# Patient Record
Sex: Female | Born: 1950
Health system: Southern US, Community
[De-identification: ages and names within clinical notes are randomized; demographics above are authoritative.]

## PROBLEM LIST (undated history)

## (undated) DIAGNOSIS — I739 Peripheral vascular disease, unspecified: Secondary | ICD-10-CM

## (undated) DIAGNOSIS — F32A Depression, unspecified: Secondary | ICD-10-CM

## (undated) DIAGNOSIS — R87619 Unspecified abnormal cytological findings in specimens from cervix uteri: Secondary | ICD-10-CM

## (undated) DIAGNOSIS — F329 Major depressive disorder, single episode, unspecified: Secondary | ICD-10-CM

## (undated) DIAGNOSIS — N189 Chronic kidney disease, unspecified: Secondary | ICD-10-CM

## (undated) DIAGNOSIS — R4781 Slurred speech: Secondary | ICD-10-CM

## (undated) DIAGNOSIS — D649 Anemia, unspecified: Secondary | ICD-10-CM

## (undated) DIAGNOSIS — E119 Type 2 diabetes mellitus without complications: Secondary | ICD-10-CM

## (undated) DIAGNOSIS — I82409 Acute embolism and thrombosis of unspecified deep veins of unspecified lower extremity: Secondary | ICD-10-CM

## (undated) DIAGNOSIS — E785 Hyperlipidemia, unspecified: Secondary | ICD-10-CM

## (undated) DIAGNOSIS — I10 Essential (primary) hypertension: Secondary | ICD-10-CM

## (undated) HISTORY — DX: Unspecified abnormal cytological findings in specimens from cervix uteri: R87.619

## (undated) HISTORY — DX: Type 2 diabetes mellitus without complications: E11.9

## (undated) HISTORY — DX: Hyperlipidemia, unspecified: E78.5

## (undated) HISTORY — DX: Chronic kidney disease, unspecified: N18.9

## (undated) HISTORY — DX: Slurred speech: R47.81

## (undated) HISTORY — PX: ANGIOPLASTY / STENTING FEMORAL: SUR30

---
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
## 1978-01-21 HISTORY — PX: CARPAL TUNNEL RELEASE: SHX101

## 1985-01-21 HISTORY — PX: CHOLECYSTECTOMY: SHX55

## 1986-01-21 HISTORY — PX: BRAIN MENINGIOMA EXCISION: SHX576

## 1987-01-22 HISTORY — PX: BREAST BIOPSY: SHX20

## 2000-01-23 LAB — HM COLONOSCOPY: HM Colonoscopy: NORMAL

## 2007-01-22 HISTORY — PX: KNEE ARTHROPLASTY: SHX992

## 2010-10-17 ENCOUNTER — Ambulatory Visit: Payer: Self-pay | Admitting: Family Medicine

## 2010-10-19 ENCOUNTER — Ambulatory Visit: Payer: Self-pay | Admitting: Internal Medicine

## 2011-02-04 ENCOUNTER — Ambulatory Visit: Payer: Self-pay | Admitting: Internal Medicine

## 2011-02-12 LAB — HM MAMMOGRAPHY: HM MAMMO: NORMAL

## 2011-05-15 ENCOUNTER — Ambulatory Visit: Payer: Self-pay | Admitting: Vascular Surgery

## 2011-05-15 LAB — CREATININE, SERUM: Creatinine: 1.54 mg/dL — ABNORMAL HIGH (ref 0.60–1.30)

## 2011-05-21 ENCOUNTER — Ambulatory Visit: Payer: Self-pay | Admitting: Vascular Surgery

## 2011-05-21 LAB — BASIC METABOLIC PANEL
BUN: 18 mg/dL (ref 7–18)
Co2: 28 mmol/L (ref 21–32)
Creatinine: 0.98 mg/dL (ref 0.60–1.30)
EGFR (African American): >60
EGFR (Non-African Amer.): >60
Glucose: 112 mg/dL — ABNORMAL HIGH (ref 65–99)
Osmolality: 280 (ref 275–301)
Potassium: 4.9 mmol/L (ref 3.5–5.1)
Sodium: 139 mmol/L (ref 136–145)

## 2012-04-14 ENCOUNTER — Ambulatory Visit: Payer: Self-pay | Admitting: Vascular Surgery

## 2012-04-14 LAB — BASIC METABOLIC PANEL
BUN: 27 mg/dL — ABNORMAL HIGH (ref 7–18)
Co2: 27 mmol/L (ref 21–32)
EGFR (African American): 59 — ABNORMAL LOW
Osmolality: 280 (ref 275–301)
Sodium: 137 mmol/L (ref 136–145)

## 2013-04-27 ENCOUNTER — Ambulatory Visit: Payer: Self-pay | Admitting: Vascular Surgery

## 2013-04-27 LAB — BASIC METABOLIC PANEL
ANION GAP: 6 — AB (ref 7–16)
BUN: 21 mg/dL — ABNORMAL HIGH (ref 7–18)
CHLORIDE: 108 mmol/L — AB (ref 98–107)
CO2: 25 mmol/L (ref 21–32)
Calcium, Total: 9.3 mg/dL (ref 8.5–10.1)
Creatinine: 1.28 mg/dL (ref 0.60–1.30)
EGFR (African American): 52 — ABNORMAL LOW
GFR CALC NON AF AMER: 44 — AB
GLUCOSE: 137 mg/dL — AB (ref 65–99)
OSMOLALITY: 283 (ref 275–301)
POTASSIUM: 4.2 mmol/L (ref 3.5–5.1)
Sodium: 139 mmol/L (ref 136–145)

## 2014-01-10 ENCOUNTER — Ambulatory Visit: Payer: Self-pay | Admitting: Internal Medicine

## 2014-01-10 LAB — BASIC METABOLIC PANEL
BUN: 19 mg/dL (ref 4–21)
CREATININE: 1.4 mg/dL — AB (ref <=1.1)

## 2014-01-10 LAB — LIPID PANEL
CHOLESTEROL: 171 mg/dL (ref 0–200)
HDL: 51 mg/dL (ref 35–70)
LDL CALC: 67 mg/dL
Triglycerides: 264 mg/dL — AB (ref 40–160)

## 2014-01-10 LAB — HEMOGLOBIN A1C: Hgb A1c MFr Bld: 6.4 % — AB (ref 4.0–6.0)

## 2014-01-10 LAB — CBC AND DIFFERENTIAL: HEMOGLOBIN: 14.3 g/dL (ref 12.0–16.0)

## 2014-01-10 LAB — TSH: TSH: 1.5 u[IU]/mL (ref <=5.90)

## 2014-01-31 DIAGNOSIS — M1990 Unspecified osteoarthritis, unspecified site: Secondary | ICD-10-CM | POA: Insufficient documentation

## 2014-05-13 NOTE — Op Note (Signed)
PATIENT NAME:  Misty Villarreal, Misty Villarreal MR#:  [id number] DATE OF BIRTH:  07-01-1950  DATE OF PROCEDURE:  04/14/2012  PREOPERATIVE DIAGNOSIS: Atherosclerotic occlusive disease, bilateral lower extremities with rest pain of the left lower extremity.   POSTOPERATIVE DIAGNOSIS: Atherosclerotic occlusive disease of bilateral lower extremities with rest pain of the right lower extremity.   PROCEDURES PERFORMED: 1.  Abdominal aortogram.  2.  Left lower extremity distal runoff, third order catheter placement.  3.  Percutaneous transluminal angioplasty of the left superficial femoral artery.   SURGEON: Katha Cabal, M.D.   SEDATION:  Versed and fentanyl IV. Continuous ECG, pulse oximetry and cardiopulmonary monitoring is performed throughout the entire procedure by the interventional radiology nurse.   TOTAL SEDATION TIME:  One hour, 20 minutes.   ACCESS: 6 French sheath, right superficial femoral artery.   FLUOROSCOPY TIME: 8.8 minutes.   CONTRAST USED: 110 mL Isovue.   INDICATIONS: The patient presented with increasing pain in her lower extremity and worsening of her noninvasive studies as well as deterioration of her physical examination. She wished for improvement in her perfusion and noted not only pains during the evening, but also limitation of her abilities to perform her daily activities. Risks and benefits for angiography and intervention were reviewed. All questions answered. The patient agrees to proceed.   DESCRIPTION OF PROCEDURE: The patient is taken to special procedures and placed in the supine position. After adequate sedation is achieved, both groins are prepped and draped in a sterile fashion. Ultrasound is placed in a sterile sleeve. Ultrasound is utilized secondary to lack of appropriate landmarks and to avoid vascular injury. Under direct ultrasound visualization, the common femoral artery is identified and the femoral artery and the femoral bifurcation are also identified. The  site above the apparent bifurcation is selected and access is obtained with a micropuncture needle. Images recorded for the permanent record. A J-wire is advanced followed by a 5 [place] sheath and 5 French pigtail catheter. Pigtail catheter is positioned in the level of T12. An AP projection of the aorta is obtained. Pigtail catheter is then repositioned and an RAO view of the pelvis is obtained. A stiff angled Glidewire and pigtail catheter are used to cross the bifurcation and negotiated down into the common femoral and subsequently the superficial femoral artery. Distal runoff is then obtained. After review of the images, 4000 units of heparin is given. Stiff angled Glidewire is reintroduced and a 6 [place] Ansel sheath is advanced and exchange for the 5 [place] sheath up and over the bifurcation and the tip of the Ansel sheath is positioned in the mid common femoral on the left. Straight glide catheter and Glidewire are then negotiated through the multiple diffuse lesions throughout the left SFA, including a 3 cm occlusion. The catheter is advanced down into the mid popliteal. Hand injection of contrast through the catheter is then utilized to complete distal runoff. The posterior tibial appears patent to the foot. Anterior tibial is also patent to the foot, but has significantly  slower flow then the posterior tibial. The peroneal is quite small and not likely to contribute significantly to the distal perfusion.   A wire is then reintroduced and initially a 4 x 25 [place] balloon is used to angioplasty the SFA beginning at the level of the femoral condyles and working all the way to back to the common femoral. Two separate inflations were required. Follow-up demonstrates that the recanalization is under sized and a 5 x 25 [place] balloon is advanced  across the lesions, inflated to 16 atmospheres. Inflations are for one minute each. Follow-up angiography demonstrates that there is now excellent revascularization.  There are 2 areas; one more proximally and one in the distal SFA proximal popliteal which do have dissection. They are not flow limiting and in fact, oblique views both left and right are obtained of these areas to ensure that the perfusion is adequate.   The sheath is then pulled into the right and an oblique image is obtained. This appears to show that the stick is actually in the SFA, not the common femoral. The wire is introduced and the sheath is slowly backed out puffing contrast and ultimately the point of extravasation is located at the SFA and not the common femoral. Therefore, the sheath is reintroduced using the dilator over the wire. The wire and dilator are removed and ACT is obtained, which is 170. The patient is then brought out to the holding area and the sheath is removed and manual pressure is held for 20 minutes. There are no additional complications.   INTERPRETATION: Initial views demonstrate the aorta is patent. There is moderate disease at the proximal right common iliac; however, this does not appear to achieve hemodynamic significance. The left common iliac is widely patent. Previously placed stent on the right iliac system is widely patent, as is the external iliac on the left. The common and external iliac arteries are widely patent.   The left common femoral and profunda femoris are patent and the profunda collateralize is moderately well. Superficial femoral artery is patent in its proximal one third, but quite small with areas of near occlusion in the midportion. It does include over several segments. There is a previously placed stent at the level of Hunter's canal and this is also heavily restenotic. The proximal popliteal is also diseased; however, the mid and distal popliteal are widely patent. Trifurcation as noted above. The anterior tibial,  posterior tibial are patent to the foot, although the flow was more rapid through the posterior tibial and this appears to be the  dominant vessel. The peroneal is small and does not contribute significantly.   Following angioplasty to 4 mm, the SFA is under sized and therefore a 5 mm dilatation is performed. This achieves an excellent appearance to the SFA throughout its entire course including the origin and the previously placed stented segment. There are two areas of dissection, but these not flow-limiting and multiple oblique views are obtained to ensure this.   SUMMARY: Successful revascularization of the left lower extremity with recanalization of the superficial femoral artery as described above.    ____________________________ Katha Cabal, MD ggs:cc D: 04/15/2012 21:00:16 ET T: 04/15/2012 21:42:25 ET JOB#: [id number]  cc: Katha Cabal, MD, <Dictator> Halina Maidens, MD Katha Cabal MD ELECTRONICALLY SIGNED 04/21/2012 17:20

## 2014-05-14 NOTE — Op Note (Signed)
PATIENT NAME:  Misty, Villarreal MR#:  [id number] DATE OF BIRTH:  30-Oct-1950  DATE OF PROCEDURE:  04/27/2013  PREOPERATIVE DIAGNOSES:  1.  Atherosclerotic occlusive disease, bilateral lower extremities with claudication, left lower extremity.  2.  Complication of vascular device with in-stent restenosis, left superficial femoral artery.  3.  Hypertension.  4.  Hypercholesterolemia.   POSTOPERATIVE DIAGNOSES: 1.  Atherosclerotic occlusive disease, bilateral lower extremities with claudication, left lower extremity.  2.  Complication of vascular device with in-stent restenosis, left superficial femoral artery.  3.  Hypertension.  4.  Hypercholesterolemia.   PROCEDURES PERFORMED:  1.  Abdominal aortogram.  2.  Left lower extremity distal runoff, third order catheter placement.   SURGEON: Hortencia Pilar, M.D.   SEDATION: Versed 5 mg plus fentanyl 200 mcg administered IV. Continuous ECG, pulse oximetry and cardiopulmonary monitoring is performed throughout the entire procedure by the interventional radiology nurse. Total sedation time is 1 hour.   ACCESS: A 6-French sheath, right common femoral artery.   FLUOROSCOPY TIME: 19.6 minutes.   CONTRAST USED: Isovue 90 mL.   INDICATIONS: Misty Villarreal is a 64 year old woman, who presents for her followup at the office noting some moderate claudication. She does acknowledge lifestyle limitations, but denies rest pain. There are no open wounds sores. Noninvasive studies demonstrated a high-grade in-stent restenosis. There was suggestion of a short segment very focal occlusion within the stent, but continuous flow distally. Risks and benefits for angiography with the hope for intervention and salvage of the existing previous intervention was reviewed. All questions answered. The patient agreed to proceed.   DESCRIPTION OF PROCEDURE: The patient is taken to special procedures and placed in the supine position. After adequate sedation is achieved, both  groins are prepped and draped in sterile fashion. Ultrasound is placed in a sterile sleeve. Ultrasound is utilized secondary to a lack of appropriate landmarks and to avoid vascular injury. Under direct visualization, the common femoral artery is identified. It is scanned down to demonstrate the femoral bifurcation and then scanned more proximally noting some posterior plaque. An area just above or just proximal to the posterior plaque is selected. Lidocaine 1% is infiltrated under ultrasound guidance, and then access is obtained under direct ultrasound visualization. The artery is echolucent and pulsatile indicating patency and an image is recorded. Microwire is then advanced by micro sheath, J-wire followed by a 5-French sheath. On fluoroscopy, the wire catheter is noted to go into a side branch and the short J-wire is then exchanged for a stiff angled Glidewire. A second floppy Glidewire is then advanced through the 5-French sheath and the 5-French sheath slowly pulled back until it is once again in the common femoral artery and the floppy Glidewire is negotiated under fluoroscopy into the aorta. Stiff angled Glidewire is then removed. The dilator is then introduced over the floppy guidewire and the sheath is advanced so that the tip is in the external iliac artery in the proper location. Pigtail catheter is then advanced over the Glidewire and positioned at the level of T12. AP projection of the aorta is obtained.   The pigtail catheter is repositioned to above the bifurcation and bilateral oblique views of the pelvis are obtained.   Using the pigtail catheter stiff angled Glidewire, the aortic bifurcation is crossed and the catheter is advanced down to the external iliac where an LAO projection is obtained. Catheter and wire combination is then negotiated into, what is essentially, a string sign of the SFA, which begins at the origin.  The catheter itself is occlusive within the SFA and distal runoff is  obtained.   Imaging carried more distally demonstrates that within the stent at approximately the midportion, there is occlusion, however unlike the ultrasound this occlusion extends down to the level of the tibial plateau essentially for the proximal two thirds of the popliteal. The mid distal popliteal is reconstituted and there is three-vessel runoff to the foot at the level of the trifurcation dominant vessel to the foot is the posterior tibial. Anterior tibial is poorly visualized at the level of the ankle.   A stiff angled Glidewire is reintroduced and the pigtail catheter is removed. A 5-French sheath is exchanged for a 6-French Ansell and a straight slip catheter is then utilized. Attempts at crossing the occlusion are made with angled Glidewire, Magic torque wire, straight slip catheter as well as a straight CSI catheter. Unfortunately, the true lumen could not be re-entered. Hand injection of contrast from the sheath in the common femoral location demonstrates the profunda collaterals are all well preserved and given her claudication symptoms without evidence of tissue loss, no further interventions or attempts at crossing are made.    The sheath is pulled back into the right common iliac artery where magnified oblique views of the right common iliac are obtained. There is  plaque noted at its origin, but this represents approximately a 30% stenosis and given her distal disease bilaterally and a hemodynamically significant lesion, I did not feel placing a stent at this location and potentially barring future interventions was appropriate at this time.   Oblique view of the right groin is then obtained and a StarClose device deployed successfully. There are no immediate complications.   INTERPRETATION: The abdominal aorta is opacified with a bolus injection of contrast. There is diffuse calcifications noted, but there are no hemodynamically significant stenoses. On the initial AP views, there does  appear to be a greater than 60% narrowing of the right common iliac at its origin. The left common iliac appears widely patent. Later in the case, magnified imaging demonstrates there is a calcified lesion here, but it does not appear to be hemodynamically significant.   The external iliac arteries appear patent bilaterally.   The left common femoral and profunda femoris are widely patent. Superficial femoral artery has a string sign measuring proximally 1 mm to 1.5 mm throughout its entire course beginning at the origin and extending down to the mid stent, where there is an occlusion that extends over the course of approximately 6 to 8 cm. Distally, the popliteal does reconstitute and the trifurcation is patent. The posterior tibial is widely patent down to the foot and fills the plantar arch and measures 2 to 2.5 mm throughout its course and is the dominant vessel. Anterior tibial is patent proximally, but then is poorly visualized distally. It does not appear to fill the dorsalis pedis well. Peroneal is patent down to the ankle.   SUMMARY: Occlusion of the distal superficial femoral artery and proximal popliteal. Unsuccessful attempt at crossing with catheter and wire the occluded segment. Given her claudication symptoms, no further interventions or surgery at this time; however, if her condition does worsen or she develops rest pain or tissue loss, then reattempting crossing is feasible; however, femoral to below-knee popliteal bypass is also a good option in the face of more extreme symptoms.   ____________________________ Katha Cabal, MD ggs:aw D: 04/27/2013 09:58:16 ET T: 04/27/2013 10:20:09 ET JOB#: [id number]  cc: Katha Cabal, MD, <Dictator>  Halina Maidens, MD Katha Cabal MD ELECTRONICALLY SIGNED 05/11/2013 11:16

## 2014-05-15 NOTE — Op Note (Signed)
PATIENT NAME:  Misty Villarreal, Misty Villarreal MR#:  [id number] DATE OF BIRTH:  December 21, 1950  DATE OF PROCEDURE:  05/21/2011  PREOPERATIVE DIAGNOSIS: Atherosclerotic occlusive disease of bilateral lower extremities with rest pain and ischemic embolization of the left fifth toe.   POSTOPERATIVE DIAGNOSIS: Atherosclerotic occlusive disease of bilateral lower extremities with rest pain and ischemic embolization of the left fifth toe.   PROCEDURE PERFORMED:   1. Left lower extremity runoff, third order catheter placement.  2. Percutaneous transluminal angioplasty and stent placement, left superficial femoral artery.   SURGEON: Katha Cabal, MD   SEDATION: Versed 3 mg plus fentanyl 100 mcg administered IV. Continuous ECG, pulse oximetry and cardiopulmonary monitoring was performed throughout the entire procedure by the Interventional Radiology nurse. Total sedation time was 45 minutes.   ACCESS: 6-French sheath, right common femoral artery.   FLUOROSCOPY TIME:  3.4 minutes.   CONTRAST USED: Isovue 70 mL.   INDICATIONS: Misty Villarreal is a 64 year old woman who presented to the office with the abrupt onset of bluish discoloration and acute pain in the left fifth toe. Physical examination as well as CT angiography of the chest and abdomen did not demonstrate a culprit lesion, and therefore she is undergoing angiography of the left lower extremity. The risks and benefits were reviewed. All questions are answered. The patient agrees to proceed.   DESCRIPTION OF PROCEDURE: The patient is taken to the Special Procedures Suite and placed in the supine position. After adequate sedation is achieved, ultrasound is placed in a sterile sleeve. Ultrasound is utilized secondary to lack of appropriate landmarks to avoid vascular injury. The common femoral artery is identified. It is echolucent, homogeneous, and pulsatile, indicating patency. Image is recorded for the permanent record. Under direct ultrasound visualization, a  micropuncture needle is inserted, a microwire followed by a micro sheath. A J-wire is then advanced with a pigtail catheter. The pigtail catheter is used to hook the aortic bifurcation, and the J-wire is advanced along with the pigtail catheter into the proximal SFA. Oblique view of the left groin is then obtained demonstrating the common femoral and the femoral bifurcation. The image intensifier is then returned to the AP projection and distal runoff is obtained. Approximately 65 to 75% stenosis within significant ulceration is identified in the SFA at Hunter's canal, and 3000 units of heparin is given. A Magic Torque Wire is then advanced across the lesion with the tip positioned in the distal popliteal, and a 6 x 60 LifeStent is deployed across this area and postdilated to 5 mm. A second angioplasty is performed slightly more proximally but no stent is deployed at this location.   Follow-up angiography demonstrates a widely patent SFA with minimal evidence of atherosclerotic plaque formation and no evidence of residual stenosis at the treated site. Distal runoff is three vessels down to the foot.   The sheath is pulled back into the right side and a StarClose device deployed successfully after an oblique image was obtained. There were no immediate complications.   INTERPRETATION: Left lower extremity distal runoff is widely patent with the significant exception of the ulcerated plaque in Hunter's canal. This is treated with angioplasty and stent placement using an Edwards LifeStent 6 x 60. Distal runoff is preserved.   SUMMARY: Successful treatment of ulcerated lesion which is the most probable cause of the embolic event to the patient's fifth toe on the left foot.    ____________________________ Katha Cabal, MD ggs:cbb D: 05/21/2011 18:38:51 ET T: 05/22/2011 10:00:28 ET JOB#: [id number]  cc: Katha Cabal, MD, <Dictator> Halina Maidens, MD Katha Cabal MD ELECTRONICALLY SIGNED  05/31/2011 7:52

## 2014-07-05 DIAGNOSIS — M4316 Spondylolisthesis, lumbar region: Secondary | ICD-10-CM | POA: Insufficient documentation

## 2014-09-01 ENCOUNTER — Encounter: Payer: Self-pay | Admitting: Internal Medicine

## 2014-09-01 ENCOUNTER — Other Ambulatory Visit: Payer: Self-pay | Admitting: Internal Medicine

## 2014-09-01 DIAGNOSIS — E139 Other specified diabetes mellitus without complications: Secondary | ICD-10-CM | POA: Insufficient documentation

## 2014-09-01 DIAGNOSIS — E119 Type 2 diabetes mellitus without complications: Secondary | ICD-10-CM | POA: Insufficient documentation

## 2014-09-01 DIAGNOSIS — R87619 Unspecified abnormal cytological findings in specimens from cervix uteri: Secondary | ICD-10-CM

## 2014-09-01 DIAGNOSIS — E785 Hyperlipidemia, unspecified: Secondary | ICD-10-CM

## 2014-09-01 DIAGNOSIS — I8390 Asymptomatic varicose veins of unspecified lower extremity: Secondary | ICD-10-CM | POA: Insufficient documentation

## 2014-09-01 DIAGNOSIS — E1151 Type 2 diabetes mellitus with diabetic peripheral angiopathy without gangrene: Secondary | ICD-10-CM

## 2014-09-01 DIAGNOSIS — E1169 Type 2 diabetes mellitus with other specified complication: Secondary | ICD-10-CM | POA: Insufficient documentation

## 2014-09-01 DIAGNOSIS — N289 Disorder of kidney and ureter, unspecified: Secondary | ICD-10-CM | POA: Insufficient documentation

## 2014-09-01 DIAGNOSIS — G629 Polyneuropathy, unspecified: Secondary | ICD-10-CM | POA: Insufficient documentation

## 2014-09-01 DIAGNOSIS — I10 Essential (primary) hypertension: Secondary | ICD-10-CM | POA: Insufficient documentation

## 2014-09-01 HISTORY — DX: Unspecified abnormal cytological findings in specimens from cervix uteri: R87.619

## 2014-09-13 ENCOUNTER — Other Ambulatory Visit: Payer: Self-pay | Admitting: Internal Medicine

## 2014-09-13 DIAGNOSIS — I739 Peripheral vascular disease, unspecified: Secondary | ICD-10-CM | POA: Insufficient documentation

## 2014-09-13 DIAGNOSIS — N183 Chronic kidney disease, stage 3 unspecified: Secondary | ICD-10-CM | POA: Insufficient documentation

## 2014-09-29 ENCOUNTER — Encounter: Payer: Self-pay | Admitting: Internal Medicine

## 2014-09-29 ENCOUNTER — Ambulatory Visit (INDEPENDENT_AMBULATORY_CARE_PROVIDER_SITE_OTHER): Payer: BLUE CROSS/BLUE SHIELD | Admitting: Internal Medicine

## 2014-09-29 VITALS — BP 122/64 | HR 76 | Ht 62.0 in | Wt 177.8 lb

## 2014-09-29 DIAGNOSIS — I10 Essential (primary) hypertension: Secondary | ICD-10-CM | POA: Diagnosis not present

## 2014-09-29 DIAGNOSIS — E1159 Type 2 diabetes mellitus with other circulatory complications: Secondary | ICD-10-CM

## 2014-09-29 DIAGNOSIS — M79641 Pain in right hand: Secondary | ICD-10-CM | POA: Diagnosis not present

## 2014-09-29 DIAGNOSIS — E1151 Type 2 diabetes mellitus with diabetic peripheral angiopathy without gangrene: Secondary | ICD-10-CM

## 2014-09-29 DIAGNOSIS — E784 Other hyperlipidemia: Secondary | ICD-10-CM

## 2014-09-29 DIAGNOSIS — E7849 Other hyperlipidemia: Secondary | ICD-10-CM

## 2014-09-29 DIAGNOSIS — N183 Chronic kidney disease, stage 3 unspecified: Secondary | ICD-10-CM

## 2014-09-29 NOTE — Progress Notes (Signed)
Date:  09/29/2014   Name:  Misty Villarreal   DOB:  [birth year]   MRN:  [id number]   Chief Complaint: Diabetes; Hypertension; and Hyperlipidemia Diabetes She presents for her follow-up diabetic visit. She has type 2 diabetes mellitus. Her disease course has been improving. Pertinent negatives for hypoglycemia include no headaches or tremors. Pertinent negatives for diabetes include no chest pain, no fatigue and no weakness. Current diabetic treatment includes oral agent (dual therapy). She is following a diabetic diet. There is no change in her home blood glucose trend. Her breakfast blood glucose is taken between 7-8 am. Her breakfast blood glucose range is generally 110-130 mg/dl. An ACE inhibitor/angiotensin II receptor blocker is being taken.  Hypertension This is a chronic problem. The current episode started more than 1 year ago. The problem is unchanged. The problem is controlled. Pertinent negatives include no chest pain, headaches, orthopnea, palpitations or shortness of breath. The current treatment provides significant improvement. There are no compliance problems.  Identifiable causes of hypertension include chronic renal disease.  Hyperlipidemia This is a chronic problem. The current episode started more than 1 year ago. The problem is controlled. Recent lipid tests were reviewed and are normal. Exacerbating diseases include chronic renal disease and diabetes. Pertinent negatives include no chest pain or shortness of breath.  Hand Pain  There was no injury mechanism. The pain is present in the right fingers. The quality of the pain is described as aching (And numbness. She also has some thickening of the palmar fascia beneath the ring finger. She is decreased sensation in the distal fourth and fifth fingers.). The pain does not radiate. The pain has been constant since the incident. Associated symptoms include numbness. Pertinent negatives include no chest pain.     Review of  Systems:  Review of Systems  Constitutional: Negative for fever, chills and fatigue.  Respiratory: Negative for cough, choking and shortness of breath.   Cardiovascular: Negative for chest pain, palpitations, orthopnea and leg swelling.  Genitourinary: Frequency: in lateral right hand   Musculoskeletal: Positive for arthralgias.  Neurological: Positive for numbness. Negative for tremors, weakness and headaches.    Patient Active Problem List   Diagnosis Date Noted  . Chronic kidney disease (CKD), stage III (moderate) 09/13/2014  . Angiopathy, peripheral 09/13/2014  . Familial multiple lipoprotein-type hyperlipidemia 09/01/2014  . Clinical depression 09/01/2014  . Neuropathy 09/01/2014  . Phlebectasia 09/01/2014  . Abnormal glandular Papanicolaou smear of cervix 09/01/2014  . Impaired renal function 09/01/2014  . DM (diabetes mellitus), type 2 with peripheral vascular complications 95/62/1308  . Essential (primary) hypertension 09/01/2014  . Arthritis, degenerative 01/31/2014    Prior to Admission medications   Medication Sig Start Date End Date Taking? Authorizing Provider  aspirin 81 MG tablet Take 1 tablet by mouth daily.   Yes Historical Provider, MD  clopidogrel (PLAVIX) 75 MG tablet Take 1 tablet by mouth daily.   Yes Historical Provider, MD  fexofenadine (ALLEGRA) 180 MG tablet Take 180 mg by mouth daily.   Yes Historical Provider, MD  lisinopril-hydrochlorothiazide (PRINZIDE,ZESTORETIC) 20-12.5 MG per tablet Take 1 tablet by mouth daily. 01/10/14  Yes Historical Provider, MD  metFORMIN (GLUCOPHAGE) 500 MG tablet TAKE 1 TABLET TWICE A DAY 09/01/14  Yes Glean Hess, MD  MULTIPLE VITAMIN PO Take by mouth.   Yes Historical Provider, MD  simvastatin (ZOCOR) 80 MG tablet TAKE 1 TABLET AT BEDTIME 09/01/14  Yes Glean Hess, MD    Allergies  Allergen Reactions  . Amoxicillin-Pot  Clavulanate Diarrhea  . Tetracycline     Other reaction(s): emesis  . Cefaclor Rash  .  Cephalexin Rash  . Sulfa Antibiotics Rash    Other reaction(s): emesis    Past Surgical History  Procedure Laterality Date  . Brain meningioma excision  1988  . Knee arthroplasty Right 2009  . Carpal tunnel release Left 1980  . Cholecystectomy  1987  . Breast biopsy Right 1989    benign  . Angioplasty / stenting femoral Left 2014, 2013    Social History  Substance Use Topics  . Smoking status: Former Research scientist (life sciences)  . Smokeless tobacco: Former Systems developer    Quit date: 12/21/2013  . Alcohol Use: 1.2 oz/week    2 Standard drinks or equivalent per week     Medication list has been reviewed and updated.  Physical Examination:  Physical Exam  Constitutional: She is oriented to person, place, and time. She appears well-developed. No distress.  HENT:  Head: Normocephalic and atraumatic.  Eyes: Conjunctivae are normal. Right eye exhibits no discharge. Left eye exhibits no discharge. No scleral icterus.  Cardiovascular: Regular rhythm and S1 normal.   Pulses:      Dorsalis pedis pulses are 1+ on the right side, and 1+ on the left side.  Pulmonary/Chest: Effort normal and breath sounds normal. No respiratory distress. She has no wheezes.  Musculoskeletal: Normal range of motion.       Arms: Neurological: She is alert and oriented to person, place, and time.  Skin: Skin is warm and dry. No rash noted.  Psychiatric: She has a normal mood and affect. Her behavior is normal. Thought content normal.    BP 122/64 mmHg  Pulse 76  Ht 5\' 2"  (1.575 m)  Wt 177 lb 12.8 oz (80.65 kg)  BMI 32.51 kg/m2  Assessment and Plan: 1. Essential (primary) hypertension Controlled continue current medications  2. DM (diabetes mellitus), type 2 with peripheral vascular complications Doing well on oral agents. Patient encouraged to do regular exercise such as walking to improve circulation - Hemoglobin A1c  3. Chronic kidney disease (CKD), stage III (moderate) Continue to follow-up with [facility]  nephrology  4. Familial multiple lipoprotein-type hyperlipidemia Continue statin therapy  5. Hand pain, right Refer to orthopedic hand specialist   Halina Maidens, MD [facility]  09/29/2014

## 2014-09-29 NOTE — Patient Instructions (Signed)
Call [facility])   Hand specialist  - Dr. Astrid Divine or who ever is available

## 2014-09-30 LAB — HEMOGLOBIN A1C
Est. average glucose Bld gHb Est-mCnc: 146 mg/dL
HEMOGLOBIN A1C: 6.7 % — AB (ref 4.8–5.6)

## 2014-11-28 ENCOUNTER — Other Ambulatory Visit: Payer: Self-pay | Admitting: Internal Medicine

## 2015-01-30 ENCOUNTER — Ambulatory Visit: Payer: BLUE CROSS/BLUE SHIELD | Admitting: Internal Medicine

## 2015-02-02 ENCOUNTER — Encounter: Payer: Self-pay | Admitting: Internal Medicine

## 2015-02-02 ENCOUNTER — Ambulatory Visit (INDEPENDENT_AMBULATORY_CARE_PROVIDER_SITE_OTHER): Payer: Medicare Other | Admitting: Internal Medicine

## 2015-02-02 VITALS — BP 104/62 | HR 60 | Ht 62.0 in | Wt 180.0 lb

## 2015-02-02 DIAGNOSIS — E1151 Type 2 diabetes mellitus with diabetic peripheral angiopathy without gangrene: Secondary | ICD-10-CM | POA: Diagnosis not present

## 2015-02-02 DIAGNOSIS — Z1239 Encounter for other screening for malignant neoplasm of breast: Secondary | ICD-10-CM

## 2015-02-02 DIAGNOSIS — I10 Essential (primary) hypertension: Secondary | ICD-10-CM

## 2015-02-02 DIAGNOSIS — N183 Chronic kidney disease, stage 3 unspecified: Secondary | ICD-10-CM

## 2015-02-02 DIAGNOSIS — I739 Peripheral vascular disease, unspecified: Secondary | ICD-10-CM | POA: Insufficient documentation

## 2015-02-02 MED ORDER — LISINOPRIL-HYDROCHLOROTHIAZIDE 20-12.5 MG PO TABS: 1.0000 | ORAL_TABLET | Freq: Every day | ORAL | Status: DC

## 2015-02-02 NOTE — Progress Notes (Signed)
Date:  02/02/2015   Name:  Misty Villarreal   DOB:  1950/07/06   MRN:  [id number]   Chief Complaint: Diabetes and Hypertension Diabetes She presents for her follow-up diabetic visit. She has type 2 diabetes mellitus. Pertinent negatives for hypoglycemia include no headaches or tremors. Pertinent negatives for diabetes include no chest pain, no fatigue, no polydipsia and no polyuria. Diabetic complications include PVD. Current diabetic treatment includes oral agent (monotherapy). She is compliant with treatment all of the time. Her weight is stable. There is no change in her home blood glucose trend. Her breakfast blood glucose is taken between 7-8 am. Her breakfast blood glucose range is generally 130-140 mg/dl.  Hypertension This is a chronic problem. The current episode started today. The problem is unchanged. The problem is controlled (occasionally SBP < 100 but without symptoms). Pertinent negatives include no chest pain, headaches, palpitations or shortness of breath. Risk factors for coronary artery disease include diabetes mellitus. Past treatments include ACE inhibitors and diuretics. The current treatment provides significant improvement. Hypertensive end-organ damage includes kidney disease and PVD.  renal insufficiency - patient is followed closely by [facility] nephrology. She has an appointment later this month. Per her report her renal insufficiency has been stable. She is avoiding nonsteroidals and keeping her blood pressure under control.  Review of Systems  Constitutional: Negative for fever, appetite change, fatigue and unexpected weight change.  HENT: Negative for tinnitus and trouble swallowing.   Eyes: Negative for visual disturbance.  Respiratory: Negative for cough, chest tightness and shortness of breath.   Cardiovascular: Negative for chest pain, palpitations and leg swelling.  Gastrointestinal: Negative for abdominal pain.  Endocrine: Negative for polydipsia and polyuria.    Genitourinary: Negative for dysuria and hematuria.  Musculoskeletal: Negative for arthralgias.  Neurological: Negative for tremors, numbness and headaches.  Psychiatric/Behavioral: Negative for dysphoric mood.    Patient Active Problem List   Diagnosis Date Noted  . Peripheral arterial occlusive disease ([place]) 02/02/2015  . Chronic kidney disease (CKD), stage III (moderate) 09/13/2014  . Angiopathy, peripheral ([place]) 09/13/2014  . Familial multiple lipoprotein-type hyperlipidemia 09/01/2014  . Clinical depression 09/01/2014  . Neuropathy ([place]) 09/01/2014  . Phlebectasia 09/01/2014  . Abnormal glandular Papanicolaou smear of cervix 09/01/2014  . DM (diabetes mellitus), type 2 with peripheral vascular complications ([place]) [id number]  . Essential (primary) hypertension 09/01/2014  . Arthritis, degenerative 01/31/2014    Prior to Admission medications   Medication Sig Start Date End Date Taking? Authorizing Provider  aspirin 81 MG tablet Take 1 tablet by mouth daily.   Yes Historical Provider, MD  clopidogrel (PLAVIX) 75 MG tablet Take 1 tablet by mouth daily.   Yes Historical Provider, MD  fexofenadine (ALLEGRA) 180 MG tablet Take 180 mg by mouth daily.   Yes Historical Provider, MD  lisinopril-hydrochlorothiazide (PRINZIDE,ZESTORETIC) 20-12.5 MG per tablet Take 1 tablet by mouth daily. 01/10/14  Yes Historical Provider, MD  metFORMIN (GLUCOPHAGE) 500 MG tablet TAKE 1 TABLET TWICE A DAY 11/28/14  Yes Glean Hess, MD  MULTIPLE VITAMIN PO Take by mouth.   Yes Historical Provider, MD  simvastatin (ZOCOR) 80 MG tablet TAKE 1 TABLET AT BEDTIME 11/28/14  Yes Glean Hess, MD    Allergies  Allergen Reactions  . Amoxicillin-Pot Clavulanate Diarrhea  . Tetracycline     Other reaction(s): emesis  . Cefaclor Rash  . Cephalexin Rash  . Sulfa Antibiotics Rash    Other reaction(s): emesis    Past Surgical History  Procedure Laterality  Date  . Brain meningioma excision  1988  . Knee  arthroplasty Right 2009  . Carpal tunnel release Left 1980  . Cholecystectomy  1987  . Breast biopsy Right 1989    benign  . Angioplasty / stenting femoral Left 2014, 2013    Social History  Substance Use Topics  . Smoking status: Former Research scientist (life sciences)  . Smokeless tobacco: Former Systems developer    Quit date: 12/21/2013  . Alcohol Use: 1.2 oz/week    2 Standard drinks or equivalent per week     Comment: occasional     Medication list has been reviewed and updated.   Physical Exam  Constitutional: She is oriented to person, place, and time. She appears well-developed and well-nourished. No distress.  HENT:  Head: Normocephalic and atraumatic.  Eyes: Conjunctivae are normal. Right eye exhibits no discharge. Left eye exhibits no discharge. No scleral icterus.  Neck: Normal range of motion. Neck supple. Carotid bruit is not present. No thyromegaly present.  Cardiovascular: Normal rate, regular rhythm and normal heart sounds.   Pulmonary/Chest: Effort normal and breath sounds normal. No respiratory distress.  Musculoskeletal: Normal range of motion.  Lymphadenopathy:    She has no cervical adenopathy.  Neurological: She is alert and oriented to person, place, and time. She has normal reflexes.  Skin: Skin is warm and dry. No rash noted.  Psychiatric: She has a normal mood and affect. Her speech is normal and behavior is normal. Thought content normal.    BP 104/62 mmHg  Pulse 60  Ht 5\' 2"  (1.575 m)  Wt 180 lb (81.647 kg)  BMI 32.91 kg/m2  Assessment and Plan: 1. Essential (primary) hypertension Controlled with occasionally low pressures but without symptoms Will not make any changes at this time - lisinopril-hydrochlorothiazide (PRINZIDE,ZESTORETIC) 20-12.5 MG tablet; Take 1 tablet by mouth daily.  Dispense: 90 tablet; Refill: 3  2. DM (diabetes mellitus), type 2 with peripheral vascular complications (HCC) Blood sugars are adequately controlled If A1c is higher will need to add other  agents - Hemoglobin A1c  3. Chronic kidney disease (CKD), stage III (moderate) Stable; followed by [facility] nephrology  4. Breast cancer screening - MM DIGITAL SCREENING BILATERAL; Future   Halina Maidens, MD [facility]  02/02/2015

## 2015-02-03 ENCOUNTER — Telehealth: Payer: Self-pay

## 2015-02-03 LAB — HEMOGLOBIN A1C
Est. average glucose Bld gHb Est-mCnc: 146 mg/dL
HEMOGLOBIN A1C: 6.7 % — AB (ref 4.8–5.6)

## 2015-02-03 NOTE — Telephone Encounter (Signed)
Spoke with pt. Pt. Advised of results and verbalized understanding. [facility]

## 2015-02-03 NOTE — Telephone Encounter (Signed)
-----   Message from Glean Hess, MD sent at 02/03/2015 12:11 PM EST ----- DM is stable.  Continue same medication.

## 2015-02-17 ENCOUNTER — Ambulatory Visit
Admission: RE | Admit: 2015-02-17 | Discharge: 2015-02-17 | Disposition: A | Discharge status: Home / Self Care | Payer: Medicare Other | Source: Ambulatory Visit | Attending: Internal Medicine | Admitting: Internal Medicine

## 2015-02-17 DIAGNOSIS — Z1231 Encounter for screening mammogram for malignant neoplasm of breast: Secondary | ICD-10-CM | POA: Insufficient documentation

## 2015-02-17 DIAGNOSIS — Z1239 Encounter for other screening for malignant neoplasm of breast: Secondary | ICD-10-CM

## 2015-05-10 ENCOUNTER — Encounter: Payer: Self-pay | Admitting: Internal Medicine

## 2015-05-10 DIAGNOSIS — E1129 Type 2 diabetes mellitus with other diabetic kidney complication: Secondary | ICD-10-CM | POA: Insufficient documentation

## 2015-06-21 ENCOUNTER — Encounter: Payer: Self-pay | Admitting: Internal Medicine

## 2015-06-21 ENCOUNTER — Ambulatory Visit (INDEPENDENT_AMBULATORY_CARE_PROVIDER_SITE_OTHER): Payer: Medicare Other | Admitting: Internal Medicine

## 2015-06-21 VITALS — BP 140/90 | HR 74 | Resp 16 | Ht 62.0 in | Wt 187.0 lb

## 2015-06-21 DIAGNOSIS — I890 Lymphedema, not elsewhere classified: Secondary | ICD-10-CM | POA: Insufficient documentation

## 2015-06-21 DIAGNOSIS — I10 Essential (primary) hypertension: Secondary | ICD-10-CM

## 2015-06-21 DIAGNOSIS — R600 Localized edema: Secondary | ICD-10-CM | POA: Diagnosis not present

## 2015-06-21 DIAGNOSIS — I479 Paroxysmal tachycardia, unspecified: Secondary | ICD-10-CM

## 2015-06-21 DIAGNOSIS — E784 Other hyperlipidemia: Secondary | ICD-10-CM | POA: Diagnosis not present

## 2015-06-21 DIAGNOSIS — E7849 Other hyperlipidemia: Secondary | ICD-10-CM

## 2015-06-21 DIAGNOSIS — E1151 Type 2 diabetes mellitus with diabetic peripheral angiopathy without gangrene: Secondary | ICD-10-CM | POA: Diagnosis not present

## 2015-06-21 DIAGNOSIS — Z8679 Personal history of other diseases of the circulatory system: Secondary | ICD-10-CM | POA: Insufficient documentation

## 2015-06-21 MED ORDER — FUROSEMIDE 20 MG PO TABS
10.0000 mg | ORAL_TABLET | Freq: Every day | ORAL | Status: DC | PRN
Start: 2015-06-21 — End: 2016-12-03

## 2015-06-21 NOTE — Progress Notes (Signed)
Date:  06/21/2015   Name:  Misty Villarreal   DOB:  Feb 01, 1950   MRN:  [id number]   Chief Complaint: Hypertension and Edema Hypertension Pertinent negatives include no chest pain, headaches, palpitations or shortness of breath.  Diabetes She presents for her follow-up diabetic visit. She has type 2 diabetes mellitus. Pertinent negatives for hypoglycemia include no headaches or tremors. Associated symptoms include fatigue. Pertinent negatives for diabetes include no chest pain, no polydipsia and no polyuria.   Edema - she has intermittent mild swelling off and on but over the past few weeks it is worse and more persistent.  The edema improved overnight and she has frequent urination throughout the night.  Rapid HR - she has had 2 episodes of feeling funny and on pulse ox her HR is 160.  Both times it lasted only about 10 minutes and resolved alone.  She denies chest pain or SOB.  Review of Systems  Constitutional: Positive for fever, chills and fatigue. Negative for appetite change and unexpected weight change.  HENT: Negative for tinnitus and trouble swallowing.   Eyes: Negative for visual disturbance.  Respiratory: Negative for cough, chest tightness, shortness of breath and wheezing.   Cardiovascular: Positive for leg swelling. Negative for chest pain and palpitations.  Gastrointestinal: Negative for abdominal pain.  Endocrine: Negative for polydipsia and polyuria.  Genitourinary: Negative for dysuria and hematuria.  Musculoskeletal: Negative for arthralgias.  Skin: Negative for color change and rash.  Neurological: Negative for tremors, numbness and headaches.  Psychiatric/Behavioral: Negative for dysphoric mood.    Patient Active Problem List   Diagnosis Date Noted  . Type II diabetes mellitus with renal manifestations ([place]) 05/10/2015  . Peripheral arterial occlusive disease ([place]) 02/02/2015  . Chronic kidney disease (CKD), stage III (moderate) 09/13/2014  . Angiopathy,  peripheral ([place]) 09/13/2014  . Familial multiple lipoprotein-type hyperlipidemia 09/01/2014  . Clinical depression 09/01/2014  . Neuropathy ([place]) 09/01/2014  . Phlebectasia 09/01/2014  . Abnormal glandular Papanicolaou smear of cervix 09/01/2014  . DM (diabetes mellitus), type 2 with peripheral vascular complications ([place]) [id number]  . Essential (primary) hypertension 09/01/2014  . Arthritis, degenerative 01/31/2014    Prior to Admission medications   Medication Sig Start Date End Date Taking? Authorizing Provider  aspirin 81 MG tablet Take 1 tablet by mouth daily.   Yes Historical Provider, MD  clopidogrel (PLAVIX) 75 MG tablet Take 1 tablet by mouth daily.   Yes Historical Provider, MD  fexofenadine (ALLEGRA) 180 MG tablet Take 180 mg by mouth daily.   Yes Historical Provider, MD  lisinopril-hydrochlorothiazide (PRINZIDE,ZESTORETIC) 20-12.5 MG tablet Take 1 tablet by mouth daily. 02/02/15  Yes Glean Hess, MD  metFORMIN (GLUCOPHAGE) 500 MG tablet TAKE 1 TABLET TWICE A DAY 11/28/14  Yes Glean Hess, MD  MULTIPLE VITAMIN PO Take by mouth.   Yes Historical Provider, MD  simvastatin (ZOCOR) 80 MG tablet TAKE 1 TABLET AT BEDTIME 11/28/14  Yes Glean Hess, MD    Allergies  Allergen Reactions  . Amoxicillin-Pot Clavulanate Diarrhea  . Tetracycline     Other reaction(s): emesis  . Cefaclor Rash  . Cephalexin Rash  . Sulfa Antibiotics Rash    Other reaction(s): emesis    Past Surgical History  Procedure Laterality Date  . Brain meningioma excision  1988  . Knee arthroplasty Right 2009  . Carpal tunnel release Left 1980  . Cholecystectomy  1987  . Angioplasty / stenting femoral Left 2014, 2013  . Breast biopsy Right 1989  benign    Social History  Substance Use Topics  . Smoking status: Former Research scientist (life sciences)  . Smokeless tobacco: Former Systems developer    Quit date: 12/21/2013  . Alcohol Use: 1.2 oz/week    2 Standard drinks or equivalent per week     Comment: occasional      Medication list has been reviewed and updated.   Physical Exam  Constitutional: She is oriented to person, place, and time. She appears well-developed. No distress.  HENT:  Head: Normocephalic and atraumatic.  Cardiovascular: Normal rate, regular rhythm and normal heart sounds.   Pulses:      Dorsalis pedis pulses are 1+ on the right side, and 1+ on the left side.       Posterior tibial pulses are 1+ on the right side, and 1+ on the left side.  Pulmonary/Chest: Effort normal and breath sounds normal. No respiratory distress.  Abdominal: Soft.  Musculoskeletal: She exhibits edema (1+ both feet and ankles).  Lymphadenopathy:    She has no cervical adenopathy.  Neurological: She is alert and oriented to person, place, and time.  Skin: Skin is warm, dry and intact. No rash noted.  Psychiatric: She has a normal mood and affect. Her behavior is normal. Thought content normal.  Nursing note and vitals reviewed.   BP 140/90 mmHg  Pulse 74  Resp 16  Ht 5\' 2"  (1.575 m)  Wt 187 lb (84.823 kg)  BMI 34.19 kg/m2  SpO2 98%  Assessment and Plan: 1. Essential (primary) hypertension Fair control - CBC with Differential/Platelet  2. DM (diabetes mellitus), type 2 with peripheral vascular complications (HCC) Continue medication - Hemoglobin A1c - Comprehensive metabolic panel  3. Familial multiple lipoprotein-type hyperlipidemia On statin therapy  4. Localized edema Begin furosemide prn - TSH - furosemide (LASIX) 20 MG tablet; Take 0.5-1 tablets (10-20 mg total) by mouth daily as needed.  Dispense: 30 tablet; Refill: 3  5. Tachycardia, paroxysmal ([place]) Pt instructed to go to ER if this recurs  Halina Maidens, MD [facility]  06/21/2015

## 2015-06-21 NOTE — Patient Instructions (Signed)
DASH Eating Plan  DASH stands for "Dietary Approaches to Stop Hypertension." The DASH eating plan is a healthy eating plan that has been shown to reduce high blood pressure (hypertension). Additional health benefits may include reducing the risk of type 2 diabetes mellitus, heart disease, and stroke. The DASH eating plan may also help with weight loss.  WHAT DO I NEED TO KNOW ABOUT THE DASH EATING PLAN?  For the DASH eating plan, you will follow these general guidelines:  · Choose foods with a percent daily value for sodium of less than 5% (as listed on the food label).  · Use salt-free seasonings or herbs instead of table salt or sea salt.  · Check with your health care provider or pharmacist before using salt substitutes.  · Eat lower-sodium products, often labeled as "lower sodium" or "no salt added."  · Eat fresh foods.  · Eat more vegetables, fruits, and low-fat dairy products.  · Choose whole grains. Look for the word "whole" as the first word in the ingredient list.  · Choose fish and skinless chicken or turkey more often than red meat. Limit fish, poultry, and meat to 6 oz (170 g) each day.  · Limit sweets, desserts, sugars, and sugary drinks.  · Choose heart-healthy fats.  · Limit cheese to 1 oz (28 g) per day.  · Eat more home-cooked food and less restaurant, buffet, and fast food.  · Limit fried foods.  · Cook foods using methods other than frying.  · Limit canned vegetables. If you do use them, rinse them well to decrease the sodium.  · When eating at a restaurant, ask that your food be prepared with less salt, or no salt if possible.  WHAT FOODS CAN I EAT?  Seek help from a dietitian for individual calorie needs.  Grains  Whole grain or whole wheat bread. Brown rice. Whole grain or whole wheat pasta. Quinoa, bulgur, and whole grain cereals. Low-sodium cereals. Corn or whole wheat flour tortillas. Whole grain cornbread. Whole grain crackers. Low-sodium crackers.  Vegetables  Fresh or frozen vegetables  (raw, steamed, roasted, or grilled). Low-sodium or reduced-sodium tomato and vegetable juices. Low-sodium or reduced-sodium tomato sauce and paste. Low-sodium or reduced-sodium canned vegetables.   Fruits  All fresh, canned (in natural juice), or frozen fruits.  Meat and Other Protein Products  Ground beef (85% or leaner), grass-fed beef, or beef trimmed of fat. Skinless chicken or turkey. Ground chicken or turkey. Pork trimmed of fat. All fish and seafood. Eggs. Dried beans, peas, or lentils. Unsalted nuts and seeds. Unsalted canned beans.  Dairy  Low-fat dairy products, such as skim or 1% milk, 2% or reduced-fat cheeses, low-fat ricotta or cottage cheese, or plain low-fat yogurt. Low-sodium or reduced-sodium cheeses.  Fats and Oils  Tub margarines without trans fats. Light or reduced-fat mayonnaise and salad dressings (reduced sodium). Avocado. Safflower, olive, or canola oils. Natural peanut or almond butter.  Other  Unsalted popcorn and pretzels.  The items listed above may not be a complete list of recommended foods or beverages. Contact your dietitian for more options.  WHAT FOODS ARE NOT RECOMMENDED?  Grains  White bread. White pasta. White rice. Refined cornbread. Bagels and croissants. Crackers that contain trans fat.  Vegetables  Creamed or fried vegetables. Vegetables in a cheese sauce. Regular canned vegetables. Regular canned tomato sauce and paste. Regular tomato and vegetable juices.  Fruits  Dried fruits. Canned fruit in light or heavy syrup. Fruit juice.  Meat and Other Protein   Products  Fatty cuts of meat. Ribs, chicken wings, bacon, sausage, bologna, salami, chitterlings, fatback, hot dogs, bratwurst, and packaged luncheon meats. Salted nuts and seeds. Canned beans with salt.  Dairy  Whole or 2% milk, cream, half-and-half, and cream cheese. Whole-fat or sweetened yogurt. Full-fat cheeses or blue cheese. Nondairy creamers and whipped toppings. Processed cheese, cheese spreads, or cheese  curds.  Condiments  Onion and garlic salt, seasoned salt, table salt, and sea salt. Canned and packaged gravies. Worcestershire sauce. Tartar sauce. Barbecue sauce. Teriyaki sauce. Soy sauce, including reduced sodium. Steak sauce. Fish sauce. Oyster sauce. Cocktail sauce. Horseradish. Ketchup and mustard. Meat flavorings and tenderizers. Bouillon cubes. Hot sauce. Tabasco sauce. Marinades. Taco seasonings. Relishes.  Fats and Oils  Butter, stick margarine, lard, shortening, ghee, and bacon fat. Coconut, palm kernel, or palm oils. Regular salad dressings.  Other  Pickles and olives. Salted popcorn and pretzels.  The items listed above may not be a complete list of foods and beverages to avoid. Contact your dietitian for more information.  WHERE CAN I FIND MORE INFORMATION?  National Heart, Lung, and Blood Institute: www.nhlbi.nih.gov/health/health-topics/topics/dash/     This information is not intended to replace advice given to you by your health care provider. Make sure you discuss any questions you have with your health care provider.     Document Released: 12/27/2010 Document Revised: 01/28/2014 Document Reviewed: 11/11/2012  Elsevier Interactive Patient Education ©2016 [employer] Inc[employer]

## 2015-06-22 DIAGNOSIS — D649 Anemia, unspecified: Secondary | ICD-10-CM

## 2015-06-22 LAB — COMPREHENSIVE METABOLIC PANEL
ALBUMIN: 4.2 g/dL (ref 3.6–4.8)
ALK PHOS: 57 IU/L (ref 39–117)
ALT: 15 IU/L (ref 0–32)
AST: 16 IU/L (ref 0–40)
Albumin/Globulin Ratio: 2 (ref 1.2–2.2)
BUN / CREAT RATIO: 19 (ref 12–28)
BUN: 18 mg/dL (ref 8–27)
Bilirubin Total: 0.6 mg/dL (ref 0.0–1.2)
CO2: 23 mmol/L (ref 18–29)
CREATININE: 0.96 mg/dL (ref 0.57–1.00)
Calcium: 9 mg/dL (ref 8.7–10.3)
Chloride: 99 mmol/L (ref 96–106)
GFR calc Af Amer: 72 mL/min/{1.73_m2} (ref >59)
GFR calc non Af Amer: 62 mL/min/{1.73_m2} (ref >59)
GLOBULIN, TOTAL: 2.1 g/dL (ref 1.5–4.5)
Glucose: 100 mg/dL — ABNORMAL HIGH (ref 65–99)
Potassium: 4.4 mmol/L (ref 3.5–5.2)
SODIUM: 140 mmol/L (ref 134–144)
Total Protein: 6.3 g/dL (ref 6.0–8.5)

## 2015-06-22 LAB — CBC WITH DIFFERENTIAL/PLATELET
BASOS ABS: 0 10*3/uL (ref 0.0–0.2)
Basos: 1 %
EOS (ABSOLUTE): 0.2 10*3/uL (ref 0.0–0.4)
Eos: 3 %
Hematocrit: 32.3 % — ABNORMAL LOW (ref 34.0–46.6)
Hemoglobin: 10.8 g/dL — ABNORMAL LOW (ref 11.1–15.9)
IMMATURE GRANULOCYTES: 0 %
Immature Grans (Abs): 0 10*3/uL (ref 0.0–0.1)
Lymphocytes Absolute: 2 10*3/uL (ref 0.7–3.1)
Lymphs: 28 %
MCH: 29.6 pg (ref 26.6–33.0)
MCHC: 33.4 g/dL (ref 31.5–35.7)
MCV: 89 fL (ref 79–97)
MONOS ABS: 0.7 10*3/uL (ref 0.1–0.9)
Monocytes: 10 %
NEUTROS PCT: 58 %
Neutrophils Absolute: 4.2 10*3/uL (ref 1.4–7.0)
PLATELETS: 281 10*3/uL (ref 150–379)
RBC: 3.65 x10E6/uL — AB (ref 3.77–5.28)
RDW: 13.3 % (ref 12.3–15.4)
WBC: 7 10*3/uL (ref 3.4–10.8)

## 2015-06-22 LAB — HEMOGLOBIN A1C
ESTIMATED AVERAGE GLUCOSE: 151 mg/dL
Hgb A1c MFr Bld: 6.9 % — ABNORMAL HIGH (ref 4.8–5.6)

## 2015-06-22 LAB — TSH: TSH: 2.48 u[IU]/mL (ref 0.450–4.500)

## 2015-06-24 ENCOUNTER — Other Ambulatory Visit: Payer: Self-pay | Admitting: Internal Medicine

## 2015-06-24 DIAGNOSIS — D649 Anemia, unspecified: Secondary | ICD-10-CM | POA: Insufficient documentation

## 2015-06-24 LAB — IRON AND TIBC
Iron Saturation: 23 % (ref 15–55)
Iron: 71 ug/dL (ref 27–139)
TIBC: 314 ug/dL (ref 250–450)
UIBC: 243 ug/dL (ref 118–369)

## 2015-06-24 LAB — FERRITIN: FERRITIN: 25 ng/mL (ref 15–150)

## 2015-06-24 LAB — SPECIMEN STATUS REPORT

## 2015-06-28 ENCOUNTER — Telehealth: Payer: Self-pay

## 2015-06-28 DIAGNOSIS — D649 Anemia, unspecified: Secondary | ICD-10-CM

## 2015-06-28 NOTE — Telephone Encounter (Signed)
Patient on way to pick up stool test orders. [facility]

## 2015-06-28 NOTE — Telephone Encounter (Signed)
-----   Message from Glean Hess, MD sent at 06/24/2015 10:00 AM EDT ----- I placed an order for stool collection.  When patient arrives, just release order for her to go to [facility].

## 2015-07-01 ENCOUNTER — Other Ambulatory Visit: Payer: Self-pay | Admitting: Internal Medicine

## 2015-07-07 LAB — FECAL OCCULT BLOOD, IMMUNOCHEMICAL: Fecal Occult Bld: NEGATIVE

## 2015-07-10 ENCOUNTER — Other Ambulatory Visit: Payer: Self-pay | Admitting: Internal Medicine

## 2015-07-10 DIAGNOSIS — D649 Anemia, unspecified: Secondary | ICD-10-CM

## 2015-07-12 LAB — CBC WITH DIFFERENTIAL/PLATELET
BASOS ABS: 0 10*3/uL (ref 0.0–0.2)
Basos: 1 %
EOS (ABSOLUTE): 0.1 10*3/uL (ref 0.0–0.4)
EOS: 2 %
HEMATOCRIT: 34.6 % (ref 34.0–46.6)
HEMOGLOBIN: 11.5 g/dL (ref 11.1–15.9)
IMMATURE GRANS (ABS): 0 10*3/uL (ref 0.0–0.1)
IMMATURE GRANULOCYTES: 0 %
LYMPHS ABS: 1.9 10*3/uL (ref 0.7–3.1)
LYMPHS: 27 %
MCH: 29.2 pg (ref 26.6–33.0)
MCHC: 33.2 g/dL (ref 31.5–35.7)
MCV: 88 fL (ref 79–97)
MONOCYTES: 8 %
Monocytes Absolute: 0.6 10*3/uL (ref 0.1–0.9)
NEUTROS PCT: 62 %
Neutrophils Absolute: 4.5 10*3/uL (ref 1.4–7.0)
Platelets: 273 10*3/uL (ref 150–379)
RBC: 3.94 x10E6/uL (ref 3.77–5.28)
RDW: 13.5 % (ref 12.3–15.4)
WBC: 7.2 10*3/uL (ref 3.4–10.8)

## 2015-08-03 ENCOUNTER — Encounter: Payer: Self-pay | Admitting: Internal Medicine

## 2015-08-03 ENCOUNTER — Ambulatory Visit (INDEPENDENT_AMBULATORY_CARE_PROVIDER_SITE_OTHER): Payer: Medicare Other | Admitting: Internal Medicine

## 2015-08-03 VITALS — BP 122/84 | HR 74 | Resp 16 | Ht 61.0 in | Wt 181.0 lb

## 2015-08-03 DIAGNOSIS — Z0000 Encounter for general adult medical examination without abnormal findings: Secondary | ICD-10-CM

## 2015-08-03 DIAGNOSIS — F32A Depression, unspecified: Secondary | ICD-10-CM

## 2015-08-03 DIAGNOSIS — F329 Major depressive disorder, single episode, unspecified: Secondary | ICD-10-CM

## 2015-08-03 DIAGNOSIS — E118 Type 2 diabetes mellitus with unspecified complications: Secondary | ICD-10-CM | POA: Insufficient documentation

## 2015-08-03 DIAGNOSIS — N183 Chronic kidney disease, stage 3 unspecified: Secondary | ICD-10-CM

## 2015-08-03 DIAGNOSIS — I10 Essential (primary) hypertension: Secondary | ICD-10-CM | POA: Diagnosis not present

## 2015-08-03 DIAGNOSIS — Z23 Encounter for immunization: Secondary | ICD-10-CM | POA: Diagnosis not present

## 2015-08-03 DIAGNOSIS — E1122 Type 2 diabetes mellitus with diabetic chronic kidney disease: Secondary | ICD-10-CM | POA: Diagnosis not present

## 2015-08-03 DIAGNOSIS — E1151 Type 2 diabetes mellitus with diabetic peripheral angiopathy without gangrene: Secondary | ICD-10-CM | POA: Diagnosis not present

## 2015-08-03 DIAGNOSIS — D649 Anemia, unspecified: Secondary | ICD-10-CM

## 2015-08-03 DIAGNOSIS — Z1211 Encounter for screening for malignant neoplasm of colon: Secondary | ICD-10-CM | POA: Diagnosis not present

## 2015-08-03 LAB — POCT URINALYSIS DIPSTICK
BILIRUBIN UA: NEGATIVE
GLUCOSE UA: NEGATIVE
Ketones, UA: NEGATIVE
LEUKOCYTES UA: NEGATIVE
NITRITE UA: NEGATIVE
Protein, UA: NEGATIVE
RBC UA: NEGATIVE
Spec Grav, UA: 1.01
pH, UA: 5

## 2015-08-03 MED ORDER — SERTRALINE HCL 50 MG PO TABS: 50.0000 mg | ORAL_TABLET | Freq: Every day | ORAL | Status: DC

## 2015-08-03 NOTE — Progress Notes (Signed)
Patient: Misty Villarreal, Female    DOB: November 12, 1950, 65 y.o.   MRN: [id number] Visit Date: 08/03/2015  Today's Provider: Halina Maidens, MD   Chief Complaint  Patient presents with  . Medicare Wellness  . Hypertension   Subjective:   Initial preventative physical exam Misty Villarreal is a 65 y.o. female who presents today for her Initial Preventative Physical Exam. She feels well. She reports exercising walking. She reports she is sleeping well.  Hypertension This is a chronic problem. The current episode started more than 1 year ago. The problem is unchanged. The problem is controlled. Pertinent negatives include no chest pain, headaches, palpitations or shortness of breath.  Diabetes She presents for her follow-up diabetic visit. She has type 2 diabetes mellitus. Her disease course has been stable. Pertinent negatives for hypoglycemia include no confusion, dizziness, headaches, nervousness/anxiousness or tremors. Pertinent negatives for diabetes include no chest pain, no fatigue, no polydipsia and no polyuria. Her breakfast blood glucose is taken between 6-7 am. Her breakfast blood glucose range is generally 110-130 mg/dl.  Depression - worsening over the past months.  She has excessive sleep and sleep disruption.  She cries without provocation.  She generally has loss of interest.  She denies suicidal thoughts.  She has been on several antidepressants in the past - she did well on Sertraline. Anemia - recent mild anemia with normal iron studies and negative FIT testing.  Colonoscopy done in 2002.  Patient is not eager to repeat.  If still low, will refer to Hematology.  Lab Results  Component Value Date   HGBA1C 6.9* 06/21/2015   Lab Results  Component Value Date   CREATININE 0.96 06/21/2015   Lab Results  Component Value Date   CHOL 171 01/10/2014   HDL 51 01/10/2014   LDLCALC 67 01/10/2014   TRIG 264* 01/10/2014   Last Hemoglobin 10.8.    Review of Systems    Constitutional: Negative for fever, chills and fatigue.  HENT: Negative for congestion, hearing loss, tinnitus, trouble swallowing and voice change.   Eyes: Negative for visual disturbance.  Respiratory: Negative for cough, chest tightness, shortness of breath and wheezing.   Cardiovascular: Negative for chest pain, palpitations and leg swelling.  Gastrointestinal: Negative for vomiting, abdominal pain, diarrhea and constipation.  Endocrine: Negative for polydipsia and polyuria.  Genitourinary: Negative for dysuria, frequency, vaginal bleeding, vaginal discharge and genital sores.  Musculoskeletal: Negative for joint swelling, arthralgias and gait problem.  Skin: Negative for color change and rash.  Neurological: Negative for dizziness, tremors, light-headedness and headaches.  Hematological: Negative for adenopathy. Does not bruise/bleed easily.  Psychiatric/Behavioral: Positive for dysphoric mood. Negative for suicidal ideas, confusion, sleep disturbance and decreased concentration. The patient is not nervous/anxious.     Social History   Social History  . Marital Status: Married    Spouse Name: N/A  . Number of Children: N/A  . Years of Education: N/A   Occupational History  . Not on file.   Social History Main Topics  . Smoking status: Former Research scientist (life sciences)  . Smokeless tobacco: Former Systems developer    Quit date: 12/21/2013  . Alcohol Use: 1.2 oz/week    2 Standard drinks or equivalent per week     Comment: occasional  . Drug Use: No  . Sexual Activity: Not on file   Other Topics Concern  . Not on file   Social History Narrative    Patient Active Problem List   Diagnosis Date Noted  . Type 2 diabetes  mellitus with stage 3 chronic kidney disease, without long-term current use of insulin ([place]) 08/03/2015  . Anemia 06/24/2015  . Localized edema 06/21/2015  . Tachycardia, paroxysmal ([place]) 06/21/2015  . Peripheral arterial occlusive disease ([place]) 02/02/2015  . Chronic kidney disease  (CKD), stage III (moderate) 09/13/2014  . Angiopathy, peripheral ([place]) 09/13/2014  . Familial multiple lipoprotein-type hyperlipidemia 09/01/2014  . Clinical depression 09/01/2014  . Neuropathy ([place]) 09/01/2014  . Phlebectasia 09/01/2014  . Abnormal glandular Papanicolaou smear of cervix 09/01/2014  . DM (diabetes mellitus), type 2 with peripheral vascular complications ([place]) [id number]  . Essential (primary) hypertension 09/01/2014  . Arthritis, degenerative 01/31/2014    Past Surgical History  Procedure Laterality Date  . Brain meningioma excision  1988  . Knee arthroplasty Right 2009  . Carpal tunnel release Left 1980  . Cholecystectomy  1987  . Angioplasty / stenting femoral Left 2014, 2013  . Breast biopsy Right 1989    benign    Her family history includes Diabetes in her father and mother; Heart failure in her father.    Previous Medications   ASPIRIN 81 MG TABLET    Take 1 tablet by mouth daily.   CLOPIDOGREL (PLAVIX) 75 MG TABLET    Take 1 tablet by mouth daily.   FEXOFENADINE (ALLEGRA) 180 MG TABLET    Take 180 mg by mouth daily.   FUROSEMIDE (LASIX) 20 MG TABLET    Take 0.5-1 tablets (10-20 mg total) by mouth daily as needed.   LISINOPRIL-HYDROCHLOROTHIAZIDE (PRINZIDE,ZESTORETIC) 20-12.5 MG TABLET    Take 1 tablet by mouth daily.   METFORMIN (GLUCOPHAGE) 500 MG TABLET    TAKE 1 TABLET TWICE A DAY   MULTIPLE VITAMIN PO    Take by mouth.   SIMVASTATIN (ZOCOR) 80 MG TABLET    TAKE 1 TABLET AT BEDTIME    Patient Care Team: Glean Hess, MD as PCP - General (Internal Medicine) Katha Cabal, MD (Vascular Surgery) Amy Awilda Metro, MD as Referring Physician (Nephrology)     Objective:   Vitals: BP 122/84 mmHg  Pulse 74  Resp 16  Ht 5\' 1"  (1.549 m)  Wt 181 lb (82.101 kg)  BMI 34.22 kg/m2  SpO2 98%  LMP  (LMP Unknown)  Physical Exam  Constitutional: She is oriented to person, place, and time. She appears well-developed and well-nourished. No distress.    HENT:  Head: Normocephalic and atraumatic.  Right Ear: Tympanic membrane and ear canal normal.  Left Ear: Tympanic membrane and ear canal normal.  Nose: Right sinus exhibits no maxillary sinus tenderness. Left sinus exhibits no maxillary sinus tenderness.  Mouth/Throat: Uvula is midline and oropharynx is clear and moist.  Eyes: Conjunctivae and EOM are normal. Right eye exhibits no discharge. Left eye exhibits no discharge. No scleral icterus.  Neck: Normal range of motion. Neck supple. Carotid bruit is not present. No erythema present. No thyromegaly present.  Cardiovascular: Normal rate, regular rhythm and normal heart sounds.   Pulses:      Dorsalis pedis pulses are 1+ on the right side, and 0 on the left side.       Posterior tibial pulses are 1+ on the right side, and 0 on the left side.  Pulmonary/Chest: Effort normal and breath sounds normal. No respiratory distress. She has no wheezes. Right breast exhibits no mass, no nipple discharge, no skin change and no tenderness. Left breast exhibits no mass, no nipple discharge, no skin change and no tenderness.  Abdominal: Soft. Bowel sounds are normal.  There is no hepatosplenomegaly. There is no tenderness. There is no CVA tenderness.  Musculoskeletal: She exhibits no edema or tenderness.  Lymphadenopathy:    She has no cervical adenopathy.    She has no axillary adenopathy.  Neurological: She is alert and oriented to person, place, and time. She has normal reflexes. No cranial nerve deficit or sensory deficit.  Foot exam - normal skin, nails, and sensation bilaterally.  Decreased pulses L>R   Skin: Skin is warm, dry and intact. No rash noted.  Psychiatric: She has a normal mood and affect. Her speech is normal and behavior is normal. Thought content normal.  Nursing note and vitals reviewed.    Hearing Screening Comments: Pass R and L Vision Screening Comments: Eye dr  Activities of Daily Living In your present state of health, do  you have any difficulty performing the following activities: 06/21/2015 09/29/2014  Hearing? N N  Vision? N N  Difficulty concentrating or making decisions? N N  Walking or climbing stairs? N N  Dressing or bathing? N N  Doing errands, shopping? N N    Fall Risk Assessment Fall Risk  06/21/2015 02/02/2015  Falls in the past year? No No      Depression Screen PHQ 2/9 Scores 08/03/2015 06/21/2015 02/02/2015  PHQ - 2 Score 6 0 2  PHQ- 9 Score 15 - 3    Cognitive Testing - 6-CIT   Correct? Score   What year is it? yes 0 Yes = 0    No = 4  What month is it? yes 0 Yes = 0    No = 3  Remember:     Pia [address], [place], [place]     What time is it? yes 0 Yes = 0    No = 3  Count backwards from 20 to 1 yes 0 Correct = 0    1 error = 2   More than 1 error = 4  Say the months of the year in reverse. yes 0 Correct = 0    1 error = 2   More than 1 error = 4  What address did I ask you to remember? yes 0 Correct = 0  1 error = 2    2 error = 4    3 error = 6    4 error = 8    All wrong = 10       TOTAL SCORE  0/28   Interpretation:  Normal  Normal (0-7) Abnormal (8-28)     Medicare Initial Preventative Physical Exam  Reviewed patient's Family Medical History Reviewed and updated list of patient's medical providers Assessment of cognitive impairment was done Assessed patient's functional ability Established a written schedule for health screening [place] Completed and Reviewed  Exercise Activities and Dietary recommendations Goals    None      Immunization History  Administered Date(s) Administered  . Influenza-Unspecified 12/19/2014  . Pneumococcal Polysaccharide-23 12/15/2012    Health Maintenance  Topic Date Due  . HIV Screening  10/04/1964  . TETANUS/TDAP  10/04/1968  . ZOSTAVAX  10/04/2009  . COLONOSCOPY  01/22/2010  . DEXA SCAN  10/05/2014  . PNA vac Low Risk Adult (1 of 2 - PCV13) 10/05/2014  . PAP SMEAR  01/22/2016 (Originally  01/10/2014)  . OPHTHALMOLOGY EXAM  08/04/2015  . INFLUENZA VACCINE  08/22/2015  . HEMOGLOBIN A1C  02/03/2016  . MAMMOGRAM  02/17/2016  . FOOT EXAM  08/02/2016  .  Hepatitis C Screening  Addressed      Discussed health benefits of physical activity, and encouraged her to engage in regular exercise appropriate for her age and condition.    ------------------------------------------------------------------------------------------------------------   Assessment & Plan:  1. Medicare annual wellness visit, initial Measures satisfied  2. Essential (primary) hypertension controlled - CBC with Differential/Platelet - TSH - POCT urinalysis dipstick  3. DM (diabetes mellitus), type 2 with peripheral vascular complications ([place]) Continue current therapy - will advise if changes needed - Comprehensive metabolic panel - Lipid panel - Hemoglobin A1c  4. Type 2 diabetes mellitus with stage 3 chronic kidney disease, without long-term current use of insulin (HCC) Last GFR was normal for unclear reasons - Microalbumin / creatinine urine ratio  5. Depression Recurrence of symptoms recently - need to begin treatment If doing well, will follow up in 4 months, otherwise return sooner for dose adjustment - sertraline (ZOLOFT) 50 MG tablet; Take 1 tablet (50 mg total) by mouth daily.  Dispense: 90 tablet; Refill: 3  6. Need for pneumococcal vaccination - Pneumococcal conjugate vaccine 13-valent IM  7. Colon cancer screening Recent mild anemia with normal iron studies and negative fecal occult blood CBC repeated and will refer to Hematology if persistent   Halina Maidens, MD [facility]  08/03/2015

## 2015-08-03 NOTE — Patient Instructions (Addendum)
Health Maintenance  Topic Date Due  . HIV Screening  10/04/1964  . TETANUS/TDAP  10/04/1968  . ZOSTAVAX  10/04/2009  . COLONOSCOPY  01/22/2010  . DEXA SCAN  10/05/2014  . PNA vac Low Risk Adult (1 of 2 - PCV13) 10/05/2014  . PAP SMEAR  01/22/2016 (Originally 01/10/2014)  . OPHTHALMOLOGY EXAM  08/04/2015  . INFLUENZA VACCINE  08/22/2015  . HEMOGLOBIN A1C  02/03/2016  . MAMMOGRAM  02/17/2016  . FOOT EXAM  08/02/2016  . Hepatitis C Screening  Addressed   Pneumococcal Conjugate Vaccine (PCV13)  1. Why get vaccinated? Vaccination can protect both children and adults from pneumococcal disease. Pneumococcal disease is caused by bacteria that can spread from person to person through close contact. It can cause ear infections, and it can also lead to more serious infections of the:  Lungs (pneumonia),  Blood (bacteremia), and  Covering of the brain and spinal cord (meningitis). Pneumococcal pneumonia is most common among adults. Pneumococcal meningitis can cause deafness and brain damage, and it kills about 1 child in 10 who get it. Anyone can get pneumococcal disease, but children under 55 years of age and adults 81 years and older, people with certain medical conditions, and cigarette smokers are at the highest risk. Before there was a vaccine, the [place] saw:  more than 700 cases of meningitis,  about 13,000 blood infections,  about 5 million ear infections, and  about 200 deaths in children under 5 each year from pneumococcal disease. Since vaccine became available, severe pneumococcal disease in these children has fallen by 88%. About 18,000 older adults die of pneumococcal disease each year in the [place]. Treatment of pneumococcal infections with penicillin and other drugs is not as effective as it used to be, because some strains of the disease have become resistant to these drugs. This makes prevention of the disease, through vaccination, even more important. 2.  PCV13 vaccine Pneumococcal conjugate vaccine (called PCV13) protects against 13 types of pneumococcal bacteria. PCV13 is routinely given to children at 2, 4, 6, and 65-2 months of age. It is also recommended for children and adults 38 to 42 years of age with certain health conditions, and for all adults 22 years of age and older. Your doctor can give you details. 3. Some people should not get this vaccine Anyone who has ever had a life-threatening allergic reaction to a dose of this vaccine, to an earlier pneumococcal vaccine called PCV7, or to any vaccine containing diphtheria toxoid (for example, DTaP), should not get PCV13. Anyone with a severe allergy to any component of PCV13 should not get the vaccine. Tell your doctor if the person being vaccinated has any severe allergies. If the person scheduled for vaccination is not feeling well, your healthcare provider might decide to reschedule the shot on another day. 4. Risks of a vaccine reaction With any medicine, including vaccines, there is a chance of reactions. These are usually mild and go away on their own, but serious reactions are also possible. Problems reported following PCV13 varied by age and dose in the series. The most common problems reported among children were:  About half became drowsy after the shot, had a temporary loss of appetite, or had redness or tenderness where the shot was given.  About 1 out of 3 had swelling where the shot was given.  About 1 out of 3 had a mild fever, and about 1 in 20 had a fever over 102.21F.  Up to about 8 out  of 10 became fussy or irritable. Adults have reported pain, redness, and swelling where the shot was given; also mild fever, fatigue, headache, chills, or muscle pain. Young children who get PCV13 along with inactivated flu vaccine at the same time may be at increased risk for seizures caused by fever. Ask your doctor for more information. Problems that could happen after any  vaccine:  People sometimes faint after a medical procedure, including vaccination. Sitting or lying down for about 15 minutes can help prevent fainting, and injuries caused by a fall. Tell your doctor if you feel dizzy, or have vision changes or ringing in the ears.  Some older children and adults get severe pain in the shoulder and have difficulty moving the arm where a shot was given. This happens very rarely.  Any medication can cause a severe allergic reaction. Such reactions from a vaccine are very rare, estimated at about 1 in a million doses, and would happen within a few minutes to a few hours after the vaccination. As with any medicine, there is a very small chance of a vaccine causing a serious injury or death. The safety of vaccines is always being monitored. For more information, visit: http://www.aguilar.org/ 5. What if there is a serious reaction? What should I look for?  Look for anything that concerns you, such as signs of a severe allergic reaction, very high fever, or unusual behavior. Signs of a severe allergic reaction can include hives, swelling of the face and throat, difficulty breathing, a fast heartbeat, dizziness, and weakness-usually within a few minutes to a few hours after the vaccination. What should I do?  If you think it is a severe allergic reaction or other emergency that can't wait, call 9-1-1 or get the person to the nearest hospital. Otherwise, call your doctor. Reactions should be reported to the Vaccine Adverse Event Reporting System (VAERS). Your doctor should file this report, or you can do it yourself through the VAERS web site at www.vaers.SamedayNews.es, or by calling 904-782-8423. VAERS does not give medical advice. 6. The National Vaccine Injury Compensation Program The [employer] Vaccine Injury Compensation Program (VICP) is a federal program that was created to compensate people who may have been injured by certain vaccines. Persons who believe they  may have been injured by a vaccine can learn about the program and about filing a claim by calling 517-816-1549 or visiting the [place] website at GoldCloset.com.ee. There is a time limit to file a claim for compensation. 7. How can I learn more?  Ask your healthcare provider. He or she can give you the vaccine package insert or suggest other sources of information.  Call your local or state health department.  Contact the Centers for Disease Control and Prevention (CDC):  Call 435-640-0486 (1-800-CDC-INFO) or  Visit CDC's website at http://hunter.com/ Vaccine Information Statement PCV13 Vaccine (11/25/2013)   This information is not intended to replace advice given to you by your health care provider. Make sure you discuss any questions you have with your health care provider.   Document Released: 11/04/2005 Document Revised: 01/28/2014 Document Reviewed: 12/02/2013 Elsevier Interactive Patient Education [employer].

## 2015-08-04 DIAGNOSIS — Z23 Encounter for immunization: Secondary | ICD-10-CM | POA: Diagnosis not present

## 2015-08-04 LAB — LIPID PANEL
CHOL/HDL RATIO: 2.7 ratio (ref 0.0–4.4)
Cholesterol, Total: 146 mg/dL (ref 100–199)
HDL: 54 mg/dL (ref >39)
LDL CALC: 44 mg/dL (ref 0–99)
TRIGLYCERIDES: 240 mg/dL — AB (ref 0–149)
VLDL Cholesterol Cal: 48 mg/dL — ABNORMAL HIGH (ref 5–40)

## 2015-08-04 LAB — CBC WITH DIFFERENTIAL/PLATELET
BASOS: 1 %
Basophils Absolute: 0.1 10*3/uL (ref 0.0–0.2)
EOS (ABSOLUTE): 0.2 10*3/uL (ref 0.0–0.4)
Eos: 2 %
HEMATOCRIT: 35.6 % (ref 34.0–46.6)
Hemoglobin: 11.7 g/dL (ref 11.1–15.9)
Immature Grans (Abs): 0 10*3/uL (ref 0.0–0.1)
Immature Granulocytes: 0 %
LYMPHS ABS: 2.4 10*3/uL (ref 0.7–3.1)
Lymphs: 23 %
MCH: 28.8 pg (ref 26.6–33.0)
MCHC: 32.9 g/dL (ref 31.5–35.7)
MCV: 88 fL (ref 79–97)
MONOS ABS: 0.8 10*3/uL (ref 0.1–0.9)
Monocytes: 7 %
Neutrophils Absolute: 7.1 10*3/uL — ABNORMAL HIGH (ref 1.4–7.0)
Neutrophils: 67 %
Platelets: 315 10*3/uL (ref 150–379)
RBC: 4.06 x10E6/uL (ref 3.77–5.28)
RDW: 13.7 % (ref 12.3–15.4)
WBC: 10.5 10*3/uL (ref 3.4–10.8)

## 2015-08-04 LAB — COMPREHENSIVE METABOLIC PANEL
A/G RATIO: 1.8 (ref 1.2–2.2)
ALK PHOS: 66 IU/L (ref 39–117)
ALT: 16 IU/L (ref 0–32)
AST: 19 IU/L (ref 0–40)
Albumin: 4.5 g/dL (ref 3.6–4.8)
BUN/Creatinine Ratio: 19 (ref 12–28)
BUN: 26 mg/dL (ref 8–27)
Bilirubin Total: 0.9 mg/dL (ref 0.0–1.2)
CO2: 19 mmol/L (ref 18–29)
CREATININE: 1.36 mg/dL — AB (ref 0.57–1.00)
Calcium: 9.7 mg/dL (ref 8.7–10.3)
Chloride: 99 mmol/L (ref 96–106)
GFR, EST AFRICAN AMERICAN: 47 mL/min/{1.73_m2} — AB (ref >59)
GFR, EST NON AFRICAN AMERICAN: 41 mL/min/{1.73_m2} — AB (ref >59)
GLOBULIN, TOTAL: 2.5 g/dL (ref 1.5–4.5)
Glucose: 83 mg/dL (ref 65–99)
POTASSIUM: 4.7 mmol/L (ref 3.5–5.2)
SODIUM: 139 mmol/L (ref 134–144)
Total Protein: 7 g/dL (ref 6.0–8.5)

## 2015-08-04 LAB — HEMOGLOBIN A1C
ESTIMATED AVERAGE GLUCOSE: 143 mg/dL
Hgb A1c MFr Bld: 6.6 % — ABNORMAL HIGH (ref 4.8–5.6)

## 2015-08-04 LAB — TSH: TSH: 2.74 u[IU]/mL (ref 0.450–4.500)

## 2015-10-30 ENCOUNTER — Other Ambulatory Visit: Payer: Self-pay | Admitting: Internal Medicine

## 2015-12-10 ENCOUNTER — Other Ambulatory Visit: Payer: Self-pay | Admitting: Internal Medicine

## 2015-12-25 ENCOUNTER — Encounter (INDEPENDENT_AMBULATORY_CARE_PROVIDER_SITE_OTHER): Payer: Self-pay

## 2015-12-25 ENCOUNTER — Ambulatory Visit (INDEPENDENT_AMBULATORY_CARE_PROVIDER_SITE_OTHER): Payer: Self-pay | Admitting: Vascular Surgery

## 2016-01-26 ENCOUNTER — Other Ambulatory Visit (INDEPENDENT_AMBULATORY_CARE_PROVIDER_SITE_OTHER): Payer: Self-pay | Admitting: Vascular Surgery

## 2016-01-26 DIAGNOSIS — I739 Peripheral vascular disease, unspecified: Secondary | ICD-10-CM

## 2016-01-29 ENCOUNTER — Ambulatory Visit (INDEPENDENT_AMBULATORY_CARE_PROVIDER_SITE_OTHER): Payer: Medicare HMO

## 2016-01-29 ENCOUNTER — Ambulatory Visit (INDEPENDENT_AMBULATORY_CARE_PROVIDER_SITE_OTHER): Payer: Medicare HMO | Admitting: Vascular Surgery

## 2016-01-29 ENCOUNTER — Encounter (INDEPENDENT_AMBULATORY_CARE_PROVIDER_SITE_OTHER): Payer: Self-pay | Admitting: Vascular Surgery

## 2016-01-29 VITALS — BP 138/84 | HR 77 | Resp 16 | Ht 65.5 in | Wt 178.0 lb

## 2016-01-29 DIAGNOSIS — N183 Chronic kidney disease, stage 3 unspecified: Secondary | ICD-10-CM

## 2016-01-29 DIAGNOSIS — E1122 Type 2 diabetes mellitus with diabetic chronic kidney disease: Secondary | ICD-10-CM | POA: Diagnosis not present

## 2016-01-29 DIAGNOSIS — I739 Peripheral vascular disease, unspecified: Secondary | ICD-10-CM | POA: Diagnosis not present

## 2016-01-29 DIAGNOSIS — E785 Hyperlipidemia, unspecified: Secondary | ICD-10-CM

## 2016-01-30 ENCOUNTER — Other Ambulatory Visit (INDEPENDENT_AMBULATORY_CARE_PROVIDER_SITE_OTHER): Payer: Self-pay | Admitting: Vascular Surgery

## 2016-01-30 ENCOUNTER — Other Ambulatory Visit: Payer: Self-pay | Admitting: Internal Medicine

## 2016-01-30 DIAGNOSIS — I10 Essential (primary) hypertension: Secondary | ICD-10-CM

## 2016-01-30 NOTE — Progress Notes (Signed)
Subjective:    Patient ID: Misty Villarreal, female    DOB: 1950/04/22, 66 y.o.   MRN: [id number] Chief Complaint  Patient presents with  . Re-evaluation    Ultasound follow up   Patient presents for a yearly PAD follow up. She denies any changes in the status of her lower extremity. Patient continues to experiences intermittent claudication, however is not lifestyle limiting at this time. The patient underwent an ABI which showed Right ABI: 0.91 and Left 0.71 (on 12/22/14, Right ABI: 0.93 and Left 0.67). The patient denies any rest pain or ulcers to her feet / toes.    Review of Systems  Constitutional: Negative.   HENT: Negative.   Eyes: Negative.   Respiratory: Negative.   Cardiovascular:       Stable intermittent bilateral claudication  Gastrointestinal: Negative.   Endocrine: Negative.   Genitourinary: Negative.   Musculoskeletal: Negative.   Skin: Negative.   Allergic/Immunologic: Negative.   Neurological: Negative.   Hematological: Negative.   Psychiatric/Behavioral: Negative.       Objective:   Physical Exam  Constitutional: She is oriented to person, place, and time. She appears well-developed and well-nourished.  HENT:  Head: Normocephalic and atraumatic.  Right Ear: External ear normal.  Left Ear: External ear normal.  Eyes: Conjunctivae and EOM are normal. Pupils are equal, round, and reactive to light.  Neck: Normal range of motion.  Cardiovascular: Normal rate, regular rhythm, normal heart sounds and intact distal pulses.   Pulses:      Radial pulses are 2+ on the right side, and 2+ on the left side.       Dorsalis pedis pulses are 2+ on the right side, and 1+ on the left side.       Posterior tibial pulses are 2+ on the right side, and 1+ on the left side.  Pulmonary/Chest: Effort normal and breath sounds normal.  Abdominal: Soft. Bowel sounds are normal.  Musculoskeletal: Normal range of motion. She exhibits no edema.  Neurological: She is alert and  oriented to person, place, and time.  Skin: Skin is warm and dry.  Psychiatric: She has a normal mood and affect. Her behavior is normal. Judgment and thought content normal.   BP 138/84 (BP Location: Right Arm)   Pulse 77   Resp 16   Ht 5' 5.5" (1.664 m)   Wt 178 lb (80.7 kg)   LMP  (LMP Unknown)   BMI 29.17 kg/m   No past medical history on file.  Social History   Social History  . Marital status: Married    Spouse name: N/A  . Number of children: N/A  . Years of education: N/A   Occupational History  . Not on file.   Social History Main Topics  . Smoking status: Former Research scientist (life sciences)  . Smokeless tobacco: Former Systems developer    Quit date: 12/21/2013  . Alcohol use 1.2 oz/week    2 Standard drinks or equivalent per week     Comment: occasional  . Drug use: No  . Sexual activity: Not on file   Other Topics Concern  . Not on file   Social History Narrative  . No narrative on file    Past Surgical History:  Procedure Laterality Date  . ANGIOPLASTY / STENTING FEMORAL Left 2014, 2013  . BRAIN MENINGIOMA EXCISION  1988  . BREAST BIOPSY Right 1989   benign  . CARPAL TUNNEL RELEASE Left 1980  . CHOLECYSTECTOMY  1987  . KNEE ARTHROPLASTY Right  2009    Family History  Problem Relation Age of Onset  . Diabetes Mother   . Heart failure Father   . Diabetes Father     Allergies  Allergen Reactions  . Amoxicillin-Pot Clavulanate Diarrhea  . Tetracycline     Other reaction(s): emesis  . Cefaclor Rash  . Cephalexin Rash  . Sulfa Antibiotics Rash    Other reaction(s): emesis       Assessment & Plan:  Patient presents for a yearly PAD follow up. She denies any changes in the status of her lower extremity. Patient continues to experiences intermittent claudication, however is not lifestyle limiting at this time. The patient underwent an ABI which showed Right ABI: 0.91 and Left 0.71 (on 12/22/14, Right ABI: 0.93 and Left 0.67). The patient denies any rest pain or ulcers to her  feet / toes.   1. PAD (peripheral artery disease) (HCC) - Stable Intermittent claudication not lifestyle limiting.  Stable ABI.  No indication for intervention at this time. Patient to continue medical optimization with ASA and dyslipidemia medication. Patient to remain abstinent of tobacco use. I have discussed with the patient at length the risk factors for and pathogenesis of atherosclerotic disease and encouraged a healthy diet, regular exercise regimen and blood pressure / glucose control.  The patient was encouraged to call the office in the interim if she experiences any claudication like symptoms, rest pain or ulcers to her feet / toes.  - VAS [place] ABI WITH/WO TBI; Future  2. Type 2 diabetes mellitus with stage 3 chronic kidney disease, without long-term current use of insulin (HCC) - Stable Encouraged good control as its slows the progression of atherosclerotic disease  3. Hyperlipidemia, unspecified hyperlipidemia type - Stable Encouraged good control as its slows the progression of atherosclerotic disease  Current Outpatient Prescriptions on File Prior to Visit  Medication Sig Dispense Refill  . aspirin 81 MG tablet Take 1 tablet by mouth daily.    . fexofenadine (ALLEGRA) 180 MG tablet Take 180 mg by mouth daily.    . furosemide (LASIX) 20 MG tablet Take 0.5-1 tablets (10-20 mg total) by mouth daily as needed. 30 tablet 3  . metFORMIN (GLUCOPHAGE) 500 MG tablet TAKE 1 TABLET TWICE A DAY 180 tablet 1  . MULTIPLE VITAMIN PO Take by mouth.    . sertraline (ZOLOFT) 50 MG tablet Take 1 tablet (50 mg total) by mouth daily. 90 tablet 3  . simvastatin (ZOCOR) 80 MG tablet TAKE 1 TABLET AT BEDTIME 90 tablet 3   No current facility-administered medications on file prior to visit.     There are no Patient Instructions on file for this visit. No Follow-up on file.   Shavana Calder A Raynee Mccasland, PA-C

## 2016-02-05 ENCOUNTER — Encounter: Payer: Self-pay | Admitting: Internal Medicine

## 2016-02-05 ENCOUNTER — Ambulatory Visit (INDEPENDENT_AMBULATORY_CARE_PROVIDER_SITE_OTHER): Payer: Medicare HMO | Admitting: Internal Medicine

## 2016-02-05 VITALS — BP 118/82 | HR 82 | Temp 97.7°F | Ht 62.0 in | Wt 175.0 lb

## 2016-02-05 DIAGNOSIS — E1122 Type 2 diabetes mellitus with diabetic chronic kidney disease: Secondary | ICD-10-CM | POA: Diagnosis not present

## 2016-02-05 DIAGNOSIS — E1151 Type 2 diabetes mellitus with diabetic peripheral angiopathy without gangrene: Secondary | ICD-10-CM | POA: Diagnosis not present

## 2016-02-05 DIAGNOSIS — J0100 Acute maxillary sinusitis, unspecified: Secondary | ICD-10-CM

## 2016-02-05 DIAGNOSIS — N183 Chronic kidney disease, stage 3 unspecified: Secondary | ICD-10-CM

## 2016-02-05 DIAGNOSIS — I10 Essential (primary) hypertension: Secondary | ICD-10-CM | POA: Diagnosis not present

## 2016-02-05 MED ORDER — AZITHROMYCIN 250 MG PO TABS: ORAL_TABLET | ORAL | 0 refills | Status: DC

## 2016-02-05 NOTE — Progress Notes (Signed)
Date:  02/05/2016   Name:  Misty Villarreal   DOB:  1950/03/31   MRN:  [id number]   Chief Complaint: Diabetes (Pt stated sugar is 190 this morning...) and Cough Diabetes  She presents for her follow-up diabetic visit. She has type 2 diabetes mellitus. Her disease course has been fluctuating. Pertinent negatives for hypoglycemia include no dizziness or headaches. Associated symptoms include fatigue. Pertinent negatives for diabetes include no chest pain, no foot paresthesias, no polyuria, no visual change and no weakness. Diabetic complications include nephropathy and PVD. Current diabetic treatment includes oral agent (monotherapy). She is compliant with treatment all of the time. She monitors blood glucose at home 1-2 x per day. Her home blood glucose trend is fluctuating minimally. Her breakfast blood glucose is taken between 6-7 am. Her breakfast blood glucose range is generally 130-140 mg/dl.  Cough  This is a new problem. The current episode started 1 to 4 weeks ago. The problem has been unchanged. The problem occurs every few minutes. The cough is productive of sputum. Associated symptoms include a sore throat. Pertinent negatives include no chest pain, chills, ear pain, fever, headaches, shortness of breath or wheezing. The symptoms are aggravated by exercise. She has tried OTC cough suppressant (and allegra) for the symptoms. The treatment provided moderate relief. Her past medical history is significant for environmental allergies.  Hypertension  This is a chronic problem. The current episode started today. The problem is unchanged. The problem is controlled. Pertinent negatives include no chest pain, headaches, palpitations or shortness of breath. Hypertensive end-organ damage includes PVD.    Lab Results  Component Value Date   HGBA1C 6.6 (H) 08/03/2015     Review of Systems  Constitutional: Positive for fatigue. Negative for chills and fever.  HENT: Positive for congestion,  sinus pressure and sore throat. Negative for ear discharge and ear pain.   Eyes: Negative for visual disturbance.  Respiratory: Positive for cough. Negative for chest tightness, shortness of breath and wheezing.   Cardiovascular: Negative for chest pain, palpitations and leg swelling.  Gastrointestinal: Negative for abdominal pain.  Endocrine: Negative for polyuria.  Genitourinary: Negative for dysuria.  Allergic/Immunologic: Positive for environmental allergies.  Neurological: Negative for dizziness, weakness and headaches.    Patient Active Problem List   Diagnosis Date Noted  . Type 2 diabetes mellitus with stage 3 chronic kidney disease, without long-term current use of insulin ([place]) 08/03/2015  . Anemia 06/24/2015  . Localized edema 06/21/2015  . Tachycardia, paroxysmal ([place]) 06/21/2015  . PAD (peripheral artery disease) ([place]) 02/02/2015  . Chronic kidney disease (CKD), stage III (moderate) 09/13/2014  . Angiopathy, peripheral ([place]) 09/13/2014  . Hyperlipidemia 09/01/2014  . Clinical depression 09/01/2014  . Neuropathy ([place]) 09/01/2014  . Phlebectasia 09/01/2014  . Abnormal glandular Papanicolaou smear of cervix 09/01/2014  . DM (diabetes mellitus), type 2 with peripheral vascular complications ([place]) [id number]  . Essential (primary) hypertension 09/01/2014  . Arthritis, degenerative 01/31/2014    Prior to Admission medications   Medication Sig Start Date End Date Taking? Authorizing Provider  aspirin 81 MG tablet Take 1 tablet by mouth daily.   Yes Historical Provider, MD  clopidogrel (PLAVIX) 75 MG tablet TAKE 1 TABLET DAILY 01/30/16  Yes Kimberly A Stegmayer, PA-C  fexofenadine (ALLEGRA) 180 MG tablet Take 180 mg by mouth daily.   Yes Historical Provider, MD  lisinopril-hydrochlorothiazide (PRINZIDE,ZESTORETIC) 20-12.5 MG tablet TAKE 1 TABLET DAILY 01/30/16  Yes Glean Hess, MD  metFORMIN (GLUCOPHAGE) 500 MG tablet TAKE  1 TABLET TWICE A DAY 12/10/15  Yes Glean Hess,  MD  MULTIPLE VITAMIN PO Take by mouth.   Yes Historical Provider, MD  sertraline (ZOLOFT) 50 MG tablet Take 1 tablet (50 mg total) by mouth daily. 08/03/15  Yes Glean Hess, MD  simvastatin (ZOCOR) 80 MG tablet TAKE 1 TABLET AT BEDTIME 10/30/15  Yes Glean Hess, MD  furosemide (LASIX) 20 MG tablet Take 0.5-1 tablets (10-20 mg total) by mouth daily as needed. Patient not taking: Reported on 02/05/2016 06/21/15   Glean Hess, MD    Allergies  Allergen Reactions  . Amoxicillin-Pot Clavulanate Diarrhea  . Tetracycline     Other reaction(s): emesis  . Cefaclor Rash  . Cephalexin Rash  . Sulfa Antibiotics Rash    Other reaction(s): emesis    Past Surgical History:  Procedure Laterality Date  . ANGIOPLASTY / STENTING FEMORAL Left 2014, 2013  . BRAIN MENINGIOMA EXCISION  1988  . BREAST BIOPSY Right 1989   benign  . CARPAL TUNNEL RELEASE Left 1980  . CHOLECYSTECTOMY  1987  . KNEE ARTHROPLASTY Right 2009    Social History  Substance Use Topics  . Smoking status: Former Research scientist (life sciences)  . Smokeless tobacco: Former Systems developer    Quit date: 12/21/2013  . Alcohol use 1.2 oz/week    2 Standard drinks or equivalent per week     Comment: occasional     Medication list has been reviewed and updated.   Physical Exam  Constitutional: She is oriented to person, place, and time. She appears well-developed. No distress.  HENT:  Head: Normocephalic and atraumatic.  Right Ear: Tympanic membrane and ear canal normal.  Left Ear: Tympanic membrane and ear canal normal.  Nose: Right sinus exhibits maxillary sinus tenderness. Left sinus exhibits maxillary sinus tenderness.  Mouth/Throat: No posterior oropharyngeal edema or posterior oropharyngeal erythema.  Cardiovascular: Normal rate, regular rhythm and normal heart sounds.   Pulmonary/Chest: Effort normal and breath sounds normal. No respiratory distress.  Musculoskeletal: Normal range of motion.  Neurological: She is alert and oriented to  person, place, and time.  Skin: Skin is warm and dry. No rash noted.  Psychiatric: She has a normal mood and affect. Her behavior is normal. Thought content normal.  Nursing note and vitals reviewed.   BP 118/82   Pulse 82   Temp 97.7 F (36.5 C)   Ht 5\' 2"  (1.575 m)   Wt 175 lb (79.4 kg)   LMP  (LMP Unknown)   SpO2 98%   BMI 32.01 kg/m   Assessment and Plan: 1. Essential (primary) hypertension controlled  2. DM (diabetes mellitus), type 2 with peripheral vascular complications (HCC) Stable; glucoses up due to cough, otc cough syrup - Hemoglobin A1c  3. Type 2 diabetes mellitus with stage 3 chronic kidney disease, without long-term current use of insulin ([place]) Will monitor renal function at each visit Pt will only see Nephrology if worsening - Basic metabolic panel  4. Acute non-recurrent maxillary sinusitis - azithromycin (ZITHROMAX Z-PAK) 250 MG tablet; UAD  Dispense: 6 each; Refill: 0   Halina Maidens, MD [facility]  02/05/2016

## 2016-02-06 LAB — BASIC METABOLIC PANEL
BUN/Creatinine Ratio: 21 (ref 12–28)
BUN: 26 mg/dL (ref 8–27)
CALCIUM: 9.7 mg/dL (ref 8.7–10.3)
CO2: 20 mmol/L (ref 18–29)
Chloride: 98 mmol/L (ref 96–106)
Creatinine, Ser: 1.26 mg/dL — ABNORMAL HIGH (ref 0.57–1.00)
GFR, EST AFRICAN AMERICAN: 51 mL/min/{1.73_m2} — AB (ref >59)
GFR, EST NON AFRICAN AMERICAN: 45 mL/min/{1.73_m2} — AB (ref >59)
Glucose: 130 mg/dL — ABNORMAL HIGH (ref 65–99)
Potassium: 5.3 mmol/L — ABNORMAL HIGH (ref 3.5–5.2)
Sodium: 140 mmol/L (ref 134–144)

## 2016-02-06 LAB — HEMOGLOBIN A1C
Est. average glucose Bld gHb Est-mCnc: 146 mg/dL
Hgb A1c MFr Bld: 6.7 % — ABNORMAL HIGH (ref 4.8–5.6)

## 2016-05-11 DIAGNOSIS — E119 Type 2 diabetes mellitus without complications: Secondary | ICD-10-CM | POA: Diagnosis not present

## 2016-05-15 ENCOUNTER — Other Ambulatory Visit: Payer: Self-pay | Admitting: Internal Medicine

## 2016-07-30 ENCOUNTER — Other Ambulatory Visit: Payer: Self-pay | Admitting: Internal Medicine

## 2016-07-30 DIAGNOSIS — F329 Major depressive disorder, single episode, unspecified: Secondary | ICD-10-CM

## 2016-07-30 DIAGNOSIS — F32A Depression, unspecified: Secondary | ICD-10-CM

## 2016-08-05 ENCOUNTER — Encounter: Payer: Self-pay | Admitting: Internal Medicine

## 2016-08-05 ENCOUNTER — Ambulatory Visit (INDEPENDENT_AMBULATORY_CARE_PROVIDER_SITE_OTHER): Payer: Medicare HMO | Admitting: Internal Medicine

## 2016-08-05 ENCOUNTER — Ambulatory Visit
Admission: RE | Admit: 2016-08-05 | Discharge: 2016-08-05 | Disposition: A | Discharge status: Home / Self Care | Payer: Medicare HMO | Source: Ambulatory Visit | Attending: Internal Medicine | Admitting: Internal Medicine

## 2016-08-05 ENCOUNTER — Other Ambulatory Visit: Payer: Self-pay | Admitting: Internal Medicine

## 2016-08-05 VITALS — BP 142/64 | HR 81 | Ht 62.0 in | Wt 178.0 lb

## 2016-08-05 DIAGNOSIS — I739 Peripheral vascular disease, unspecified: Secondary | ICD-10-CM

## 2016-08-05 DIAGNOSIS — Z0000 Encounter for general adult medical examination without abnormal findings: Secondary | ICD-10-CM

## 2016-08-05 DIAGNOSIS — H9311 Tinnitus, right ear: Secondary | ICD-10-CM | POA: Diagnosis not present

## 2016-08-05 DIAGNOSIS — Z1231 Encounter for screening mammogram for malignant neoplasm of breast: Secondary | ICD-10-CM

## 2016-08-05 DIAGNOSIS — E1122 Type 2 diabetes mellitus with diabetic chronic kidney disease: Secondary | ICD-10-CM | POA: Diagnosis not present

## 2016-08-05 DIAGNOSIS — E1151 Type 2 diabetes mellitus with diabetic peripheral angiopathy without gangrene: Secondary | ICD-10-CM | POA: Diagnosis not present

## 2016-08-05 DIAGNOSIS — I10 Essential (primary) hypertension: Secondary | ICD-10-CM

## 2016-08-05 DIAGNOSIS — M542 Cervicalgia: Secondary | ICD-10-CM | POA: Insufficient documentation

## 2016-08-05 DIAGNOSIS — M5031 Other cervical disc degeneration,  high cervical region: Secondary | ICD-10-CM | POA: Diagnosis not present

## 2016-08-05 DIAGNOSIS — N183 Chronic kidney disease, stage 3 (moderate): Secondary | ICD-10-CM | POA: Diagnosis not present

## 2016-08-05 DIAGNOSIS — E782 Mixed hyperlipidemia: Secondary | ICD-10-CM | POA: Diagnosis not present

## 2016-08-05 LAB — POCT URINALYSIS DIPSTICK
BILIRUBIN UA: NEGATIVE
Blood, UA: NEGATIVE
Glucose, UA: NEGATIVE
KETONES UA: NEGATIVE
NITRITE UA: NEGATIVE
PROTEIN UA: NEGATIVE
Spec Grav, UA: 1.02 (ref 1.010–1.025)
Urobilinogen, UA: 0.2 E.U./dL
pH, UA: 5 (ref 5.0–8.0)

## 2016-08-05 MED ORDER — MECLIZINE HCL 12.5 MG PO TABS: 12.5000 mg | ORAL_TABLET | Freq: Three times a day (TID) | ORAL | 0 refills | Status: DC | PRN

## 2016-08-05 NOTE — Addendum Note (Signed)
Addended by: Glean Hess on: 08/05/2016 12:03 PM   Modules accepted: Orders

## 2016-08-05 NOTE — Patient Instructions (Signed)
Reduce metformin to one tablet per day.  I will let you know if we need to add another medication.  Shingrix - shingles vaccine - ask you pharmacist about this.   Health Maintenance  Topic Date Due  . TETANUS/TDAP  10/04/1968  . COLONOSCOPY  01/22/2010  . DEXA SCAN  10/05/2014  . OPHTHALMOLOGY EXAM  08/04/2015  . MAMMOGRAM  02/17/2016  . FOOT EXAM  08/02/2016  . HEMOGLOBIN A1C  08/04/2016  . INFLUENZA VACCINE  08/21/2016  . PNA vac Low Risk Adult (2 of 2 - PPSV23) 12/15/2017  . Hepatitis C Screening  Addressed
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
## 2016-08-06 LAB — COMPREHENSIVE METABOLIC PANEL
A/G RATIO: 1.8 (ref 1.2–2.2)
ALK PHOS: 85 IU/L (ref 39–117)
ALT: 11 IU/L (ref 0–32)
AST: 13 IU/L (ref 0–40)
Albumin: 4.3 g/dL (ref 3.6–4.8)
BILIRUBIN TOTAL: 1 mg/dL (ref 0.0–1.2)
BUN/Creatinine Ratio: 16 (ref 12–28)
BUN: 21 mg/dL (ref 8–27)
CHLORIDE: 103 mmol/L (ref 96–106)
CO2: 21 mmol/L (ref 20–29)
Calcium: 8.6 mg/dL — ABNORMAL LOW (ref 8.7–10.3)
Creatinine, Ser: 1.33 mg/dL — ABNORMAL HIGH (ref 0.57–1.00)
GFR calc non Af Amer: 42 mL/min/{1.73_m2} — ABNORMAL LOW (ref >59)
GFR, EST AFRICAN AMERICAN: 48 mL/min/{1.73_m2} — AB (ref >59)
GLUCOSE: 104 mg/dL — AB (ref 65–99)
Globulin, Total: 2.4 g/dL (ref 1.5–4.5)
POTASSIUM: 5 mmol/L (ref 3.5–5.2)
Sodium: 140 mmol/L (ref 134–144)
Total Protein: 6.7 g/dL (ref 6.0–8.5)

## 2016-08-06 LAB — CBC WITH DIFFERENTIAL/PLATELET
BASOS ABS: 0 10*3/uL (ref 0.0–0.2)
BASOS: 1 %
EOS (ABSOLUTE): 0.2 10*3/uL (ref 0.0–0.4)
Eos: 2 %
Hematocrit: 34.2 % (ref 34.0–46.6)
Hemoglobin: 11.5 g/dL (ref 11.1–15.9)
Immature Grans (Abs): 0 10*3/uL (ref 0.0–0.1)
Immature Granulocytes: 0 %
Lymphocytes Absolute: 1.6 10*3/uL (ref 0.7–3.1)
Lymphs: 21 %
MCH: 29.9 pg (ref 26.6–33.0)
MCHC: 33.6 g/dL (ref 31.5–35.7)
MCV: 89 fL (ref 79–97)
MONOS ABS: 0.7 10*3/uL (ref 0.1–0.9)
Monocytes: 9 %
NEUTROS ABS: 5 10*3/uL (ref 1.4–7.0)
Neutrophils: 67 %
PLATELETS: 273 10*3/uL (ref 150–379)
RBC: 3.85 x10E6/uL (ref 3.77–5.28)
RDW: 13.7 % (ref 12.3–15.4)
WBC: 7.5 10*3/uL (ref 3.4–10.8)

## 2016-08-06 LAB — LIPID PANEL
CHOLESTEROL TOTAL: 175 mg/dL (ref 100–199)
Chol/HDL Ratio: 3.6 ratio (ref 0.0–4.4)
HDL: 48 mg/dL (ref >39)
LDL CALC: 82 mg/dL (ref 0–99)
Triglycerides: 224 mg/dL — ABNORMAL HIGH (ref 0–149)
VLDL Cholesterol Cal: 45 mg/dL — ABNORMAL HIGH (ref 5–40)

## 2016-08-06 LAB — TSH: TSH: 2.06 u[IU]/mL (ref 0.450–4.500)

## 2016-08-06 LAB — HEMOGLOBIN A1C
ESTIMATED AVERAGE GLUCOSE: 137 mg/dL
HEMOGLOBIN A1C: 6.4 % — AB (ref 4.8–5.6)

## 2016-08-10 DIAGNOSIS — E119 Type 2 diabetes mellitus without complications: Secondary | ICD-10-CM | POA: Diagnosis not present

## 2016-08-12 LAB — MICROALBUMIN / CREATININE URINE RATIO
Creatinine, Urine: 187.2 mg/dL
MICROALB/CREAT RATIO: 8.6 mg/g{creat} (ref 0.0–30.0)
Microalbumin, Urine: 16.1 ug/mL

## 2016-09-03 DIAGNOSIS — R42 Dizziness and giddiness: Secondary | ICD-10-CM | POA: Diagnosis not present

## 2016-09-03 DIAGNOSIS — H903 Sensorineural hearing loss, bilateral: Secondary | ICD-10-CM | POA: Diagnosis not present

## 2016-09-13 DIAGNOSIS — R42 Dizziness and giddiness: Secondary | ICD-10-CM | POA: Diagnosis not present

## 2016-09-27 DIAGNOSIS — R2681 Unsteadiness on feet: Secondary | ICD-10-CM | POA: Diagnosis not present

## 2016-09-27 DIAGNOSIS — H81399 Other peripheral vertigo, unspecified ear: Secondary | ICD-10-CM | POA: Diagnosis not present

## 2016-10-08 DIAGNOSIS — H81399 Other peripheral vertigo, unspecified ear: Secondary | ICD-10-CM | POA: Diagnosis not present

## 2016-10-08 DIAGNOSIS — R2681 Unsteadiness on feet: Secondary | ICD-10-CM | POA: Diagnosis not present

## 2016-10-22 DIAGNOSIS — H81399 Other peripheral vertigo, unspecified ear: Secondary | ICD-10-CM | POA: Diagnosis not present

## 2016-10-22 DIAGNOSIS — R2681 Unsteadiness on feet: Secondary | ICD-10-CM | POA: Diagnosis not present

## 2016-11-09 DIAGNOSIS — E119 Type 2 diabetes mellitus without complications: Secondary | ICD-10-CM | POA: Diagnosis not present

## 2016-12-03 ENCOUNTER — Observation Stay: Payer: Medicare HMO

## 2016-12-03 ENCOUNTER — Observation Stay
Admission: EM | Admit: 2016-12-03 | Discharge: 2016-12-04 | Disposition: A | Discharge status: Home / Self Care | Payer: Medicare HMO | Attending: Internal Medicine | Admitting: Internal Medicine

## 2016-12-03 ENCOUNTER — Other Ambulatory Visit: Payer: Self-pay

## 2016-12-03 ENCOUNTER — Emergency Department: Payer: Medicare HMO

## 2016-12-03 ENCOUNTER — Encounter: Payer: Self-pay | Admitting: Emergency Medicine

## 2016-12-03 DIAGNOSIS — I10 Essential (primary) hypertension: Secondary | ICD-10-CM | POA: Diagnosis not present

## 2016-12-03 DIAGNOSIS — N183 Chronic kidney disease, stage 3 (moderate): Secondary | ICD-10-CM | POA: Insufficient documentation

## 2016-12-03 DIAGNOSIS — G459 Transient cerebral ischemic attack, unspecified: Secondary | ICD-10-CM | POA: Diagnosis not present

## 2016-12-03 DIAGNOSIS — E1151 Type 2 diabetes mellitus with diabetic peripheral angiopathy without gangrene: Secondary | ICD-10-CM | POA: Insufficient documentation

## 2016-12-03 DIAGNOSIS — Z7982 Long term (current) use of aspirin: Secondary | ICD-10-CM | POA: Diagnosis not present

## 2016-12-03 DIAGNOSIS — R4781 Slurred speech: Secondary | ICD-10-CM | POA: Diagnosis present

## 2016-12-03 DIAGNOSIS — Z881 Allergy status to other antibiotic agents status: Secondary | ICD-10-CM | POA: Diagnosis not present

## 2016-12-03 DIAGNOSIS — R29700 NIHSS score 0: Secondary | ICD-10-CM | POA: Diagnosis not present

## 2016-12-03 DIAGNOSIS — Z7902 Long term (current) use of antithrombotics/antiplatelets: Secondary | ICD-10-CM | POA: Insufficient documentation

## 2016-12-03 DIAGNOSIS — E119 Type 2 diabetes mellitus without complications: Secondary | ICD-10-CM | POA: Diagnosis not present

## 2016-12-03 DIAGNOSIS — Z95820 Peripheral vascular angioplasty status with implants and grafts: Secondary | ICD-10-CM | POA: Diagnosis not present

## 2016-12-03 DIAGNOSIS — Z7984 Long term (current) use of oral hypoglycemic drugs: Secondary | ICD-10-CM | POA: Insufficient documentation

## 2016-12-03 DIAGNOSIS — E785 Hyperlipidemia, unspecified: Secondary | ICD-10-CM | POA: Diagnosis not present

## 2016-12-03 DIAGNOSIS — E1122 Type 2 diabetes mellitus with diabetic chronic kidney disease: Secondary | ICD-10-CM | POA: Diagnosis not present

## 2016-12-03 DIAGNOSIS — Z87891 Personal history of nicotine dependence: Secondary | ICD-10-CM | POA: Diagnosis not present

## 2016-12-03 DIAGNOSIS — Z882 Allergy status to sulfonamides status: Secondary | ICD-10-CM | POA: Insufficient documentation

## 2016-12-03 DIAGNOSIS — Z79899 Other long term (current) drug therapy: Secondary | ICD-10-CM | POA: Diagnosis not present

## 2016-12-03 DIAGNOSIS — I129 Hypertensive chronic kidney disease with stage 1 through stage 4 chronic kidney disease, or unspecified chronic kidney disease: Secondary | ICD-10-CM | POA: Diagnosis not present

## 2016-12-03 DIAGNOSIS — R51 Headache: Secondary | ICD-10-CM | POA: Diagnosis not present

## 2016-12-03 HISTORY — DX: Slurred speech: R47.81

## 2016-12-03 LAB — COMPREHENSIVE METABOLIC PANEL
ALT: 14 U/L (ref 14–54)
ANION GAP: 11 (ref 5–15)
AST: 20 U/L (ref 15–41)
Albumin: 4.3 g/dL (ref 3.5–5.0)
Alkaline Phosphatase: 71 U/L (ref 38–126)
BUN: 25 mg/dL — ABNORMAL HIGH (ref 6–20)
CHLORIDE: 104 mmol/L (ref 101–111)
CO2: 23 mmol/L (ref 22–32)
Calcium: 9.6 mg/dL (ref 8.9–10.3)
Creatinine, Ser: 1.16 mg/dL — ABNORMAL HIGH (ref 0.44–1.00)
GFR, EST AFRICAN AMERICAN: 55 mL/min — AB (ref >60)
GFR, EST NON AFRICAN AMERICAN: 48 mL/min — AB (ref >60)
Glucose, Bld: 146 mg/dL — ABNORMAL HIGH (ref 65–99)
POTASSIUM: 4.4 mmol/L (ref 3.5–5.1)
SODIUM: 138 mmol/L (ref 135–145)
Total Bilirubin: 0.8 mg/dL (ref 0.3–1.2)
Total Protein: 7.3 g/dL (ref 6.5–8.1)

## 2016-12-03 LAB — CBC
HEMATOCRIT: 35.3 % (ref 35.0–47.0)
Hemoglobin: 12.1 g/dL (ref 12.0–16.0)
MCH: 30.1 pg (ref 26.0–34.0)
MCHC: 34.1 g/dL (ref 32.0–36.0)
MCV: 88.1 fL (ref 80.0–100.0)
PLATELETS: 287 10*3/uL (ref 150–440)
RBC: 4.01 MIL/uL (ref 3.80–5.20)
RDW: 13.2 % (ref 11.5–14.5)
WBC: 7.3 10*3/uL (ref 3.6–11.0)

## 2016-12-03 LAB — DIFFERENTIAL
BASOS ABS: 0.1 10*3/uL (ref 0–0.1)
BASOS PCT: 1 %
EOS ABS: 0.1 10*3/uL (ref 0–0.7)
Eosinophils Relative: 2 %
Lymphocytes Relative: 25 %
Lymphs Abs: 1.8 10*3/uL (ref 1.0–3.6)
MONO ABS: 0.6 10*3/uL (ref 0.2–0.9)
MONOS PCT: 8 %
Neutro Abs: 4.7 10*3/uL (ref 1.4–6.5)
Neutrophils Relative %: 64 %

## 2016-12-03 LAB — PROTIME-INR
INR: 0.85
PROTHROMBIN TIME: 11.5 s (ref 11.4–15.2)

## 2016-12-03 LAB — TROPONIN I: Troponin I: <0.03 ng/mL (ref <0.03)

## 2016-12-03 LAB — APTT: aPTT: 26 seconds (ref 24–36)

## 2016-12-03 MED ORDER — MECLIZINE HCL 12.5 MG PO TABS
12.5000 mg | ORAL_TABLET | Freq: Three times a day (TID) | ORAL | Status: DC | PRN
  Filled 2016-12-03: qty 1

## 2016-12-03 MED ORDER — SERTRALINE HCL 50 MG PO TABS
50.0000 mg | ORAL_TABLET | Freq: Every day | ORAL | Status: DC
  Administered 2016-12-04: 09:00:00 50 mg via ORAL
  Filled 2016-12-03: qty 1

## 2016-12-03 MED ORDER — ACETAMINOPHEN 160 MG/5ML PO SOLN
650.0000 mg | ORAL | Status: DC | PRN
  Filled 2016-12-03: qty 20.3

## 2016-12-03 MED ORDER — HYDROCHLOROTHIAZIDE 12.5 MG PO CAPS
12.5000 mg | ORAL_CAPSULE | Freq: Every day | ORAL | Status: DC
  Administered 2016-12-04: 09:00:00 12.5 mg via ORAL
  Filled 2016-12-03: qty 1

## 2016-12-03 MED ORDER — CLOPIDOGREL BISULFATE 75 MG PO TABS
75.0000 mg | ORAL_TABLET | Freq: Every day | ORAL | Status: DC
Start: 2016-12-04 — End: 2016-12-04
  Administered 2016-12-04: 09:00:00 75 mg via ORAL
  Filled 2016-12-03: qty 1

## 2016-12-03 MED ORDER — ATORVASTATIN CALCIUM 20 MG PO TABS
40.0000 mg | ORAL_TABLET | Freq: Every day | ORAL | Status: DC
  Administered 2016-12-04 (×2): 40 mg via ORAL
  Filled 2016-12-03 (×2): qty 2

## 2016-12-03 MED ORDER — ACETAMINOPHEN 325 MG PO TABS: 650.0000 mg | ORAL_TABLET | ORAL | Status: DC | PRN

## 2016-12-03 MED ORDER — INSULIN ASPART 100 UNIT/ML ~~LOC~~ SOLN
0.0000 [IU] | Freq: Three times a day (TID) | SUBCUTANEOUS | Status: DC
  Administered 2016-12-04: 1 [IU] via SUBCUTANEOUS
  Filled 2016-12-03: qty 1

## 2016-12-03 MED ORDER — ENOXAPARIN SODIUM 40 MG/0.4ML ~~LOC~~ SOLN
40.0000 mg | SUBCUTANEOUS | Status: DC
  Administered 2016-12-04: 40 mg via SUBCUTANEOUS
  Filled 2016-12-03: qty 0.4

## 2016-12-03 MED ORDER — LORATADINE 10 MG PO TABS
10.0000 mg | ORAL_TABLET | Freq: Every day | ORAL | Status: DC
Start: 2016-12-04 — End: 2016-12-04
  Administered 2016-12-04: 10 mg via ORAL
  Filled 2016-12-03: qty 1

## 2016-12-03 MED ORDER — ASPIRIN EC 81 MG PO TBEC
81.0000 mg | DELAYED_RELEASE_TABLET | Freq: Every day | ORAL | Status: DC
  Administered 2016-12-04: 81 mg via ORAL
  Filled 2016-12-03: qty 1

## 2016-12-03 MED ORDER — INSULIN ASPART 100 UNIT/ML ~~LOC~~ SOLN: 0.0000 [IU] | Freq: Every day | SUBCUTANEOUS | Status: DC

## 2016-12-03 MED ORDER — LISINOPRIL 20 MG PO TABS
20.0000 mg | ORAL_TABLET | Freq: Every day | ORAL | Status: DC
  Administered 2016-12-04: 20 mg via ORAL
  Filled 2016-12-03: qty 1

## 2016-12-03 MED ORDER — STROKE: EARLY STAGES OF RECOVERY BOOK
Freq: Once | Status: AC
  Administered 2016-12-04: 04:00:00

## 2016-12-03 MED ORDER — ASPIRIN 81 MG PO CHEW
324.0000 mg | CHEWABLE_TABLET | Freq: Once | ORAL | Status: AC
  Administered 2016-12-03: 324 mg via ORAL
  Filled 2016-12-03: qty 4

## 2016-12-03 MED ORDER — ACETAMINOPHEN 650 MG RE SUPP: 650.0000 mg | RECTAL | Status: DC | PRN

## 2016-12-03 MED ORDER — LISINOPRIL-HYDROCHLOROTHIAZIDE 20-12.5 MG PO TABS
1.0000 | ORAL_TABLET | Freq: Every day | ORAL | Status: DC
Start: 2016-12-03 — End: 2016-12-03

## 2016-12-03 NOTE — Care Management Note (Signed)
Updated patient PTA medication history. Patient has not had evening metformin dose as she has not eaten.

## 2016-12-03 NOTE — ED Notes (Signed)
Tried to call report.

## 2016-12-03 NOTE — ED Notes (Signed)
Pt speaking to MRI on phone for screening.

## 2016-12-03 NOTE — H&P (Signed)
Misty Villarreal at [place] NAME: Misty Villarreal    MR#:  [id number]  DATE OF BIRTH:  Jun 10, 1950  DATE OF ADMISSION:  12/03/2016  PRIMARY CARE PHYSICIAN: Glean Hess, MD   REQUESTING/REFERRING PHYSICIAN: Dr. Harvest Dark  CHIEF COMPLAINT: Headache   Chief Complaint  Patient presents with  . Headache    HISTORY OF PRESENT ILLNESS:  Misty Villarreal  is a 66 y.o. female with a known history of essential hypertension, diabetes mellitus type 2, peripheral artery disease with stents in both left leg, came in because of headache today.  Patient had some blurred vision, numbness of the left hand and also left side of the face around mouth area on Saturday and symptoms persisted for 15 minutes, that time patient also had word finding difficulties.  Symptoms reoccurred today morning associated with headache, slurred speech.  Patient called PCP and she  told patient to go to ER.  , now has no headache, blurred vision, no weakness in hands or legs.  PAST MEDICAL HISTORY:  History reviewed. No pertinent past medical history.  PAST SURGICAL HISTOIRY:   Past Surgical History:  Procedure Laterality Date  . ANGIOPLASTY / STENTING FEMORAL Left 2014, 2013  . BRAIN MENINGIOMA EXCISION  1988  . BREAST BIOPSY Right 1989   benign  . CARPAL TUNNEL RELEASE Left 1980  . CHOLECYSTECTOMY  1987  . KNEE ARTHROPLASTY Right 2009    SOCIAL HISTORY:   Social History   Tobacco Use  . Smoking status: Former Research scientist (life sciences)  . Smokeless tobacco: Former Systems developer    Quit date: 12/21/2013  Substance Use Topics  . Alcohol use: Yes    Alcohol/week: 1.2 oz    Types: 2 Standard drinks or equivalent per week    Comment: occasional    FAMILY HISTORY:   Family History  Problem Relation Age of Onset  . Diabetes Mother   . Heart failure Father   . Diabetes Father     DRUG ALLERGIES:   Allergies  Allergen Reactions  . Clavulanic Acid Diarrhea  . Tetracycline     Other reaction(s): emesis  . Cefaclor Rash  . Cephalexin Rash  . Sulfa Antibiotics Rash    Other reaction(s): emesis    REVIEW OF SYSTEMS:  CONSTITUTIONAL: No fever, fatigue or weakness.  EYES: No blurred or double vision.  EARS, NOSE, AND THROAT: No tinnitus or ear pain.  RESPIRATORY: No cough, shortness of breath, wheezing or hemoptysis.  CARDIOVASCULAR: No chest pain, orthopnea, edema.  GASTROINTESTINAL: No nausea, vomiting, diarrhea or abdominal pain.  GENITOURINARY: No dysuria, hematuria.  ENDOCRINE: No polyuria, nocturia,  HEMATOLOGY: No anemia, easy bruising or bleeding SKIN: No rash or lesion. MUSCULOSKELETAL: No joint pain or arthritis.   NEUROLOGIC: No tingling, numbness, weakness.  PSYCHIATRY: No anxiety or depression.   MEDICATIONS AT HOME:   Prior to Admission medications   Medication Sig Start Date End Date Taking? Authorizing Provider  aspirin 81 MG tablet Take 1 tablet by mouth daily.   Yes [provider]  clopidogrel (PLAVIX) 75 MG tablet TAKE 1 TABLET DAILY 01/30/16  Yes Stegmayer, Janalyn Harder, PA-C  fexofenadine (ALLEGRA) 180 MG tablet Take 180 mg by mouth daily.   Yes [provider]  lisinopril-hydrochlorothiazide (PRINZIDE,ZESTORETIC) 20-12.5 MG tablet TAKE 1 TABLET DAILY 01/30/16  Yes Glean Hess, MD  meclizine (ANTIVERT) 12.5 MG tablet Take 1 tablet (12.5 mg total) by mouth 3 (three) times daily as needed for dizziness. 08/05/16  Yes Army Melia,  Jesse Sans, MD  metFORMIN (GLUCOPHAGE) 500 MG tablet TAKE 1 TABLET TWICE A DAY 05/15/16  Yes Glean Hess, MD  MULTIPLE VITAMIN PO Take by mouth.   Yes [provider]  sertraline (ZOLOFT) 50 MG tablet TAKE 1 TABLET DAILY 07/30/16  Yes Glean Hess, MD  simvastatin (ZOCOR) 80 MG tablet TAKE 1 TABLET AT BEDTIME 10/30/15  Yes Glean Hess, MD      VITAL SIGNS:  Blood pressure 131/77, pulse 66, temperature 97.8 F (36.6 C), temperature source Oral, resp. rate (!) 21, height 5\' 2"   (1.575 m), weight 79.4 kg (175 lb), SpO2 100 %.  PHYSICAL EXAMINATION:  GENERAL:  66 y.o.-year-old patient lying in the bed with no acute distress.  EYES: Pupils equal, round, reactive to light and accommodation. No scleral icterus. Extraocular muscles intact.  HEENT: Head atraumatic, normocephalic. Oropharynx and nasopharynx clear.  NECK:  Supple, no jugular venous distention. No thyroid enlargement, no tenderness.  LUNGS: Normal breath sounds bilaterally, no wheezing, rales,rhonchi or crepitation. No use of accessory muscles of respiration.  CARDIOVASCULAR: S1, S2 normal. No murmurs, rubs, or gallops.  ABDOMEN: Soft, nontender, nondistended. Bowel sounds present. No organomegaly or mass.  EXTREMITIES: No pedal edema, cyanosis, or clubbing.  NEUROLOGIC: Cranial nerves II through XII are intact. Muscle strength 5/5 in all extremities. Sensation intact. Gait not checked.  PSYCHIATRIC: The patient is alert and oriented x 3.  SKIN: No obvious rash, lesion, or ulcer.   LABORATORY PANEL:   CBC Recent Labs  Lab 12/03/16 1625  WBC 7.3  HGB 12.1  HCT 35.3  PLT 287   ------------------------------------------------------------------------------------------------------------------  Chemistries  Recent Labs  Lab 12/03/16 1625  NA 138  K 4.4  CL 104  CO2 23  GLUCOSE 146*  BUN 25*  CREATININE 1.16*  CALCIUM 9.6  AST 20  ALT 14  ALKPHOS 71  BILITOT 0.8   ------------------------------------------------------------------------------------------------------------------  Cardiac Enzymes Recent Labs  Lab 12/03/16 1625  TROPONINI <0.03   ------------------------------------------------------------------------------------------------------------------  RADIOLOGY:  Ct Head Wo Contrast  Result Date: 12/03/2016 CLINICAL DATA:  Acute headache. EXAM: CT HEAD WITHOUT CONTRAST TECHNIQUE: Contiguous axial images were obtained from the base of the skull through the vertex without  intravenous contrast. COMPARISON:  None. FINDINGS: Brain: Mild chronic ischemic white matter disease is noted. No mass effect or midline shift is noted. Ventricular size is within normal limits. There is no evidence of mass lesion, hemorrhage or acute infarction. Vascular: No hyperdense vessel or unexpected calcification. Skull: Status post occipital craniotomy. No acute abnormality is noted. Sinuses/Orbits: No acute finding. Other: None. IMPRESSION: Mild chronic ischemic white matter disease. No acute intracranial abnormality seen. Electronically Signed   By: Marijo Conception, M.D.   On: 12/03/2016 16:54    EKG:   Orders placed or performed during the hospital encounter of 12/03/16  . ED EKG  . ED EKG  EKG shows normal sinus rhythm with no ST-T changes, 72 bpm.  IMPRESSION AND PLAN:   66 year old female patient with history of hypertension, diabetes, PAD status post stent in the left leg comes in with transient blurred vision, slurred speech, symptoms recurred about 2-3 times over the past 3 days.  Admit her to hospital on stroke unit, patient NIH stroke scale is 0 at this time, check MRI and MRA of the brain and neck, echocardiogram, check hemoglobin A1c, lipid panel, continue aspirin, Plavix, statins, physical therapy consult, speech therapy consult, neurology consult, follow NIH stroke scale. #2 chronic kidney disease stage III: Patient  follows up with nephrology, creatinine 1.16. 3.  Diabetes mellitus type 2: Patient takes metformin.  If she passes swallow evaluation restart the metformin. 4.  History of  peripheral artery disease: Patient had stent in left leg.  On aspirin, Plavix, statins.  D/w husband All the records are reviewed and case discussed with ED provider. Management plans discussed with the patient, family and they are in agreement.  CODE STATUS: full  TOTAL TIME TAKING CARE OF THIS PATIENT: 55 minutes.    Epifanio Lesches M.D on 12/03/2016 at 7:19 PM  Between 7am to  6pm - Pager - [phone]  After 6pm go to [email or web address] - password [email or web address] [facility]  Office  [phone]  CC: Primary care physician; Glean Hess, MD  Note: This dictation was prepared with Dragon dictation along with smaller phrase technology. Any transcriptional errors that result from this process are unintentional.

## 2016-12-03 NOTE — ED Provider Notes (Signed)
[facility] Emergency Department Provider Note  Time seen: 6:15 PM  I have reviewed the triage vital signs and the nursing notes.   HISTORY  Chief Complaint Headache    HPI Misty Villarreal is a 66 y.o. female with a past medical history of diabetes, hyperlipidemia, hypertension, presents to the emergency department for blurred vision left hand numbness left facial numbness and difficulty speaking.  According to the patient on Saturday, 3 days ago, the patient first started with symptoms while at a basketball game.  States her vision became somewhat blurred she felt tingling and numbness in her left arm and left face.  She states during this episode she was attempting to talk to her daughter but the daughter could not understand anything she was saying.  Daughter told her that it was all jumbled up.  Patient thought she was talking/speaking properly.  The patient states she then improved and got better however today symptoms recurred once again where she had left-sided symptoms and could not speak correctly, symptoms lasted briefly and then resolved on their own.  She called her doctor to be seen and they recommended she come to the ER for evaluation.  Here the patient states all of her symptoms have resolved and she is back to normal.  History reviewed. No pertinent past medical history.  Patient Active Problem List   Diagnosis Date Noted  . Neck pain on right side 08/05/2016  . Tinnitus of right ear 08/05/2016  . Type 2 diabetes mellitus with stage 3 chronic kidney disease, without long-term current use of insulin ([place]) 08/03/2015  . Anemia 06/24/2015  . Localized edema 06/21/2015  . Tachycardia, paroxysmal ([place]) 06/21/2015  . PAD (peripheral artery disease) ([place]) 02/02/2015  . Angiopathy, peripheral ([place]) 09/13/2014  . Hyperlipidemia 09/01/2014  . Clinical depression 09/01/2014  . Neuropathy 09/01/2014  . Phlebectasia 09/01/2014  . Abnormal glandular  Papanicolaou smear of cervix 09/01/2014  . DM (diabetes mellitus), type 2 with peripheral vascular complications ([place]) 53/29/9242  . Essential (primary) hypertension 09/01/2014  . Spondylolisthesis at L4-L5 level 07/05/2014  . Arthritis, degenerative 01/31/2014    Past Surgical History:  Procedure Laterality Date  . ANGIOPLASTY / STENTING FEMORAL Left 2014, 2013  . BRAIN MENINGIOMA EXCISION  1988  . BREAST BIOPSY Right 1989   benign  . CARPAL TUNNEL RELEASE Left 1980  . CHOLECYSTECTOMY  1987  . KNEE ARTHROPLASTY Right 2009    Prior to Admission medications   Medication Sig Start Date End Date Taking? Authorizing Provider  aspirin 81 MG tablet Take 1 tablet by mouth daily.    [provider]  clopidogrel (PLAVIX) 75 MG tablet TAKE 1 TABLET DAILY 01/30/16   Stegmayer, [name] A, PA-C  fexofenadine (ALLEGRA) 180 MG tablet Take 180 mg by mouth daily.    [provider]  furosemide (LASIX) 20 MG tablet Take 0.5-1 tablets (10-20 mg total) by mouth daily as needed. 06/21/15   Glean Hess, MD  lisinopril-hydrochlorothiazide (PRINZIDE,ZESTORETIC) 20-12.5 MG tablet TAKE 1 TABLET DAILY 01/30/16   Glean Hess, MD  meclizine (ANTIVERT) 12.5 MG tablet Take 1 tablet (12.5 mg total) by mouth 3 (three) times daily as needed for dizziness. 08/05/16   Glean Hess, MD  metFORMIN (GLUCOPHAGE) 500 MG tablet TAKE 1 TABLET TWICE A DAY 05/15/16   Glean Hess, MD  MULTIPLE VITAMIN PO Take by mouth.    [provider]  sertraline (ZOLOFT) 50 MG tablet TAKE 1 TABLET DAILY 07/30/16   Army Melia,  Jesse Sans, MD  simvastatin (ZOCOR) 80 MG tablet TAKE 1 TABLET AT BEDTIME 10/30/15   Glean Hess, MD    Allergies  Allergen Reactions  . Clavulanic Acid Diarrhea  . Tetracycline     Other reaction(s): emesis  . Cefaclor Rash  . Cephalexin Rash  . Sulfa Antibiotics Rash    Other reaction(s): emesis    Family History  Problem Relation Age of Onset  . Diabetes Mother    . Heart failure Father   . Diabetes Father     Social History Social History   Tobacco Use  . Smoking status: Former Research scientist (life sciences)  . Smokeless tobacco: Former Systems developer    Quit date: 12/21/2013  Substance Use Topics  . Alcohol use: Yes    Alcohol/week: 1.2 oz    Types: 2 Standard drinks or equivalent per week    Comment: occasional  . Drug use: No    Review of Systems Constitutional: Negative for fever Cardiovascular: Negative for chest pain. Respiratory: Negative for shortness of breath. Gastrointestinal: Negative for abdominal pain Genitourinary: Negative for dysuria. Neurological: Negative for headache.  Denies any weakness or numbness currently.  Was having left arm and left face numbness/tingling. All other ROS negative  ____________________________________________   PHYSICAL EXAM:  VITAL SIGNS: ED Triage Vitals  Enc Vitals Group     BP 12/03/16 1620 (!) 147/75     Pulse Rate 12/03/16 1620 74     Resp 12/03/16 1620 16     Temp 12/03/16 1620 97.8 F (36.6 C)     Temp Source 12/03/16 1620 Oral     SpO2 12/03/16 1620 99 %     Weight 12/03/16 1618 175 lb (79.4 kg)     Height 12/03/16 1618 5\' 2"  (1.575 m)     Head Circumference --      Peak Flow --      Pain Score 12/03/16 1617 6     Pain Loc --      Pain Edu? --      Excl. in [place]? --    Constitutional: Alert and oriented. Well appearing and in no distress. Eyes: Normal exam ENT   Head: Normocephalic and atraumatic.   Mouth/Throat: Mucous membranes are moist. Cardiovascular: Normal rate, regular rhythm. No murmur Respiratory: Normal respiratory effort without tachypnea nor retractions. Breath sounds are clear Gastrointestinal: Soft and nontender. No distention.  Musculoskeletal: Nontender with normal range of motion in all extremities. Neurologic:  Normal speech and language. No gross focal neurologic deficits.  Equal grip strength bilaterally.  No pronator drift.  No lower extremity drift.  Cranial nerves  intact. Skin:  Skin is warm, dry and intact.  Psychiatric: Mood and affect are normal.   ____________________________________________    EKG  EKG reviewed and interpreted by myself shows normal sinus rhythm at 72 bpm, narrow QRS, normal axis, normal intervals, no concerning ST changes.  ____________________________________________    RADIOLOGY  CT scan is negative  ____________________________________________   INITIAL IMPRESSION / ASSESSMENT AND PLAN / ED COURSE  Pertinent labs & imaging results that were available during my care of the patient were reviewed by me and considered in my medical decision making (see chart for details).  Patient presents to the emergency department intermittent dizziness left-sided symptoms of numbness/tingling and difficulty speaking.  Symptoms have occurred 2-3 times over the past 3 days.  Differential would include CVA, TIA, anxiety, paresthesias.  Patient's labs are largely within normal limits, neurological exam is normal.  CT scan of the head  is negative.  However given the patient's significant symptoms especially with a aphasia I am highly suspicious of a TIA/CVA.  Given the recurrent nature of the symptoms I believe the patient warrants admission to the hospital for further workup including MRI, and possibly carotid duplex/echocardiogram.  Patient is agreeable to this plan of care.  We will dose aspirin in the emergency department.  NIH Stroke Scale   Interval: Baseline Time: 6:19 PM Person Administering Scale: Shamond Skelton  Administer stroke scale items in the order listed. Record performance in each category after each subscale exam. Do not go back and change scores. Follow directions provided for each exam technique. Scores should reflect what the patient does, not what the clinician thinks the patient can do. The clinician should record answers while administering the exam and work quickly. Except where indicated, the patient should  not be coached (i.e., repeated requests to patient to make a special effort).   1a  Level of consciousness: 0=alert; keenly responsive  1b. LOC questions:  0=Performs both tasks correctly  1c. LOC commands: 0=Performs both tasks correctly  2.  Best Gaze: 0=normal  3.  Visual: 0=No visual loss  4. Facial Palsy: 0=Normal symmetric movement  5a.  Motor left arm: 0=No drift, limb holds 90 (or 45) degrees for full 10 seconds  5b.  Motor right arm: 0=No drift, limb holds 90 (or 45) degrees for full 10 seconds  6a. motor left leg: 0=No drift, limb holds 90 (or 45) degrees for full 10 seconds  6b  Motor right leg:  0=No drift, limb holds 90 (or 45) degrees for full 10 seconds  7. Limb Ataxia: 0=Absent  8.  Sensory: 0=Normal; no sensory loss  9. Best Language:  0=No aphasia, normal  10. Dysarthria: 0=Normal  11. Extinction and Inattention: 0=No abnormality  12. Distal motor function: 0=Normal   Total:   0    ____________________________________________   FINAL CLINICAL IMPRESSION(S) / ED DIAGNOSES  TIA    Harvest Dark, MD 12/03/16 1820

## 2016-12-03 NOTE — ED Triage Notes (Signed)
Arrives with c/o intermittent episodes of blurred vision, left hand numbness, left facial numbness, and difficulty speaking.  States episodes happened Saturday afternoon, Sunday evening and then again this morning.  Episodes lasting up to one hour. States has had a headache all day. Has history of benign brain tumor.

## 2016-12-04 ENCOUNTER — Observation Stay: Payer: Medicare HMO

## 2016-12-04 ENCOUNTER — Observation Stay
Admit: 2016-12-04 | Discharge: 2016-12-04 | Disposition: A | Discharge status: Home / Self Care | Payer: Medicare HMO | Attending: Internal Medicine | Admitting: Internal Medicine

## 2016-12-04 ENCOUNTER — Other Ambulatory Visit: Payer: Self-pay

## 2016-12-04 DIAGNOSIS — G459 Transient cerebral ischemic attack, unspecified: Secondary | ICD-10-CM | POA: Diagnosis not present

## 2016-12-04 DIAGNOSIS — I10 Essential (primary) hypertension: Secondary | ICD-10-CM | POA: Diagnosis not present

## 2016-12-04 DIAGNOSIS — E119 Type 2 diabetes mellitus without complications: Secondary | ICD-10-CM | POA: Diagnosis not present

## 2016-12-04 LAB — HEMOGLOBIN A1C
HEMOGLOBIN A1C: 6.8 % — AB (ref 4.8–5.6)
MEAN PLASMA GLUCOSE: 148.46 mg/dL

## 2016-12-04 LAB — GLUCOSE, CAPILLARY
Glucose-Capillary: 130 mg/dL — ABNORMAL HIGH (ref 65–99)
Glucose-Capillary: 98 mg/dL (ref 65–99)

## 2016-12-04 LAB — LIPID PANEL
CHOL/HDL RATIO: 4.8 ratio
Cholesterol: 230 mg/dL — ABNORMAL HIGH (ref 0–200)
HDL: 48 mg/dL (ref >40)
LDL Cholesterol: 105 mg/dL — ABNORMAL HIGH (ref 0–99)
Triglycerides: 387 mg/dL — ABNORMAL HIGH (ref <150)
VLDL: 77 mg/dL — ABNORMAL HIGH (ref 0–40)

## 2016-12-04 NOTE — Progress Notes (Signed)
SLP Cancellation Note  Patient Details Name: Tynetta Bachmann MRN: [id number] DOB: 1950/05/13   Cancelled treatment:       Reason Eval/Treat Not Completed: SLP screened, no needs identified, will sign off(chart reviewed; consulted NSG then met w/ pt). Pt denied any difficulty swallowing and is currently on a regular diet; tolerates swallowing pills w/ water per NSG. Pt conversed at conversational level w/out deficits noted; pt denied any speech-language deficits stating she only had a few minutes of "not being able to find the word I wanted to say" but this resolved by the time she came to the ED.  No further skilled ST services indicated as pt appears at her baseline. Pt agreed. NSG to reconsult if any change in status.    Orinda Kenner, [place], CCC-SLP Amalia [place]combe 12/04/2016, 12:03 PM

## 2016-12-04 NOTE — Progress Notes (Signed)
*  PRELIMINARY RESULTS* Echocardiogram 2D Echocardiogram has been performed.  Sherrie Sport 12/04/2016, 2:46 PM

## 2016-12-04 NOTE — Progress Notes (Signed)
OT Cancellation Note  Patient Details Name: Misty Villarreal MRN: [id number] DOB: [birth year]   Cancelled Treatment:    Reason Eval/Treat Not Completed: Patient at procedure or test/ unavailable. Order received, chart reviewed. Pt out of room for testing. Will re-attempt OT evaluation at later date/time as pt is medically appropriate and as schedule permits.   Jeni Salles, MPH, MS, OTR/L ascom [phone] 12/04/16, 10:51 AM

## 2016-12-04 NOTE — Discharge Instructions (Signed)
Ischemic Stroke °An ischemic stroke is the sudden death of brain tissue. Blood carries oxygen to all areas of the body. This type of stroke happens when your blood does not flow to your brain like normal. Your brain cannot get the oxygen it needs. This is an emergency. It must be treated right away. °Symptoms of a stroke usually happen all of a sudden. You may notice them when you wake up. They can include: °· Weakness or loss of feeling in your face, arm, or leg. This often happens on one side of the body. °· Trouble walking. °· Trouble moving your arms or legs. °· Loss of balance or coordination. °· Feeling confused. °· Trouble talking or understanding what people are saying. °· Slurred speech. °· Trouble seeing. °· Seeing two of one object (double vision). °· Feeling dizzy. °· Feeling sick to your stomach (nauseous) and throwing up (vomiting). °· A very bad headache for no reason. ° °Get help as soon as any of these problems start. This is important. Some treatments work better if they are given right away. These include: °· Aspirin. °· Medicines to control blood pressure. °· A shot (injection) of medicine to break up the blood clot. °· Treatments given in the blood vessel (artery) to take out the clot or break it up. ° °Other treatments may include: °· Oxygen. °· Fluids given through an IV tube. °· Medicines to thin out your blood. °· Procedures to help your blood flow better. ° °What increases the risk? °Certain things may make you more likely to have a stroke. Some of these are things that you can change, such as: °· Being very overweight (obesity). °· Smoking. °· Taking birth control pills. °· Not being active. °· Drinking too much alcohol. °· Using drugs. ° °Other risk factors include: °· High blood pressure. °· High cholesterol. °· Diabetes. °· Heart disease. °· Being African American, Native American, Hispanic, or [place] Native. °· Being over age 60. °· Family history of stroke. °· Having had blood clots,  stroke, or warning stroke (transient ischemic attack, TIA) in the past. °· Sickle cell disease. °· Being a woman with a history of high blood pressure in pregnancy (preeclampsia). °· Migraine headache. °· Sleep apnea. °· Having an irregular heartbeat (atrial fibrillation). °· Long-term (chronic) diseases that cause soreness and swelling (inflammation). °· Disorders that affect how your blood clots. ° °Follow these instructions at home: °Medicines °· Take over-the-counter and prescription medicines only as told by your doctor. °· If you were told to take aspirin or another medicine to thin your blood, take it exactly as told by your doctor. °? Taking too much of the medicine can cause bleeding. °? If you do not take enough, it may not work as well. °· Know the side effects of your medicines. If you are taking a blood thinner, make sure you: °? Hold pressure over any cuts for longer than usual. °? Tell your dentist and other doctors that you take this medicine. °? Avoid activities that may cause damage or injury to your body. °Eating and drinking °· Follow instructions from your doctor about what you cannot eat or drink. °· Eat healthy foods. °· If you have trouble with swallowing, do these things to avoid choking: °? Take small bites when eating. °? Eat foods that are soft or pureed. °Safety °· Follow instructions from your health care team about physical activity. °· Use a walker or cane as told by your doctor. °· Keep your home safe so   you do not fall. This may include: °? Having experts look at your home to make sure it is safe. °? Putting grab bars in the bedroom and bathroom. °? Using raised toilets. °? Putting a seat in the shower. °General instructions °· Do not use any tobacco products. °? Examples of these are cigarettes, chewing tobacco, and e-cigarettes. °? If you need help quitting, ask your doctor. °· Limit how much alcohol you drink. This means no more than 1 drink a day for nonpregnant women and 2  drinks a day for men. One drink equals 12 oz of beer, 5 oz of wine, or 1½ oz of hard liquor. °· If you need help to stop using drugs or alcohol, ask your doctor to refer you to a program or specialist. °· Stay active. Exercise as told by your doctor. °· Keep all follow-up visits as told by your doctor. This is important. °Get help right away if: °· You suddenly: °? Have weakness or loss of feeling in your face, arm, or leg. °? Feel confused. °? Have trouble talking or understanding what people are saying. °? Have trouble seeing. °? Have trouble walking. °? Have trouble moving your arms or legs. °? Feel dizzy. °? Lose your balance or coordination. °? Have a very bad headache and you do not know why. °· You pass out (lose consciousness) or almost pass out. °· You have jerky movements that you cannot control (seizure). °These symptoms may be an emergency. Do not wait to see if the symptoms will go away. Get medical help right away. Call your local emergency services (911 in the [place] Do not drive yourself to the hospital. °This information is not intended to replace advice given to you by your health care provider. Make sure you discuss any questions you have with your health care provider. °Document Released: 12/27/2010 Document Revised: 06/20/2015 Document Reviewed: 04/05/2015 °Elsevier Interactive Patient Education © 2018 [employer] ° °

## 2016-12-04 NOTE — Care Management Obs Status (Signed)
[facility] NOTIFICATION   Patient Details  Name: Clorene Nerio MRN: [id number] Date of Birth: [birth year]   Medicare Observation Status Notification Given:  Yes    Shelbie Ammons, RN 12/04/2016, 1:44 PM

## 2016-12-04 NOTE — Progress Notes (Signed)
Pt home at this time via w/c w/o c/o.  Husband picked pt up.   Discharge instructions discussed with pt.no new meds home meds discussed diet / activity and f/u discussed . verbilizes understanding.  Sl d/cd.

## 2016-12-04 NOTE — Progress Notes (Signed)
PT Cancellation Note  Patient Details Name: Misty Villarreal MRN: [id number] DOB: Feb 06, 1950   Cancelled Treatment:    Reason Eval/Treat Not Completed: PT screened, no needs identified, will sign off.  Symptoms have resolved and pt back to baseline.  RN reports that pt has been independent ambulating in her room with no signs of instability.  PT will sign off at this time.   Collie Siad PT, DPT 12/04/2016, 1:06 PM

## 2016-12-04 NOTE — Discharge Summary (Signed)
[facility] [place] NAME: Misty Villarreal    MR#:  [id number]  DATE OF BIRTH:  05-15-1950  DATE OF ADMISSION:  12/03/2016 ADMITTING PHYSICIAN: Epifanio Lesches, MD  DATE OF DISCHARGE: 11/142018  PRIMARY CARE PHYSICIAN: Glean Hess, MD    ADMISSION DIAGNOSIS:  TIA (transient ischemic attack) [G45.9] Slurred speech [R47.81]  DISCHARGE DIAGNOSIS:  Active Problems:   Slurred speech   SECONDARY DIAGNOSIS:  History reviewed. No pertinent past medical history.  HOSPITAL COURSE:   66 year old female with history of diabetes and peripheral arterial disease who presented with blurred vision and numbness of the left face.  1. TIA: Patient's symptoms improved in a short amount time. MRI did not show evidence of CVA. ECHO shows no source of thrombus. It does show stage I diastolic dysfunction. Carotid shows no hemodynamically significant stenosis LDL 105 She will resume aspirin, statin, Plavix  2. Diabetes: She will continue with ADA diet and metformin  3. Essential hypertension: Continue lisinopril/HCTZ 4. Depression: Continue Zoloft     DISCHARGE CONDITIONS AND DIET:   Stable for discharge on heart healthy diabetic diet  CONSULTS OBTAINED:    DRUG ALLERGIES:   Allergies  Allergen Reactions  . Clavulanic Acid Diarrhea  . Tetracycline     Other reaction(s): emesis  . Cefaclor Rash  . Cephalexin Rash  . Sulfa Antibiotics Rash    Other reaction(s): emesis    DISCHARGE MEDICATIONS:   Current Discharge Medication List    CONTINUE these medications which have NOT CHANGED   Details  aspirin 81 MG tablet Take 1 tablet by mouth daily.    clopidogrel (PLAVIX) 75 MG tablet TAKE 1 TABLET DAILY Qty: 90 tablet, Refills: 3    fexofenadine (ALLEGRA) 180 MG tablet Take 180 mg by mouth daily.    lisinopril-hydrochlorothiazide (PRINZIDE,ZESTORETIC) 20-12.5 MG tablet TAKE 1 TABLET DAILY Qty: 90 tablet, Refills: 3    Associated Diagnoses: Essential (primary) hypertension    meclizine (ANTIVERT) 12.5 MG tablet Take 1 tablet (12.5 mg total) by mouth 3 (three) times daily as needed for dizziness. Qty: 30 tablet, Refills: 0    metFORMIN (GLUCOPHAGE) 500 MG tablet TAKE 1 TABLET TWICE A DAY Qty: 180 tablet, Refills: 1    MULTIPLE VITAMIN PO Take by mouth.    sertraline (ZOLOFT) 50 MG tablet TAKE 1 TABLET DAILY Qty: 90 tablet, Refills: 3   Associated Diagnoses: Depression    simvastatin (ZOCOR) 80 MG tablet TAKE 1 TABLET AT BEDTIME Qty: 90 tablet, Refills: 3          Today   CHIEF COMPLAINT:   Patient symptoms have resolved. Patient is doing well this point.   VITAL SIGNS:  Blood pressure (!) 113/52, pulse 77, temperature 97.9 F (36.6 C), temperature source Oral, resp. rate 20, height 5\' 2"  (1.575 m), weight 82.3 kg (181 lb 6.4 oz), SpO2 98 %.   REVIEW OF SYSTEMS:  Review of Systems  Constitutional: Negative.  Negative for chills, fever and malaise/fatigue.  HENT: Negative.  Negative for ear discharge, ear pain, hearing loss, nosebleeds and sore throat.   Eyes: Negative.  Negative for blurred vision and pain.  Respiratory: Negative.  Negative for cough, hemoptysis, shortness of breath and wheezing.   Cardiovascular: Negative.  Negative for chest pain, palpitations and leg swelling.  Gastrointestinal: Negative.  Negative for abdominal pain, blood in stool, diarrhea, nausea and vomiting.  Genitourinary: Negative.  Negative for dysuria.  Musculoskeletal: Negative.  Negative for back pain.  Skin: Negative.  Neurological: Negative for dizziness, tremors, speech change, focal weakness, seizures and headaches.  Endo/Heme/Allergies: Negative.  Does not bruise/bleed easily.  Psychiatric/Behavioral: Negative.  Negative for depression, hallucinations and suicidal ideas.     PHYSICAL EXAMINATION:  GENERAL:  66 y.o.-year-old patient lying in the bed with no acute distress.  NECK:  Supple, no  jugular venous distention. No thyroid enlargement, no tenderness.  LUNGS: Normal breath sounds bilaterally, no wheezing, rales,rhonchi  No use of accessory muscles of respiration.  CARDIOVASCULAR: S1, S2 normal. No murmurs, rubs, or gallops.  ABDOMEN: Soft, non-tender, non-distended. Bowel sounds present. No organomegaly or mass.  EXTREMITIES: No pedal edema, cyanosis, or clubbing.  PSYCHIATRIC: The patient is alert and oriented x 3.  SKIN: No obvious rash, lesion, or ulcer.   DATA REVIEW:   CBC Recent Labs  Lab 12/03/16 1625  WBC 7.3  HGB 12.1  HCT 35.3  PLT 287    Chemistries  Recent Labs  Lab 12/03/16 1625  NA 138  K 4.4  CL 104  CO2 23  GLUCOSE 146*  BUN 25*  CREATININE 1.16*  CALCIUM 9.6  AST 20  ALT 14  ALKPHOS 71  BILITOT 0.8    Cardiac Enzymes Recent Labs  Lab 12/03/16 1625  TROPONINI <0.03    Microbiology Results  @[email or web address] @  RADIOLOGY:  Ct Head Wo Contrast  Result Date: 12/03/2016 CLINICAL DATA:  Acute headache. EXAM: CT HEAD WITHOUT CONTRAST TECHNIQUE: Contiguous axial images were obtained from the base of the skull through the vertex without intravenous contrast. COMPARISON:  None. FINDINGS: Brain: Mild chronic ischemic white matter disease is noted. No mass effect or midline shift is noted. Ventricular size is within normal limits. There is no evidence of mass lesion, hemorrhage or acute infarction. Vascular: No hyperdense vessel or unexpected calcification. Skull: Status post occipital craniotomy. No acute abnormality is noted. Sinuses/Orbits: No acute finding. Other: None. IMPRESSION: Mild chronic ischemic white matter disease. No acute intracranial abnormality seen. Electronically Signed   By: Marijo Conception, M.D.   On: 12/03/2016 16:54   Mr Brain Wo Contrast  Result Date: 12/03/2016 CLINICAL DATA:  Initial evaluation for other acute headache not well left-sided numbness, slurred speech. EXAM: MRI HEAD WITHOUT CONTRAST MRA HEAD WITHOUT  CONTRAST TECHNIQUE: Multiplanar, multiecho pulse sequences of the brain and surrounding structures were obtained without intravenous contrast. Angiographic images of the head were obtained using MRA technique without contrast. COMPARISON:  Prior CT from earlier the same day. FINDINGS: MRI HEAD FINDINGS Brain: Generalized age related cerebral atrophy. Patchy T2/FLAIR hyperintensity within the periventricular and deep white matter both cerebral hemispheres most consistent with chronic small vessel ischemic disease, mild nature. Chronic microvascular changes present within the pons as well. subcentimeter remote lacunar infarct present within the left internal capsule. No abnormal foci of restricted diffusion to suggest acute or subacute ischemia. Gray-white matter differentiation maintained. No other evidence for chronic infarction. No evidence for acute or chronic intracranial hemorrhage. No mass lesion, midline shift or mass effect. No hydrocephalus. No extra-axial fluid collection. Major dural sinuses are grossly patent. Incidental note made of a partially empty sella. Midline structures intact and normal. Vascular: Major intracranial vascular flow voids maintained. Skull and upper cervical spine: Craniocervical junction within normal limits. Upper cervical spine unremarkable. Bone marrow signal intensity within normal limits. No scalp soft tissue abnormality. Sinuses/Orbits: Globes normal soft tissues within normal limits. Scattered opacity within the ethmoidal air cells. Paranasal sinuses otherwise clear. Trace opacity noted within left mastoid air cells, of doubtful significance.  Inner ear structures normal. Other: None. MRA HEAD FINDINGS ANTERIOR CIRCULATION: Distal cervical segments of the internal carotid arteries are patent with antegrade flow. Petrous, cavernous, and supraclinoid segments patent without hemodynamically significant stenosis. ICA termini patent. A1 segments, anterior communicating artery  common anterior cerebral artery is patent to their distal aspects without flow-limiting stenosis. M1 segments patent without stenosis. Normal MCA bifurcations. No proximal M2 occlusion. Distal MCA branches well opacified and symmetric. Distal small vessel atheromatous irregularity noted within knee MCA branches bilaterally. POSTERIOR CIRCULATION: Left vertebral artery dominant and patent to the vertebrobasilar junction without stenosis. Right vertebral artery hypoplastic. Short-segment severe stenosis within the distal right V4 segment just prior to the vertebrobasilar junction (series 106, image 7). Posterior inferior cerebral arteries patent proximally. Basilar artery widely patent to its distal aspect. Superior cerebral arteries patent bilaterally. Both of the posterior cerebral arteries primarily supplied via the basilar and are patent to their distal aspects. Mild distal small vessel atheromatous irregularity. Small left posterior communicating artery noted. No aneurysm. IMPRESSION: MRI HEAD IMPRESSION: 1. No acute intracranial infarct or other process identified. 2. 6 mm remote lacunar infarct involving the left internal capsule. 3. Mild chronic microvascular ischemic disease. MRA HEAD IMPRESSION: 1. Negative MRA for large or proximal arterial branch occlusion. 2. Short-segment severe distal right V4 stenosis. Dominant left vertebral artery widely patent to the vertebrobasilar junction. No other high-grade or correctable stenosis within the intracranial circulation. 3. Distal small vessel atheromatous irregularity. Electronically Signed   By: Jeannine Boga M.D.   On: 12/03/2016 22:50   Mr Jodene Nam Head/brain ZW Cm  Result Date: 12/03/2016 CLINICAL DATA:  Initial evaluation for other acute headache not well left-sided numbness, slurred speech. EXAM: MRI HEAD WITHOUT CONTRAST MRA HEAD WITHOUT CONTRAST TECHNIQUE: Multiplanar, multiecho pulse sequences of the brain and surrounding structures were obtained  without intravenous contrast. Angiographic images of the head were obtained using MRA technique without contrast. COMPARISON:  Prior CT from earlier the same day. FINDINGS: MRI HEAD FINDINGS Brain: Generalized age related cerebral atrophy. Patchy T2/FLAIR hyperintensity within the periventricular and deep white matter both cerebral hemispheres most consistent with chronic small vessel ischemic disease, mild nature. Chronic microvascular changes present within the pons as well. subcentimeter remote lacunar infarct present within the left internal capsule. No abnormal foci of restricted diffusion to suggest acute or subacute ischemia. Gray-white matter differentiation maintained. No other evidence for chronic infarction. No evidence for acute or chronic intracranial hemorrhage. No mass lesion, midline shift or mass effect. No hydrocephalus. No extra-axial fluid collection. Major dural sinuses are grossly patent. Incidental note made of a partially empty sella. Midline structures intact and normal. Vascular: Major intracranial vascular flow voids maintained. Skull and upper cervical spine: Craniocervical junction within normal limits. Upper cervical spine unremarkable. Bone marrow signal intensity within normal limits. No scalp soft tissue abnormality. Sinuses/Orbits: Globes normal soft tissues within normal limits. Scattered opacity within the ethmoidal air cells. Paranasal sinuses otherwise clear. Trace opacity noted within left mastoid air cells, of doubtful significance. Inner ear structures normal. Other: None. MRA HEAD FINDINGS ANTERIOR CIRCULATION: Distal cervical segments of the internal carotid arteries are patent with antegrade flow. Petrous, cavernous, and supraclinoid segments patent without hemodynamically significant stenosis. ICA termini patent. A1 segments, anterior communicating artery common anterior cerebral artery is patent to their distal aspects without flow-limiting stenosis. M1 segments patent  without stenosis. Normal MCA bifurcations. No proximal M2 occlusion. Distal MCA branches well opacified and symmetric. Distal small vessel atheromatous irregularity noted within knee MCA branches  bilaterally. POSTERIOR CIRCULATION: Left vertebral artery dominant and patent to the vertebrobasilar junction without stenosis. Right vertebral artery hypoplastic. Short-segment severe stenosis within the distal right V4 segment just prior to the vertebrobasilar junction (series 106, image 7). Posterior inferior cerebral arteries patent proximally. Basilar artery widely patent to its distal aspect. Superior cerebral arteries patent bilaterally. Both of the posterior cerebral arteries primarily supplied via the basilar and are patent to their distal aspects. Mild distal small vessel atheromatous irregularity. Small left posterior communicating artery noted. No aneurysm. IMPRESSION: MRI HEAD IMPRESSION: 1. No acute intracranial infarct or other process identified. 2. 6 mm remote lacunar infarct involving the left internal capsule. 3. Mild chronic microvascular ischemic disease. MRA HEAD IMPRESSION: 1. Negative MRA for large or proximal arterial branch occlusion. 2. Short-segment severe distal right V4 stenosis. Dominant left vertebral artery widely patent to the vertebrobasilar junction. No other high-grade or correctable stenosis within the intracranial circulation. 3. Distal small vessel atheromatous irregularity. Electronically Signed   By: Jeannine Boga M.D.   On: 12/03/2016 22:50      Current Discharge Medication List    CONTINUE these medications which have NOT CHANGED   Details  aspirin 81 MG tablet Take 1 tablet by mouth daily.    clopidogrel (PLAVIX) 75 MG tablet TAKE 1 TABLET DAILY Qty: 90 tablet, Refills: 3    fexofenadine (ALLEGRA) 180 MG tablet Take 180 mg by mouth daily.    lisinopril-hydrochlorothiazide (PRINZIDE,ZESTORETIC) 20-12.5 MG tablet TAKE 1 TABLET DAILY Qty: 90 tablet, Refills:  3   Associated Diagnoses: Essential (primary) hypertension    meclizine (ANTIVERT) 12.5 MG tablet Take 1 tablet (12.5 mg total) by mouth 3 (three) times daily as needed for dizziness. Qty: 30 tablet, Refills: 0    metFORMIN (GLUCOPHAGE) 500 MG tablet TAKE 1 TABLET TWICE A DAY Qty: 180 tablet, Refills: 1    MULTIPLE VITAMIN PO Take by mouth.    sertraline (ZOLOFT) 50 MG tablet TAKE 1 TABLET DAILY Qty: 90 tablet, Refills: 3   Associated Diagnoses: Depression    simvastatin (ZOCOR) 80 MG tablet TAKE 1 TABLET AT BEDTIME Qty: 90 tablet, Refills: 3          Management plans discussed with the patient and she is in agreement. Stable for discharge   Patient should follow up with pcp  CODE STATUS:     Code Status Orders  (From admission, onward)        Start     Ordered   12/03/16 1828  Full code  Continuous     12/03/16 1830    Code Status History    Date Active Date Inactive Code Status Order ID Comments User Context   This patient has a current code status but no historical code status.    Advance Directive Documentation     Most Recent Value  Type of Advance Directive  Healthcare Power of Attorney  Pre-existing out of facility DNR order (yellow form or pink MOST form)  No data  "MOST" Form in Place?  No data      TOTAL TIME TAKING CARE OF THIS PATIENT: 37 minutes.    Note: This dictation was prepared with Dragon dictation along with smaller phrase technology. Any transcriptional errors that result from this process are unintentional.  Kariana Wiles M.D on 12/04/2016 at 12:27 PM  Between 7am to 6pm - Pager - [phone] After 6pm go to [email or web address] - password [email or web address] [facility]  Office  [phone]  CC: Primary care physician; Army Melia,  Jesse Sans, MD

## 2016-12-04 NOTE — Progress Notes (Signed)
OT Screen Note  Patient Details Name: Misty Villarreal MRN: [id number] DOB: May 10, 1950   Cancelled Treatment:    Reason Eval/Treat Not Completed: OT screened, no needs identified, will sign off. Order received, chart reviewed. Upon attempt, pt back to baseline functional independence, no skilled OT needs at this time. Will sign off.  Jeni Salles, MPH, MS, OTR/L ascom [phone] 12/04/16, 12:26 PM

## 2016-12-05 ENCOUNTER — Telehealth: Payer: Self-pay

## 2016-12-05 LAB — ECHOCARDIOGRAM COMPLETE
Height: 62 in
WEIGHTICAEL: 2902.4 [oz_av]

## 2016-12-05 NOTE — Telephone Encounter (Signed)
Called pt to f/u after discharge from [facility] on 12/04/16. Also wanted to confirm f/u appt scheduled with Dr. Army Melia on 12/11/16 @ 10:15am. LVM requesting returned call.

## 2016-12-06 ENCOUNTER — Telehealth: Payer: Self-pay

## 2016-12-06 NOTE — Telephone Encounter (Signed)
Called pt to f/u after discharge from [facility] on 12/04/16. Also wanted to confirm f/u appt scheduled with Dr. Army Melia on 12/11/16 @ 10:15am. LVM requesting returned call.

## 2016-12-11 ENCOUNTER — Ambulatory Visit: Payer: Medicare HMO | Admitting: Internal Medicine

## 2016-12-11 ENCOUNTER — Encounter: Payer: Self-pay | Admitting: Internal Medicine

## 2016-12-11 VITALS — BP 132/70 | HR 71 | Ht 62.0 in | Wt 183.0 lb

## 2016-12-11 DIAGNOSIS — E1122 Type 2 diabetes mellitus with diabetic chronic kidney disease: Secondary | ICD-10-CM | POA: Diagnosis not present

## 2016-12-11 DIAGNOSIS — N183 Chronic kidney disease, stage 3 unspecified: Secondary | ICD-10-CM

## 2016-12-11 DIAGNOSIS — E782 Mixed hyperlipidemia: Secondary | ICD-10-CM | POA: Diagnosis not present

## 2016-12-11 DIAGNOSIS — I10 Essential (primary) hypertension: Secondary | ICD-10-CM | POA: Diagnosis not present

## 2016-12-11 DIAGNOSIS — E1151 Type 2 diabetes mellitus with diabetic peripheral angiopathy without gangrene: Secondary | ICD-10-CM

## 2016-12-11 DIAGNOSIS — G459 Transient cerebral ischemic attack, unspecified: Secondary | ICD-10-CM

## 2016-12-11 DIAGNOSIS — M62838 Other muscle spasm: Secondary | ICD-10-CM | POA: Diagnosis not present

## 2016-12-11 MED ORDER — ATORVASTATIN CALCIUM 40 MG PO TABS: 40.0000 mg | ORAL_TABLET | Freq: Every day | ORAL | 1 refills | Status: DC

## 2016-12-11 NOTE — Progress Notes (Signed)
Date:  12/11/2016   Name:  Misty Villarreal   DOB:  Aug 07, 1950   MRN:  [id number]   Chief Complaint: Hospitalization Follow-up Diabetes  She presents for her follow-up diabetic visit. She has type 2 diabetes mellitus. Her disease course has been stable. Pertinent negatives for hypoglycemia include no dizziness, headaches or nervousness/anxiousness. Pertinent negatives for diabetes include no chest pain and no fatigue. Diabetic complications include a CVA, nephropathy and PVD. Risk factors for coronary artery disease include dyslipidemia and hypertension. Current diabetic treatment includes oral agent (monotherapy). She is compliant with treatment most of the time (metformin causing persistent diarrhea).  Neck Pain   This is a chronic problem. The problem occurs daily. The problem has been unchanged. The pain is associated with nothing. The pain is mild. Pertinent negatives include no chest pain, fever, headaches, numbness, syncope, tingling or trouble swallowing. She has tried ice, heat and acetaminophen for the symptoms. The treatment provided mild relief.  Xrays showed degenerative disease.  She would like to see massage therapy or physical therapy.  TIA - seen a week ago with blurred vision and numbness of left hand with slurred speech. Started several days beforehand with intermittent symptoms. Admitted to [facility] 12/03/16 to 12/04/16. Work up: MRI did not show evidence of CVA. Evidence of remote lacunar infarct on left internal capsule and mild small vessel disease. ECHO shows no source of thrombus. It does show stage I diastolic dysfunction. Carotid shows no hemodynamically significant stenosis. Her medications were continued unchanged - zocor, plavix and aspirin. Since discharge she has not had any further symptoms.  Lab Results  Component Value Date   HGBA1C 6.8 (H) 12/04/2016     Review of Systems  Constitutional: Negative for chills, fatigue and fever.  HENT: Negative for  trouble swallowing.   Eyes: Negative for visual disturbance.  Respiratory: Negative for cough, chest tightness, shortness of breath and wheezing.   Cardiovascular: Negative for chest pain, palpitations and syncope.  Gastrointestinal: Negative for abdominal pain.  Musculoskeletal: Positive for neck pain and neck stiffness. Negative for arthralgias and gait problem.  Neurological: Negative for dizziness, tingling, syncope, numbness and headaches.  Psychiatric/Behavioral: Negative for sleep disturbance. The patient is not nervous/anxious.     Patient Active Problem List   Diagnosis Date Noted  . Slurred speech 12/03/2016  . Neck pain on right side 08/05/2016  . Tinnitus of right ear 08/05/2016  . Type 2 diabetes mellitus with stage 3 chronic kidney disease, without long-term current use of insulin ([place]) 08/03/2015  . Anemia 06/24/2015  . Localized edema 06/21/2015  . Tachycardia, paroxysmal ([place]) 06/21/2015  . PAD (peripheral artery disease) ([place]) 02/02/2015  . Angiopathy, peripheral ([place]) 09/13/2014  . Hyperlipidemia 09/01/2014  . Clinical depression 09/01/2014  . Neuropathy 09/01/2014  . Phlebectasia 09/01/2014  . Abnormal glandular Papanicolaou smear of cervix 09/01/2014  . DM (diabetes mellitus), type 2 with peripheral vascular complications ([place]) 95/18/8416  . Essential (primary) hypertension 09/01/2014  . Spondylolisthesis at L4-L5 level 07/05/2014  . Arthritis, degenerative 01/31/2014    Prior to Admission medications   Medication Sig Start Date End Date Taking? Authorizing Provider  aspirin 81 MG tablet Take 1 tablet by mouth daily.   Yes [provider]  clopidogrel (PLAVIX) 75 MG tablet TAKE 1 TABLET DAILY 01/30/16  Yes Stegmayer, Janalyn Harder, PA-C  fexofenadine (ALLEGRA) 180 MG tablet Take 180 mg by mouth daily.   Yes [provider]  lisinopril-hydrochlorothiazide (PRINZIDE,ZESTORETIC) 20-12.5 MG tablet TAKE 1 TABLET DAILY 01/30/16  Yes Glean Hess, MD   meclizine (ANTIVERT) 12.5 MG tablet Take 1 tablet (12.5 mg total) by mouth 3 (three) times daily as needed for dizziness. 08/05/16  Yes Glean Hess, MD  metFORMIN (GLUCOPHAGE) 500 MG tablet TAKE 1 TABLET TWICE A DAY 05/15/16  Yes Glean Hess, MD  MULTIPLE VITAMIN PO Take by mouth.   Yes [provider]  sertraline (ZOLOFT) 50 MG tablet TAKE 1 TABLET DAILY 07/30/16  Yes Glean Hess, MD  simvastatin (ZOCOR) 80 MG tablet TAKE 1 TABLET AT BEDTIME 10/30/15  Yes Glean Hess, MD    Allergies  Allergen Reactions  . Clavulanic Acid Diarrhea  . Tetracycline     Other reaction(s): emesis  . Cefaclor Rash  . Cephalexin Rash  . Sulfa Antibiotics Rash    Other reaction(s): emesis    Past Surgical History:  Procedure Laterality Date  . ANGIOPLASTY / STENTING FEMORAL Left 2014, 2013  . BRAIN MENINGIOMA EXCISION  1988  . BREAST BIOPSY Right 1989   benign  . CARPAL TUNNEL RELEASE Left 1980  . CHOLECYSTECTOMY  1987  . KNEE ARTHROPLASTY Right 2009    Social History   Tobacco Use  . Smoking status: Former Research scientist (life sciences)  . Smokeless tobacco: Former Systems developer    Quit date: 12/21/2013  Substance Use Topics  . Alcohol use: Yes    Alcohol/week: 1.2 oz    Types: 2 Standard drinks or equivalent per week    Comment: occasional  . Drug use: No     Medication list has been reviewed and updated.  PHQ 2/9 Scores 12/11/2016 08/05/2016 08/03/2015 06/21/2015  PHQ - 2 Score 0 0 6 0  PHQ- 9 Score 0 - 15 -    Physical Exam  Constitutional: She is oriented to person, place, and time. She appears well-developed. No distress.  HENT:  Head: Normocephalic and atraumatic.  Neck: Normal range of motion.  Cardiovascular: Normal rate, regular rhythm and normal heart sounds.  Pulmonary/Chest: Effort normal and breath sounds normal. No respiratory distress. She has no wheezes.  Abdominal: Soft. Bowel sounds are normal.  Musculoskeletal: She exhibits no edema or tenderness.       Cervical  back: She exhibits decreased range of motion and spasm (right trapezius). She exhibits no swelling and no edema.  Neurological: She is alert and oriented to person, place, and time. She has normal strength and normal reflexes. No cranial nerve deficit or sensory deficit. Gait normal.  Skin: Skin is warm and dry. No rash noted.  Psychiatric: She has a normal mood and affect. Her behavior is normal. Thought content normal.  Nursing note and vitals reviewed.   BP 132/70   Pulse 71   Ht 5\' 2"  (1.575 m)   Wt 183 lb (83 kg)   LMP  (LMP Unknown)   SpO2 96%   BMI 33.47 kg/m   Assessment and Plan: 1. TIA (transient ischemic attack) Recommend continuing plavix and aspirin Change zocor to atorvastatin to achieve LDL < 70  2. DM (diabetes mellitus), type 2 with peripheral vascular complications (HCC) Controlled Not tolerating metformin - reduce dose to 250 mg bid If diarrhea continues, will need to change treatment  3. Essential (primary) hypertension controleed  4. Type 2 diabetes mellitus with stage 3 chronic kidney disease, without long-term current use of insulin (HCC) Stable renal function Lab Results  Component Value Date   CREATININE 1.16 (H) 12/03/2016   BUN 25 (H) 12/03/2016   NA 138 12/03/2016   K  4.4 12/03/2016   CL 104 12/03/2016   CO2 23 12/03/2016     5. Mixed hyperlipidemia Stop zocor - start lipitor - atorvastatin (LIPITOR) 40 MG tablet; Take 1 tablet (40 mg total) by mouth daily.  Dispense: 90 tablet; Refill: 1  6. Neck muscle spasm Continue heat, tylenol - Ambulatory referral to Physical Therapy   Meds ordered this encounter  Medications  . atorvastatin (LIPITOR) 40 MG tablet    Sig: Take 1 tablet (40 mg total) by mouth daily.    Dispense:  90 tablet    Refill:  1    Partially dictated using Editor, commissioning. Any errors are unintentional.  Halina Maidens, MD [facility]  12/11/2016

## 2016-12-23 ENCOUNTER — Ambulatory Visit: Payer: Medicare HMO | Admitting: Physical Therapy

## 2016-12-24 ENCOUNTER — Encounter: Payer: Self-pay | Admitting: Internal Medicine

## 2016-12-24 ENCOUNTER — Ambulatory Visit: Payer: Medicare HMO | Admitting: Internal Medicine

## 2016-12-24 VITALS — BP 112/62 | HR 104 | Temp 99.5°F | Ht 62.0 in | Wt 182.0 lb

## 2016-12-24 DIAGNOSIS — J111 Influenza due to unidentified influenza virus with other respiratory manifestations: Secondary | ICD-10-CM

## 2016-12-24 MED ORDER — OSELTAMIVIR PHOSPHATE 75 MG PO CAPS: 75.0000 mg | ORAL_CAPSULE | Freq: Two times a day (BID) | ORAL | 0 refills | Status: DC

## 2016-12-24 MED ORDER — AZITHROMYCIN 250 MG PO TABS: ORAL_TABLET | ORAL | 0 refills | Status: DC

## 2016-12-24 MED ORDER — GUAIFENESIN-CODEINE 100-10 MG/5ML PO SYRP: 5.0000 mL | ORAL_SOLUTION | Freq: Three times a day (TID) | ORAL | 0 refills | Status: DC | PRN

## 2016-12-24 NOTE — Patient Instructions (Signed)

## 2016-12-24 NOTE — Progress Notes (Signed)
Date:  12/24/2016   Name:  Misty Villarreal   DOB:  1950/04/12   MRN:  [id number]   Chief Complaint: Cough (Started Sunday - throat itchy, running nose, low grade fever, cough, aching all over body. Has alot of mucous since last night. Husband was diagnosed with flu and pnuemonia yesterday. Was told to see Dr Since she has same symptoms.  )  Influenza  This is a new problem. The current episode started in the past 7 days. The problem occurs constantly. The problem has been gradually worsening. Associated symptoms include congestion, coughing, a fever, headaches, myalgias and a sore throat. Pertinent negatives include no vomiting. She has tried acetaminophen and drinking for the symptoms. The treatment provided mild relief.     Review of Systems  Constitutional: Positive for fever.  HENT: Positive for congestion and sore throat.   Respiratory: Positive for cough.   Gastrointestinal: Negative for vomiting.  Musculoskeletal: Positive for myalgias.  Neurological: Positive for headaches.    Patient Active Problem List   Diagnosis Date Noted  . Slurred speech 12/03/2016  . Neck pain on right side 08/05/2016  . Tinnitus of right ear 08/05/2016  . Type 2 diabetes mellitus with stage 3 chronic kidney disease, without long-term current use of insulin ([place]) 08/03/2015  . Anemia 06/24/2015  . Localized edema 06/21/2015  . Tachycardia, paroxysmal ([place]) 06/21/2015  . PAD (peripheral artery disease) ([place]) 02/02/2015  . Angiopathy, peripheral ([place]) 09/13/2014  . Hyperlipidemia 09/01/2014  . Clinical depression 09/01/2014  . Neuropathy 09/01/2014  . Phlebectasia 09/01/2014  . Abnormal glandular Papanicolaou smear of cervix 09/01/2014  . DM (diabetes mellitus), type 2 with peripheral vascular complications ([place]) 69/67/8938  . Essential (primary) hypertension 09/01/2014  . Spondylolisthesis at L4-L5 level 07/05/2014  . Arthritis, degenerative 01/31/2014    Prior to Admission medications    Medication Sig Start Date End Date Taking? Authorizing Provider  aspirin 81 MG tablet Take 1 tablet by mouth daily.   Yes [provider]  atorvastatin (LIPITOR) 40 MG tablet Take 1 tablet (40 mg total) by mouth daily. 12/11/16  Yes Glean Hess, MD  clopidogrel (PLAVIX) 75 MG tablet TAKE 1 TABLET DAILY 01/30/16  Yes Stegmayer, Janalyn Harder, PA-C  fexofenadine (ALLEGRA) 180 MG tablet Take 180 mg by mouth daily.   Yes [provider]  lisinopril-hydrochlorothiazide (PRINZIDE,ZESTORETIC) 20-12.5 MG tablet TAKE 1 TABLET DAILY 01/30/16  Yes Glean Hess, MD  meclizine (ANTIVERT) 12.5 MG tablet Take 1 tablet (12.5 mg total) by mouth 3 (three) times daily as needed for dizziness. 08/05/16  Yes Glean Hess, MD  metFORMIN (GLUCOPHAGE) 500 MG tablet TAKE 1 TABLET TWICE A DAY 05/15/16  Yes Glean Hess, MD  MULTIPLE VITAMIN PO Take by mouth.   Yes [provider]  sertraline (ZOLOFT) 50 MG tablet TAKE 1 TABLET DAILY 07/30/16  Yes Glean Hess, MD    Allergies  Allergen Reactions  . Clavulanic Acid Diarrhea  . Tetracycline     Other reaction(s): emesis  . Cefaclor Rash  . Cephalexin Rash  . Sulfa Antibiotics Rash    Other reaction(s): emesis    Past Surgical History:  Procedure Laterality Date  . ANGIOPLASTY / STENTING FEMORAL Left 2014, 2013  . BRAIN MENINGIOMA EXCISION  1988  . BREAST BIOPSY Right 1989   benign  . CARPAL TUNNEL RELEASE Left 1980  . CHOLECYSTECTOMY  1987  . KNEE ARTHROPLASTY Right 2009    Social History   Tobacco Use  .  Smoking status: Former Research scientist (life sciences)  . Smokeless tobacco: Former Systems developer    Quit date: 12/21/2013  Substance Use Topics  . Alcohol use: Yes    Alcohol/week: 1.2 oz    Types: 2 Standard drinks or equivalent per week    Comment: occasional  . Drug use: No     Medication list has been reviewed and updated.  PHQ 2/9 Scores 12/11/2016 08/05/2016 08/03/2015 06/21/2015  PHQ - 2 Score 0 0 6 0  PHQ- 9 Score 0 - 15 -      Physical Exam  Constitutional: She is oriented to person, place, and time. She appears well-developed. No distress.  HENT:  Head: Normocephalic and atraumatic.  Right Ear: Tympanic membrane and ear canal normal.  Left Ear: Tympanic membrane and ear canal normal.  Nose: Right sinus exhibits maxillary sinus tenderness. Left sinus exhibits maxillary sinus tenderness.  Mouth/Throat: No posterior oropharyngeal edema or posterior oropharyngeal erythema.  Eyes: Pupils are equal, round, and reactive to light.  Neck: Normal range of motion. Neck supple.  Cardiovascular: Regular rhythm, normal heart sounds and normal pulses.  No extrasystoles are present. Tachycardia present.  Pulmonary/Chest: Effort normal and breath sounds normal. No respiratory distress. She has no wheezes. She has no rales.  Musculoskeletal: Normal range of motion.  Neurological: She is alert and oriented to person, place, and time.  Skin: Skin is warm and dry. No rash noted. She is not diaphoretic.  Psychiatric: She has a normal mood and affect. Her speech is normal and behavior is normal. Thought content normal.  Nursing note and vitals reviewed.   BP 112/62   Pulse (!) 104   Temp 99.5 F (37.5 C) (Oral)   Ht 5\' 2"  (1.575 m)   Wt 182 lb (82.6 kg)   LMP  (LMP Unknown)   SpO2 97%   BMI 33.29 kg/m   Assessment and Plan: 1. Influenza Continue fluids, tylenol and rest Must remain out of the hospital until fever has resolved - oseltamivir (TAMIFLU) 75 MG capsule; Take 1 capsule (75 mg total) by mouth 2 (two) times daily.  Dispense: 10 capsule; Refill: 0 - azithromycin (ZITHROMAX Z-PAK) 250 MG tablet; UAD  Dispense: 6 each; Refill: 0 - guaiFENesin-codeine (ROBITUSSIN AC) 100-10 MG/5ML syrup; Take 5 mLs by mouth 3 (three) times daily as needed for cough.  Dispense: 150 mL; Refill: 0   Meds ordered this encounter  Medications  . oseltamivir (TAMIFLU) 75 MG capsule    Sig: Take 1 capsule (75 mg total) by mouth 2  (two) times daily.    Dispense:  10 capsule    Refill:  0  . azithromycin (ZITHROMAX Z-PAK) 250 MG tablet    Sig: UAD    Dispense:  6 each    Refill:  0  . guaiFENesin-codeine (ROBITUSSIN AC) 100-10 MG/5ML syrup    Sig: Take 5 mLs by mouth 3 (three) times daily as needed for cough.    Dispense:  150 mL    Refill:  0    Partially dictated using Editor, commissioning. Any errors are unintentional.  Halina Maidens, MD [facility]  12/24/2016

## 2016-12-31 ENCOUNTER — Other Ambulatory Visit: Payer: Self-pay | Admitting: Internal Medicine

## 2016-12-31 DIAGNOSIS — J111 Influenza due to unidentified influenza virus with other respiratory manifestations: Secondary | ICD-10-CM

## 2016-12-31 MED ORDER — GUAIFENESIN-CODEINE 100-10 MG/5ML PO SYRP: 5.0000 mL | ORAL_SOLUTION | Freq: Three times a day (TID) | ORAL | 0 refills | Status: DC | PRN

## 2016-12-31 MED ORDER — AMOXICILLIN 875 MG PO TABS: 875.0000 mg | ORAL_TABLET | Freq: Two times a day (BID) | ORAL | 0 refills | Status: DC

## 2017-01-26 ENCOUNTER — Other Ambulatory Visit: Payer: Self-pay

## 2017-01-26 ENCOUNTER — Emergency Department: Payer: Medicare HMO

## 2017-01-26 DIAGNOSIS — Z79899 Other long term (current) drug therapy: Secondary | ICD-10-CM | POA: Insufficient documentation

## 2017-01-26 DIAGNOSIS — Y929 Unspecified place or not applicable: Secondary | ICD-10-CM | POA: Diagnosis not present

## 2017-01-26 DIAGNOSIS — Z87891 Personal history of nicotine dependence: Secondary | ICD-10-CM | POA: Diagnosis not present

## 2017-01-26 DIAGNOSIS — E119 Type 2 diabetes mellitus without complications: Secondary | ICD-10-CM | POA: Diagnosis not present

## 2017-01-26 DIAGNOSIS — Z7901 Long term (current) use of anticoagulants: Secondary | ICD-10-CM | POA: Insufficient documentation

## 2017-01-26 DIAGNOSIS — S6991XA Unspecified injury of right wrist, hand and finger(s), initial encounter: Secondary | ICD-10-CM | POA: Diagnosis not present

## 2017-01-26 DIAGNOSIS — M7989 Other specified soft tissue disorders: Secondary | ICD-10-CM | POA: Diagnosis not present

## 2017-01-26 DIAGNOSIS — M25531 Pain in right wrist: Secondary | ICD-10-CM | POA: Diagnosis not present

## 2017-01-26 DIAGNOSIS — S59901A Unspecified injury of right elbow, initial encounter: Secondary | ICD-10-CM | POA: Diagnosis not present

## 2017-01-26 DIAGNOSIS — Y93K1 Activity, walking an animal: Secondary | ICD-10-CM | POA: Diagnosis not present

## 2017-01-26 DIAGNOSIS — M79641 Pain in right hand: Secondary | ICD-10-CM | POA: Diagnosis not present

## 2017-01-26 DIAGNOSIS — W010XXA Fall on same level from slipping, tripping and stumbling without subsequent striking against object, initial encounter: Secondary | ICD-10-CM | POA: Diagnosis not present

## 2017-01-26 DIAGNOSIS — Z7982 Long term (current) use of aspirin: Secondary | ICD-10-CM | POA: Insufficient documentation

## 2017-01-26 DIAGNOSIS — I10 Essential (primary) hypertension: Secondary | ICD-10-CM | POA: Diagnosis not present

## 2017-01-26 DIAGNOSIS — S60031A Contusion of right middle finger without damage to nail, initial encounter: Secondary | ICD-10-CM | POA: Insufficient documentation

## 2017-01-26 DIAGNOSIS — M25521 Pain in right elbow: Secondary | ICD-10-CM | POA: Diagnosis not present

## 2017-01-26 DIAGNOSIS — Y999 Unspecified external cause status: Secondary | ICD-10-CM | POA: Diagnosis not present

## 2017-01-26 DIAGNOSIS — E1151 Type 2 diabetes mellitus with diabetic peripheral angiopathy without gangrene: Secondary | ICD-10-CM | POA: Diagnosis not present

## 2017-01-26 NOTE — ED Triage Notes (Signed)
Pt reports that approx an hour ago she tripped due to her dog's leash, pt has an injury to her rt wrist and rt 3rd finger with swelling and bruising, pt unable to get her ring off, pt also states that she face planted and hit her nose and tooth, pt denies loc

## 2017-01-27 ENCOUNTER — Other Ambulatory Visit (INDEPENDENT_AMBULATORY_CARE_PROVIDER_SITE_OTHER): Payer: Self-pay | Admitting: Vascular Surgery

## 2017-01-27 ENCOUNTER — Emergency Department
Admission: EM | Admit: 2017-01-27 | Discharge: 2017-01-27 | Disposition: A | Discharge status: Home / Self Care | Payer: Medicare HMO | Attending: Emergency Medicine | Admitting: Emergency Medicine

## 2017-01-27 ENCOUNTER — Other Ambulatory Visit: Payer: Self-pay | Admitting: Internal Medicine

## 2017-01-27 ENCOUNTER — Emergency Department: Payer: Medicare HMO

## 2017-01-27 DIAGNOSIS — E119 Type 2 diabetes mellitus without complications: Secondary | ICD-10-CM | POA: Diagnosis not present

## 2017-01-27 DIAGNOSIS — M25521 Pain in right elbow: Secondary | ICD-10-CM | POA: Diagnosis not present

## 2017-01-27 DIAGNOSIS — E1151 Type 2 diabetes mellitus with diabetic peripheral angiopathy without gangrene: Secondary | ICD-10-CM | POA: Diagnosis not present

## 2017-01-27 DIAGNOSIS — I10 Essential (primary) hypertension: Secondary | ICD-10-CM

## 2017-01-27 DIAGNOSIS — Z7901 Long term (current) use of anticoagulants: Secondary | ICD-10-CM | POA: Diagnosis not present

## 2017-01-27 DIAGNOSIS — W010XXA Fall on same level from slipping, tripping and stumbling without subsequent striking against object, initial encounter: Secondary | ICD-10-CM | POA: Diagnosis not present

## 2017-01-27 DIAGNOSIS — S59901A Unspecified injury of right elbow, initial encounter: Secondary | ICD-10-CM | POA: Diagnosis not present

## 2017-01-27 DIAGNOSIS — W19XXXA Unspecified fall, initial encounter: Secondary | ICD-10-CM

## 2017-01-27 DIAGNOSIS — Z87891 Personal history of nicotine dependence: Secondary | ICD-10-CM | POA: Diagnosis not present

## 2017-01-27 DIAGNOSIS — M25531 Pain in right wrist: Secondary | ICD-10-CM | POA: Diagnosis not present

## 2017-01-27 DIAGNOSIS — Z7982 Long term (current) use of aspirin: Secondary | ICD-10-CM | POA: Diagnosis not present

## 2017-01-27 DIAGNOSIS — Z79899 Other long term (current) drug therapy: Secondary | ICD-10-CM | POA: Diagnosis not present

## 2017-01-27 DIAGNOSIS — S60031A Contusion of right middle finger without damage to nail, initial encounter: Secondary | ICD-10-CM

## 2017-01-27 MED ORDER — ONDANSETRON 4 MG PO TBDP
4.0000 mg | ORAL_TABLET | Freq: Once | ORAL | Status: AC
  Administered 2017-01-27: 4 mg via ORAL
  Filled 2017-01-27: qty 1

## 2017-01-27 MED ORDER — IBUPROFEN 800 MG PO TABS: 800.0000 mg | ORAL_TABLET | Freq: Three times a day (TID) | ORAL | 0 refills | Status: DC | PRN

## 2017-01-27 MED ORDER — KETOROLAC TROMETHAMINE 30 MG/ML IJ SOLN
60.0000 mg | Freq: Once | INTRAMUSCULAR | Status: AC
  Administered 2017-01-27: 60 mg via INTRAMUSCULAR
  Filled 2017-01-27: qty 2

## 2017-01-27 MED ORDER — OXYCODONE-ACETAMINOPHEN 5-325 MG PO TABS: 1.0000 | ORAL_TABLET | ORAL | 0 refills | Status: DC | PRN

## 2017-01-27 MED ORDER — OXYCODONE-ACETAMINOPHEN 5-325 MG PO TABS
1.0000 | ORAL_TABLET | Freq: Once | ORAL | Status: AC
  Administered 2017-01-27: 1 via ORAL
  Filled 2017-01-27: qty 1

## 2017-01-27 NOTE — ED Notes (Signed)
Patient to stat desk asking about wait time. Patient given update on wait time. Patient in no acute distress at this time.

## 2017-01-27 NOTE — Discharge Instructions (Signed)
1.  You may take pain medicines as needed (Motrin/Percocet #15). 2.  Keep splint clean and dry.  Elevate affected area and apply ice over splint several times daily.  Wear sling as needed for comfort. 3.  Return to the ER for worsening symptoms, persistent vomiting, difficulty breathing or other concerns.

## 2017-01-27 NOTE — ED Provider Notes (Signed)
[facility] Emergency Department Provider Note   ____________________________________________   First MD Initiated Contact with Patient 01/27/17 0245     (approximate)  I have reviewed the triage vital signs and the nursing notes.   HISTORY  Chief Complaint Fall; Wrist Pain; and Finger Injury    HPI Misty Villarreal is a 67 y.o. female who presents to the ED from home with a chief complaint of right upper extremity injury.  Approximately 1 hour prior to arrival, patient tripped due to her dog's leash.  Fell on outstretched right arm.  Did strike her nose and front tooth.  Denies LOC.  Patient is left-hand dominant.  Complains of right middle finger, wrist, forearm and elbow pain.  Denies headache, vision changes, neck pain, chest pain, shortness of breath, abdominal pain, nausea, vomiting, hematuria.   Past medical history None  Patient Active Problem List   Diagnosis Date Noted  . Slurred speech 12/03/2016  . Neck pain on right side 08/05/2016  . Tinnitus of right ear 08/05/2016  . Type 2 diabetes mellitus with stage 3 chronic kidney disease, without long-term current use of insulin ([place]) 08/03/2015  . Anemia 06/24/2015  . Localized edema 06/21/2015  . Tachycardia, paroxysmal ([place]) 06/21/2015  . PAD (peripheral artery disease) ([place]) 02/02/2015  . Angiopathy, peripheral ([place]) 09/13/2014  . Hyperlipidemia 09/01/2014  . Clinical depression 09/01/2014  . Neuropathy 09/01/2014  . Phlebectasia 09/01/2014  . Abnormal glandular Papanicolaou smear of cervix 09/01/2014  . DM (diabetes mellitus), type 2 with peripheral vascular complications ([place]) 74/08/1446  . Essential (primary) hypertension 09/01/2014  . Spondylolisthesis at L4-L5 level 07/05/2014  . Arthritis, degenerative 01/31/2014    Past Surgical History:  Procedure Laterality Date  . ANGIOPLASTY / STENTING FEMORAL Left 2014, 2013  . BRAIN MENINGIOMA EXCISION  1988  . BREAST BIOPSY Right  1989   benign  . CARPAL TUNNEL RELEASE Left 1980  . CHOLECYSTECTOMY  1987  . KNEE ARTHROPLASTY Right 2009    Prior to Admission medications   Medication Sig Start Date End Date Taking? Authorizing Provider  amoxicillin (AMOXIL) 875 MG tablet Take 1 tablet (875 mg total) by mouth 2 (two) times daily. 12/31/16   Glean Hess, MD  aspirin 81 MG tablet Take 1 tablet by mouth daily.    [provider]  atorvastatin (LIPITOR) 40 MG tablet Take 1 tablet (40 mg total) by mouth daily. 12/11/16   Glean Hess, MD  clopidogrel (PLAVIX) 75 MG tablet TAKE 1 TABLET DAILY 01/30/16   Stegmayer, Janalyn Harder, PA-C  fexofenadine (ALLEGRA) 180 MG tablet Take 180 mg by mouth daily.    [provider]  guaiFENesin-codeine (ROBITUSSIN AC) 100-10 MG/5ML syrup Take 5 mLs by mouth 3 (three) times daily as needed for cough. 12/31/16   Glean Hess, MD  ibuprofen (ADVIL,MOTRIN) 800 MG tablet Take 1 tablet (800 mg total) by mouth every 8 (eight) hours as needed for moderate pain. 01/27/17   Paulette Blanch, MD  lisinopril-hydrochlorothiazide (PRINZIDE,ZESTORETIC) 20-12.5 MG tablet TAKE 1 TABLET DAILY 01/30/16   Glean Hess, MD  meclizine (ANTIVERT) 12.5 MG tablet Take 1 tablet (12.5 mg total) by mouth 3 (three) times daily as needed for dizziness. 08/05/16   Glean Hess, MD  metFORMIN (GLUCOPHAGE) 500 MG tablet TAKE 1 TABLET TWICE A DAY 05/15/16   Glean Hess, MD  MULTIPLE VITAMIN PO Take by mouth.    [provider]  oseltamivir (TAMIFLU) 75 MG capsule Take 1 capsule (  75 mg total) by mouth 2 (two) times daily. 12/24/16   Glean Hess, MD  oxyCODONE-acetaminophen (ROXICET) 5-325 MG tablet Take 1 tablet by mouth every 4 (four) hours as needed for severe pain. 01/27/17   Paulette Blanch, MD  sertraline (ZOLOFT) 50 MG tablet TAKE 1 TABLET DAILY 07/30/16   Glean Hess, MD    Allergies Clavulanic acid; Tetracycline; Cefaclor; Cephalexin; and Sulfa antibiotics  Family  History  Problem Relation Age of Onset  . Diabetes Mother   . Heart failure Father   . Diabetes Father     Social History Social History   Tobacco Use  . Smoking status: Former Research scientist (life sciences)  . Smokeless tobacco: Former Systems developer    Quit date: 12/21/2013  Substance Use Topics  . Alcohol use: Yes    Alcohol/week: 1.2 oz    Types: 2 Standard drinks or equivalent per week    Comment: occasional  . Drug use: No    Review of Systems  Constitutional: No fever/chills. Eyes: No visual changes. ENT: No sore throat. Cardiovascular: Denies chest pain. Respiratory: Denies shortness of breath. Gastrointestinal: No abdominal pain.  No nausea, no vomiting.  No diarrhea.  No constipation. Genitourinary: Negative for dysuria. Musculoskeletal: Positive for RUE injury.  Negative for back pain. Skin: Negative for rash. Neurological: Negative for headaches, focal weakness or numbness.   ____________________________________________   PHYSICAL EXAM:  VITAL SIGNS: ED Triage Vitals  Enc Vitals Group     BP 01/26/17 2232 (!) 119/94     Pulse Rate 01/26/17 2232 81     Resp 01/26/17 2232 18     Temp 01/26/17 2232 98.1 F (36.7 C)     Temp Source 01/26/17 2232 Oral     SpO2 01/26/17 2232 99 %     Weight 01/26/17 2233 170 lb (77.1 kg)     Height 01/26/17 2233 5\' 2"  (1.575 m)     Head Circumference --      Peak Flow --      Pain Score 01/26/17 2255 8     Pain Loc --      Pain Edu? --      Excl. in [place]? --     Constitutional: Alert and oriented. Well appearing and in no acute distress. Eyes: Conjunctivae are normal. PERRL. EOMI. Head: Atraumatic. Nose: No injury noted. Mouth/Throat: No dental malocclusion.  No loose teeth.  Mucous membranes are moist.   Neck: No stridor.  No cervical spine tenderness to palpation. Cardiovascular: Normal rate, regular rhythm. Grossly normal heart sounds.  Good peripheral circulation. Respiratory: Normal respiratory effort.  No retractions. Lungs  CTAB. Gastrointestinal: Soft and nontender. No distention. No abdominal bruits. No CVA tenderness. Musculoskeletal:  RUE: Middle finger with moderate swelling and ecchymosis.  Ring stuck on middle finger.  Tender to palpation at the scaphoid process, mid forearm and olecranon.  Limited range of motion secondary to pain. 2+ radial pulses.  Brisk, less than 5-second capillary refill. Neurologic:  Normal speech and language. No gross focal neurologic deficits are appreciated. No gait instability. Skin:  Skin is warm, dry and intact. No rash noted. Psychiatric: Mood and affect are normal. Speech and behavior are normal.  ____________________________________________   LABS (all labs ordered are listed, but only abnormal results are displayed)  Labs Reviewed - No data to display ____________________________________________  EKG  None ____________________________________________  RADIOLOGY  Dg Elbow Complete Right  Result Date: 01/27/2017 CLINICAL DATA:  Right elbow pain after fall tripping on dog leash. EXAM: RIGHT  ELBOW - COMPLETE 3+ VIEW COMPARISON:  None. FINDINGS: There is no evidence of fracture, dislocation, or joint effusion. There is no evidence of arthropathy or other focal bone abnormality. Soft tissues are unremarkable. IMPRESSION: Negative radiographs of the right elbow. Electronically Signed   By: Jeb Levering M.D.   On: 01/27/2017 03:26   Dg Wrist Complete Right  Result Date: 01/27/2017 CLINICAL DATA:  Right hand and wrist pain after injury. Fall after tripping on dog leash. EXAM: RIGHT WRIST - COMPLETE 3+ VIEW COMPARISON:  None. FINDINGS: There is no evidence of fracture or dislocation. Moderate to advanced osteoarthritis at the base of the thumb. Minimal radiocarpal joint space narrowing. Soft tissues are unremarkable. IMPRESSION: No fracture or subluxation of the right wrist. Electronically Signed   By: Jeb Levering M.D.   On: 01/27/2017 00:13   Dg Hand Complete  Right  Result Date: 01/27/2017 CLINICAL DATA:  Right hand and wrist pain after injury. Fall after tripping on dog leash. EXAM: RIGHT HAND - COMPLETE 3+ VIEW COMPARISON:  None. FINDINGS: There is no evidence of fracture or dislocation. Moderate to advanced osteoarthritis at the thumb carpal metacarpal joint. Soft tissue edema of third digit. IMPRESSION: 1. No fracture or acute osseous abnormality of the right hand. 2. Soft tissue edema of the third digit. 3. Moderate to advanced osteoarthritis at the base of the thumb. Electronically Signed   By: Jeb Levering M.D.   On: 01/27/2017 00:12    ____________________________________________   PROCEDURES  Procedure(s) performed:     .Splint Application Date/Time: 4/0/9811 6:32 AM Performed by: Alm Bustard, RN Authorized by: Paulette Blanch, MD   Consent:    Consent obtained:  Verbal   Consent given by:  Patient   Risks discussed:  Swelling, pain, numbness and discoloration Pre-procedure details:    Sensation:  Normal Procedure details:    Laterality:  Right   Location:  Wrist   Wrist:  R wrist   Strapping: no     Splint type:  Thumb spica   Supplies:  Ortho-Glass, sling and elastic bandage Post-procedure details:    Pain:  Improved   Sensation:  Normal   Skin color:  Pink   Patient tolerance of procedure:  Tolerated well, no immediate complications    Critical Care performed: No  ____________________________________________   INITIAL IMPRESSION / ASSESSMENT AND PLAN / ED COURSE  As part of my medical decision making, I reviewed the following data within the [place] History obtained from family, Nursing notes reviewed and incorporated, Radiograph reviewed and Notes from prior ED visits.   67 year old female who presents with right upper extremity injury secondary to mechanical fall.  Patient had x-rays of right wrist and hand in triage which are unremarkable for fracture or dislocation.  Soft tissue  edema noted to third digit.  Will add x-rays of forearm and elbow.  Nursing to cut her ring off of the third digit.  Clinical Course as of Jan 28 631  Mon Jan 27, 2017  0424 Updated patient and her daughter of negative x-ray.  Reexamined right middle finger after ring was removed.  Swelling already improved.  Patient is able to wiggle fingers freely.  Brisk, less than 5-second capillary refill.  Will immobilize wrist with thumb spica splint, sling and discharged home with prescriptions for analgesia.  She will follow-up with orthopedics this week.  Strict return precautions given.  Both verbalize understanding and agree with plan of care.  [JS]  Clinical Course User Index [JS] Paulette Blanch, MD     ____________________________________________   FINAL CLINICAL IMPRESSION(S) / ED DIAGNOSES  Final diagnoses:  Fall, initial encounter  Right wrist pain  Contusion of right middle finger without damage to nail, initial encounter     ED Discharge Orders        Ordered    oxyCODONE-acetaminophen (ROXICET) 5-325 MG tablet  Every 4 hours PRN     01/27/17 0427    ibuprofen (ADVIL,MOTRIN) 800 MG tablet  Every 8 hours PRN     01/27/17 0427       Note:  This document was prepared using Dragon voice recognition software and may include unintentional dictation errors.    Paulette Blanch, MD 01/27/17 [phone]

## 2017-01-30 ENCOUNTER — Ambulatory Visit (INDEPENDENT_AMBULATORY_CARE_PROVIDER_SITE_OTHER): Payer: Medicare HMO | Admitting: Vascular Surgery

## 2017-01-30 ENCOUNTER — Ambulatory Visit (INDEPENDENT_AMBULATORY_CARE_PROVIDER_SITE_OTHER): Payer: Medicare HMO

## 2017-01-30 ENCOUNTER — Encounter (INDEPENDENT_AMBULATORY_CARE_PROVIDER_SITE_OTHER): Payer: Self-pay | Admitting: Vascular Surgery

## 2017-01-30 VITALS — BP 150/79 | HR 78 | Resp 17 | Wt 182.0 lb

## 2017-01-30 DIAGNOSIS — I10 Essential (primary) hypertension: Secondary | ICD-10-CM

## 2017-01-30 DIAGNOSIS — I739 Peripheral vascular disease, unspecified: Secondary | ICD-10-CM

## 2017-01-30 DIAGNOSIS — E1122 Type 2 diabetes mellitus with diabetic chronic kidney disease: Secondary | ICD-10-CM

## 2017-01-30 DIAGNOSIS — N183 Chronic kidney disease, stage 3 unspecified: Secondary | ICD-10-CM

## 2017-01-30 DIAGNOSIS — E1151 Type 2 diabetes mellitus with diabetic peripheral angiopathy without gangrene: Secondary | ICD-10-CM

## 2017-02-01 ENCOUNTER — Encounter (INDEPENDENT_AMBULATORY_CARE_PROVIDER_SITE_OTHER): Payer: Self-pay | Admitting: Vascular Surgery

## 2017-02-01 NOTE — Progress Notes (Signed)
MRN : [id number]  Misty Villarreal is a 67 y.o. (Aug 25, 1950) female who presents with chief complaint of  Chief Complaint  Patient presents with  . Follow-up    16yr abi follow up  .  History of Present Illness: The patient returns to the office for followup and review of the noninvasive studies. There have been no interval changes in lower extremity symptoms. No interval shortening of the patient's claudication distance or development of rest pain symptoms. No new ulcers or wounds have occurred since the last visit.  There have been no significant changes to the patient's overall health care.  The patient denies amaurosis fugax or recent TIA symptoms. There are no recent neurological changes noted. The patient denies history of DVT, PE or superficial thrombophlebitis. The patient denies recent episodes of angina or shortness of breath.   ABI Rt=1.22 and Lt=0.95   Current Meds  Medication Sig  . aspirin 81 MG tablet Take 1 tablet by mouth daily.  Marland Kitchen atorvastatin (LIPITOR) 40 MG tablet Take 1 tablet (40 mg total) by mouth daily.  . clopidogrel (PLAVIX) 75 MG tablet TAKE 1 TABLET DAILY  . fexofenadine (ALLEGRA) 180 MG tablet Take 180 mg by mouth daily.  Marland Kitchen ibuprofen (ADVIL,MOTRIN) 800 MG tablet Take 1 tablet (800 mg total) by mouth every 8 (eight) hours as needed for moderate pain.  Marland Kitchen lisinopril-hydrochlorothiazide (PRINZIDE,ZESTORETIC) 20-12.5 MG tablet TAKE 1 TABLET DAILY  . meclizine (ANTIVERT) 12.5 MG tablet Take 1 tablet (12.5 mg total) by mouth 3 (three) times daily as needed for dizziness.  . metFORMIN (GLUCOPHAGE) 500 MG tablet TAKE 1 TABLET TWICE A DAY  . MULTIPLE VITAMIN PO Take by mouth.  . oxyCODONE-acetaminophen (ROXICET) 5-325 MG tablet Take 1 tablet by mouth every 4 (four) hours as needed for severe pain.  Marland Kitchen sertraline (ZOLOFT) 50 MG tablet TAKE 1 TABLET DAILY    Past Medical History:  Diagnosis Date  . Chronic kidney disease   . Diabetes mellitus without  complication ([place])   . Hyperlipidemia     Past Surgical History:  Procedure Laterality Date  . ANGIOPLASTY / STENTING FEMORAL Left 2014, 2013  . BRAIN MENINGIOMA EXCISION  1988  . BREAST BIOPSY Right 1989   benign  . CARPAL TUNNEL RELEASE Left 1980  . CHOLECYSTECTOMY  1987  . KNEE ARTHROPLASTY Right 2009    Social History Social History   Tobacco Use  . Smoking status: Former Research scientist (life sciences)  . Smokeless tobacco: Former Systems developer    Quit date: 12/21/2013  Substance Use Topics  . Alcohol use: Yes    Alcohol/week: 1.2 oz    Types: 2 Standard drinks or equivalent per week    Comment: occasional  . Drug use: No    Family History Family History  Problem Relation Age of Onset  . Diabetes Mother   . Heart failure Father   . Diabetes Father     Allergies  Allergen Reactions  . Clavulanic Acid Diarrhea  . Tetracycline     Other reaction(s): emesis  . Cefaclor Rash  . Cephalexin Rash  . Sulfa Antibiotics Rash    Other reaction(s): emesis     REVIEW OF SYSTEMS (Negative unless checked)  Constitutional: [] Weight loss  [] Fever  [] Chills Cardiac: [] Chest pain   [] Chest pressure   [] Palpitations   [] Shortness of breath when laying flat   [] Shortness of breath with exertion. Vascular:  [x] Pain in legs with walking   [] Pain in legs at rest  [] History of DVT   []   Phlebitis   [] Swelling in legs   [] Varicose veins   [] Non-healing ulcers Pulmonary:   [] Uses home oxygen   [] Productive cough   [] Hemoptysis   [] Wheeze  [] COPD   [] Asthma Neurologic:  [] Dizziness   [] Seizures   [] History of stroke   [] History of TIA  [] Aphasia   [] Vissual changes   [] Weakness or numbness in arm   [] Weakness or numbness in leg Musculoskeletal:   [] Joint swelling   [] Joint pain   [] Low back pain Hematologic:  [] Easy bruising  [] Easy bleeding   [] Hypercoagulable state   [] Anemic Gastrointestinal:  [] Diarrhea   [] Vomiting  [] Gastroesophageal reflux/heartburn   [] Difficulty swallowing. Genitourinary:  [] Chronic kidney  disease   [] Difficult urination  [] Frequent urination   [] Blood in urine Skin:  [] Rashes   [] Ulcers  Psychological:  [] History of anxiety   []  History of major depression.  Physical Examination  Vitals:   01/30/17 1347  BP: (!) 150/79  Pulse: 78  Resp: 17  Weight: 82.6 kg (182 lb)   Body mass index is 33.29 kg/m. Gen: WD/WN, NAD Head: [place]/AT, No temporalis wasting.  Ear/Nose/Throat: Hearing grossly intact, nares w/o erythema or drainage Eyes: PER, EOMI, sclera nonicteric.  Neck: Supple, no large masses.   Pulmonary:  Good air movement, no audible wheezing bilaterally, no use of accessory muscles.  Cardiac: RRR, no JVD Vascular:  Vessel Right Left  Radial Palpable Palpable  PT Trace Palpable Trace Palpable  DP Trace Palpable Trace Palpable  Gastrointestinal: Non-distended. No guarding/no peritoneal signs.  Musculoskeletal: M/S 5/5 throughout.  No deformity or atrophy.  Neurologic: CN 2-12 intact. Symmetrical.  Speech is fluent. Motor exam as listed above. Psychiatric: Judgment intact, Mood & affect appropriate for pt's clinical situation. Dermatologic: No rashes or ulcers noted.  No changes consistent with cellulitis. Lymph : No lichenification or skin changes of chronic lymphedema.  CBC Lab Results  Component Value Date   WBC 7.3 12/03/2016   HGB 12.1 12/03/2016   HCT 35.3 12/03/2016   MCV 88.1 12/03/2016   PLT 287 12/03/2016    BMET    Component Value Date/Time   NA 138 12/03/2016 1625   NA 140 08/05/2016 0000   NA 139 04/27/2013 0713   K 4.4 12/03/2016 1625   K 4.2 04/27/2013 0713   CL 104 12/03/2016 1625   CL 108 (H) 04/27/2013 0713   CO2 23 12/03/2016 1625   CO2 25 04/27/2013 0713   GLUCOSE 146 (H) 12/03/2016 1625   GLUCOSE 137 (H) 04/27/2013 0713   BUN 25 (H) 12/03/2016 1625   BUN 21 08/05/2016 0000   BUN 21 (H) 04/27/2013 0713   CREATININE 1.16 (H) 12/03/2016 1625   CREATININE 1.28 04/27/2013 0713   CALCIUM 9.6 12/03/2016 1625   CALCIUM 9.3  04/27/2013 0713   GFRNONAA 48 (L) 12/03/2016 1625   GFRNONAA 44 (L) 04/27/2013 0713   GFRAA 55 (L) 12/03/2016 1625   GFRAA 52 (L) 04/27/2013 0713   CrCl cannot be calculated (Patient's most recent lab result is older than the maximum 21 days allowed.).  COAG Lab Results  Component Value Date   INR 0.85 12/03/2016    Radiology Dg Elbow Complete Right  Result Date: 01/27/2017 CLINICAL DATA:  Right elbow pain after fall tripping on dog leash. EXAM: RIGHT ELBOW - COMPLETE 3+ VIEW COMPARISON:  None. FINDINGS: There is no evidence of fracture, dislocation, or joint effusion. There is no evidence of arthropathy or other focal bone abnormality. Soft tissues are unremarkable. IMPRESSION: Negative radiographs of  the right elbow. Electronically Signed   By: Jeb Levering M.D.   On: 01/27/2017 03:26   Dg Wrist Complete Right  Result Date: 01/27/2017 CLINICAL DATA:  Right hand and wrist pain after injury. Fall after tripping on dog leash. EXAM: RIGHT WRIST - COMPLETE 3+ VIEW COMPARISON:  None. FINDINGS: There is no evidence of fracture or dislocation. Moderate to advanced osteoarthritis at the base of the thumb. Minimal radiocarpal joint space narrowing. Soft tissues are unremarkable. IMPRESSION: No fracture or subluxation of the right wrist. Electronically Signed   By: Jeb Levering M.D.   On: 01/27/2017 00:13   Dg Hand Complete Right  Result Date: 01/27/2017 CLINICAL DATA:  Right hand and wrist pain after injury. Fall after tripping on dog leash. EXAM: RIGHT HAND - COMPLETE 3+ VIEW COMPARISON:  None. FINDINGS: There is no evidence of fracture or dislocation. Moderate to advanced osteoarthritis at the thumb carpal metacarpal joint. Soft tissue edema of third digit. IMPRESSION: 1. No fracture or acute osseous abnormality of the right hand. 2. Soft tissue edema of the third digit. 3. Moderate to advanced osteoarthritis at the base of the thumb. Electronically Signed   By: Jeb Levering M.D.   On:  01/27/2017 00:12    Assessment/Plan 1. PAD (peripheral artery disease) (HCC)  Recommend:  The patient has evidence of atherosclerosis of the lower extremities with claudication.  The patient does not voice lifestyle limiting changes at this point in time.  Noninvasive studies do not suggest clinically significant change.  No invasive studies, angiography or surgery at this time The patient should continue walking and begin a more formal exercise program.  The patient should continue antiplatelet therapy and aggressive treatment of the lipid abnormalities  No changes in the patient's medications at this time  The patient should continue wearing graduated compression socks 10-15 mmHg strength to control the mild edema.   - VAS [place] ABI WITH/WO TBI; Future  2. Essential (primary) hypertension Continue antihypertensive medications as already ordered, these medications have been reviewed and there are no changes at this time.   3. DM (diabetes mellitus), type 2 with peripheral vascular complications (HCC) Continue hypoglycemic medications as already ordered, these medications have been reviewed and there are no changes at this time.  Hgb A1C to be monitored as already arranged by primary service   4. Type 2 diabetes mellitus with stage 3 chronic kidney disease, without long-term current use of insulin (HCC) Continue hypoglycemic medications as already ordered, these medications have been reviewed and there are no changes at this time.  Hgb A1C to be monitored as already arranged by primary service     Hortencia Pilar, MD  02/01/2017 10:41 AM

## 2017-02-04 DIAGNOSIS — E1151 Type 2 diabetes mellitus with diabetic peripheral angiopathy without gangrene: Secondary | ICD-10-CM | POA: Diagnosis not present

## 2017-02-04 DIAGNOSIS — S63501A Unspecified sprain of right wrist, initial encounter: Secondary | ICD-10-CM | POA: Diagnosis not present

## 2017-02-04 DIAGNOSIS — M1811 Unilateral primary osteoarthritis of first carpometacarpal joint, right hand: Secondary | ICD-10-CM | POA: Diagnosis not present

## 2017-02-04 DIAGNOSIS — E669 Obesity, unspecified: Secondary | ICD-10-CM | POA: Diagnosis not present

## 2017-02-08 DIAGNOSIS — E119 Type 2 diabetes mellitus without complications: Secondary | ICD-10-CM | POA: Diagnosis not present

## 2017-03-13 ENCOUNTER — Encounter: Payer: Self-pay | Admitting: Internal Medicine

## 2017-03-13 ENCOUNTER — Ambulatory Visit: Payer: Medicare HMO | Admitting: Internal Medicine

## 2017-03-13 VITALS — BP 138/74 | HR 78 | Ht 62.0 in | Wt 181.0 lb

## 2017-03-13 DIAGNOSIS — E1151 Type 2 diabetes mellitus with diabetic peripheral angiopathy without gangrene: Secondary | ICD-10-CM

## 2017-03-13 DIAGNOSIS — Z8679 Personal history of other diseases of the circulatory system: Secondary | ICD-10-CM | POA: Diagnosis not present

## 2017-03-13 DIAGNOSIS — E1122 Type 2 diabetes mellitus with diabetic chronic kidney disease: Secondary | ICD-10-CM | POA: Diagnosis not present

## 2017-03-13 DIAGNOSIS — N183 Chronic kidney disease, stage 3 unspecified: Secondary | ICD-10-CM

## 2017-03-13 DIAGNOSIS — I739 Peripheral vascular disease, unspecified: Secondary | ICD-10-CM

## 2017-03-13 DIAGNOSIS — Z1211 Encounter for screening for malignant neoplasm of colon: Secondary | ICD-10-CM

## 2017-03-13 DIAGNOSIS — I10 Essential (primary) hypertension: Secondary | ICD-10-CM | POA: Diagnosis not present

## 2017-03-13 NOTE — Progress Notes (Signed)
Date:  03/13/2017   Name:  Misty Villarreal   DOB:  01-Feb-1950   MRN:  [id number]   Chief Complaint: Diabetes; Hypertension; and Hyperlipidemia Hypertension  This is a chronic problem. The problem is controlled. Pertinent negatives include no chest pain, headaches, palpitations or shortness of breath. Past treatments include ACE inhibitors and diuretics. There are no compliance problems.  Hypertensive end-organ damage includes kidney disease and PVD.  Diabetes  She presents for her follow-up diabetic visit. She has type 2 diabetes mellitus. Her disease course has been stable. Pertinent negatives for hypoglycemia include no headaches or tremors. Pertinent negatives for diabetes include no chest pain, no fatigue, no polydipsia and no polyuria. Diabetic complications include PVD. Current diabetic treatment includes oral agent (monotherapy) (metformin). She is compliant with treatment all of the time.  Hyperlipidemia  This is a chronic problem. Pertinent negatives include no chest pain or shortness of breath. Current antihyperlipidemic treatment includes statins.  PVD - seen recently by [facility].  ABIs were stable and they recommended continued exercise, Plavix and lipid control.  She is walking some and water aerobics twice a week. Colon cancer screening - she had a colonoscopy in 2002 which was normal.  She struggles to get someone to take her and asks about cologuard.  She denies problems.  No family hx of colon cancer.   Review of Systems  Constitutional: Negative for appetite change, fatigue, fever and unexpected weight change.  HENT: Negative for tinnitus and trouble swallowing.   Eyes: Negative for visual disturbance.  Respiratory: Negative for cough, chest tightness and shortness of breath.   Cardiovascular: Negative for chest pain, palpitations and leg swelling.  Gastrointestinal: Negative for abdominal pain, blood in stool, constipation and diarrhea.  Endocrine: Negative for polydipsia  and polyuria.  Genitourinary: Negative for dysuria and hematuria.  Musculoskeletal: Negative for arthralgias.  Skin: Negative for color change and rash.  Neurological: Negative for tremors, numbness and headaches.  Psychiatric/Behavioral: Negative for dysphoric mood and sleep disturbance.    Patient Active Problem List   Diagnosis Date Noted  . Slurred speech 12/03/2016  . Neck pain on right side 08/05/2016  . Tinnitus of right ear 08/05/2016  . Type 2 diabetes mellitus with stage 3 chronic kidney disease, without long-term current use of insulin ([place]) 08/03/2015  . Anemia 06/24/2015  . Localized edema 06/21/2015  . Tachycardia, paroxysmal ([place]) 06/21/2015  . PAD (peripheral artery disease) ([place]) 02/02/2015  . Angiopathy, peripheral ([place]) 09/13/2014  . Hyperlipidemia 09/01/2014  . Clinical depression 09/01/2014  . Neuropathy 09/01/2014  . Phlebectasia 09/01/2014  . Abnormal glandular Papanicolaou smear of cervix 09/01/2014  . DM (diabetes mellitus), type 2 with peripheral vascular complications ([place]) 29/52/8413  . Essential (primary) hypertension 09/01/2014  . Spondylolisthesis at L4-L5 level 07/05/2014  . Arthritis, degenerative 01/31/2014    Prior to Admission medications   Medication Sig Start Date End Date Taking? Authorizing Provider         aspirin 81 MG tablet Take 1 tablet by mouth daily.    [provider]  atorvastatin (LIPITOR) 40 MG tablet Take 1 tablet (40 mg total) by mouth daily. 12/11/16   Glean Hess, MD  clopidogrel (PLAVIX) 75 MG tablet TAKE 1 TABLET DAILY 01/27/17   Stegmayer, Janalyn Harder, PA-C  fexofenadine (ALLEGRA) 180 MG tablet Take 180 mg by mouth daily.    [provider]         ibuprofen (ADVIL,MOTRIN) 800 MG tablet Take 1 tablet (800 mg total) by  mouth every 8 (eight) hours as needed for moderate pain. 01/27/17   Paulette Blanch, MD  lisinopril-hydrochlorothiazide (PRINZIDE,ZESTORETIC) 20-12.5 MG tablet TAKE 1 TABLET DAILY 01/27/17    Glean Hess, MD  meclizine (ANTIVERT) 12.5 MG tablet Take 1 tablet (12.5 mg total) by mouth 3 (three) times daily as needed for dizziness. 08/05/16   Glean Hess, MD  metFORMIN (GLUCOPHAGE) 500 MG tablet TAKE 1 TABLET TWICE A DAY 05/15/16   Glean Hess, MD  MULTIPLE VITAMIN PO Take by mouth.    [provider]         oxyCODONE-acetaminophen (ROXICET) 5-325 MG tablet Take 1 tablet by mouth every 4 (four) hours as needed for severe pain. 01/27/17   Paulette Blanch, MD  sertraline (ZOLOFT) 50 MG tablet TAKE 1 TABLET DAILY 07/30/16   Glean Hess, MD    Allergies  Allergen Reactions  . Clavulanic Acid Diarrhea  . Tetracycline     Other reaction(s): emesis  . Cefaclor Rash  . Cephalexin Rash  . Sulfa Antibiotics Rash    Other reaction(s): emesis    Past Surgical History:  Procedure Laterality Date  . ANGIOPLASTY / STENTING FEMORAL Left 2014, 2013  . BRAIN MENINGIOMA EXCISION  1988  . BREAST BIOPSY Right 1989   benign  . CARPAL TUNNEL RELEASE Left 1980  . CHOLECYSTECTOMY  1987  . KNEE ARTHROPLASTY Right 2009    Social History   Tobacco Use  . Smoking status: Former Research scientist (life sciences)  . Smokeless tobacco: Former Systems developer    Quit date: 12/21/2013  Substance Use Topics  . Alcohol use: Yes    Alcohol/week: 1.2 oz    Types: 2 Standard drinks or equivalent per week    Comment: occasional  . Drug use: No     Medication list has been reviewed and updated.  PHQ 2/9 Scores 03/13/2017 12/11/2016 08/05/2016 08/03/2015  PHQ - 2 Score 0 0 0 6  PHQ- 9 Score 0 0 - 15    Physical Exam  Constitutional: She is oriented to person, place, and time. She appears well-developed and well-nourished. No distress.  HENT:  Head: Normocephalic and atraumatic.  Right Ear: Tympanic membrane and ear canal normal.  Left Ear: Tympanic membrane and ear canal normal.  Nose: Right sinus exhibits no maxillary sinus tenderness. Left sinus exhibits no maxillary sinus tenderness.  Mouth/Throat:  Uvula is midline and oropharynx is clear and moist.  Eyes: Conjunctivae and EOM are normal. Right eye exhibits no discharge. Left eye exhibits no discharge. No scleral icterus.  Neck: Normal range of motion. Carotid bruit is not present. No erythema present. No thyromegaly present.  Cardiovascular: Normal rate, regular rhythm, normal heart sounds and normal pulses.  Pulmonary/Chest: Effort normal. No respiratory distress. She has no wheezes.  Abdominal: Soft. Bowel sounds are normal. There is no hepatosplenomegaly. There is no tenderness. There is no CVA tenderness.  Musculoskeletal: Normal range of motion.  Lymphadenopathy:    She has no cervical adenopathy.    She has no axillary adenopathy.  Neurological: She is alert and oriented to person, place, and time. She has normal reflexes. No cranial nerve deficit or sensory deficit.  Skin: Skin is warm, dry and intact. No rash noted.  Psychiatric: She has a normal mood and affect. Her speech is normal and behavior is normal. Thought content normal.  Nursing note and vitals reviewed.   BP 138/74   Pulse 78   Ht 5\' 2"  (1.575 m)   Wt 181 lb (82.1  kg)   LMP  (LMP Unknown)   SpO2 98%   BMI 33.11 kg/m   Assessment and Plan: 1. DM (diabetes mellitus), type 2 with peripheral vascular complications (HCC) Stable per recent VS consultation - Hemoglobin A1c  2. Type 2 diabetes mellitus with stage 3 chronic kidney disease, without long-term current use of insulin (HCC) Continue medications, avoid nsaids - Comprehensive metabolic panel  3. Essential (primary) hypertension controlled  4. PAD (peripheral artery disease) (HCC) Check lipids and adjust meds if necessary - Lipid panel  5. Colon cancer screening - Cologuard  6. Tachycardia, paroxysmal (HCC) Continue diltiazem; no recent episodes    No orders of the defined types were placed in this encounter.   Partially dictated using Editor, commissioning. Any errors are  unintentional.  Halina Maidens, MD [facility]  03/13/2017

## 2017-03-13 NOTE — Patient Instructions (Addendum)
Schedule Diabetic eye exam  Schedule Mammogram  Call insurance for coverage of Cologuard

## 2017-03-14 LAB — COMPREHENSIVE METABOLIC PANEL
A/G RATIO: 1.5 (ref 1.2–2.2)
ALT: 7 IU/L (ref 0–32)
AST: 11 IU/L (ref 0–40)
Albumin: 4.4 g/dL (ref 3.6–4.8)
Alkaline Phosphatase: 97 IU/L (ref 39–117)
BUN/Creatinine Ratio: 13 (ref 12–28)
BUN: 14 mg/dL (ref 8–27)
Bilirubin Total: 0.6 mg/dL (ref 0.0–1.2)
CALCIUM: 9.3 mg/dL (ref 8.7–10.3)
CHLORIDE: 102 mmol/L (ref 96–106)
CO2: 20 mmol/L (ref 20–29)
Creatinine, Ser: 1.07 mg/dL — ABNORMAL HIGH (ref 0.57–1.00)
GFR calc Af Amer: 62 mL/min/{1.73_m2} (ref >59)
GFR, EST NON AFRICAN AMERICAN: 54 mL/min/{1.73_m2} — AB (ref >59)
GLUCOSE: 136 mg/dL — AB (ref 65–99)
Globulin, Total: 3 g/dL (ref 1.5–4.5)
POTASSIUM: 4.6 mmol/L (ref 3.5–5.2)
Sodium: 141 mmol/L (ref 134–144)
TOTAL PROTEIN: 7.4 g/dL (ref 6.0–8.5)

## 2017-03-14 LAB — LIPID PANEL
CHOLESTEROL TOTAL: 184 mg/dL (ref 100–199)
Chol/HDL Ratio: 3.5 ratio (ref 0.0–4.4)
HDL: 53 mg/dL (ref >39)
LDL Calculated: 97 mg/dL (ref 0–99)
TRIGLYCERIDES: 168 mg/dL — AB (ref 0–149)
VLDL CHOLESTEROL CAL: 34 mg/dL (ref 5–40)

## 2017-03-14 LAB — HEMOGLOBIN A1C
ESTIMATED AVERAGE GLUCOSE: 151 mg/dL
Hgb A1c MFr Bld: 6.9 % — ABNORMAL HIGH (ref 4.8–5.6)

## 2017-03-23 DIAGNOSIS — Z1212 Encounter for screening for malignant neoplasm of rectum: Secondary | ICD-10-CM | POA: Diagnosis not present

## 2017-03-28 ENCOUNTER — Other Ambulatory Visit: Payer: Self-pay | Admitting: Internal Medicine

## 2017-03-31 LAB — COLOGUARD

## 2017-04-09 ENCOUNTER — Other Ambulatory Visit: Payer: Self-pay | Admitting: Internal Medicine

## 2017-04-09 DIAGNOSIS — R195 Other fecal abnormalities: Secondary | ICD-10-CM

## 2017-04-09 NOTE — Progress Notes (Signed)
Patient informed of labs. Kidney slightly improved. Cont same meds. Cologuard test is positive. Informed she will need colonoscopy. Patient agrees to this just wants to be "out cold during procedure." Needs order placed.

## 2017-04-14 ENCOUNTER — Telehealth: Payer: Self-pay | Admitting: Gastroenterology

## 2017-04-14 NOTE — Telephone Encounter (Signed)
Gastroenterology Pre-Procedure Review  Request Date:   Requesting Physician: Dr.    PATIENT REVIEW QUESTIONS: The patient responded to the following health history questions as indicated:    1. Are you having any GI issues? yes (positive cologard) 2. Do you have a personal history of Polyps? no 3. Do you have a family history of Colon Cancer or Polyps? no 4. Diabetes Mellitus? yes (type II) 5. Joint replacements in the past 12 months?no 6. Major health problems in the past 3 months?no 7. Any artificial heart valves, MVP, or defibrillator?no    MEDICATIONS & ALLERGIES:    Patient reports the following regarding taking any anticoagulation/antiplatelet therapy:   Plavix, Coumadin, Eliquis, Xarelto, Lovenox, Pradaxa, Brilinta, or Effient? no Aspirin? yes (ASA daily)  Patient confirms/reports the following medications:  Current Outpatient Medications  Medication Sig Dispense Refill   aspirin 81 MG tablet Take 1 tablet by mouth daily.     atorvastatin (LIPITOR) 40 MG tablet Take 1 tablet (40 mg total) by mouth daily. 90 tablet 1   clopidogrel (PLAVIX) 75 MG tablet TAKE 1 TABLET DAILY 90 tablet 3   fexofenadine (ALLEGRA) 180 MG tablet Take 180 mg by mouth daily.     ibuprofen (ADVIL,MOTRIN) 800 MG tablet Take 1 tablet (800 mg total) by mouth every 8 (eight) hours as needed for moderate pain. 15 tablet 0   lisinopril-hydrochlorothiazide (PRINZIDE,ZESTORETIC) 20-12.5 MG tablet TAKE 1 TABLET DAILY 90 tablet 3   meclizine (ANTIVERT) 12.5 MG tablet Take 1 tablet (12.5 mg total) by mouth 3 (three) times daily as needed for dizziness. 30 tablet 0   metFORMIN (GLUCOPHAGE) 500 MG tablet TAKE 1 TABLET TWICE A DAY 180 tablet 0   MULTIPLE VITAMIN PO Take by mouth.     sertraline (ZOLOFT) 50 MG tablet TAKE 1 TABLET DAILY 90 tablet 3   No current facility-administered medications for this visit.     Patient confirms/reports the following allergies:  Allergies  Allergen Reactions    Clavulanic Acid Diarrhea   Tetracycline     Other reaction(s): emesis   Cefaclor Rash   Cephalexin Rash   Sulfa Antibiotics Rash    Other reaction(s): emesis    No orders of the defined types were placed in this encounter.   AUTHORIZATION INFORMATION Primary Insurance: 1D#: Group #:  Secondary Insurance: 1D#: Group #:  SCHEDULE INFORMATION: Date:  Time: Location:

## 2017-04-17 ENCOUNTER — Other Ambulatory Visit: Payer: Self-pay

## 2017-04-17 DIAGNOSIS — R195 Other fecal abnormalities: Secondary | ICD-10-CM

## 2017-04-17 MED ORDER — PEG 3350-KCL-NA BICARB-NACL 420 G PO SOLR: 4000.0000 mL | Freq: Once | ORAL | 0 refills | Status: AC

## 2017-04-22 ENCOUNTER — Telehealth: Payer: Self-pay

## 2017-04-22 NOTE — Telephone Encounter (Signed)
Advised patient of hold instructions from Dr. Delana Meyer for Plavix.  Last day: 4/12 Restart: 4/20

## 2017-04-29 ENCOUNTER — Telehealth: Payer: Self-pay | Admitting: Gastroenterology

## 2017-04-29 NOTE — Telephone Encounter (Signed)
Please email instructions to pt she still has not received them

## 2017-05-05 ENCOUNTER — Telehealth: Payer: Self-pay | Admitting: Gastroenterology

## 2017-05-05 NOTE — Telephone Encounter (Signed)
Please e-mail directions today for prep. She never got them.

## 2017-05-08 ENCOUNTER — Encounter: Payer: Self-pay | Admitting: Anesthesiology

## 2017-05-08 ENCOUNTER — Ambulatory Visit: Payer: Medicare HMO | Admitting: Anesthesiology

## 2017-05-08 ENCOUNTER — Encounter
Admission: RE | Disposition: A | Discharge status: Home / Self Care | Payer: Self-pay | Source: Ambulatory Visit | Attending: Gastroenterology

## 2017-05-08 ENCOUNTER — Ambulatory Visit
Admission: RE | Admit: 2017-05-08 | Discharge: 2017-05-08 | Disposition: A | Discharge status: Home / Self Care | Payer: Medicare HMO | Source: Ambulatory Visit | Attending: Gastroenterology | Admitting: Gastroenterology

## 2017-05-08 DIAGNOSIS — K648 Other hemorrhoids: Secondary | ICD-10-CM | POA: Diagnosis not present

## 2017-05-08 DIAGNOSIS — Z96651 Presence of right artificial knee joint: Secondary | ICD-10-CM | POA: Insufficient documentation

## 2017-05-08 DIAGNOSIS — K5730 Diverticulosis of large intestine without perforation or abscess without bleeding: Secondary | ICD-10-CM | POA: Diagnosis not present

## 2017-05-08 DIAGNOSIS — I129 Hypertensive chronic kidney disease with stage 1 through stage 4 chronic kidney disease, or unspecified chronic kidney disease: Secondary | ICD-10-CM | POA: Insufficient documentation

## 2017-05-08 DIAGNOSIS — Z79899 Other long term (current) drug therapy: Secondary | ICD-10-CM | POA: Diagnosis not present

## 2017-05-08 DIAGNOSIS — Z7902 Long term (current) use of antithrombotics/antiplatelets: Secondary | ICD-10-CM | POA: Insufficient documentation

## 2017-05-08 DIAGNOSIS — Z7982 Long term (current) use of aspirin: Secondary | ICD-10-CM | POA: Insufficient documentation

## 2017-05-08 DIAGNOSIS — K640 First degree hemorrhoids: Secondary | ICD-10-CM

## 2017-05-08 DIAGNOSIS — Z7984 Long term (current) use of oral hypoglycemic drugs: Secondary | ICD-10-CM | POA: Diagnosis not present

## 2017-05-08 DIAGNOSIS — R195 Other fecal abnormalities: Secondary | ICD-10-CM | POA: Diagnosis not present

## 2017-05-08 DIAGNOSIS — Z87891 Personal history of nicotine dependence: Secondary | ICD-10-CM | POA: Insufficient documentation

## 2017-05-08 DIAGNOSIS — N183 Chronic kidney disease, stage 3 (moderate): Secondary | ICD-10-CM | POA: Diagnosis not present

## 2017-05-08 DIAGNOSIS — E785 Hyperlipidemia, unspecified: Secondary | ICD-10-CM | POA: Insufficient documentation

## 2017-05-08 DIAGNOSIS — E1151 Type 2 diabetes mellitus with diabetic peripheral angiopathy without gangrene: Secondary | ICD-10-CM | POA: Insufficient documentation

## 2017-05-08 DIAGNOSIS — Z1211 Encounter for screening for malignant neoplasm of colon: Secondary | ICD-10-CM | POA: Diagnosis not present

## 2017-05-08 DIAGNOSIS — E1122 Type 2 diabetes mellitus with diabetic chronic kidney disease: Secondary | ICD-10-CM | POA: Diagnosis not present

## 2017-05-08 DIAGNOSIS — N189 Chronic kidney disease, unspecified: Secondary | ICD-10-CM | POA: Insufficient documentation

## 2017-05-08 DIAGNOSIS — K5790 Diverticulosis of intestine, part unspecified, without perforation or abscess without bleeding: Secondary | ICD-10-CM | POA: Diagnosis not present

## 2017-05-08 HISTORY — PX: COLONOSCOPY WITH PROPOFOL: SHX5780

## 2017-05-08 LAB — GLUCOSE, CAPILLARY: GLUCOSE-CAPILLARY: 129 mg/dL — AB (ref 65–99)

## 2017-05-08 SURGERY — COLONOSCOPY WITH PROPOFOL
Anesthesia: General

## 2017-05-08 MED ORDER — LIDOCAINE HCL (PF) 1 % IJ SOLN
2.0000 mL | Freq: Once | INTRAMUSCULAR | Status: AC
  Administered 2017-05-08: 0.3 mL via INTRADERMAL

## 2017-05-08 MED ORDER — PROPOFOL 500 MG/50ML IV EMUL
INTRAVENOUS | Status: DC | PRN
  Administered 2017-05-08: 140 ug/kg/min via INTRAVENOUS

## 2017-05-08 MED ORDER — PROPOFOL 500 MG/50ML IV EMUL
INTRAVENOUS | Status: AC
  Filled 2017-05-08: qty 50

## 2017-05-08 MED ORDER — LIDOCAINE 2% (20 MG/ML) 5 ML SYRINGE
INTRAMUSCULAR | Status: DC | PRN
  Administered 2017-05-08: 30 mg via INTRAVENOUS

## 2017-05-08 MED ORDER — PROPOFOL 10 MG/ML IV BOLUS
INTRAVENOUS | Status: DC | PRN
  Administered 2017-05-08: 100 mg via INTRAVENOUS

## 2017-05-08 MED ORDER — PROPOFOL 10 MG/ML IV BOLUS
INTRAVENOUS | Status: AC
Start: 2017-05-08 — End: ?
  Filled 2017-05-08: qty 20

## 2017-05-08 MED ORDER — SODIUM CHLORIDE 0.9 % IV SOLN
INTRAVENOUS | Status: DC
  Administered 2017-05-08: 09:00:00 via INTRAVENOUS

## 2017-05-08 MED ORDER — PHENYLEPHRINE HCL 10 MG/ML IJ SOLN
INTRAMUSCULAR | Status: DC | PRN
  Administered 2017-05-08 (×2): 100 ug via INTRAVENOUS

## 2017-05-08 MED ORDER — FENTANYL CITRATE (PF) 100 MCG/2ML IJ SOLN
INTRAMUSCULAR | Status: AC
  Filled 2017-05-08: qty 2

## 2017-05-08 MED ORDER — FENTANYL CITRATE (PF) 100 MCG/2ML IJ SOLN
INTRAMUSCULAR | Status: DC | PRN
  Administered 2017-05-08 (×2): 50 ug via INTRAVENOUS

## 2017-05-08 MED ORDER — LIDOCAINE HCL (PF) 1 % IJ SOLN
INTRAMUSCULAR | Status: AC
  Administered 2017-05-08: 0.3 mL via INTRADERMAL
  Filled 2017-05-08: qty 2

## 2017-05-08 MED ORDER — EPHEDRINE SULFATE 50 MG/ML IJ SOLN
INTRAMUSCULAR | Status: AC
  Filled 2017-05-08: qty 1

## 2017-05-08 NOTE — Transfer of Care (Signed)
Immediate Anesthesia Transfer of Care Note  Patient: Misty Villarreal  Procedure(s) Performed: COLONOSCOPY WITH PROPOFOL (N/A )  Patient Location: PACU and Endoscopy Unit  Anesthesia Type:General  Level of Consciousness: sedated  Airway & Oxygen Therapy: Patient Spontanous Breathing and Patient connected to nasal cannula oxygen  Post-op Assessment: Report given to RN and Post -op Vital signs reviewed and stable  Post vital signs: Reviewed and stable  Last Vitals:  Vitals Value Taken Time  BP    Temp    Pulse    Resp    SpO2      Last Pain:  Vitals:   05/08/17 0832  PainSc: 0-No pain         Complications: No apparent anesthesia complications

## 2017-05-08 NOTE — Anesthesia Preprocedure Evaluation (Addendum)
Anesthesia Evaluation  Patient identified by MRN, date of birth, ID band Patient awake    Reviewed: Allergy & Precautions, NPO status , Patient's Chart, lab work & pertinent test results, reviewed documented beta blocker date and time   Airway Mallampati: III  TM Distance: >3 FB     Dental  (+) Chipped   Pulmonary former smoker,           Cardiovascular hypertension, Pt. on medications + Peripheral Vascular Disease       Neuro/Psych PSYCHIATRIC DISORDERS Depression    GI/Hepatic   Endo/Other  diabetes, Type 2  Renal/GU Renal disease     Musculoskeletal  (+) Arthritis ,   Abdominal   Peds  Hematology  (+) anemia ,   Anesthesia Other Findings Hx of tach. Meningioma. On plavix. Stent in the leg.  Reproductive/Obstetrics                            Anesthesia Physical Anesthesia Plan  ASA: III  Anesthesia Plan: General   Post-op Pain Management:    Induction: Intravenous  PONV Risk Score and Plan:   Airway Management Planned:   Additional Equipment:   Intra-op Plan:   Post-operative Plan:   Informed Consent: I have reviewed the patients History and Physical, chart, labs and discussed the procedure including the risks, benefits and alternatives for the proposed anesthesia with the patient or authorized representative who has indicated his/her understanding and acceptance.     Plan Discussed with: CRNA  Anesthesia Plan Comments:         Anesthesia Quick Evaluation

## 2017-05-08 NOTE — Anesthesia Post-op Follow-up Note (Signed)
Anesthesia QCDR form completed.        

## 2017-05-08 NOTE — H&P (Signed)
Jonathon Bellows, MD [address] [place], [place], [place], [address] [address], [place], [place], [place], [address] Phone: [phone]  Fax: [phone]  Primary Care Physician:  Glean Hess, MD   Pre-Procedure History & Physical: HPI:  Misty Villarreal is a 67 y.o. female is here for an colonoscopy.   Past Medical History:  Diagnosis Date  . Chronic kidney disease   . Diabetes mellitus without complication ([place])   . Hyperlipidemia     Past Surgical History:  Procedure Laterality Date  . ANGIOPLASTY / STENTING FEMORAL Left 2014, 2013  . BRAIN MENINGIOMA EXCISION  1988  . BREAST BIOPSY Right 1989   benign  . CARPAL TUNNEL RELEASE Left 1980  . CHOLECYSTECTOMY  1987  . KNEE ARTHROPLASTY Right 2009    Prior to Admission medications   Medication Sig Start Date End Date Taking? Authorizing Provider  aspirin 81 MG tablet Take 1 tablet by mouth daily.    [provider]  atorvastatin (LIPITOR) 40 MG tablet Take 1 tablet (40 mg total) by mouth daily. 12/11/16   Glean Hess, MD  clopidogrel (PLAVIX) 75 MG tablet TAKE 1 TABLET DAILY 01/27/17   Stegmayer, Janalyn Harder, PA-C  fexofenadine (ALLEGRA) 180 MG tablet Take 180 mg by mouth daily.    [provider]  ibuprofen (ADVIL,MOTRIN) 800 MG tablet Take 1 tablet (800 mg total) by mouth every 8 (eight) hours as needed for moderate pain. 01/27/17   Paulette Blanch, MD  lisinopril-hydrochlorothiazide (PRINZIDE,ZESTORETIC) 20-12.5 MG tablet TAKE 1 TABLET DAILY 01/27/17   Glean Hess, MD  meclizine (ANTIVERT) 12.5 MG tablet Take 1 tablet (12.5 mg total) by mouth 3 (three) times daily as needed for dizziness. 08/05/16   Glean Hess, MD  metFORMIN (GLUCOPHAGE) 500 MG tablet TAKE 1 TABLET TWICE A DAY 03/31/17   Glean Hess, MD  MULTIPLE VITAMIN PO Take by mouth.    [provider]  sertraline (ZOLOFT) 50 MG tablet TAKE 1 TABLET DAILY 07/30/16   Glean Hess, MD    Allergies as of  04/17/2017 - Review Complete 03/13/2017  Allergen Reaction Noted  . Clavulanic acid Diarrhea 02/05/2016  . Tetracycline  09/01/2014  . Cefaclor Rash 09/13/2014  . Cephalexin Rash 09/13/2014  . Sulfa antibiotics Rash 09/01/2014    Family History  Problem Relation Age of Onset  . Diabetes Mother   . Heart failure Father   . Diabetes Father     Social History   Socioeconomic History  . Marital status: Married    Spouse name: Not on file  . Number of children: Not on file  . Years of education: Not on file  . Highest education level: Not on file  Occupational History  . Not on file  Social Needs  . Financial resource strain: Not on file  . Food insecurity:    Worry: Not on file    Inability: Not on file  . Transportation needs:    Medical: Not on file    Non-medical: Not on file  Tobacco Use  . Smoking status: Former Research scientist (life sciences)  . Smokeless tobacco: Former Systems developer    Quit date: 12/21/2013  Substance and Sexual Activity  . Alcohol use: Yes    Alcohol/week: 1.2 oz    Types: 2 Standard drinks or equivalent per week    Comment: occasional  . Drug use: No  . Sexual activity: Not on file  Lifestyle  . Physical activity:    Days  per week: Not on file    Minutes per session: Not on file  . Stress: Not on file  Relationships  . Social connections:    Talks on phone: Not on file    Gets together: Not on file    Attends religious service: Not on file    Active member of club or organization: Not on file    Attends meetings of clubs or organizations: Not on file    Relationship status: Not on file  . Intimate partner violence:    Fear of current or ex partner: Not on file    Emotionally abused: Not on file    Physically abused: Not on file    Forced sexual activity: Not on file  Other Topics Concern  . Not on file  Social History Narrative  . Not on file    Review of Systems: See HPI, otherwise negative ROS  Physical Exam: BP 114/82   Pulse 80   Temp (!) 96.9 F  (36.1 C)   Resp 16   Ht 5\' 2"  (1.575 m)   Wt 181 lb (82.1 kg)   LMP  (LMP Unknown)   SpO2 99%   BMI 33.11 kg/m  General:   Alert,  pleasant and cooperative in NAD Head:  Normocephalic and atraumatic. Neck:  Supple; no masses or thyromegaly. Lungs:  Clear throughout to auscultation, normal respiratory effort.    Heart:  +S1, +S2, Regular rate and rhythm, No edema. Abdomen:  Soft, nontender and nondistended. Normal bowel sounds, without guarding, and without rebound.   Neurologic:  Alert and  oriented x4;  grossly normal neurologically.  Impression/Plan: Misty Villarreal is here for an colonoscopy to be performed for a positive cologuard test  Risks, benefits, limitations, and alternatives regarding  colonoscopy have been reviewed with the patient.  Questions have been answered.  All parties agreeable.   Jonathon Bellows, MD  05/08/2017, 8:47 AM

## 2017-05-08 NOTE — Op Note (Signed)
[facility] Gastroenterology Patient Name: Misty Villarreal Procedure Date: 05/08/2017 9:02 AM MRN: [id number] Account #: [id number] Date of Birth: 22-Oct-1950 Admit Type: Outpatient Age: 67 Room: [facility] ENDO ROOM 3 Gender: Female Note Status: Finalized Procedure:            Colonoscopy Indications:          Positive Cologuard test Providers:            Jonathon Bellows MD, MD Referring MD:         Halina Maidens, MD (Referring MD) Medicines:            Monitored Anesthesia Care Complications:        No immediate complications. Procedure:            Pre-Anesthesia Assessment:                       - Prior to the procedure, a History and Physical was                        performed, and patient medications, allergies and                        sensitivities were reviewed. The patient's tolerance of                        previous anesthesia was reviewed.                       - The risks and benefits of the procedure and the                        sedation options and risks were discussed with the                        patient. All questions were answered and informed                        consent was obtained.                       - ASA Grade Assessment: II - A patient with mild                        systemic disease.                       After obtaining informed consent, the colonoscope was                        passed under direct vision. Throughout the procedure,                        the patient's blood pressure, pulse, and oxygen                        saturations were monitored continuously. The                        Colonoscope was introduced through the anus and                        advanced  to the the cecum, identified by the                        appendiceal orifice, IC valve and transillumination.                        The colonoscopy was performed with ease. The patient                        tolerated the procedure well. The quality of the bowel                       preparation was good. Findings:      The perianal and digital rectal examinations were normal.      Multiple small-mouthed diverticula were found in the entire colon.      Non-bleeding internal hemorrhoids were found during retroflexion. The       hemorrhoids were medium-sized and Grade I (internal hemorrhoids that do       not prolapse).      The exam was otherwise without abnormality on direct and retroflexion       views. Impression:           - Diverticulosis in the entire examined colon.                       - Non-bleeding internal hemorrhoids.                       - The examination was otherwise normal on direct and                        retroflexion views.                       - No specimens collected. Recommendation:       - Discharge patient to home (with escort).                       - Resume previous diet.                       - Continue present medications. Procedure Code(s):    --- Professional ---                       [id number], Colonoscopy, flexible; diagnostic, including                        collection of specimen(s) by brushing or washing, when                        performed (separate procedure) Diagnosis Code(s):    --- Professional ---                       K64.0, First degree hemorrhoids                       R19.5, Other fecal abnormalities                       K57.30, Diverticulosis of large intestine without  perforation or abscess without bleeding CPT copyright 2017 American Medical Association. All rights reserved. The codes documented in this report are preliminary and upon coder review may  be revised to meet current compliance requirements. Jonathon Bellows, MD Jonathon Bellows MD, MD 05/08/2017 9:33:42 AM This report has been signed electronically. Number of Addenda: 0 Note Initiated On: 05/08/2017 9:02 AM Scope Withdrawal Time: 0 hours 17 minutes 24 seconds  Total Procedure Duration: 0 hours 25 minutes 8 seconds        [facility]

## 2017-05-08 NOTE — Anesthesia Postprocedure Evaluation (Signed)
Anesthesia Post Note  Patient: Misty Villarreal  Procedure(s) Performed: COLONOSCOPY WITH PROPOFOL (N/A )  Patient location during evaluation: Endoscopy Anesthesia Type: General Level of consciousness: awake and alert Pain management: pain level controlled Vital Signs Assessment: post-procedure vital signs reviewed and stable Respiratory status: spontaneous breathing, nonlabored ventilation, respiratory function stable and patient connected to nasal cannula oxygen Cardiovascular status: blood pressure returned to baseline and stable Postop Assessment: no apparent nausea or vomiting Anesthetic complications: no     Last Vitals:  Vitals:   05/08/17 0950 05/08/17 1000  BP: (!) 109/42 114/62  Pulse: 83 69  Resp: (!) 27 19  Temp:    SpO2: 100% 100%    Last Pain:  Vitals:   05/08/17 0930  TempSrc: Tympanic  PainSc:                  [name] S

## 2017-05-09 ENCOUNTER — Encounter: Payer: Self-pay | Admitting: Gastroenterology

## 2017-05-10 DIAGNOSIS — E119 Type 2 diabetes mellitus without complications: Secondary | ICD-10-CM | POA: Diagnosis not present

## 2017-05-27 ENCOUNTER — Other Ambulatory Visit: Payer: Self-pay | Admitting: Internal Medicine

## 2017-05-30 ENCOUNTER — Other Ambulatory Visit: Payer: Self-pay

## 2017-06-18 ENCOUNTER — Ambulatory Visit: Payer: Medicare HMO | Admitting: Internal Medicine

## 2017-06-18 ENCOUNTER — Encounter: Payer: Self-pay | Admitting: Internal Medicine

## 2017-06-18 VITALS — BP 122/80 | HR 80 | Temp 98.1°F | Resp 16 | Ht 62.0 in | Wt 178.0 lb

## 2017-06-18 DIAGNOSIS — K625 Hemorrhage of anus and rectum: Secondary | ICD-10-CM

## 2017-06-18 DIAGNOSIS — R300 Dysuria: Secondary | ICD-10-CM | POA: Diagnosis not present

## 2017-06-18 DIAGNOSIS — N3001 Acute cystitis with hematuria: Secondary | ICD-10-CM | POA: Diagnosis not present

## 2017-06-18 LAB — POCT URINALYSIS DIPSTICK
BILIRUBIN UA: NEGATIVE
Glucose, UA: NEGATIVE
Ketones, UA: NEGATIVE
NITRITE UA: NEGATIVE
PH UA: 5 (ref 5.0–8.0)
Protein, UA: POSITIVE — AB
Spec Grav, UA: 1.015 (ref 1.010–1.025)
UROBILINOGEN UA: 0.2 U/dL

## 2017-06-18 MED ORDER — NITROFURANTOIN MONOHYD MACRO 100 MG PO CAPS: 100.0000 mg | ORAL_CAPSULE | Freq: Two times a day (BID) | ORAL | 0 refills | Status: AC

## 2017-06-18 NOTE — Patient Instructions (Signed)
Hold Plavix for 4 days then resume if no further bleeding.

## 2017-06-18 NOTE — Progress Notes (Signed)
Date:  06/18/2017   Name:  Misty Villarreal   DOB:  07-21-1950   MRN:  [id number]   Chief Complaint: Abdominal Pain and Blood In Stools Abdominal Pain  This is a new problem. The current episode started in the past 7 days. The problem has been gradually improving. The pain is located in the RLQ and LLQ. The pain is mild. The quality of the pain is cramping. Associated symptoms include dysuria, hematochezia and vomiting. Pertinent negatives include no fever, headaches, hematuria or melena. She has tried nothing for the symptoms.  Dysuria   This is a new problem. The current episode started yesterday. The problem occurs every urination. The quality of the pain is described as burning. The pain is mild. There has been no fever. Associated symptoms include vomiting. Pertinent negatives include no chills, flank pain, hematuria or urgency.   Colonoscopy 6 months ago - diverticuli, internal hemorrhoids but no polyps   Review of Systems  Constitutional: Negative for chills, fatigue and fever.  Respiratory: Negative for chest tightness and shortness of breath.   Cardiovascular: Negative for chest pain and palpitations.  Gastrointestinal: Positive for abdominal pain, blood in stool (passed some medium sized clots), hematochezia and vomiting. Negative for melena.  Genitourinary: Positive for dysuria. Negative for flank pain, hematuria and urgency.  Neurological: Negative for dizziness and headaches.    Patient Active Problem List   Diagnosis Date Noted  . Slurred speech 12/03/2016  . Neck pain on right side 08/05/2016  . Tinnitus of right ear 08/05/2016  . Type 2 diabetes mellitus with stage 3 chronic kidney disease, without long-term current use of insulin ([place]) 08/03/2015  . Anemia 06/24/2015  . Localized edema 06/21/2015  . History of paroxysmal supraventricular tachycardia 06/21/2015  . PAD (peripheral artery disease) ([place]) 02/02/2015  . Hyperlipidemia 09/01/2014  . Clinical  depression 09/01/2014  . Neuropathy 09/01/2014  . Phlebectasia 09/01/2014  . Abnormal glandular Papanicolaou smear of cervix 09/01/2014  . DM (diabetes mellitus), type 2 with peripheral vascular complications ([place]) 67/67/2094  . Essential (primary) hypertension 09/01/2014  . Spondylolisthesis at L4-L5 level 07/05/2014  . Arthritis, degenerative 01/31/2014    Prior to Admission medications   Medication Sig Start Date End Date Taking? Authorizing Provider  aspirin 81 MG tablet Take 1 tablet by mouth daily.   Yes [provider]  atorvastatin (LIPITOR) 40 MG tablet Take 1 tablet (40 mg total) by mouth daily. 12/11/16  Yes Glean Hess, MD  carisoprodol (SOMA) 350 MG tablet Take by mouth.   Yes [provider]  clopidogrel (PLAVIX) 75 MG tablet TAKE 1 TABLET DAILY 01/27/17  Yes Stegmayer, [name] A, PA-C  estradiol (ESTRACE) 1 MG tablet Take by mouth.   Yes [provider]  fexofenadine (ALLEGRA) 180 MG tablet Take 180 mg by mouth daily.   Yes [provider]  glipiZIDE (GLUCOTROL) 5 MG tablet Take by mouth.   Yes [provider]  ibuprofen (ADVIL,MOTRIN) 800 MG tablet Take 1 tablet (800 mg total) by mouth every 8 (eight) hours as needed for moderate pain. 01/27/17  Yes Paulette Blanch, MD  lisinopril-hydrochlorothiazide (PRINZIDE,ZESTORETIC) 20-12.5 MG tablet TAKE 1 TABLET DAILY 01/27/17  Yes Glean Hess, MD  meclizine (ANTIVERT) 12.5 MG tablet TAKE 1 TABLET(12.5 MG) BY MOUTH THREE TIMES DAILY AS NEEDED FOR DIZZINESS 05/27/17  Yes Glean Hess, MD  metFORMIN (GLUCOPHAGE) 500 MG tablet TAKE 1 TABLET TWICE A DAY 03/31/17  Yes Glean Hess, MD  morphine (MS  CONTIN) 30 MG 12 hr tablet Take by mouth.   Yes [provider]  MULTIPLE VITAMIN PO Take by mouth.   Yes [provider]  omeprazole (PRILOSEC) 40 MG capsule Take by mouth.   Yes [provider]  ondansetron (ZOFRAN) 4 MG tablet Take by mouth.   Yes [provider]  pramipexole (MIRAPEX) 1 MG tablet Take by mouth.   Yes [provider]  QUEtiapine (SEROQUEL) 400 MG tablet Take by mouth.   Yes [provider]  sertraline (ZOLOFT) 50 MG tablet TAKE 1 TABLET DAILY 07/30/16  Yes Glean Hess, MD  topiramate (TOPAMAX) 50 MG tablet Take by mouth.   Yes [provider]    Allergies  Allergen Reactions  . Clavulanic Acid Diarrhea  . Tetracycline     Other reaction(s): emesis  . Cefaclor Rash  . Cephalexin Rash  . Sulfa Antibiotics Rash    Other reaction(s): emesis    Past Surgical History:  Procedure Laterality Date  . ANGIOPLASTY / STENTING FEMORAL Left 2014, 2013  . BRAIN MENINGIOMA EXCISION  1988  . BREAST BIOPSY Right 1989   benign  . CARPAL TUNNEL RELEASE Left 1980  . CHOLECYSTECTOMY  1987  . COLONOSCOPY WITH PROPOFOL N/A 05/08/2017   Procedure: COLONOSCOPY WITH PROPOFOL;  Surgeon: Jonathon Bellows, MD;  Location: [facility] ENDOSCOPY;  Service: Gastroenterology;  Laterality: N/A;  . KNEE ARTHROPLASTY Right 2009    Social History   Tobacco Use  . Smoking status: Former Research scientist (life sciences)  . Smokeless tobacco: Former Systems developer    Quit date: 12/21/2013  Substance Use Topics  . Alcohol use: Yes    Alcohol/week: 1.2 oz    Types: 2 Standard drinks or equivalent per week    Comment: occasional  . Drug use: No     Medication list has been reviewed and updated.  Current Meds  Medication Sig  . aspirin 81 MG tablet Take 1 tablet by mouth daily.  Marland Kitchen atorvastatin (LIPITOR) 40 MG tablet Take 1 tablet (40 mg total) by mouth daily.  . carisoprodol (SOMA) 350 MG tablet Take by mouth.  . clopidogrel (PLAVIX) 75 MG tablet TAKE 1 TABLET DAILY  . estradiol (ESTRACE) 1 MG tablet Take by mouth.  . fexofenadine (ALLEGRA) 180 MG tablet Take 180 mg by mouth daily.  Marland Kitchen glipiZIDE (GLUCOTROL) 5 MG tablet Take by mouth.  Marland Kitchen ibuprofen (ADVIL,MOTRIN) 800 MG tablet Take 1 tablet (800 mg total) by mouth every 8 (eight) hours as needed for  moderate pain.  Marland Kitchen lisinopril-hydrochlorothiazide (PRINZIDE,ZESTORETIC) 20-12.5 MG tablet TAKE 1 TABLET DAILY  . meclizine (ANTIVERT) 12.5 MG tablet TAKE 1 TABLET(12.5 MG) BY MOUTH THREE TIMES DAILY AS NEEDED FOR DIZZINESS  . metFORMIN (GLUCOPHAGE) 500 MG tablet TAKE 1 TABLET TWICE A DAY  . morphine (MS CONTIN) 30 MG 12 hr tablet Take by mouth.  . MULTIPLE VITAMIN PO Take by mouth.  Marland Kitchen omeprazole (PRILOSEC) 40 MG capsule Take by mouth.  . ondansetron (ZOFRAN) 4 MG tablet Take by mouth.  . pramipexole (MIRAPEX) 1 MG tablet Take by mouth.  . QUEtiapine (SEROQUEL) 400 MG tablet Take by mouth.  . sertraline (ZOLOFT) 50 MG tablet TAKE 1 TABLET DAILY  . topiramate (TOPAMAX) 50 MG tablet Take by mouth.    PHQ 2/9 Scores 06/18/2017 03/13/2017 12/11/2016 08/05/2016  PHQ - 2 Score 0 0 0 0  PHQ- 9 Score - 0 0 -    Physical Exam  Constitutional: She is oriented to person, place, and time. She appears  well-developed. No distress.  HENT:  Head: Normocephalic and atraumatic.  Cardiovascular: Normal rate and regular rhythm.  Pulmonary/Chest: Effort normal. No respiratory distress.  Abdominal: Normal appearance and bowel sounds are normal. There is tenderness in the right lower quadrant and left lower quadrant. There is no rigidity, no rebound, no guarding and no CVA tenderness.  Musculoskeletal: Normal range of motion.  Neurological: She is alert and oriented to person, place, and time.  Skin: Skin is warm and dry. No rash noted.  Psychiatric: She has a normal mood and affect. Her behavior is normal. Thought content normal.  Nursing note and vitals reviewed.   BP 122/80   Pulse 80   Temp 98.1 F (36.7 C) (Oral)   Resp 16   Ht 5\' 2"  (1.575 m)   Wt 178 lb (80.7 kg)   LMP  (LMP Unknown)   SpO2 100%   BMI 32.56 kg/m   Assessment and Plan: 1. Acute cystitis with hematuria Increase fluids - nitrofurantoin, macrocrystal-monohydrate, (MACROBID) 100 MG capsule; Take 1 capsule (100 mg total) by  mouth 2 (two) times daily for 7 days.  Dispense: 14 capsule; Refill: 0  2. Dysuria As above - POCT Urinalysis Dipstick  3. Rectal bleeding Hold Plavix Suspect due to internal hemorrhoid noted on colonoscopy last month Follow up or go to ED if bleeding recurs - CBC with Differential/Platelet   Meds ordered this encounter  Medications  . nitrofurantoin, macrocrystal-monohydrate, (MACROBID) 100 MG capsule    Sig: Take 1 capsule (100 mg total) by mouth 2 (two) times daily for 7 days.    Dispense:  14 capsule    Refill:  0    Partially dictated using Editor, commissioning. Any errors are unintentional.  Halina Maidens, MD [facility]  06/18/2017

## 2017-06-19 LAB — CBC WITH DIFFERENTIAL/PLATELET
Basophils Absolute: 0 10*3/uL (ref 0.0–0.2)
Basos: 0 %
EOS (ABSOLUTE): 0.2 10*3/uL (ref 0.0–0.4)
EOS: 2 %
HEMATOCRIT: 31 % — AB (ref 34.0–46.6)
Hemoglobin: 10.4 g/dL — ABNORMAL LOW (ref 11.1–15.9)
Immature Grans (Abs): 0 10*3/uL (ref 0.0–0.1)
Immature Granulocytes: 0 %
LYMPHS ABS: 1.6 10*3/uL (ref 0.7–3.1)
Lymphs: 15 %
MCH: 28.9 pg (ref 26.6–33.0)
MCHC: 33.5 g/dL (ref 31.5–35.7)
MCV: 86 fL (ref 79–97)
MONOS ABS: 0.6 10*3/uL (ref 0.1–0.9)
Monocytes: 6 %
NEUTROS ABS: 8.2 10*3/uL — AB (ref 1.4–7.0)
Neutrophils: 77 %
Platelets: 280 10*3/uL (ref 150–450)
RBC: 3.6 x10E6/uL — ABNORMAL LOW (ref 3.77–5.28)
RDW: 14.6 % (ref 12.3–15.4)
WBC: 10.6 10*3/uL (ref 3.4–10.8)

## 2017-07-02 ENCOUNTER — Telehealth: Payer: Self-pay | Admitting: Internal Medicine

## 2017-07-02 NOTE — Telephone Encounter (Signed)
Called to reschedule same day appt.

## 2017-07-08 ENCOUNTER — Other Ambulatory Visit: Payer: Self-pay | Admitting: Internal Medicine

## 2017-07-08 DIAGNOSIS — F32A Depression, unspecified: Secondary | ICD-10-CM

## 2017-07-08 DIAGNOSIS — F329 Major depressive disorder, single episode, unspecified: Secondary | ICD-10-CM

## 2017-07-10 ENCOUNTER — Other Ambulatory Visit: Payer: Self-pay | Admitting: Internal Medicine

## 2017-07-10 DIAGNOSIS — E782 Mixed hyperlipidemia: Secondary | ICD-10-CM

## 2017-08-04 ENCOUNTER — Ambulatory Visit: Payer: Medicare HMO

## 2017-08-04 ENCOUNTER — Encounter: Payer: Self-pay | Admitting: Internal Medicine

## 2017-08-04 ENCOUNTER — Ambulatory Visit (INDEPENDENT_AMBULATORY_CARE_PROVIDER_SITE_OTHER): Payer: Medicare HMO | Admitting: Internal Medicine

## 2017-08-04 VITALS — BP 116/64 | HR 82 | Ht 62.0 in | Wt 179.0 lb

## 2017-08-04 DIAGNOSIS — I10 Essential (primary) hypertension: Secondary | ICD-10-CM

## 2017-08-04 DIAGNOSIS — E1122 Type 2 diabetes mellitus with diabetic chronic kidney disease: Secondary | ICD-10-CM

## 2017-08-04 DIAGNOSIS — Z0001 Encounter for general adult medical examination with abnormal findings: Secondary | ICD-10-CM

## 2017-08-04 DIAGNOSIS — F324 Major depressive disorder, single episode, in partial remission: Secondary | ICD-10-CM | POA: Diagnosis not present

## 2017-08-04 DIAGNOSIS — N183 Chronic kidney disease, stage 3 unspecified: Secondary | ICD-10-CM

## 2017-08-04 DIAGNOSIS — E2839 Other primary ovarian failure: Secondary | ICD-10-CM

## 2017-08-04 DIAGNOSIS — E1169 Type 2 diabetes mellitus with other specified complication: Secondary | ICD-10-CM | POA: Diagnosis not present

## 2017-08-04 DIAGNOSIS — R69 Illness, unspecified: Secondary | ICD-10-CM | POA: Diagnosis not present

## 2017-08-04 DIAGNOSIS — Z1231 Encounter for screening mammogram for malignant neoplasm of breast: Secondary | ICD-10-CM

## 2017-08-04 DIAGNOSIS — E1151 Type 2 diabetes mellitus with diabetic peripheral angiopathy without gangrene: Secondary | ICD-10-CM | POA: Diagnosis not present

## 2017-08-04 DIAGNOSIS — E785 Hyperlipidemia, unspecified: Secondary | ICD-10-CM | POA: Diagnosis not present

## 2017-08-04 DIAGNOSIS — Z0000 Encounter for general adult medical examination without abnormal findings: Secondary | ICD-10-CM

## 2017-08-04 LAB — POCT URINALYSIS DIPSTICK
Bilirubin, UA: NEGATIVE
Blood, UA: NEGATIVE
GLUCOSE UA: NEGATIVE
KETONES UA: NEGATIVE
Leukocytes, UA: NEGATIVE
Nitrite, UA: NEGATIVE
PROTEIN UA: NEGATIVE
Spec Grav, UA: 1.025 (ref 1.010–1.025)
Urobilinogen, UA: 0.2 E.U./dL
pH, UA: 5 (ref 5.0–8.0)

## 2017-08-04 MED ORDER — MECLIZINE HCL 12.5 MG PO TABS: 12.5000 mg | ORAL_TABLET | Freq: Two times a day (BID) | ORAL | 5 refills | Status: DC | PRN

## 2017-08-04 NOTE — Progress Notes (Signed)
Date:  08/04/2017   Name:  Lenoria Narine   DOB:  05-01-1950   MRN:  [id number]   Chief Complaint: Medicare Wellness (Patient declined MAW with Amy. Breast Exam.) Monaye Blackie is a 67 y.o. female who presents today for her Complete Annual Exam. She feels well. She reports exercising regularly. She reports she is sleeping fairly well. She had colonoscopy earlier this year.  She is due for a mammogram and a DM eye exam.  She has never had a DEXA.  Diabetes  She presents for her follow-up diabetic visit. She has type 2 diabetes mellitus. Her disease course has been stable. Pertinent negatives for hypoglycemia include no dizziness, headaches, nervousness/anxiousness or tremors. Pertinent negatives for diabetes include no chest pain, no fatigue, no polydipsia and no polyuria. Symptoms are stable. Current diabetic treatment includes oral agent (dual therapy) (metformin and glipizide). Her weight is stable. She is following a generally healthy diet. She monitors blood glucose at home 3-4 x per week. There is no change in her home blood glucose trend. Her breakfast blood glucose is taken between 8-9 am. Her breakfast blood glucose range is generally 130-140 mg/dl. An ACE inhibitor/angiotensin II receptor blocker is being taken. Eye exam is not current.  Hypertension  This is a chronic problem. The problem is controlled. Pertinent negatives include no chest pain, headaches, palpitations or shortness of breath. Risk factors for coronary artery disease include diabetes mellitus. Past treatments include ACE inhibitors and diuretics. The current treatment provides significant improvement. Hypertensive end-organ damage includes kidney disease. Identifiable causes of hypertension include chronic renal disease.  Hyperlipidemia  This is a chronic problem. The problem is controlled. Exacerbating diseases include chronic renal disease and diabetes. Pertinent negatives include no chest pain or shortness of  breath. Current antihyperlipidemic treatment includes statins. The current treatment provides significant improvement of lipids. There are no compliance problems.   Depression         This is a chronic problem.  The problem occurs rarely.The problem is unchanged.  Associated symptoms include no fatigue and no headaches.  Past treatments include SSRIs - Selective serotonin reuptake inhibitors.    Review of Systems  Constitutional: Negative for chills, fatigue and fever.  HENT: Negative for congestion, hearing loss, tinnitus, trouble swallowing and voice change.   Eyes: Negative for visual disturbance.  Respiratory: Negative for cough, chest tightness, shortness of breath and wheezing.   Cardiovascular: Negative for chest pain, palpitations and leg swelling.  Gastrointestinal: Negative for abdominal pain, constipation, diarrhea and vomiting.  Endocrine: Negative for polydipsia and polyuria.  Genitourinary: Negative for dysuria, frequency, genital sores, vaginal bleeding and vaginal discharge.  Musculoskeletal: Negative for arthralgias, gait problem and joint swelling.  Skin: Negative for color change and rash.  Neurological: Negative for dizziness, tremors, light-headedness and headaches.  Hematological: Negative for adenopathy. Does not bruise/bleed easily.  Psychiatric/Behavioral: Positive for depression. Negative for dysphoric mood and sleep disturbance. The patient is not nervous/anxious.     Patient Active Problem List   Diagnosis Date Noted  . Major depressive disorder with single episode, in partial remission ([place]) 08/04/2017  . Neck pain on right side 08/05/2016  . Tinnitus of right ear 08/05/2016  . Type 2 diabetes mellitus with stage 3 chronic kidney disease, without long-term current use of insulin ([place]) 08/03/2015  . Anemia 06/24/2015  . Localized edema 06/21/2015  . History of paroxysmal supraventricular tachycardia 06/21/2015  . PAD (peripheral artery disease) ([place])  02/02/2015  . Hyperlipidemia associated with type  2 diabetes mellitus ([place]) 09/01/2014  . Neuropathy 09/01/2014  . Phlebectasia 09/01/2014  . Abnormal glandular Papanicolaou smear of cervix 09/01/2014  . DM (diabetes mellitus), type 2 with peripheral vascular complications ([place]) 35/59/7416  . Essential (primary) hypertension 09/01/2014  . Spondylolisthesis at L4-L5 level 07/05/2014  . Arthritis, degenerative 01/31/2014    Prior to Admission medications   Medication Sig Start Date End Date Taking? Authorizing Provider  aspirin 81 MG tablet Take 1 tablet by mouth daily.    [provider]  atorvastatin (LIPITOR) 40 MG tablet TAKE 1 TABLET DAILY 07/10/17   Glean Hess, MD  carisoprodol (SOMA) 350 MG tablet Take by mouth.    [provider]  clopidogrel (PLAVIX) 75 MG tablet TAKE 1 TABLET DAILY 01/27/17   Stegmayer, [name] A, PA-C  estradiol (ESTRACE) 1 MG tablet Take by mouth.    [provider]  fexofenadine (ALLEGRA) 180 MG tablet Take 180 mg by mouth daily.    [provider]  glipiZIDE (GLUCOTROL) 5 MG tablet Take by mouth.    [provider]  ibuprofen (ADVIL,MOTRIN) 800 MG tablet Take 1 tablet (800 mg total) by mouth every 8 (eight) hours as needed for moderate pain. 01/27/17   Paulette Blanch, MD  lisinopril-hydrochlorothiazide (PRINZIDE,ZESTORETIC) 20-12.5 MG tablet TAKE 1 TABLET DAILY 01/27/17   Glean Hess, MD  meclizine (ANTIVERT) 12.5 MG tablet TAKE 1 TABLET(12.5 MG) BY MOUTH THREE TIMES DAILY AS NEEDED FOR DIZZINESS 05/27/17   Glean Hess, MD  metFORMIN (GLUCOPHAGE) 500 MG tablet TAKE 1 TABLET TWICE A DAY 07/08/17   Glean Hess, MD  morphine (MS CONTIN) 30 MG 12 hr tablet Take by mouth.    [provider]  MULTIPLE VITAMIN PO Take by mouth.    [provider]  omeprazole (PRILOSEC) 40 MG capsule Take by mouth.    [provider]  ondansetron (ZOFRAN) 4 MG tablet Take by mouth.    [provider]  pramipexole (MIRAPEX) 1 MG tablet Take by mouth.    [provider]  QUEtiapine (SEROQUEL) 400 MG tablet Take by mouth.    [provider]  sertraline (ZOLOFT) 50 MG tablet TAKE 1 TABLET DAILY 07/08/17   Glean Hess, MD  topiramate (TOPAMAX) 50 MG tablet Take by mouth.    [provider]    Allergies  Allergen Reactions  . Clavulanic Acid Diarrhea  . Tetracycline     Other reaction(s): emesis  . Cefaclor Rash  . Cephalexin Rash  . Sulfa Antibiotics Rash    Other reaction(s): emesis    Past Surgical History:  Procedure Laterality Date  . ANGIOPLASTY / STENTING FEMORAL Left 2014, 2013  . BRAIN MENINGIOMA EXCISION  1988  . BREAST BIOPSY Right 1989   benign  . CARPAL TUNNEL RELEASE Left 1980  . CHOLECYSTECTOMY  1987  . COLONOSCOPY WITH PROPOFOL N/A 05/08/2017   Procedure: COLONOSCOPY WITH PROPOFOL;  Surgeon: Jonathon Bellows, MD;  Location: [facility] ENDOSCOPY;  Service: Gastroenterology;  Laterality: N/A;  . KNEE ARTHROPLASTY Right 2009    Social History   Tobacco Use  . Smoking status: Former Research scientist (life sciences)  . Smokeless tobacco: Former Systems developer    Quit date: 12/21/2013  Substance Use Topics  . Alcohol use: Yes    Alcohol/week: 1.2 oz    Types: 2 Standard drinks or equivalent per week    Comment: occasional  . Drug use: No     Medication list has been reviewed and updated.  Current Meds  Medication Sig  . aspirin 81 MG tablet Take 1 tablet by mouth daily.  Marland Kitchen atorvastatin (LIPITOR) 40 MG tablet TAKE 1 TABLET DAILY  . clopidogrel (PLAVIX) 75 MG tablet TAKE 1 TABLET DAILY  . fexofenadine (ALLEGRA) 180 MG tablet Take 180 mg by mouth daily.  Marland Kitchen lisinopril-hydrochlorothiazide (PRINZIDE,ZESTORETIC) 20-12.5 MG tablet TAKE 1 TABLET DAILY  . meclizine (ANTIVERT) 12.5 MG tablet TAKE 1 TABLET(12.5 MG) BY MOUTH THREE TIMES DAILY AS NEEDED FOR DIZZINESS  . metFORMIN (GLUCOPHAGE) 500 MG tablet TAKE 1 TABLET TWICE A DAY  . MULTIPLE VITAMIN PO Take by  mouth.  . sertraline (ZOLOFT) 50 MG tablet TAKE 1 TABLET DAILY    PHQ 2/9 Scores 08/04/2017 08/04/2017 06/18/2017 03/13/2017  PHQ - 2 Score 0 0 0 0  PHQ- 9 Score 0 - - 0   Functional Status Survey: Is the patient deaf or have difficulty hearing?: No Does the patient have difficulty seeing, even when wearing glasses/contacts?: No Does the patient have difficulty concentrating, remembering, or making decisions?: No Does the patient have difficulty walking or climbing stairs?: No Does the patient have difficulty dressing or bathing?: No Does the patient have difficulty doing errands alone such as visiting a doctor's office or shopping?: No   Physical Exam  Constitutional: She is oriented to person, place, and time. She appears well-developed and well-nourished. No distress.  HENT:  Head: Normocephalic and atraumatic.  Right Ear: Tympanic membrane and ear canal normal.  Left Ear: Tympanic membrane and ear canal normal.  Nose: Right sinus exhibits no maxillary sinus tenderness. Left sinus exhibits no maxillary sinus tenderness.  Mouth/Throat: Uvula is midline and oropharynx is clear and moist.  Eyes: Conjunctivae and EOM are normal. Right eye exhibits no discharge. Left eye exhibits no discharge. No scleral icterus.  Neck: Normal range of motion. Carotid bruit is not present. No erythema present. No thyromegaly present.  Cardiovascular: Normal rate, regular rhythm, normal heart sounds and normal pulses.  Pulmonary/Chest: Effort normal. No respiratory distress. She has no wheezes. Right breast exhibits no mass, no nipple discharge, no skin change and no tenderness. Left breast exhibits no mass, no nipple discharge, no skin change and no tenderness.  Abdominal: Soft. Bowel sounds are normal. There is no hepatosplenomegaly. There is no tenderness. There is no CVA tenderness.  Musculoskeletal: Normal range of motion.  Lymphadenopathy:    She has no cervical adenopathy.    She has no axillary  adenopathy.  Neurological: She is alert and oriented to person, place, and time. She has normal reflexes. No cranial nerve deficit or sensory deficit.  Skin: Skin is warm, dry and intact. No rash noted.  Psychiatric: She has a normal mood and affect. Her speech is normal and behavior is normal. Thought content normal.  Nursing note and vitals reviewed.   BP 116/64   Pulse 82   Ht 5\' 2"  (1.575 m)   Wt 179 lb (81.2 kg)   LMP  (LMP Unknown)   SpO2 97%   BMI 32.74 kg/m   Assessment and Plan: 1. Annual physical exam Normal exam - POCT urinalysis dipstick  2. Encounter for screening mammogram for breast cancer Pt to schedule  3. Essential (primary) hypertension controlled - CBC with Differential/Platelet - Comprehensive metabolic panel  4. DM (diabetes mellitus), type 2 with peripheral vascular complications (H[facility]) Doing well with diet, exercise and medication Need to schedule eye exam - TSH - Hemoglobin A1c  5. Type 2 diabetes mellitus with stage 3 chronic kidney disease, without long-term current  use of insulin ([place]) Continue to monitor renal function  6. Hyperlipidemia associated with type 2 diabetes mellitus ([place]) On statin therapy - Lipid panel  7. Major depressive disorder with single episode, in partial remission ([place]) Doing well on Zoloft  8. Ovarian failure Will schedule later this year - DG BONE DENSITY (DXA); Future   Meds ordered this encounter  Medications  . meclizine (ANTIVERT) 12.5 MG tablet    Sig: Take 1 tablet (12.5 mg total) by mouth 2 (two) times daily as needed for dizziness.    Dispense:  30 tablet    Refill:  5    Partially dictated using Editor, commissioning. Any errors are unintentional.  Halina Maidens, MD [facility]  08/04/2017

## 2017-08-04 NOTE — Patient Instructions (Signed)
Health Maintenance for Postmenopausal Women Menopause is a normal process in which your reproductive ability comes to an end. This process happens gradually over a span of months to years, usually between the ages of 22 and 9. Menopause is complete when you have missed 12 consecutive menstrual periods. It is important to talk with your health care provider about some of the most common conditions that affect postmenopausal women, such as heart disease, cancer, and bone loss (osteoporosis). Adopting a healthy lifestyle and getting preventive care can help to promote your health and wellness. Those actions can also lower your chances of developing some of these common conditions. What should I know about menopause? During menopause, you may experience a number of symptoms, such as:  Moderate-to-severe hot flashes.  Night sweats.  Decrease in sex drive.  Mood swings.  Headaches.  Tiredness.  Irritability.  Memory problems.  Insomnia.  Choosing to treat or not to treat menopausal changes is an individual decision that you make with your health care provider. What should I know about hormone replacement therapy and supplements? Hormone therapy products are effective for treating symptoms that are associated with menopause, such as hot flashes and night sweats. Hormone replacement carries certain risks, especially as you become older. If you are thinking about using estrogen or estrogen with progestin treatments, discuss the benefits and risks with your health care provider. What should I know about heart disease and stroke? Heart disease, heart attack, and stroke become more likely as you age. This may be due, in part, to the hormonal changes that your body experiences during menopause. These can affect how your body processes dietary fats, triglycerides, and cholesterol. Heart attack and stroke are both medical emergencies. There are many things that you can do to help prevent heart disease  and stroke:  Have your blood pressure checked at least every 1-2 years. High blood pressure causes heart disease and increases the risk of stroke.  If you are 53-22 years old, ask your health care provider if you should take aspirin to prevent a heart attack or a stroke.  Do not use any tobacco products, including cigarettes, chewing tobacco, or electronic cigarettes. If you need help quitting, ask your health care provider.  It is important to eat a healthy diet and maintain a healthy weight. ? Be sure to include plenty of vegetables, fruits, low-fat dairy products, and lean protein. ? Avoid eating foods that are high in solid fats, added sugars, or salt (sodium).  Get regular exercise. This is one of the most important things that you can do for your health. ? Try to exercise for at least 150 minutes each week. The type of exercise that you do should increase your heart rate and make you sweat. This is known as moderate-intensity exercise. ? Try to do strengthening exercises at least twice each week. Do these in addition to the moderate-intensity exercise.  Know your numbers.Ask your health care provider to check your cholesterol and your blood glucose. Continue to have your blood tested as directed by your health care provider.  What should I know about cancer screening? There are several types of cancer. Take the following steps to reduce your risk and to catch any cancer development as early as possible. Breast Cancer  Practice breast self-awareness. ? This means understanding how your breasts normally appear and feel. ? It also means doing regular breast self-exams. Let your health care provider know about any changes, no matter how small.  If you are 40  or older, have a clinician do a breast exam (clinical breast exam or CBE) every year. Depending on your age, family history, and medical history, it may be recommended that you also have a yearly breast X-ray (mammogram).  If you  have a family history of breast cancer, talk with your health care provider about genetic screening.  If you are at high risk for breast cancer, talk with your health care provider about having an MRI and a mammogram every year.  Breast cancer (BRCA) gene test is recommended for women who have family members with BRCA-related cancers. Results of the assessment will determine the need for genetic counseling and BRCA1 and for BRCA2 testing. BRCA-related cancers include these types: ? Breast. This occurs in males or females. ? Ovarian. ? Tubal. This may also be called fallopian tube cancer. ? Cancer of the abdominal or pelvic lining (peritoneal cancer). ? Prostate. ? Pancreatic.  Cervical, Uterine, and Ovarian Cancer Your health care provider may recommend that you be screened regularly for cancer of the pelvic organs. These include your ovaries, uterus, and vagina. This screening involves a pelvic exam, which includes checking for microscopic changes to the surface of your cervix (Pap test).  For women ages 21-65, health care providers may recommend a pelvic exam and a Pap test every three years. For women ages 79-65, they may recommend the Pap test and pelvic exam, combined with testing for human papilloma virus (HPV), every five years. Some types of HPV increase your risk of cervical cancer. Testing for HPV may also be done on women of any age who have unclear Pap test results.  Other health care providers may not recommend any screening for nonpregnant women who are considered low risk for pelvic cancer and have no symptoms. Ask your health care provider if a screening pelvic exam is right for you.  If you have had past treatment for cervical cancer or a condition that could lead to cancer, you need Pap tests and screening for cancer for at least 20 years after your treatment. If Pap tests have been discontinued for you, your risk factors (such as having a new sexual partner) need to be  reassessed to determine if you should start having screenings again. Some women have medical problems that increase the chance of getting cervical cancer. In these cases, your health care provider may recommend that you have screening and Pap tests more often.  If you have a family history of uterine cancer or ovarian cancer, talk with your health care provider about genetic screening.  If you have vaginal bleeding after reaching menopause, tell your health care provider.  There are currently no reliable tests available to screen for ovarian cancer.  Lung Cancer Lung cancer screening is recommended for adults 69-62 years old who are at high risk for lung cancer because of a history of smoking. A yearly low-dose CT scan of the lungs is recommended if you:  Currently smoke.  Have a history of at least 30 pack-years of smoking and you currently smoke or have quit within the past 15 years. A pack-year is smoking an average of one pack of cigarettes per day for one year.  Yearly screening should:  Continue until it has been 15 years since you quit.  Stop if you develop a health problem that would prevent you from having lung cancer treatment.  Colorectal Cancer  This type of cancer can be detected and can often be prevented.  Routine colorectal cancer screening usually begins at  age 70 and continues through age 42.  If you have risk factors for colon cancer, your health care provider may recommend that you be screened at an earlier age.  If you have a family history of colorectal cancer, talk with your health care provider about genetic screening.  Your health care provider may also recommend using home test kits to check for hidden blood in your stool.  A small camera at the end of a tube can be used to examine your colon directly (sigmoidoscopy or colonoscopy). This is done to check for the earliest forms of colorectal cancer.  Direct examination of the colon should be repeated every  5-10 years until age 26. However, if early forms of precancerous polyps or small growths are found or if you have a family history or genetic risk for colorectal cancer, you may need to be screened more often.  Skin Cancer  Check your skin from head to toe regularly.  Monitor any moles. Be sure to tell your health care provider: ? About any new moles or changes in moles, especially if there is a change in a mole's shape or color. ? If you have a mole that is larger than the size of a pencil eraser.  If any of your family members has a history of skin cancer, especially at a young age, talk with your health care provider about genetic screening.  Always use sunscreen. Apply sunscreen liberally and repeatedly throughout the day.  Whenever you are outside, protect yourself by wearing long sleeves, pants, a wide-brimmed hat, and sunglasses.  What should I know about osteoporosis? Osteoporosis is a condition in which bone destruction happens more quickly than new bone creation. After menopause, you may be at an increased risk for osteoporosis. To help prevent osteoporosis or the bone fractures that can happen because of osteoporosis, the following is recommended:  If you are 31-3 years old, get at least 1,000 mg of calcium and at least 600 mg of vitamin D per day.  If you are older than age 50 but younger than age 72, get at least 1,200 mg of calcium and at least 600 mg of vitamin D per day.  If you are older than age 32, get at least 1,200 mg of calcium and at least 800 mg of vitamin D per day.  Smoking and excessive alcohol intake increase the risk of osteoporosis. Eat foods that are rich in calcium and vitamin D, and do weight-bearing exercises several times each week as directed by your health care provider. What should I know about how menopause affects my mental health? Depression may occur at any age, but it is more common as you become older. Common symptoms of depression  include:  Low or sad mood.  Changes in sleep patterns.  Changes in appetite or eating patterns.  Feeling an overall lack of motivation or enjoyment of activities that you previously enjoyed.  Frequent crying spells.  Talk with your health care provider if you think that you are experiencing depression. What should I know about immunizations? It is important that you get and maintain your immunizations. These include:  Tetanus, diphtheria, and pertussis (Tdap) booster vaccine.  Influenza every year before the flu season begins.  Pneumonia vaccine.  Shingles vaccine.  Your health care provider may also recommend other immunizations. This information is not intended to replace advice given to you by your health care provider. Make sure you discuss any questions you have with your health care provider. Document Released: 03/01/2005  Document Revised: 07/28/2015 Document Reviewed: 10/11/2014 Elsevier Interactive Patient Education  [employer].

## 2017-08-05 LAB — LIPID PANEL
CHOL/HDL RATIO: 3.9 ratio (ref 0.0–4.4)
Cholesterol, Total: 209 mg/dL — ABNORMAL HIGH (ref 100–199)
HDL: 54 mg/dL (ref >39)
LDL CALC: 104 mg/dL — AB (ref 0–99)
Triglycerides: 254 mg/dL — ABNORMAL HIGH (ref 0–149)
VLDL CHOLESTEROL CAL: 51 mg/dL — AB (ref 5–40)

## 2017-08-05 LAB — HEMOGLOBIN A1C
Est. average glucose Bld gHb Est-mCnc: 148 mg/dL
Hgb A1c MFr Bld: 6.8 % — ABNORMAL HIGH (ref 4.8–5.6)

## 2017-08-05 LAB — CBC WITH DIFFERENTIAL/PLATELET
Basophils Absolute: 0.1 10*3/uL (ref 0.0–0.2)
Basos: 1 %
EOS (ABSOLUTE): 0.2 10*3/uL (ref 0.0–0.4)
EOS: 2 %
HEMATOCRIT: 35.4 % (ref 34.0–46.6)
Hemoglobin: 11.7 g/dL (ref 11.1–15.9)
IMMATURE GRANS (ABS): 0 10*3/uL (ref 0.0–0.1)
IMMATURE GRANULOCYTES: 0 %
LYMPHS: 15 %
Lymphocytes Absolute: 1.4 10*3/uL (ref 0.7–3.1)
MCH: 29.1 pg (ref 26.6–33.0)
MCHC: 33.1 g/dL (ref 31.5–35.7)
MCV: 88 fL (ref 79–97)
Monocytes Absolute: 0.7 10*3/uL (ref 0.1–0.9)
Monocytes: 8 %
NEUTROS PCT: 74 %
Neutrophils Absolute: 6.9 10*3/uL (ref 1.4–7.0)
PLATELETS: 341 10*3/uL (ref 150–450)
RBC: 4.02 x10E6/uL (ref 3.77–5.28)
RDW: 12.9 % (ref 12.3–15.4)
WBC: 9.4 10*3/uL (ref 3.4–10.8)

## 2017-08-05 LAB — TSH: TSH: 2.96 u[IU]/mL (ref 0.450–4.500)

## 2017-08-05 LAB — COMPREHENSIVE METABOLIC PANEL
A/G RATIO: 2 (ref 1.2–2.2)
ALT: 16 IU/L (ref 0–32)
AST: 16 IU/L (ref 0–40)
Albumin: 4.8 g/dL (ref 3.6–4.8)
Alkaline Phosphatase: 97 IU/L (ref 39–117)
BUN/Creatinine Ratio: 25 (ref 12–28)
BUN: 31 mg/dL — ABNORMAL HIGH (ref 8–27)
Bilirubin Total: 0.8 mg/dL (ref 0.0–1.2)
CALCIUM: 9.7 mg/dL (ref 8.7–10.3)
CO2: 19 mmol/L — ABNORMAL LOW (ref 20–29)
CREATININE: 1.22 mg/dL — AB (ref 0.57–1.00)
Chloride: 101 mmol/L (ref 96–106)
GFR, EST AFRICAN AMERICAN: 53 mL/min/{1.73_m2} — AB (ref >59)
GFR, EST NON AFRICAN AMERICAN: 46 mL/min/{1.73_m2} — AB (ref >59)
Globulin, Total: 2.4 g/dL (ref 1.5–4.5)
Glucose: 126 mg/dL — ABNORMAL HIGH (ref 65–99)
POTASSIUM: 5.2 mmol/L (ref 3.5–5.2)
Sodium: 139 mmol/L (ref 134–144)
TOTAL PROTEIN: 7.2 g/dL (ref 6.0–8.5)

## 2017-08-09 DIAGNOSIS — E119 Type 2 diabetes mellitus without complications: Secondary | ICD-10-CM | POA: Diagnosis not present

## 2017-09-16 LAB — HM DIABETES EYE EXAM

## 2017-09-19 ENCOUNTER — Encounter: Payer: Self-pay | Admitting: Internal Medicine

## 2017-11-08 DIAGNOSIS — E119 Type 2 diabetes mellitus without complications: Secondary | ICD-10-CM | POA: Diagnosis not present

## 2017-11-21 ENCOUNTER — Telehealth: Payer: Self-pay

## 2017-11-21 NOTE — Telephone Encounter (Signed)
Faxed to [facility]!

## 2017-12-04 DIAGNOSIS — R69 Illness, unspecified: Secondary | ICD-10-CM | POA: Diagnosis not present

## 2018-01-28 ENCOUNTER — Ambulatory Visit: Payer: Medicare HMO | Admitting: Internal Medicine

## 2018-01-28 ENCOUNTER — Encounter: Payer: Self-pay | Admitting: Internal Medicine

## 2018-01-28 VITALS — BP 118/72 | HR 73 | Ht 62.0 in | Wt 181.0 lb

## 2018-01-28 DIAGNOSIS — E1151 Type 2 diabetes mellitus with diabetic peripheral angiopathy without gangrene: Secondary | ICD-10-CM

## 2018-01-28 DIAGNOSIS — E782 Mixed hyperlipidemia: Secondary | ICD-10-CM | POA: Diagnosis not present

## 2018-01-28 DIAGNOSIS — Z1231 Encounter for screening mammogram for malignant neoplasm of breast: Secondary | ICD-10-CM | POA: Diagnosis not present

## 2018-01-28 DIAGNOSIS — I10 Essential (primary) hypertension: Secondary | ICD-10-CM

## 2018-01-28 MED ORDER — LISINOPRIL-HYDROCHLOROTHIAZIDE 20-12.5 MG PO TABS: 1.0000 | ORAL_TABLET | Freq: Every day | ORAL | 3 refills | Status: DC

## 2018-01-28 MED ORDER — ATORVASTATIN CALCIUM 40 MG PO TABS: 40.0000 mg | ORAL_TABLET | Freq: Every day | ORAL | 1 refills | Status: DC

## 2018-01-28 NOTE — Patient Instructions (Signed)
Stop metformin - check BS twice a day 1-2 weeks should be plenty of time to know if it is helping

## 2018-01-28 NOTE — Progress Notes (Signed)
Date:  01/28/2018   Name:  Misty Villarreal   DOB:  February 16, 1950   MRN:  [id number]   Chief Complaint: Diabetes (Follow up. )  Diabetes  She presents for her follow-up diabetic visit. She has type 2 diabetes mellitus. Her disease course has been stable. Pertinent negatives for hypoglycemia include no headaches or tremors. Pertinent negatives for diabetes include no chest pain, no fatigue, no polydipsia and no polyuria. There are no hypoglycemic complications. Symptoms are stable. Current diabetic treatment includes oral agent (monotherapy). She is compliant with treatment all of the time. Weight trend: increased a few lbs. She is following a generally healthy diet. She monitors blood glucose at home 3-4 x per week. Her breakfast blood glucose is taken between 6-7 am. Her breakfast blood glucose range is generally 130-140 mg/dl. An ACE inhibitor/angiotensin II receptor blocker is being taken. Eye exam is current.  Hypertension  This is a chronic problem. The problem is controlled. Pertinent negatives include no chest pain, headaches, palpitations or shortness of breath. Past treatments include ACE inhibitors and diuretics. The current treatment provides significant improvement.  She has had mild chronic diarrhea for years but it is much worse recently.  She has not tried holding the metformin.  She takes imodium 2-4 per day.  Lab Results  Component Value Date   HGBA1C 6.8 (H) 08/04/2017     Review of Systems  Constitutional: Negative for appetite change, fatigue, fever and unexpected weight change.  HENT: Negative for tinnitus and trouble swallowing.   Eyes: Negative for visual disturbance.  Respiratory: Negative for cough, chest tightness and shortness of breath.   Cardiovascular: Negative for chest pain, palpitations and leg swelling.  Gastrointestinal: Positive for diarrhea. Negative for abdominal pain and blood in stool.  Endocrine: Negative for polydipsia and polyuria.    Genitourinary: Negative for dysuria and hematuria.  Musculoskeletal: Negative for arthralgias.  Neurological: Negative for tremors, numbness and headaches.  Psychiatric/Behavioral: Negative for dysphoric mood.    Patient Active Problem List   Diagnosis Date Noted  . Major depressive disorder with single episode, in partial remission ([place]) 08/04/2017  . Neck pain on right side 08/05/2016  . Tinnitus of right ear 08/05/2016  . Type 2 diabetes mellitus with stage 3 chronic kidney disease, without long-term current use of insulin ([place]) 08/03/2015  . Anemia 06/24/2015  . Localized edema 06/21/2015  . History of paroxysmal supraventricular tachycardia 06/21/2015  . PAD (peripheral artery disease) ([place]) 02/02/2015  . Hyperlipidemia associated with type 2 diabetes mellitus ([place]) 09/01/2014  . Neuropathy 09/01/2014  . Phlebectasia 09/01/2014  . Abnormal glandular Papanicolaou smear of cervix 09/01/2014  . DM (diabetes mellitus), type 2 with peripheral vascular complications ([place]) 94/[id number]/9628  . Essential (primary) hypertension 09/01/2014  . Spondylolisthesis at L4-L5 level 07/05/2014  . Arthritis, degenerative 01/31/2014    Allergies  Allergen Reactions  . Clavulanic Acid Diarrhea  . Tetracycline     Other reaction(s): emesis  . Cefaclor Rash  . Cephalexin Rash  . Sulfa Antibiotics Rash    Other reaction(s): emesis    Past Surgical History:  Procedure Laterality Date  . ANGIOPLASTY / STENTING FEMORAL Left 2014, 2013  . BRAIN MENINGIOMA EXCISION  1988  . BREAST BIOPSY Right 1989   benign  . CARPAL TUNNEL RELEASE Left 1980  . CHOLECYSTECTOMY  1987  . COLONOSCOPY WITH PROPOFOL N/A 05/08/2017   Procedure: COLONOSCOPY WITH PROPOFOL;  Surgeon: Jonathon Bellows, MD;  Location: [facility] ENDOSCOPY;  Service: Gastroenterology;  Laterality: N/A;  .  KNEE ARTHROPLASTY Right 2009    Social History   Tobacco Use  . Smoking status: Former Research scientist (life sciences)  . Smokeless tobacco: Former Systems developer    Quit date:  12/21/2013  Substance Use Topics  . Alcohol use: Yes    Alcohol/week: 2.0 standard drinks    Types: 2 Standard drinks or equivalent per week    Comment: occasional  . Drug use: No     Medication list has been reviewed and updated.  Current Meds  Medication Sig  . aspirin 81 MG tablet Take 1 tablet by mouth daily.  Marland Kitchen atorvastatin (LIPITOR) 40 MG tablet TAKE 1 TABLET DAILY  . clopidogrel (PLAVIX) 75 MG tablet TAKE 1 TABLET DAILY  . fexofenadine (ALLEGRA) 180 MG tablet Take 180 mg by mouth daily.  Marland Kitchen lisinopril-hydrochlorothiazide (PRINZIDE,ZESTORETIC) 20-12.5 MG tablet TAKE 1 TABLET DAILY  . meclizine (ANTIVERT) 12.5 MG tablet Take 1 tablet (12.5 mg total) by mouth 2 (two) times daily as needed for dizziness.  . metFORMIN (GLUCOPHAGE) 500 MG tablet TAKE 1 TABLET TWICE A DAY  . MULTIPLE VITAMIN PO Take by mouth.  . sertraline (ZOLOFT) 50 MG tablet TAKE 1 TABLET DAILY    PHQ 2/9 Scores 01/28/2018 01/28/2018 08/04/2017 08/04/2017  PHQ - 2 Score 0 0 0 0  PHQ- 9 Score 0 - 0 -    Physical Exam Vitals signs and nursing note reviewed.  Constitutional:      General: She is not in acute distress.    Appearance: She is well-developed.  HENT:     Head: Normocephalic and atraumatic.  Eyes:     Pupils: Pupils are equal, round, and reactive to light.  Neck:     Musculoskeletal: Normal range of motion and neck supple.  Cardiovascular:     Rate and Rhythm: Normal rate and regular rhythm.     Pulses: Normal pulses.     Heart sounds: Normal heart sounds.  Pulmonary:     Effort: Pulmonary effort is normal. No respiratory distress.  Abdominal:     Tenderness: There is no abdominal tenderness.  Musculoskeletal: Normal range of motion.  Skin:    General: Skin is warm and dry.     Findings: No rash.  Neurological:     Mental Status: She is alert and oriented to person, place, and time.  Psychiatric:        Behavior: Behavior normal.        Thought Content: Thought content normal.     BP  118/72 (BP Location: Right Arm, Patient Position: Sitting, Cuff Size: Normal)   Pulse 73   Ht 5\' 2"  (1.575 m)   Wt 181 lb (82.1 kg)   LMP  (LMP Unknown)   SpO2 97%   BMI 33.11 kg/m   Assessment and Plan: 1. DM (diabetes mellitus), type 2 with peripheral vascular complications (HCC) Hold metformin 1-2 weeks then call with results; may need to change therapy - Basic metabolic panel - Hemoglobin A1c  2. Mixed hyperlipidemia Continue statin therapy - atorvastatin (LIPITOR) 40 MG tablet; Take 1 tablet (40 mg total) by mouth daily.  Dispense: 90 tablet; Refill: 1  3. Essential (primary) hypertension controlled - lisinopril-hydrochlorothiazide (PRINZIDE,ZESTORETIC) 20-12.5 MG tablet; Take 1 tablet by mouth daily.  Dispense: 90 tablet; Refill: 3  4. Encounter for screening mammogram for breast cancer Pt encouraged to schedule at [place]; Future   Partially dictated using Editor, commissioning. Any errors are unintentional.  Halina Maidens, MD [facility]  01/28/2018

## 2018-01-29 ENCOUNTER — Encounter (INDEPENDENT_AMBULATORY_CARE_PROVIDER_SITE_OTHER): Payer: Self-pay | Admitting: Vascular Surgery

## 2018-01-29 ENCOUNTER — Ambulatory Visit (INDEPENDENT_AMBULATORY_CARE_PROVIDER_SITE_OTHER): Payer: Medicare HMO

## 2018-01-29 ENCOUNTER — Ambulatory Visit (INDEPENDENT_AMBULATORY_CARE_PROVIDER_SITE_OTHER): Payer: Medicare HMO | Admitting: Vascular Surgery

## 2018-01-29 VITALS — BP 124/74 | HR 65 | Resp 14 | Ht 62.0 in | Wt 181.2 lb

## 2018-01-29 DIAGNOSIS — I739 Peripheral vascular disease, unspecified: Secondary | ICD-10-CM

## 2018-01-29 DIAGNOSIS — I129 Hypertensive chronic kidney disease with stage 1 through stage 4 chronic kidney disease, or unspecified chronic kidney disease: Secondary | ICD-10-CM

## 2018-01-29 DIAGNOSIS — E785 Hyperlipidemia, unspecified: Secondary | ICD-10-CM

## 2018-01-29 DIAGNOSIS — I10 Essential (primary) hypertension: Secondary | ICD-10-CM

## 2018-01-29 DIAGNOSIS — E1169 Type 2 diabetes mellitus with other specified complication: Secondary | ICD-10-CM

## 2018-01-29 DIAGNOSIS — E1122 Type 2 diabetes mellitus with diabetic chronic kidney disease: Secondary | ICD-10-CM | POA: Diagnosis not present

## 2018-01-29 DIAGNOSIS — N183 Chronic kidney disease, stage 3 (moderate): Secondary | ICD-10-CM

## 2018-01-29 LAB — BASIC METABOLIC PANEL
BUN / CREAT RATIO: 21 (ref 12–28)
BUN: 28 mg/dL — ABNORMAL HIGH (ref 8–27)
CO2: 20 mmol/L (ref 20–29)
Calcium: 9.6 mg/dL (ref 8.7–10.3)
Chloride: 101 mmol/L (ref 96–106)
Creatinine, Ser: 1.31 mg/dL — ABNORMAL HIGH (ref 0.57–1.00)
GFR, EST AFRICAN AMERICAN: 48 mL/min/{1.73_m2} — AB (ref >59)
GFR, EST NON AFRICAN AMERICAN: 42 mL/min/{1.73_m2} — AB (ref >59)
Glucose: 128 mg/dL — ABNORMAL HIGH (ref 65–99)
POTASSIUM: 5.8 mmol/L — AB (ref 3.5–5.2)
SODIUM: 136 mmol/L (ref 134–144)

## 2018-01-29 LAB — HEMOGLOBIN A1C
Est. average glucose Bld gHb Est-mCnc: 151 mg/dL
Hgb A1c MFr Bld: 6.9 % — ABNORMAL HIGH (ref 4.8–5.6)

## 2018-01-29 NOTE — Progress Notes (Signed)
MRN : [id number]  Misty Villarreal is a 68 y.o. (September 17, 1950) female who presents with chief complaint of  Chief Complaint  Patient presents with  . Follow-up  .  History of Present Illness:    The patient returns to the office for followup and review of the noninvasive studies. There have been no interval changes in lower extremity symptoms. No interval shortening of the patient's claudication distance or development of rest pain symptoms. No new ulcers or wounds have occurred since the last visit.  There have been no significant changes to the patient's overall health care.  The patient denies amaurosis fugax or recent TIA symptoms. There are no recent neurological changes noted. The patient denies history of DVT, PE or superficial thrombophlebitis. The patient denies recent episodes of angina or shortness of breath.   ABI's Rt= 0.84 and Lt=0.64 (previous ABI Rt=1.22 and Lt=0.95)   Current Meds  Medication Sig  . aspirin 81 MG tablet Take 1 tablet by mouth daily.  Marland Kitchen atorvastatin (LIPITOR) 40 MG tablet Take 1 tablet (40 mg total) by mouth daily.  . clopidogrel (PLAVIX) 75 MG tablet TAKE 1 TABLET DAILY  . fexofenadine (ALLEGRA) 180 MG tablet Take 180 mg by mouth daily.  Marland Kitchen lisinopril-hydrochlorothiazide (PRINZIDE,ZESTORETIC) 20-12.5 MG tablet Take 1 tablet by mouth daily.  . meclizine (ANTIVERT) 12.5 MG tablet Take 1 tablet (12.5 mg total) by mouth 2 (two) times daily as needed for dizziness.  . metFORMIN (GLUCOPHAGE) 500 MG tablet TAKE 1 TABLET TWICE A DAY  . MULTIPLE VITAMIN PO Take by mouth.  . sertraline (ZOLOFT) 50 MG tablet TAKE 1 TABLET DAILY    Past Medical History:  Diagnosis Date  . Abnormal glandular Papanicolaou smear of cervix 09/01/2014   Normal Pap 2012   . Chronic kidney disease   . Diabetes mellitus without complication ([place])   . Hyperlipidemia   . Slurred speech 12/03/2016    Past Surgical History:  Procedure Laterality Date  . ANGIOPLASTY /  STENTING FEMORAL Left 2014, 2013  . BRAIN MENINGIOMA EXCISION  1988  . BREAST BIOPSY Right 1989   benign  . CARPAL TUNNEL RELEASE Left 1980  . CHOLECYSTECTOMY  1987  . COLONOSCOPY WITH PROPOFOL N/A 05/08/2017   Procedure: COLONOSCOPY WITH PROPOFOL;  Surgeon: Jonathon Bellows, MD;  Location: [facility] ENDOSCOPY;  Service: Gastroenterology;  Laterality: N/A;  . KNEE ARTHROPLASTY Right 2009    Social History Social History   Tobacco Use  . Smoking status: Former Research scientist (life sciences)  . Smokeless tobacco: Former Systems developer    Quit date: 12/21/2013  Substance Use Topics  . Alcohol use: Yes    Alcohol/week: 2.0 standard drinks    Types: 2 Standard drinks or equivalent per week    Comment: occasional  . Drug use: No    Family History Family History  Problem Relation Age of Onset  . Diabetes Mother   . Heart failure Father   . Diabetes Father     Allergies  Allergen Reactions  . Clavulanic Acid Diarrhea  . Tetracycline     Other reaction(s): emesis  . Cefaclor Rash  . Cephalexin Rash  . Sulfa Antibiotics Rash    Other reaction(s): emesis     REVIEW OF SYSTEMS (Negative unless checked)  Constitutional: [] Weight loss  [] Fever  [] Chills Cardiac: [] Chest pain   [] Chest pressure   [] Palpitations   [] Shortness of breath when laying flat   [] Shortness of breath with exertion. Vascular:  [] Pain in legs with walking   [] Pain in legs  at rest  [] History of DVT   [] Phlebitis   [] Swelling in legs   [] Varicose veins   [] Non-healing ulcers Pulmonary:   [] Uses home oxygen   [] Productive cough   [] Hemoptysis   [] Wheeze  [] COPD   [] Asthma Neurologic:  [] Dizziness   [] Seizures   [] History of stroke   [] History of TIA  [] Aphasia   [] Vissual changes   [] Weakness or numbness in arm   [x] Weakness or numbness in leg Musculoskeletal:   [] Joint swelling   [x] Joint pain   [] Low back pain Hematologic:  [] Easy bruising  [] Easy bleeding   [] Hypercoagulable state   [] Anemic Gastrointestinal:  [] Diarrhea   [] Vomiting   [] Gastroesophageal reflux/heartburn   [] Difficulty swallowing. Genitourinary:  [] Chronic kidney disease   [] Difficult urination  [] Frequent urination   [] Blood in urine Skin:  [] Rashes   [] Ulcers  Psychological:  [] History of anxiety   []  History of major depression.  Physical Examination  Vitals:   01/29/18 1343  BP: 124/74  Pulse: 65  Resp: 14  Weight: 181 lb 3.2 oz (82.2 kg)  Height: 5\' 2"  (1.575 m)   Body mass index is 33.14 kg/m. Gen: WD/WN, NAD Head: [place]/AT, No temporalis wasting.  Ear/Nose/Throat: Hearing grossly intact, nares w/o erythema or drainage Eyes: PER, EOMI, sclera nonicteric.  Neck: Supple, no large masses.   Pulmonary:  Good air movement, no audible wheezing bilaterally, no use of accessory muscles.  Cardiac: RRR, no JVD Vascular:  Vessel Right Left  Radial Palpable Palpable  PT Not Palpable Not Palpable  DP Not Palpable Not Palpable  Gastrointestinal: Non-distended. No guarding/no peritoneal signs.  Musculoskeletal: M/S 5/5 throughout.  No deformity or atrophy.  Neurologic: CN 2-12 intact. Symmetrical.  Speech is fluent. Motor exam as listed above. Psychiatric: Judgment intact, Mood & affect appropriate for pt's clinical situation. Dermatologic: No rashes or ulcers noted.  No changes consistent with cellulitis. Lymph : No lichenification or skin changes of chronic lymphedema.  CBC Lab Results  Component Value Date   WBC 9.4 08/04/2017   HGB 11.7 08/04/2017   HCT 35.4 08/04/2017   MCV 88 08/04/2017   PLT 341 08/04/2017    BMET    Component Value Date/Time   NA 136 01/28/2018 1107   NA 139 04/27/2013 0713   K 5.8 (H) 01/28/2018 1107   K 4.2 04/27/2013 0713   CL 101 01/28/2018 1107   CL 108 (H) 04/27/2013 0713   CO2 20 01/28/2018 1107   CO2 25 04/27/2013 0713   GLUCOSE 128 (H) 01/28/2018 1107   GLUCOSE 146 (H) 12/03/2016 1625   GLUCOSE 137 (H) 04/27/2013 0713   BUN 28 (H) 01/28/2018 1107   BUN 21 (H) 04/27/2013 0713   CREATININE 1.31 (H)  01/28/2018 1107   CREATININE 1.28 04/27/2013 0713   CALCIUM 9.6 01/28/2018 1107   CALCIUM 9.3 04/27/2013 0713   GFRNONAA 42 (L) 01/28/2018 1107   GFRNONAA 44 (L) 04/27/2013 0713   GFRAA 48 (L) 01/28/2018 1107   GFRAA 52 (L) 04/27/2013 0713   Estimated Creatinine Clearance: 40.8 mL/min (A) (by C-G formula based on SCr of 1.31 mg/dL (H)).  COAG Lab Results  Component Value Date   INR 0.85 12/03/2016    Radiology No results found.   Assessment/Plan 1. PAD (peripheral artery disease) (HCC)  Recommend:  The patient has evidence of atherosclerosis of the lower extremities with claudication.  The patient does not voice lifestyle limiting changes at this point in time.  However, her noninvasive studies  suggest clinically significant  change.  No invasive studies, angiography or surgery at this time, but given the deterioration of her ABI's I will see her back in 6 months rather than one year.  The patient should continue walking and begin a more formal exercise program.  The patient should continue antiplatelet therapy and aggressive treatment of the lipid abnormalities  No changes in the patient's medications at this time  The patient should continue wearing graduated compression socks 10-15 mmHg strength to control the mild edema.   - VAS [place] ABI WITH/WO TBI; Future  2. Type 2 diabetes mellitus with stage 3 chronic kidney disease, without long-term current use of insulin (HCC) Continue hypoglycemic medications as already ordered, these medications have been reviewed and there are no changes at this time.  Hgb A1C to be monitored as already arranged by primary service   3. Hyperlipidemia associated with type 2 diabetes mellitus ([place]) Continue statin as ordered and reviewed, no changes at this time   4. Essential (primary) hypertension Continue antihypertensive medications as already ordered, these medications have been reviewed and there are no changes at this  time.     Hortencia Pilar, MD  01/29/2018 2:07 PM

## 2018-02-07 DIAGNOSIS — E119 Type 2 diabetes mellitus without complications: Secondary | ICD-10-CM | POA: Diagnosis not present

## 2018-02-16 ENCOUNTER — Encounter: Payer: Self-pay | Admitting: Internal Medicine

## 2018-02-16 ENCOUNTER — Ambulatory Visit
Admission: RE | Admit: 2018-02-16 | Discharge: 2018-02-16 | Disposition: A | Discharge status: Home / Self Care | Payer: Medicare HMO | Source: Ambulatory Visit | Attending: Internal Medicine | Admitting: Internal Medicine

## 2018-02-16 DIAGNOSIS — M8580 Other specified disorders of bone density and structure, unspecified site: Secondary | ICD-10-CM | POA: Insufficient documentation

## 2018-02-16 DIAGNOSIS — M85851 Other specified disorders of bone density and structure, right thigh: Secondary | ICD-10-CM | POA: Insufficient documentation

## 2018-02-16 DIAGNOSIS — Z1231 Encounter for screening mammogram for malignant neoplasm of breast: Secondary | ICD-10-CM

## 2018-02-16 DIAGNOSIS — Z780 Asymptomatic menopausal state: Secondary | ICD-10-CM | POA: Diagnosis not present

## 2018-02-16 DIAGNOSIS — E2839 Other primary ovarian failure: Secondary | ICD-10-CM | POA: Insufficient documentation

## 2018-03-18 ENCOUNTER — Other Ambulatory Visit (INDEPENDENT_AMBULATORY_CARE_PROVIDER_SITE_OTHER): Payer: Self-pay | Admitting: Vascular Surgery

## 2018-04-06 ENCOUNTER — Ambulatory Visit
Admission: EM | Admit: 2018-04-06 | Discharge: 2018-04-06 | Disposition: A | Discharge status: Home / Self Care | Payer: Medicare HMO | Attending: Emergency Medicine | Admitting: Emergency Medicine

## 2018-04-06 ENCOUNTER — Other Ambulatory Visit: Payer: Self-pay

## 2018-04-06 ENCOUNTER — Encounter: Payer: Self-pay | Admitting: Emergency Medicine

## 2018-04-06 DIAGNOSIS — T1502XA Foreign body in cornea, left eye, initial encounter: Secondary | ICD-10-CM

## 2018-04-06 DIAGNOSIS — Z77098 Contact with and (suspected) exposure to other hazardous, chiefly nonmedicinal, chemicals: Secondary | ICD-10-CM

## 2018-04-06 NOTE — ED Provider Notes (Signed)
HPI  SUBJECTIVE:  Misty Villarreal is a 68 y.o. female who presents with left eye pain described as burning, constant irritation, and a gritty feeling.  She states her symptoms started approximately 30 minutes after she washed out a "hot shot pest control" insecticide bottle.  Thinks that she may have touched her eye with her hand.  She denies a direct splash.  This occurred at 1 to 2 hours prior to arrival.  She reports blurry vision initially, but this has resolved.  She reports increased tearing.  No double vision, photophobia, headache, she does not wear contacts.  She wears glasses.  She tried flushing her eye out with saline eyewash, distilled water, cool compresses, Systane.  Flushing the eye out with saline helped, distilled water made it worse.  Has medical history of diabetes, hypertension, chronic kidney disease, peripheral vascular disease.  Tetanus is up-to-date.  PMD: Dr. Army Melia.  Ophthalmology [facility].    Past Medical History:  Diagnosis Date  . Abnormal glandular Papanicolaou smear of cervix 09/01/2014   Normal Pap 2012   . Chronic kidney disease   . Diabetes mellitus without complication ([place])   . Hyperlipidemia   . Slurred speech 12/03/2016    Past Surgical History:  Procedure Laterality Date  . ANGIOPLASTY / STENTING FEMORAL Left 2014, 2013  . BRAIN MENINGIOMA EXCISION  1988  . BREAST BIOPSY Right 1989   benign  . CARPAL TUNNEL RELEASE Left 1980  . CHOLECYSTECTOMY  1987  . COLONOSCOPY WITH PROPOFOL N/A 05/08/2017   Procedure: COLONOSCOPY WITH PROPOFOL;  Surgeon: Jonathon Bellows, MD;  Location: [facility] ENDOSCOPY;  Service: Gastroenterology;  Laterality: N/A;  . KNEE ARTHROPLASTY Right 2009    Family History  Problem Relation Age of Onset  . Diabetes Mother   . Heart failure Father   . Diabetes Father   . Breast cancer Neg Hx     Social History   Tobacco Use  . Smoking status: Former Smoker    Last attempt to quit: 04/05/2013    Years since quitting:  5.0  . Smokeless tobacco: Never Used  Substance Use Topics  . Alcohol use: Yes    Alcohol/week: 2.0 standard drinks    Types: 2 Standard drinks or equivalent per week    Comment: occasional  . Drug use: No    No current facility-administered medications for this encounter.   Current Outpatient Medications:  .  aspirin 81 MG tablet, Take 1 tablet by mouth daily., Disp: , Rfl:  .  atorvastatin (LIPITOR) 40 MG tablet, Take 1 tablet (40 mg total) by mouth daily., Disp: 90 tablet, Rfl: 1 .  clopidogrel (PLAVIX) 75 MG tablet, TAKE 1 TABLET DAILY, Disp: 90 tablet, Rfl: 4 .  fexofenadine (ALLEGRA) 180 MG tablet, Take 180 mg by mouth daily., Disp: , Rfl:  .  lisinopril-hydrochlorothiazide (PRINZIDE,ZESTORETIC) 20-12.5 MG tablet, Take 1 tablet by mouth daily., Disp: 90 tablet, Rfl: 3 .  meclizine (ANTIVERT) 12.5 MG tablet, Take 1 tablet (12.5 mg total) by mouth 2 (two) times daily as needed for dizziness., Disp: 30 tablet, Rfl: 5 .  metFORMIN (GLUCOPHAGE) 500 MG tablet, TAKE 1 TABLET TWICE A DAY, Disp: 180 tablet, Rfl: 3 .  MULTIPLE VITAMIN PO, Take by mouth., Disp: , Rfl:  .  sertraline (ZOLOFT) 50 MG tablet, TAKE 1 TABLET DAILY, Disp: 90 tablet, Rfl: 3  Allergies  Allergen Reactions  . Augmentin [Amoxicillin-Pot Clavulanate] Diarrhea  . Tetracycline     Other reaction(s): emesis  . Cefaclor Rash  .  Cephalexin Rash  . Sulfa Antibiotics Rash    Other reaction(s): emesis     ROS  As noted in HPI.   Physical Exam  BP (!) 158/66 (BP Location: Left Arm)   Pulse 78   Temp 98 F (36.7 C) (Oral)   Resp 16   Ht 5\' 2"  (1.575 m)   Wt 79.8 kg   LMP  (LMP Unknown)   SpO2 100%   BMI 32.19 kg/m   Constitutional: Well developed, well nourished, no acute distress Eyes: PERRLA, EOMI, mild left-sided conjunctival injection.  No periorbital erythema, edema.  No drainage.  Positive increased tearing.  No direct or consensual photophobia.  No concretions seen on lid eversion.  No foreign body  seen in the cornea.  pH 6.5.    Visual Acuity  Right Eye Distance: 20/30(corrected) Left Eye Distance: 20/40(corrected) Bilateral Distance: 20/25(corrected)  Right Eye Near:   Left Eye Near:    Bilateral Near:    HENT: Normocephalic, atraumatic,mucus membranes moist Respiratory: Normal inspiratory effort Cardiovascular: Normal rate GI: nondistended skin: No rash, skin intact Musculoskeletal: no deformities Neurologic: Alert & oriented x 3, no focal neuro deficits Psychiatric: Speech and behavior appropriate   ED Course   Medications - No data to display  No orders of the defined types were placed in this encounter.   No results found for this or any previous visit (from the past 24 hour(s)). No results found.  ED Clinical Impression  Chemical exposure of eye   ED Assessment/Plan  Initial pH 6.5.  Patient states her left eye is a little weaker than the right eye at baseline.  Procedure note: Anesthetized eye with tetracaine.  Then inserted Morgan lens and irrigated the eye out with 500 cc of normal saline.  Recheck pH 7.4.  Patient tolerated procedure well.  There was no corneal abrasion post irrigation.  Systane eyedrops or ointments as needed.  Follow-up with ophthalmologist as needed.  To the ER if she gets worse.  No orders of the defined types were placed in this encounter.   *This clinic note was created using Dragon dictation software. Therefore, there may be occasional mistakes despite careful proofreading.   ?   Melynda Ripple, MD 04/07/18 [phone]

## 2018-04-06 NOTE — ED Triage Notes (Signed)
Patient in today c/o left eye pain, irritation and burning ~30 minutes after mixing up bug spray this morning. Patient has flushed her eye saline and used lubricating eye drops without relief.

## 2018-04-06 NOTE — Discharge Instructions (Addendum)
We have irrigated your eye until it returned to a normal pH, but it may still be irritated due to the large amount of fluid that we used.  I would use some Systane ointment or drops as needed.  Follow-up with your eye doctor if you are not getting better in a day or 2.  Go to the ER for headache, visual changes, fevers above 100.4, or for any other concerns.

## 2018-04-27 ENCOUNTER — Encounter: Payer: Self-pay | Admitting: Internal Medicine

## 2018-04-28 ENCOUNTER — Other Ambulatory Visit: Payer: Self-pay

## 2018-04-28 ENCOUNTER — Encounter: Payer: Self-pay | Admitting: Internal Medicine

## 2018-04-28 ENCOUNTER — Other Ambulatory Visit: Payer: Self-pay | Admitting: Internal Medicine

## 2018-04-28 ENCOUNTER — Ambulatory Visit (INDEPENDENT_AMBULATORY_CARE_PROVIDER_SITE_OTHER): Payer: Medicare HMO | Admitting: Internal Medicine

## 2018-04-28 VITALS — Ht 62.0 in | Wt 176.0 lb

## 2018-04-28 DIAGNOSIS — G2581 Restless legs syndrome: Secondary | ICD-10-CM

## 2018-04-28 DIAGNOSIS — I10 Essential (primary) hypertension: Secondary | ICD-10-CM

## 2018-04-28 DIAGNOSIS — G4762 Sleep related leg cramps: Secondary | ICD-10-CM

## 2018-04-28 MED ORDER — ROPINIROLE HCL 0.5 MG PO TABS: 0.5000 mg | ORAL_TABLET | Freq: Every day | ORAL | 0 refills | Status: DC

## 2018-04-28 MED ORDER — LISINOPRIL 20 MG PO TABS: 20.0000 mg | ORAL_TABLET | Freq: Every day | ORAL | 3 refills | Status: DC

## 2018-04-28 NOTE — Progress Notes (Signed)
Date:  04/28/2018   Name:  Misty Villarreal   DOB:  11/03/1950   MRN:  [id number]  I connected with this patient, Misty Villarreal, by telephone at the patient's home.  I verified that I am speaking with the correct person using two identifiers. This visit was conducted via telephone due to the Covid-19 outbreak from my office at [facility] in [place], [place]. I discussed the limitations, risks, security and privacy concerns of performing an evaluation and management service by telephone. I also discussed with the patient that there may be a patient responsible charge related to this service. The patient expressed understanding and agreed to proceed.  PTs message: I have been experiencing almost constant muscle cramping in my legs, feet and hands. I have been taking mustard and pickle juice. I have also been taking Tumeric 500mg , Potassium Gluconate 595 mg and Magnesium 250mg  twice a day. I am drinking excessive amounts of water to try to stay hydrated.  Is there anything else I can do because the pain is almost unbearable at times. It seems like one cramp will let up and there will be another one somewhere else. I am constantly being woke up and sometimes they will make me cry from the pain.  I also have been experiencing Restless leg syndrome. Could you please call in a script for Requip to [place]. I took it before.   Chief Complaint: Leg Pain (Had all her life. Constant for the last month. Used to be mostly at night, now its anytime. Happens all over body. Hands, legs. Tried mustard, vinager, and drinking lots of water. No relief. Had RLS a few years ago. Can they relate?)  Hypertension  This is a chronic problem. The problem is controlled. Pertinent negatives include no chest pain, headaches, palpitations or shortness of breath. Past treatments include ACE inhibitors and diuretics. The current treatment provides significant improvement.    Restless Leg sx - started years  ago and took requip.  After a few years the sx improved so she stopped the medication due to cost.  Now the RLS sx have returned.  It is in her legs and in the evening. Sometimes she thinks it is also in her arms.  Muscle cramps - having severe cramps every day, in hands, legs, feet lasting from 2-15 minutes.  She tried stretching, mustard and pickle juice. The cramps seem to triggered by movement.  She has also been taking magnesium and potassium otc.  She is drinking lots of plain water with ne benefit.    Review of Systems  Constitutional: Negative for chills, fatigue and fever.  Respiratory: Negative for shortness of breath.   Cardiovascular: Negative for chest pain, palpitations and leg swelling.  Musculoskeletal: Positive for myalgias. Negative for arthralgias, gait problem and joint swelling.  Neurological: Positive for dizziness. Negative for numbness and headaches.  Hematological: Negative for adenopathy.  Psychiatric/Behavioral: Negative for dysphoric mood. The patient is not nervous/anxious.     Patient Active Problem List   Diagnosis Date Noted  . Osteopenia determined by x-ray 02/16/2018  . Major depressive disorder with single episode, in partial remission ([place]) 08/04/2017  . Neck pain on right side 08/05/2016  . Tinnitus of right ear 08/05/2016  . Type 2 diabetes mellitus with stage 3 chronic kidney disease, without long-term current use of insulin ([place]) 08/03/2015  . Anemia 06/24/2015  . Localized edema 06/21/2015  . History of paroxysmal supraventricular tachycardia 06/21/2015  . PAD (peripheral artery disease) ([place])  02/02/2015  . Hyperlipidemia associated with type 2 diabetes mellitus ([place]) 09/01/2014  . Neuropathy 09/01/2014  . Phlebectasia 09/01/2014  . DM (diabetes mellitus), type 2 with peripheral vascular complications ([place]) 42/59/5638  . Essential (primary) hypertension 09/01/2014  . Spondylolisthesis at L4-L5 level 07/05/2014  . Arthritis, degenerative  01/31/2014    Allergies  Allergen Reactions  . Augmentin [Amoxicillin-Pot Clavulanate] Diarrhea  . Tetracycline     Other reaction(s): emesis  . Cefaclor Rash  . Cephalexin Rash  . Sulfa Antibiotics Rash    Other reaction(s): emesis    Past Surgical History:  Procedure Laterality Date  . ANGIOPLASTY / STENTING FEMORAL Left 2014, 2013  . BRAIN MENINGIOMA EXCISION  1988  . BREAST BIOPSY Right 1989   benign  . CARPAL TUNNEL RELEASE Left 1980  . CHOLECYSTECTOMY  1987  . COLONOSCOPY WITH PROPOFOL N/A 05/08/2017   Procedure: COLONOSCOPY WITH PROPOFOL;  Surgeon: Jonathon Bellows, MD;  Location: [facility] ENDOSCOPY;  Service: Gastroenterology;  Laterality: N/A;  . KNEE ARTHROPLASTY Right 2009    Social History   Tobacco Use  . Smoking status: Former Smoker    Last attempt to quit: 04/05/2013    Years since quitting: 5.0  . Smokeless tobacco: Never Used  Substance Use Topics  . Alcohol use: Yes    Alcohol/week: 2.0 standard drinks    Types: 2 Standard drinks or equivalent per week    Comment: occasional  . Drug use: No     Medication list has been reviewed and updated.  Current Meds  Medication Sig  . aspirin 81 MG tablet Take 1 tablet by mouth daily.  Marland Kitchen atorvastatin (LIPITOR) 40 MG tablet Take 1 tablet (40 mg total) by mouth daily.  . clopidogrel (PLAVIX) 75 MG tablet TAKE 1 TABLET DAILY  . fexofenadine (ALLEGRA) 180 MG tablet Take 180 mg by mouth daily.  Marland Kitchen lisinopril-hydrochlorothiazide (PRINZIDE,ZESTORETIC) 20-12.5 MG tablet Take 1 tablet by mouth daily.  . meclizine (ANTIVERT) 12.5 MG tablet Take 1 tablet (12.5 mg total) by mouth 2 (two) times daily as needed for dizziness.  . metFORMIN (GLUCOPHAGE) 500 MG tablet TAKE 1 TABLET TWICE A DAY  . MULTIPLE VITAMIN PO Take by mouth.  . sertraline (ZOLOFT) 50 MG tablet TAKE 1 TABLET DAILY    PHQ 2/9 Scores 04/28/2018 01/28/2018 01/28/2018 08/04/2017  PHQ - 2 Score 0 0 0 0  PHQ- 9 Score - 0 - 0    BP Readings from Last 3 Encounters:   04/06/18 (!) 158/66  01/29/18 124/74  01/28/18 118/72    Physical Exam Pulmonary:     Comments: No cough or apparent dyspnea during the call. Neurological:     Mental Status: She is alert.  Psychiatric:        Attention and Perception: Attention normal.        Mood and Affect: Mood normal.        Speech: Speech normal.        Cognition and Memory: Cognition normal.     Wt Readings from Last 3 Encounters:  04/28/18 176 lb (79.8 kg)  04/06/18 176 lb (79.8 kg)  01/29/18 181 lb 3.2 oz (82.2 kg)    Ht 5\' 2"  (1.575 m)   Wt 176 lb (79.8 kg)   LMP  (LMP Unknown)   BMI 32.19 kg/m   Assessment and Plan: 1. Nocturnal leg cramps Add Co Q-10 and electrolyte water/low calorie gatorade in place of some of the plain water Continue magnesium but stop potassium Lab Results  Component Value  Date   CREATININE 1.31 (H) 01/28/2018   BUN 28 (H) 01/28/2018   NA 136 01/28/2018   K 5.8 (H) 01/28/2018   CL 101 01/28/2018   CO2 20 01/28/2018   2. Restless leg syndrome Start with 0.5 mg at HS; call in 2 weeks if higher dose is needed - rOPINIRole (REQUIP) 0.5 MG tablet; Take 1 tablet (0.5 mg total) by mouth at bedtime.  Dispense: 30 tablet; Refill: 0  3. Essential (primary) hypertension Stop HCTZ which may be contributing to cramps May take PRN lasix  - lisinopril (PRINIVIL,ZESTRIL) 20 MG tablet; Take 1 tablet (20 mg total) by mouth daily.  Dispense: 90 tablet; Refill: 3  I spent 15 minutes on this encounter. Partially dictated using Editor, commissioning. Any errors are unintentional.  Halina Maidens, MD [facility]  04/28/2018.

## 2018-05-09 DIAGNOSIS — E119 Type 2 diabetes mellitus without complications: Secondary | ICD-10-CM | POA: Diagnosis not present

## 2018-05-15 ENCOUNTER — Encounter: Payer: Self-pay | Admitting: Internal Medicine

## 2018-05-16 NOTE — Telephone Encounter (Signed)
Patient said medication given on phone visit is not helping.  Please advise.,

## 2018-05-19 ENCOUNTER — Other Ambulatory Visit: Payer: Self-pay | Admitting: Internal Medicine

## 2018-05-19 DIAGNOSIS — G2581 Restless legs syndrome: Secondary | ICD-10-CM

## 2018-05-19 MED ORDER — ROPINIROLE HCL 0.5 MG PO TABS: 0.5000 mg | ORAL_TABLET | Freq: Every day | ORAL | 0 refills | Status: DC

## 2018-05-19 NOTE — Telephone Encounter (Signed)
Please advise patient response.

## 2018-07-13 ENCOUNTER — Telehealth (INDEPENDENT_AMBULATORY_CARE_PROVIDER_SITE_OTHER): Payer: Self-pay | Admitting: Vascular Surgery

## 2018-07-13 NOTE — Telephone Encounter (Signed)
Can you please call and schedule as advised per Arna Medici. AS, CMA

## 2018-07-13 NOTE — Telephone Encounter (Signed)
Patient called back and spoke with Tammy. Tammy advised patient that she has apt with our office 07/30/18.   I called patient back again. She has no ulcers, no drainage, no redness or hot to touch, no discoloration. Does patient need to be seen sooner?

## 2018-07-13 NOTE — Telephone Encounter (Signed)
Attempted to call patient with no answer. Unable to leave message due to patient mailbox being full. AS, CMA

## 2018-07-13 NOTE — Telephone Encounter (Signed)
Yea we can bring her in soon especially if she's having issues. These sound a lot like worsening claudication.  Let's get ABIs and Bil Art Duplex as well.  This can be done at her convenience as soon as we have space

## 2018-07-14 ENCOUNTER — Encounter (INDEPENDENT_AMBULATORY_CARE_PROVIDER_SITE_OTHER): Payer: Self-pay | Admitting: Nurse Practitioner

## 2018-07-14 ENCOUNTER — Other Ambulatory Visit: Payer: Self-pay

## 2018-07-14 ENCOUNTER — Other Ambulatory Visit (INDEPENDENT_AMBULATORY_CARE_PROVIDER_SITE_OTHER): Payer: Self-pay | Admitting: Nurse Practitioner

## 2018-07-14 ENCOUNTER — Ambulatory Visit (INDEPENDENT_AMBULATORY_CARE_PROVIDER_SITE_OTHER): Payer: Medicare HMO

## 2018-07-14 ENCOUNTER — Ambulatory Visit (INDEPENDENT_AMBULATORY_CARE_PROVIDER_SITE_OTHER): Payer: Medicare HMO | Admitting: Nurse Practitioner

## 2018-07-14 VITALS — BP 177/80 | HR 76 | Resp 14 | Ht 62.0 in | Wt 171.0 lb

## 2018-07-14 DIAGNOSIS — E1169 Type 2 diabetes mellitus with other specified complication: Secondary | ICD-10-CM

## 2018-07-14 DIAGNOSIS — I739 Peripheral vascular disease, unspecified: Secondary | ICD-10-CM

## 2018-07-14 DIAGNOSIS — Z79899 Other long term (current) drug therapy: Secondary | ICD-10-CM

## 2018-07-14 DIAGNOSIS — M79605 Pain in left leg: Secondary | ICD-10-CM | POA: Diagnosis not present

## 2018-07-14 DIAGNOSIS — E1122 Type 2 diabetes mellitus with diabetic chronic kidney disease: Secondary | ICD-10-CM | POA: Diagnosis not present

## 2018-07-14 DIAGNOSIS — Z7984 Long term (current) use of oral hypoglycemic drugs: Secondary | ICD-10-CM

## 2018-07-14 DIAGNOSIS — N183 Chronic kidney disease, stage 3 (moderate): Secondary | ICD-10-CM

## 2018-07-14 DIAGNOSIS — M79604 Pain in right leg: Secondary | ICD-10-CM

## 2018-07-14 DIAGNOSIS — Z87891 Personal history of nicotine dependence: Secondary | ICD-10-CM | POA: Diagnosis not present

## 2018-07-14 DIAGNOSIS — E785 Hyperlipidemia, unspecified: Secondary | ICD-10-CM | POA: Diagnosis not present

## 2018-07-15 ENCOUNTER — Encounter (INDEPENDENT_AMBULATORY_CARE_PROVIDER_SITE_OTHER): Payer: Self-pay

## 2018-07-16 DIAGNOSIS — D485 Neoplasm of uncertain behavior of skin: Secondary | ICD-10-CM | POA: Diagnosis not present

## 2018-07-16 DIAGNOSIS — L578 Other skin changes due to chronic exposure to nonionizing radiation: Secondary | ICD-10-CM | POA: Diagnosis not present

## 2018-07-16 DIAGNOSIS — B078 Other viral warts: Secondary | ICD-10-CM | POA: Diagnosis not present

## 2018-07-17 ENCOUNTER — Other Ambulatory Visit
Admission: RE | Admit: 2018-07-17 | Discharge: 2018-07-17 | Disposition: A | Discharge status: Home / Self Care | Payer: Medicare HMO | Source: Ambulatory Visit | Attending: Vascular Surgery | Admitting: Vascular Surgery

## 2018-07-17 ENCOUNTER — Other Ambulatory Visit (INDEPENDENT_AMBULATORY_CARE_PROVIDER_SITE_OTHER): Payer: Self-pay | Admitting: Nurse Practitioner

## 2018-07-17 ENCOUNTER — Other Ambulatory Visit: Payer: Self-pay

## 2018-07-17 DIAGNOSIS — Z1159 Encounter for screening for other viral diseases: Secondary | ICD-10-CM | POA: Diagnosis not present

## 2018-07-18 LAB — NOVEL CORONAVIRUS, NAA (HOSP ORDER, SEND-OUT TO REF LAB; TAT 18-24 HRS): SARS-CoV-2, NAA: NOT DETECTED

## 2018-07-19 ENCOUNTER — Encounter (INDEPENDENT_AMBULATORY_CARE_PROVIDER_SITE_OTHER): Payer: Self-pay | Admitting: Nurse Practitioner

## 2018-07-19 NOTE — Progress Notes (Signed)
SUBJECTIVE:  Patient ID: Misty Villarreal, female    DOB: 11-19-1950, 68 y.o.   MRN: [id number] Chief Complaint  Patient presents with  . Follow-up    HPI  Misty Villarreal is a 68 y.o. female The patient returns to the office for followup and review of the noninvasive studies. There has been a significant deterioration in the lower extremity symptoms.  The patient notes interval shortening of their claudication distance and development of mild rest pain symptoms. No new ulcers or wounds have occurred since the last visit.  There have been no significant changes to the patient's overall health care.  The patient denies amaurosis fugax or recent TIA symptoms. There are no recent neurological changes noted. The patient denies history of DVT, PE or superficial thrombophlebitis. The patient denies recent episodes of angina or shortness of breath.   ABI's Rt=0.86 and Lt=0.77 (previous ABI's Rt=0.84 and Lt=0.64) Duplex US of the lower extremity arterial system shows biphasic waveforms through the right lower extremity, the left lower extremity shows an occluded distal SFA with monophasic waveforms from the proximal SFA to tibial arteries.  Past Medical History:  Diagnosis Date  . Abnormal glandular Papanicolaou smear of cervix 09/01/2014   Normal Pap 2012   . Chronic kidney disease   . Diabetes mellitus without complication ([place])   . Hyperlipidemia   . Slurred speech 12/03/2016    Past Surgical History:  Procedure Laterality Date  . ANGIOPLASTY / STENTING FEMORAL Left 2014, 2013  . BRAIN MENINGIOMA EXCISION  1988  . BREAST BIOPSY Right 1989   benign  . CARPAL TUNNEL RELEASE Left 1980  . CHOLECYSTECTOMY  1987  . COLONOSCOPY WITH PROPOFOL N/A 05/08/2017   Procedure: COLONOSCOPY WITH PROPOFOL;  Surgeon: Jonathon Bellows, MD;  Location: [facility] ENDOSCOPY;  Service: Gastroenterology;  Laterality: N/A;  . KNEE ARTHROPLASTY Right 2009    Social History   Socioeconomic History  . Marital  status: Married    Spouse name: Not on file  . Number of children: Not on file  . Years of education: Not on file  . Highest education level: Not on file  Occupational History  . Not on file  Social Needs  . Financial resource strain: Not on file  . Food insecurity    Worry: Not on file    Inability: Not on file  . Transportation needs    Medical: Not on file    Non-medical: Not on file  Tobacco Use  . Smoking status: Former Smoker    Quit date: 04/05/2013    Years since quitting: 5.2  . Smokeless tobacco: Never Used  Substance and Sexual Activity  . Alcohol use: Yes    Alcohol/week: 2.0 standard drinks    Types: 2 Standard drinks or equivalent per week    Comment: occasional  . Drug use: No  . Sexual activity: Not on file  Lifestyle  . Physical activity    Days per week: Not on file    Minutes per session: Not on file  . Stress: Not on file  Relationships  . Social Herbalist on phone: Not on file    Gets together: Not on file    Attends religious service: Not on file    Active member of club or organization: Not on file    Attends meetings of clubs or organizations: Not on file    Relationship status: Not on file  . Intimate partner violence    Fear of current or ex  partner: Not on file    Emotionally abused: Not on file    Physically abused: Not on file    Forced sexual activity: Not on file  Other Topics Concern  . Not on file  Social History Narrative  . Not on file    Family History  Problem Relation Age of Onset  . Diabetes Mother   . Heart failure Father   . Diabetes Father   . Breast cancer Neg Hx     Allergies  Allergen Reactions  . Augmentin [Amoxicillin-Pot Clavulanate] Diarrhea  . Tetracycline     Other reaction(s): emesis  . Cefaclor Rash  . Cephalexin Rash  . Sulfa Antibiotics Rash    Other reaction(s): emesis     Review of Systems   Review of Systems: Negative Unless Checked Constitutional: [] Weight loss  [] Fever   [] Chills Cardiac: [] Chest pain   []  Atrial Fibrillation  [] Palpitations   [] Shortness of breath when laying flat   [] Shortness of breath with exertion. [] Shortness of breath at rest Vascular:  [x] Pain in legs with walking   [] Pain in legs with standing [x] Pain in legs when laying flat   [x] Claudication    [] Pain in feet when laying flat    [] History of DVT   [] Phlebitis   [] Swelling in legs   [] Varicose veins   [] Non-healing ulcers Pulmonary:   [] Uses home oxygen   [] Productive cough   [] Hemoptysis   [] Wheeze  [] COPD   [] Asthma Neurologic:  [] Dizziness   [] Seizures  [] Blackouts [] History of stroke   [] History of TIA  [] Aphasia   [] Temporary Blindness   [] Weakness or numbness in arm   [] Weakness or numbness in leg Musculoskeletal:   [] Joint swelling   [] Joint pain   [] Low back pain  []  History of Knee Replacement [x] Arthritis [] back Surgeries  []  Spinal Stenosis    Hematologic:  [] Easy bruising  [] Easy bleeding   [] Hypercoagulable state   [] Anemic Gastrointestinal:  [] Diarrhea   [] Vomiting  [] Gastroesophageal reflux/heartburn   [] Difficulty swallowing. [] Abdominal pain Genitourinary:  [] Chronic kidney disease   [] Difficult urination  [] Anuric   [] Blood in urine [] Frequent urination  [] Burning with urination   [] Hematuria Skin:  [] Rashes   [] Ulcers [] Wounds Psychological:  [] History of anxiety   []  History of major depression  []  Memory Difficulties      OBJECTIVE:   Physical Exam  BP (!) 177/80 (BP Location: Left Arm, Patient Position: Sitting, Cuff Size: Normal)   Pulse 76   Resp 14   Ht 5\' 2"  (1.575 m)   Wt 171 lb (77.6 kg)   LMP  (LMP Unknown)   BMI 31.28 kg/m   Gen: WD/WN, NAD Head: [place]/AT, No temporalis wasting.  Ear/Nose/Throat: Hearing grossly intact, nares w/o erythema or drainage Eyes: PER, EOMI, sclera nonicteric.  Neck: Supple, no masses.  No JVD.  Pulmonary:  Good air movement, no use of accessory muscles.  Cardiac: RRR Vascular:  Vessel Right Left  Radial Palpable  Palpable  Dorsalis Pedis Not Palpable Not Palpable  Posterior Tibial Not Palpable Not Palpable   Gastrointestinal: soft, non-distended. No guarding/no peritoneal signs.  Musculoskeletal: M/S 5/5 throughout.  No deformity or atrophy.  Neurologic: Pain and light touch intact in extremities.  Symmetrical.  Speech is fluent. Motor exam as listed above. Psychiatric: Judgment intact, Mood & affect appropriate for pt's clinical situation. Dermatologic: No Venous rashes. No Ulcers Noted.  No changes consistent with cellulitis. Lymph : No Cervical lymphadenopathy, no lichenification or skin changes of chronic lymphedema.  ASSESSMENT AND PLAN:  1. PAD (peripheral artery disease) (HCC) Recommend:  The patient has evidence of severe atherosclerotic changes of both lower extremities with rest pain that is associated with preulcerative changes and impending tissue loss of the foot.  This represents a limb threatening ischemia and places the patient at the Villarreal for limb loss.  Patient should undergo angiography of the left lower extremities with the hope for intervention for limb salvage.  The risks and benefits as well as the alternative therapies was discussed in detail with the patient.  All questions were answered.  Patient agrees to proceed with angiography.  The patient will follow up with me in the office after the procedure.   We will see if angiogram of the left lower extremity improves pain and symptoms and if so we will proceed with the right.    2. Type 2 diabetes mellitus with stage 3 chronic kidney disease, without long-term current use of insulin (HCC) Continue hypoglycemic medications as already ordered, these medications have been reviewed and there are no changes at this time.  Hgb A1C to be monitored as already arranged by primary service   3. Hyperlipidemia associated with type 2 diabetes mellitus ([place]) Continue statin as ordered and reviewed, no changes at this time     Current Outpatient Medications on File Prior to Visit  Medication Sig Dispense Refill  . aspirin 81 MG chewable tablet Chew 1 tablet by mouth daily.     Marland Kitchen atorvastatin (LIPITOR) 40 MG tablet Take 1 tablet (40 mg total) by mouth daily. 90 tablet 1  . clopidogrel (PLAVIX) 75 MG tablet TAKE 1 TABLET DAILY 90 tablet 4  . fexofenadine (ALLEGRA) 180 MG tablet Take 180 mg by mouth daily as needed for allergies.     Marland Kitchen lisinopril (PRINIVIL,ZESTRIL) 20 MG tablet Take 1 tablet (20 mg total) by mouth daily. 90 tablet 3  . meclizine (ANTIVERT) 12.5 MG tablet Take 1 tablet (12.5 mg total) by mouth 2 (two) times daily as needed for dizziness. 30 tablet 5  . metFORMIN (GLUCOPHAGE) 500 MG tablet TAKE 1 TABLET TWICE A DAY 180 tablet 3  . Multiple Vitamin tablet Take 1 tablet by mouth daily.     Marland Kitchen rOPINIRole (REQUIP) 0.5 MG tablet TAKE 1 TO 3 TABLETS(0.5 TO 1.5 MG) BY MOUTH AT BEDTIME (Patient taking differently: Take 1.5 mg by mouth at bedtime. ) 270 tablet 1  . sertraline (ZOLOFT) 50 MG tablet TAKE 1 TABLET DAILY 90 tablet 3   No current facility-administered medications on file prior to visit.     There are no Patient Instructions on file for this visit. No follow-ups on file.   Kris Hartmann, NP  This note was completed with Sales executive.  Any errors are purely unintentional.

## 2018-07-20 MED ORDER — CLINDAMYCIN PHOSPHATE 300 MG/50ML IV SOLN
300.0000 mg | Freq: Once | INTRAVENOUS | Status: AC
  Administered 2018-07-21: 300 mg via INTRAVENOUS

## 2018-07-21 ENCOUNTER — Other Ambulatory Visit: Payer: Self-pay

## 2018-07-21 ENCOUNTER — Ambulatory Visit
Admission: RE | Admit: 2018-07-21 | Discharge: 2018-07-21 | Disposition: A | Discharge status: Home / Self Care | Payer: Medicare HMO | Attending: Vascular Surgery | Admitting: Vascular Surgery

## 2018-07-21 ENCOUNTER — Encounter
Admission: RE | Disposition: A | Discharge status: Home / Self Care | Payer: Self-pay | Source: Home / Self Care | Attending: Vascular Surgery

## 2018-07-21 DIAGNOSIS — I70229 Atherosclerosis of native arteries of extremities with rest pain, unspecified extremity: Secondary | ICD-10-CM

## 2018-07-21 DIAGNOSIS — T82856A Stenosis of peripheral vascular stent, initial encounter: Secondary | ICD-10-CM | POA: Diagnosis not present

## 2018-07-21 DIAGNOSIS — Z87891 Personal history of nicotine dependence: Secondary | ICD-10-CM | POA: Insufficient documentation

## 2018-07-21 DIAGNOSIS — Z7984 Long term (current) use of oral hypoglycemic drugs: Secondary | ICD-10-CM | POA: Diagnosis not present

## 2018-07-21 DIAGNOSIS — E1151 Type 2 diabetes mellitus with diabetic peripheral angiopathy without gangrene: Secondary | ICD-10-CM | POA: Insufficient documentation

## 2018-07-21 DIAGNOSIS — E785 Hyperlipidemia, unspecified: Secondary | ICD-10-CM | POA: Diagnosis not present

## 2018-07-21 DIAGNOSIS — Z79899 Other long term (current) drug therapy: Secondary | ICD-10-CM | POA: Diagnosis not present

## 2018-07-21 DIAGNOSIS — I70223 Atherosclerosis of native arteries of extremities with rest pain, bilateral legs: Secondary | ICD-10-CM | POA: Diagnosis not present

## 2018-07-21 DIAGNOSIS — E1122 Type 2 diabetes mellitus with diabetic chronic kidney disease: Secondary | ICD-10-CM | POA: Insufficient documentation

## 2018-07-21 DIAGNOSIS — N183 Chronic kidney disease, stage 3 (moderate): Secondary | ICD-10-CM | POA: Diagnosis not present

## 2018-07-21 DIAGNOSIS — Z7902 Long term (current) use of antithrombotics/antiplatelets: Secondary | ICD-10-CM | POA: Diagnosis not present

## 2018-07-21 DIAGNOSIS — Z7982 Long term (current) use of aspirin: Secondary | ICD-10-CM | POA: Insufficient documentation

## 2018-07-21 HISTORY — DX: Peripheral vascular disease, unspecified: I73.9

## 2018-07-21 HISTORY — PX: LOWER EXTREMITY ANGIOGRAPHY: CATH118251

## 2018-07-21 LAB — GLUCOSE, CAPILLARY
Glucose-Capillary: 127 mg/dL — ABNORMAL HIGH (ref 70–99)
Glucose-Capillary: 120 mg/dL — ABNORMAL HIGH (ref 70–99)

## 2018-07-21 LAB — CREATININE, SERUM
Creatinine, Ser: 1.46 mg/dL — ABNORMAL HIGH (ref 0.44–1.00)
GFR calc Af Amer: 42 mL/min — ABNORMAL LOW (ref >60)
GFR calc non Af Amer: 37 mL/min — ABNORMAL LOW (ref >60)

## 2018-07-21 LAB — BUN: BUN: 24 mg/dL — ABNORMAL HIGH (ref 8–23)

## 2018-07-21 SURGERY — LOWER EXTREMITY ANGIOGRAPHY
Anesthesia: Moderate Sedation | Site: Leg Lower | Laterality: Left

## 2018-07-21 MED ORDER — ONDANSETRON HCL 4 MG/2ML IJ SOLN: 4.0000 mg | Freq: Four times a day (QID) | INTRAMUSCULAR | Status: DC | PRN

## 2018-07-21 MED ORDER — METHYLPREDNISOLONE SODIUM SUCC 125 MG IJ SOLR: 125.0000 mg | Freq: Once | INTRAMUSCULAR | Status: DC | PRN

## 2018-07-21 MED ORDER — HEPARIN SODIUM (PORCINE) 1000 UNIT/ML IJ SOLN
INTRAMUSCULAR | Status: DC | PRN
  Administered 2018-07-21: 4000 [IU] via INTRAVENOUS

## 2018-07-21 MED ORDER — FENTANYL CITRATE (PF) 100 MCG/2ML IJ SOLN
INTRAMUSCULAR | Status: AC
  Filled 2018-07-21: qty 2

## 2018-07-21 MED ORDER — SODIUM CHLORIDE 0.9 % IV SOLN: 250.0000 mL | INTRAVENOUS | Status: DC | PRN

## 2018-07-21 MED ORDER — MORPHINE SULFATE (PF) 4 MG/ML IV SOLN: 2.0000 mg | INTRAVENOUS | Status: DC | PRN

## 2018-07-21 MED ORDER — FAMOTIDINE 20 MG PO TABS: 40.0000 mg | ORAL_TABLET | Freq: Once | ORAL | Status: DC | PRN

## 2018-07-21 MED ORDER — CLINDAMYCIN PHOSPHATE 300 MG/50ML IV SOLN
INTRAVENOUS | Status: AC
  Filled 2018-07-21: qty 50

## 2018-07-21 MED ORDER — HEPARIN SODIUM (PORCINE) 1000 UNIT/ML IJ SOLN
INTRAMUSCULAR | Status: AC
  Filled 2018-07-21: qty 1

## 2018-07-21 MED ORDER — HYDRALAZINE HCL 20 MG/ML IJ SOLN: 5.0000 mg | INTRAMUSCULAR | Status: DC | PRN

## 2018-07-21 MED ORDER — LABETALOL HCL 5 MG/ML IV SOLN: 10.0000 mg | INTRAVENOUS | Status: DC | PRN

## 2018-07-21 MED ORDER — SODIUM CHLORIDE 0.9 % IV SOLN: INTRAVENOUS | Status: DC

## 2018-07-21 MED ORDER — LIDOCAINE HCL (PF) 1 % IJ SOLN
INTRAMUSCULAR | Status: AC
  Filled 2018-07-21: qty 30

## 2018-07-21 MED ORDER — FENTANYL CITRATE (PF) 100 MCG/2ML IJ SOLN
INTRAMUSCULAR | Status: DC | PRN
  Administered 2018-07-21 (×2): 50 ug via INTRAVENOUS

## 2018-07-21 MED ORDER — MIDAZOLAM HCL 2 MG/2ML IJ SOLN
INTRAMUSCULAR | Status: AC
  Filled 2018-07-21: qty 4

## 2018-07-21 MED ORDER — OXYCODONE HCL 5 MG PO TABS: 5.0000 mg | ORAL_TABLET | ORAL | Status: DC | PRN

## 2018-07-21 MED ORDER — HYDROMORPHONE HCL 1 MG/ML IJ SOLN: 1.0000 mg | Freq: Once | INTRAMUSCULAR | Status: DC | PRN

## 2018-07-21 MED ORDER — SODIUM CHLORIDE 0.9% FLUSH: 3.0000 mL | INTRAVENOUS | Status: DC | PRN

## 2018-07-21 MED ORDER — ACETAMINOPHEN 325 MG PO TABS: 650.0000 mg | ORAL_TABLET | ORAL | Status: DC | PRN

## 2018-07-21 MED ORDER — MIDAZOLAM HCL 2 MG/2ML IJ SOLN
INTRAMUSCULAR | Status: DC | PRN
  Administered 2018-07-21: 2 mg via INTRAVENOUS
  Administered 2018-07-21 (×2): 1 mg via INTRAVENOUS

## 2018-07-21 MED ORDER — SODIUM CHLORIDE 0.9% FLUSH: 3.0000 mL | Freq: Two times a day (BID) | INTRAVENOUS | Status: DC

## 2018-07-21 MED ORDER — MIDAZOLAM HCL 2 MG/ML PO SYRP: 8.0000 mg | ORAL_SOLUTION | Freq: Once | ORAL | Status: DC | PRN

## 2018-07-21 MED ORDER — DIPHENHYDRAMINE HCL 50 MG/ML IJ SOLN: 50.0000 mg | Freq: Once | INTRAMUSCULAR | Status: DC | PRN

## 2018-07-21 MED ORDER — SODIUM CHLORIDE 0.9 % IV SOLN
INTRAVENOUS | Status: DC
  Administered 2018-07-21: 08:00:00 1000 mL via INTRAVENOUS

## 2018-07-21 MED ORDER — IODIXANOL 320 MG/ML IV SOLN
INTRAVENOUS | Status: DC | PRN
  Administered 2018-07-21: 95 mL via INTRA_ARTERIAL

## 2018-07-21 SURGICAL SUPPLY — 23 items
BALLN LUTONIX 5X220X130 (BALLOONS) ×4
BALLN ULTRASCORE 4X150X130 (BALLOONS) ×2
BALLOON LUTONIX 5X220X130 (BALLOONS) IMPLANT
BALLOON ULTRASCORE 4X150X130 (BALLOONS) IMPLANT
CATH BEACON 5 .038 100 VERT TP (CATHETERS) ×1 IMPLANT
CATH CROSSER 14S OTW 146CM (CATHETERS) ×1 IMPLANT
CATH PIG 70CM (CATHETERS) ×1 IMPLANT
CATH SIDEKICK ANG 110 GEOALIGN (CATHETERS) ×1 IMPLANT
COVER PROBE U/S 5X48 (MISCELLANEOUS) ×1 IMPLANT
DEVICE PRESTO INFLATION (MISCELLANEOUS) ×1 IMPLANT
DEVICE STARCLOSE SE CLOSURE (Vascular Products) ×1 IMPLANT
GLIDEWIRE ADV .035X260CM (WIRE) ×1 IMPLANT
KIT FLOWMATE PROCEDURAL (KITS) ×1 IMPLANT
NDL ENTRY 21GA 7CM ECHOTIP (NEEDLE) IMPLANT
NEEDLE ENTRY 21GA 7CM ECHOTIP (NEEDLE) ×4 IMPLANT
PACK ANGIOGRAPHY (CUSTOM PROCEDURE TRAY) ×2 IMPLANT
SET INTRO CAPELLA COAXIAL (SET/KITS/TRAYS/PACK) ×1 IMPLANT
SHEATH ANL2 6FRX45 HC (SHEATH) ×1 IMPLANT
SHEATH BRITE TIP 5FRX11 (SHEATH) ×1 IMPLANT
SYR MEDRAD MARK 7 150ML (SYRINGE) ×1 IMPLANT
TUBING CONTRAST HIGH PRESS 72 (TUBING) ×1 IMPLANT
WIRE J 3MM .035X145CM (WIRE) ×1 IMPLANT
WIRE MAGIC TORQUE 260C (WIRE) ×1 IMPLANT

## 2018-07-21 NOTE — H&P (Signed)
[facility] History & Physical Update  The patient was interviewed and re-examined.  The patient's previous History and Physical has been reviewed and is unchanged.  There is no change in the plan of care. We plan to proceed with the scheduled procedure.  Hortencia Pilar, MD  07/21/2018, 8:18 AM

## 2018-07-21 NOTE — OR Nursing (Signed)
gfr [id number] Dr Delana Meyer notified

## 2018-07-21 NOTE — Op Note (Signed)
[facility] Percutaneous Study/Intervention Procedural Note   Date of Surgery: 07/21/2018  Surgeon:  Katha Cabal, MD.  Pre-operative Diagnosis: Atherosclerotic occlusive disease bilateral lower extremities with mild rest pain of the bilateral lower extremities  Post-operative diagnosis: Same  Procedure(s) Performed: 1. Introduction catheter into left lower extremity 3rd order catheter placement  2. Contrast injection left lower extremity for distal runoff   3. Crosser atherectomy of the left SFA and popliteal arteries 4.  Percutaneous transluminal angioplasty left superficial femoral artery and popliteal             5.  Star close closure right common femoral arteriotomy  Anesthesia: Conscious sedation was administered by the radiology RN under my direct supervision. IV Versed plus fentanyl were utilized. Continuous ECG, pulse oximetry and blood pressure was monitored throughout the entire procedure. Conscious sedation was for a total of 85 minutes.  Sheath: 6 [place] Ansell right common femoral retrograde  Contrast: 95 cc  Fluoroscopy Time: 13.2 minutes  Indications: Jenene Slicker presents with worsening pain of her lower extremities.  Her left is more severely affected than the right.  She is also voicing lifestyle limitations.  Physical examination as well as noninvasive studies demonstrate profound atherosclerotic occlusive disease.  She wishes to proceed with angiography with the hope for intervention for treatment of her PAD.  The risks and benefits are reviewed all questions answered patient agrees to proceed.  Procedure: Korrie Hofbauer is a 68 y.o. y.o. female who was identified and appropriate procedural time out was performed. The patient was then placed supine on the table and prepped and draped in the usual sterile fashion.   Ultrasound was placed in the sterile sleeve and the  right groin was evaluated the right common femoral artery was echolucent and pulsatile indicating patency.  Image was recorded for the permanent record and under real-time visualization a microneedle was inserted into the common femoral artery microwire followed by a micro-sheath.  A J-wire was then advanced through the micro-sheath and a  5 [place] sheath was then inserted over a J-wire. J-wire was then advanced and a 5 French pigtail catheter was positioned at the level of T12. AP projection of the aorta was then obtained. Pigtail catheter was repositioned to above the bifurcation and a RAO view of the pelvis was obtained.  Subsequently a pigtail catheter with the stiff angle Glidewire was used to cross the aortic bifurcation the catheter wire were advanced down into the left distal external iliac artery. Oblique view of the femoral bifurcation was then obtained and subsequently the wire was reintroduced and the pigtail catheter negotiated into the proximal SFA representing third order catheter placement. Distal runoff was then performed.  Diagnostic interpretation: The abdominal aorta is opacified with a bolus ejection contrast.  It is free of hemodynamically significant stenoses.  The right common iliac artery demonstrates a greater than 75% stenosis from its origin extending through the first several centimeters.  There is poststenotic dilatation.  There is 30 to 40% atherosclerotic occlusive disease in the left common iliac.  The external iliac arteries appear patent and free of hemodynamically significant stenoses bilaterally.  Internal iliac arteries are patent.  The left common femoral and profunda femoris are widely patent.  There is no evidence of hemodynamically significant stenosis.  The SFA although patent at its origin is quite small measuring only 1 to 2 mm in diameter.  It occludes in its midportion.  Previously placed stent at Hunter's canal was occluded.  There is reconstitution of the at knee  popliteal.  There is a greater than 80% stenosis in the mid popliteal.  Distal to this the below-knee popliteal is widely patent the trifurcation is patent and there is three-vessel runoff to the foot although the peroneal is quite small and does not extend to the ankle.  The posterior tibial is the dominant runoff to the foot and fills the pedal arch.  The anterior tibial is patent filling the dorsalis pedis but it is quite slow when compared to the posterior tibial.  4000 units of heparin was then given and allowed to circulate and a 6 [place] Ansell sheath was advanced up and over the bifurcation and positioned in the femoral artery  The 14 S Crosser catheter was then prepped on the field and a angled side kick catheter was advanced into the cul-de-sac of the SFA under magnified imaging in the LAO projection. Using the Crosser catheter the occlusion of the SFA and popliteal was negotiated.  Injection of contrast through the Crosser side port confirmed intraluminal positioning.  Kumpe catheter and Magic torque were then advanced down into the distal popliteal and hand-injection contrast was used to verify intraluminal positioning.  A 4 mm x 150 mm ultra score balloon was used to angioplasty the superficial femoral and popliteal arteries. Inflations were to 8 atmospheres for 1 minute. Follow-up imaging demonstrated patency with adequate preparation of the vessel for a drug-coated balloon.  Subsequently, 2 5 mm x 220 mm Lutonix balloon was utilized inflating to 12 atm for 2 full minutes. Distal runoff was then reassessed.  After review of these images the sheath is pulled into the right external iliac oblique of the common femoral is obtained and a Star close device deployed. There no immediate Complications.  Findings:  The abdominal aorta is opacified with a bolus ejection contrast.  It is free of hemodynamically significant stenoses.  The right common iliac artery demonstrates a greater than 75%  stenosis from its origin extending through the first several centimeters.  There is poststenotic dilatation.  There is 30 to 40% atherosclerotic occlusive disease in the left common iliac.  The external iliac arteries appear patent and free of hemodynamically significant stenoses bilaterally.  Internal iliac arteries are patent.  The left common femoral and profunda femoris are widely patent.  There is no evidence of hemodynamically significant stenosis.  The SFA although patent at its origin is quite small measuring only 1 to 2 mm in diameter.  It occludes in its midportion.  Previously placed stent at Hunter's canal was occluded.  There is reconstitution of the at knee popliteal.  There is a greater than 80% stenosis in the mid popliteal.  Distal to this the below-knee popliteal is widely patent the trifurcation is patent and there is three-vessel runoff to the foot although the peroneal is quite small and does not extend to the ankle.  The posterior tibial is the dominant runoff to the foot and fills the pedal arch.  The anterior tibial is patent filling the dorsalis pedis but it is quite slow when compared to the posterior tibial.  Following angioplasty SFA and popliteal are now widely patent with less than 10% residual stenosis distal runoff is preserved and there is now in-line flow and looks quite nice to the foot.    Disposition: Patient was taken to the recovery room in stable condition having tolerated the procedure well.  Belenda Cruise Donique Hammonds 07/21/2018,9:52 AM

## 2018-07-30 ENCOUNTER — Ambulatory Visit (INDEPENDENT_AMBULATORY_CARE_PROVIDER_SITE_OTHER): Payer: Medicare HMO | Admitting: Vascular Surgery

## 2018-07-30 ENCOUNTER — Encounter (INDEPENDENT_AMBULATORY_CARE_PROVIDER_SITE_OTHER): Payer: Medicare HMO

## 2018-08-06 ENCOUNTER — Encounter (INDEPENDENT_AMBULATORY_CARE_PROVIDER_SITE_OTHER): Payer: Medicare HMO

## 2018-08-06 ENCOUNTER — Ambulatory Visit (INDEPENDENT_AMBULATORY_CARE_PROVIDER_SITE_OTHER): Payer: Medicare HMO | Admitting: Vascular Surgery

## 2018-08-08 DIAGNOSIS — E119 Type 2 diabetes mellitus without complications: Secondary | ICD-10-CM | POA: Diagnosis not present

## 2018-08-10 ENCOUNTER — Other Ambulatory Visit (INDEPENDENT_AMBULATORY_CARE_PROVIDER_SITE_OTHER): Payer: Self-pay | Admitting: Vascular Surgery

## 2018-08-10 DIAGNOSIS — Z95820 Peripheral vascular angioplasty status with implants and grafts: Secondary | ICD-10-CM

## 2018-08-10 DIAGNOSIS — I70223 Atherosclerosis of native arteries of extremities with rest pain, bilateral legs: Secondary | ICD-10-CM

## 2018-08-13 ENCOUNTER — Ambulatory Visit (INDEPENDENT_AMBULATORY_CARE_PROVIDER_SITE_OTHER): Payer: Medicare HMO

## 2018-08-13 ENCOUNTER — Ambulatory Visit (INDEPENDENT_AMBULATORY_CARE_PROVIDER_SITE_OTHER): Payer: Medicare HMO | Admitting: Vascular Surgery

## 2018-08-13 ENCOUNTER — Other Ambulatory Visit: Payer: Self-pay

## 2018-08-13 ENCOUNTER — Encounter (INDEPENDENT_AMBULATORY_CARE_PROVIDER_SITE_OTHER): Payer: Self-pay | Admitting: Vascular Surgery

## 2018-08-13 VITALS — BP 148/78 | HR 76 | Resp 12 | Ht 63.0 in | Wt 170.0 lb

## 2018-08-13 DIAGNOSIS — E1169 Type 2 diabetes mellitus with other specified complication: Secondary | ICD-10-CM

## 2018-08-13 DIAGNOSIS — I739 Peripheral vascular disease, unspecified: Secondary | ICD-10-CM | POA: Diagnosis not present

## 2018-08-13 DIAGNOSIS — Z791 Long term (current) use of non-steroidal anti-inflammatories (NSAID): Secondary | ICD-10-CM | POA: Diagnosis not present

## 2018-08-13 DIAGNOSIS — N183 Chronic kidney disease, stage 3 unspecified: Secondary | ICD-10-CM

## 2018-08-13 DIAGNOSIS — E1122 Type 2 diabetes mellitus with diabetic chronic kidney disease: Secondary | ICD-10-CM | POA: Diagnosis not present

## 2018-08-13 DIAGNOSIS — I10 Essential (primary) hypertension: Secondary | ICD-10-CM | POA: Diagnosis not present

## 2018-08-13 DIAGNOSIS — Z87891 Personal history of nicotine dependence: Secondary | ICD-10-CM | POA: Diagnosis not present

## 2018-08-13 DIAGNOSIS — M150 Primary generalized (osteo)arthritis: Secondary | ICD-10-CM | POA: Diagnosis not present

## 2018-08-13 DIAGNOSIS — M159 Polyosteoarthritis, unspecified: Secondary | ICD-10-CM

## 2018-08-13 DIAGNOSIS — Z95820 Peripheral vascular angioplasty status with implants and grafts: Secondary | ICD-10-CM | POA: Diagnosis not present

## 2018-08-13 DIAGNOSIS — Z79899 Other long term (current) drug therapy: Secondary | ICD-10-CM

## 2018-08-13 DIAGNOSIS — E785 Hyperlipidemia, unspecified: Secondary | ICD-10-CM | POA: Diagnosis not present

## 2018-08-13 DIAGNOSIS — I70223 Atherosclerosis of native arteries of extremities with rest pain, bilateral legs: Secondary | ICD-10-CM | POA: Diagnosis not present

## 2018-08-13 NOTE — Progress Notes (Signed)
MRN : [id number]  Misty Villarreal is a 68 y.o. (03-01-1950) female who presents with chief complaint of No chief complaint on file. Marland Kitchen  History of Present Illness:   The patient returns to the office for followup and review status post angiogram with intervention on 07/21/2018.   Procedure(s) Performed: 1. Introduction catheter into left lower extremity 3rd order catheter placement  2. Contrast injection left lower extremity for distal runoff   3. Crosser atherectomy of the left SFA and popliteal arteries 4.  Percutaneous transluminal angioplasty left superficial femoral artery and popliteal             5.  Star close closure right common femoral arteriotomy  The patient notes improvement in the lower extremity symptoms. No interval shortening of the patient's claudication distance or rest pain symptoms. Previous wounds have now healed.  No new ulcers or wounds have occurred since the last visit.  There have been no significant changes to the patient's overall health care.  The patient denies amaurosis fugax or recent TIA symptoms. There are no recent neurological changes noted. The patient denies history of DVT, PE or superficial thrombophlebitis. The patient denies recent episodes of angina or shortness of breath.   ABI's Rt=0.91and Lt=0.99  (previous ABI's Rt=0.86 and Lt=0.77 )   No outpatient medications have been marked as taking for the 08/13/18 encounter (Appointment) with Delana Meyer, Dolores Lory, MD.    Past Medical History:  Diagnosis Date   Abnormal glandular Papanicolaou smear of cervix 09/01/2014   Normal Pap 2012    Chronic kidney disease    Diabetes mellitus without complication ([place])    Hyperlipidemia    Peripheral vascular disease ([place])    Slurred speech 12/03/2016    Past Surgical History:  Procedure Laterality Date   ANGIOPLASTY / STENTING FEMORAL Left 2014, 2013   [place] Right 1989   benign   [place]   COLONOSCOPY WITH PROPOFOL N/A 05/08/2017   Procedure: COLONOSCOPY WITH PROPOFOL;  Surgeon: Jonathon Bellows, MD;  Location: [facility] ENDOSCOPY;  Service: Gastroenterology;  Laterality: N/A;   KNEE ARTHROPLASTY Right 2009   LOWER EXTREMITY ANGIOGRAPHY Left 07/21/2018   Procedure: LOWER EXTREMITY ANGIOGRAPHY;  Surgeon: Katha Cabal, MD;  Location: [place] CV LAB;  Service: Cardiovascular;  Laterality: Left;    Social History Social History   Tobacco Use   Smoking status: Former Smoker    Quit date: 04/05/2013    Years since quitting: 5.3   Smokeless tobacco: Never Used  Substance Use Topics   Alcohol use: Yes    Alcohol/week: 2.0 standard drinks    Types: 2 Standard drinks or equivalent per week    Comment: occasional   Drug use: No    Family History Family History  Problem Relation Age of Onset   Diabetes Mother    Heart failure Father    Diabetes Father    Breast cancer Neg Hx     Allergies  Allergen Reactions   Augmentin [Amoxicillin-Pot Clavulanate] Diarrhea   Tetracycline     Other reaction(s): emesis   Cefaclor Rash   Cephalexin Rash   Sulfa Antibiotics Rash    Other reaction(s): emesis     REVIEW OF SYSTEMS (Negative unless checked)  Constitutional: [] Weight loss  [] Fever  [] Chills Cardiac: [] Chest pain   [] Chest pressure   [] Palpitations   [] Shortness of breath when laying flat   [] Shortness of  breath with exertion. Vascular:  [x] Pain in legs with walking   [] Pain in legs at rest  [] History of DVT   [] Phlebitis   [] Swelling in legs   [] Varicose veins   [] Non-healing ulcers Pulmonary:   [] Uses home oxygen   [] Productive cough   [] Hemoptysis   [] Wheeze  [] COPD   [] Asthma Neurologic:  [] Dizziness   [] Seizures   [] History of stroke   [] History of TIA  [] Aphasia   [] Vissual changes   [] Weakness or numbness in arm   [] Weakness or numbness in  leg Musculoskeletal:   [] Joint swelling   [] Joint pain   [] Low back pain Hematologic:  [] Easy bruising  [] Easy bleeding   [] Hypercoagulable state   [] Anemic Gastrointestinal:  [] Diarrhea   [] Vomiting  [] Gastroesophageal reflux/heartburn   [] Difficulty swallowing. Genitourinary:  [x] Chronic kidney disease   [] Difficult urination  [] Frequent urination   [] Blood in urine Skin:  [] Rashes   [] Ulcers  Psychological:  [] History of anxiety   []  History of major depression.  Physical Examination  There were no vitals filed for this visit. There is no height or weight on file to calculate BMI. Gen: WD/WN, NAD Head: [place]/AT, No temporalis wasting.  Ear/Nose/Throat: Hearing grossly intact, nares w/o erythema or drainage Eyes: PER, EOMI, sclera nonicteric.  Neck: Supple, no large masses.   Pulmonary:  Good air movement, no audible wheezing bilaterally, no use of accessory muscles.  Cardiac: RRR, no JVD Vascular: scattered varicosities present bilaterally.  Mild venous stasis changes to the legs bilaterally.  2+ soft pitting edema Vessel Right Left  Radial Palpable Palpable  PT Trace Palpable Trace Palpable  DP Trace Palpable Trace Palpable  Gastrointestinal: Non-distended. No guarding/no peritoneal signs.  Musculoskeletal: M/S 5/5 throughout.  No deformity or atrophy.  Neurologic: CN 2-12 intact. Symmetrical.  Speech is fluent. Motor exam as listed above. Psychiatric: Judgment intact, Mood & affect appropriate for pt's clinical situation. Dermatologic: No rashes or ulcers noted.  No changes consistent with cellulitis. Lymph : No lichenification or skin changes of chronic lymphedema.  CBC Lab Results  Component Value Date   WBC 9.4 08/04/2017   HGB 11.7 08/04/2017   HCT 35.4 08/04/2017   MCV 88 08/04/2017   PLT 341 08/04/2017    BMET    Component Value Date/Time   NA 136 01/28/2018 1107   NA 139 04/27/2013 0713   K 5.8 (H) 01/28/2018 1107   K 4.2 04/27/2013 0713   CL 101 01/28/2018  1107   CL 108 (H) 04/27/2013 0713   CO2 20 01/28/2018 1107   CO2 25 04/27/2013 0713   GLUCOSE 128 (H) 01/28/2018 1107   GLUCOSE 146 (H) 12/03/2016 1625   GLUCOSE 137 (H) 04/27/2013 0713   BUN 24 (H) 07/21/2018 0735   BUN 28 (H) 01/28/2018 1107   BUN 21 (H) 04/27/2013 0713   CREATININE 1.46 (H) 07/21/2018 0735   CREATININE 1.28 04/27/2013 0713   CALCIUM 9.6 01/28/2018 1107   CALCIUM 9.3 04/27/2013 0713   GFRNONAA 37 (L) 07/21/2018 0735   GFRNONAA 44 (L) 04/27/2013 0713   GFRAA 42 (L) 07/21/2018 0735   GFRAA 52 (L) 04/27/2013 0713   CrCl cannot be calculated (Patient's most recent lab result is older than the maximum 21 days allowed.).  COAG Lab Results  Component Value Date   INR 0.85 12/03/2016    Radiology Vas [place] Burnard Bunting With/wo Tbi  Result Date: 07/14/2018 LOWER EXTREMITY DOPPLER STUDY Indications: Peripheral artery disease, and 04/15/2012 PTA left SFA  05/21/2011 Left PTA and left SFA stent.  Comparison Study: 01/29/2018 Performing Technologist: Almira Coaster RVS  Examination Guidelines: A complete evaluation includes at minimum, Doppler waveform signals and systolic blood pressure reading at the level of bilateral brachial, anterior tibial, and posterior tibial arteries, when vessel segments are accessible. Bilateral testing is considered an integral part of a complete examination. Photoelectric Plethysmograph (PPG) waveforms and toe systolic pressure readings are included as required and additional duplex testing as needed. Limited examinations for reoccurring indications may be performed as noted.  ABI Findings: +---------+------------------+-----+--------+--------+  Right     Rt Pressure (mmHg) Index Waveform Comment   +---------+------------------+-----+--------+--------+  Brachial  166                                         +---------+------------------+-----+--------+--------+  ATA       143                0.86  biphasic            +---------+------------------+-----+--------+--------+  PTA       142                0.86  biphasic           +---------+------------------+-----+--------+--------+  Great Toe 114                0.69  Normal             +---------+------------------+-----+--------+--------+ +---------+------------------+-----+----------+-------+  Left      Lt Pressure (mmHg) Index Waveform   Comment  +---------+------------------+-----+----------+-------+  Brachial  162                                          +---------+------------------+-----+----------+-------+  ATA       111                0.67  monophasic          +---------+------------------+-----+----------+-------+  PTA       128                0.77  monophasic          +---------+------------------+-----+----------+-------+  Great Toe 90                 0.54  Abnormal            +---------+------------------+-----+----------+-------+ +-------+-----------+-----------+------------+------------+  ABI/TBI Today's ABI Today's TBI Previous ABI Previous TBI  +-------+-----------+-----------+------------+------------+  Right   .86         .69         .84          .61           +-------+-----------+-----------+------------+------------+  Left    .77         .54         .64          .40           +-------+-----------+-----------+------------+------------+ Left ABIs appear increased compared to prior study on 01/29/2018. Left TBIs appear increased compared to prior study on 01/29/2018.  Summary: Right: Resting right ankle-brachial index indicates mild right lower extremity arterial disease. The right toe-brachial index is normal. Left: Resting left ankle-brachial index indicates moderate left lower extremity arterial disease. The left toe-brachial index is abnormal.  *  See table(s) above for measurements and observations.  Electronically signed by Leotis Pain MD on 07/14/2018 at 1:09:30 PM.    Final    Vas [place] Lower Extremity Arterial Duplex  Result Date: 07/14/2018 LOWER EXTREMITY  ARTERIAL DUPLEX STUDY  Current ABI: Rt .86,Lt .77 Performing Technologist: Almira Coaster RVS  Examination Guidelines: A complete evaluation includes B-mode imaging, spectral Doppler, color Doppler, and power Doppler as needed of all accessible portions of each vessel. Bilateral testing is considered an integral part of a complete examination. Limited examinations for reoccurring indications may be performed as noted.  +-----------+--------+-----+--------+---------+--------+  RIGHT       PSV cm/s Ratio Stenosis Waveform  Comments  +-----------+--------+-----+--------+---------+--------+  CFA Distal  138                     triphasic           +-----------+--------+-----+--------+---------+--------+  DFA         81                      biphasic            +-----------+--------+-----+--------+---------+--------+  SFA Prox    102                     biphasic            +-----------+--------+-----+--------+---------+--------+  SFA Mid     120                     biphasic            +-----------+--------+-----+--------+---------+--------+  SFA Distal  107                     biphasic            +-----------+--------+-----+--------+---------+--------+  POP Distal  86                      biphasic            +-----------+--------+-----+--------+---------+--------+  ATA Distal  70                      biphasic            +-----------+--------+-----+--------+---------+--------+  PTA Distal  80                      biphasic            +-----------+--------+-----+--------+---------+--------+  PERO Distal 37                      biphasic            +-----------+--------+-----+--------+---------+--------+  +-----------+--------+-----+--------+----------+--------+  LEFT        PSV cm/s Ratio Stenosis Waveform   Comments  +-----------+--------+-----+--------+----------+--------+  CFA Distal  140                     biphasic             +-----------+--------+-----+--------+----------+--------+  DFA         94                       biphasic             +-----------+--------+-----+--------+----------+--------+  SFA Prox    97  monophasic           +-----------+--------+-----+--------+----------+--------+  SFA Mid     59                      monophasic           +-----------+--------+-----+--------+----------+--------+  SFA Distal  0                       Occluded             +-----------+--------+-----+--------+----------+--------+  POP Distal  43                      monophasic           +-----------+--------+-----+--------+----------+--------+  ATA Distal  40                      monophasic           +-----------+--------+-----+--------+----------+--------+  PTA Distal  37                      monophasic           +-----------+--------+-----+--------+----------+--------+  PERO Distal 20                      monophasic           +-----------+--------+-----+--------+----------+--------+ Left SFA Distal segment appears to be occluded but sflow restored Via collateral in the [place] Proximal segment.  Summary: See table(s) above for measurements and observations. Electronically signed by Leotis Pain MD on 07/14/2018 at 1:09:27 PM.    Final       Assessment/Plan 1. PAD (peripheral artery disease) (HCC) Recommend:  The patient is status post successful angiogram with intervention.  The patient reports that the claudication symptoms and leg pain is essentially gone.   The patient denies lifestyle limiting changes at this point in time.  No further invasive studies, angiography or surgery at this time The patient should continue walking and begin a more formal exercise program.  The patient should continue antiplatelet therapy and aggressive treatment of the lipid abnormalities  Smoking cessation was again discussed  The patient should continue wearing graduated compression socks 10-15 mmHg strength to control the mild edema.  Patient should undergo noninvasive studies as ordered. The patient will follow up  with me after the studies.   - VAS [place] LOWER EXTREMITY ARTERIAL DUPLEX; Future - VAS [place] ABI WITH/WO TBI; Future  2. Essential (primary) hypertension Continue antihypertensive medications as already ordered, these medications have been reviewed and there are no changes at this time.   3. Hyperlipidemia associated with type 2 diabetes mellitus ([place]) Continue statin as ordered and reviewed, no changes at this time   4. Type 2 diabetes mellitus with stage 3 chronic kidney disease, without long-term current use of insulin (HCC) Continue hypoglycemic medications as already ordered, these medications have been reviewed and there are no changes at this time.  Hgb A1C to be monitored as already arranged by primary service   5. Primary osteoarthritis involving multiple joints Continue NSAID medications as already ordered, these medications have been reviewed and there are no changes at this time.  Continued activity and therapy was stressed.    Hortencia Pilar, MD  08/13/2018 8:26 AM

## 2018-09-09 ENCOUNTER — Ambulatory Visit (INDEPENDENT_AMBULATORY_CARE_PROVIDER_SITE_OTHER): Payer: Medicare HMO | Admitting: Internal Medicine

## 2018-09-09 ENCOUNTER — Encounter: Payer: Self-pay | Admitting: Internal Medicine

## 2018-09-09 ENCOUNTER — Other Ambulatory Visit: Payer: Self-pay

## 2018-09-09 VITALS — BP 126/76 | HR 83 | Ht 63.0 in | Wt 172.0 lb

## 2018-09-09 DIAGNOSIS — I739 Peripheral vascular disease, unspecified: Secondary | ICD-10-CM

## 2018-09-09 DIAGNOSIS — I10 Essential (primary) hypertension: Secondary | ICD-10-CM | POA: Diagnosis not present

## 2018-09-09 DIAGNOSIS — Z23 Encounter for immunization: Secondary | ICD-10-CM | POA: Diagnosis not present

## 2018-09-09 DIAGNOSIS — E1169 Type 2 diabetes mellitus with other specified complication: Secondary | ICD-10-CM | POA: Diagnosis not present

## 2018-09-09 DIAGNOSIS — G2581 Restless legs syndrome: Secondary | ICD-10-CM | POA: Diagnosis not present

## 2018-09-09 DIAGNOSIS — N183 Chronic kidney disease, stage 3 unspecified: Secondary | ICD-10-CM

## 2018-09-09 DIAGNOSIS — Z0000 Encounter for general adult medical examination without abnormal findings: Secondary | ICD-10-CM | POA: Diagnosis not present

## 2018-09-09 DIAGNOSIS — E118 Type 2 diabetes mellitus with unspecified complications: Secondary | ICD-10-CM

## 2018-09-09 DIAGNOSIS — I479 Paroxysmal tachycardia, unspecified: Secondary | ICD-10-CM | POA: Insufficient documentation

## 2018-09-09 DIAGNOSIS — E785 Hyperlipidemia, unspecified: Secondary | ICD-10-CM

## 2018-09-09 DIAGNOSIS — F324 Major depressive disorder, single episode, in partial remission: Secondary | ICD-10-CM

## 2018-09-09 DIAGNOSIS — R69 Illness, unspecified: Secondary | ICD-10-CM | POA: Diagnosis not present

## 2018-09-09 LAB — POCT URINALYSIS DIPSTICK
Bilirubin, UA: NEGATIVE
Blood, UA: NEGATIVE
Glucose, UA: NEGATIVE
Ketones, UA: NEGATIVE
Leukocytes, UA: NEGATIVE
Nitrite, UA: NEGATIVE
Protein, UA: NEGATIVE
Spec Grav, UA: 1.015 (ref 1.010–1.025)
Urobilinogen, UA: 0.2 E.U./dL
pH, UA: 5 (ref 5.0–8.0)

## 2018-09-09 MED ORDER — MECLIZINE HCL 12.5 MG PO TABS: 12.5000 mg | ORAL_TABLET | Freq: Two times a day (BID) | ORAL | 5 refills | Status: DC | PRN

## 2018-09-09 MED ORDER — GABAPENTIN 100 MG PO CAPS: 100.0000 mg | ORAL_CAPSULE | Freq: Every day | ORAL | 0 refills | Status: DC

## 2018-09-09 NOTE — Progress Notes (Signed)
Date:  09/09/2018   Name:  Misty Villarreal   DOB:  [birth year]   MRN:  [id number]   Chief Complaint: Annual Exam (Breast Exam. No Pap.) Misty Villarreal is a 68 y.o. female who presents today for her Complete Annual Exam. She feels fairly well. She reports exercising staying busy with house, yard and hobbies. She reports she is sleeping fairly well. Her husband passed away in Jun 23, 2022 from CHF on hospice at home.  Mammogram 01/2018 DEXA  01/2018 Colonoscopy 04/2017 Pneumonia vaccines - Prevnar completed, PPV-23 due  Diabetes She presents for her follow-up diabetic visit. She has type 2 diabetes mellitus. Her disease course has been stable. Pertinent negatives for hypoglycemia include no dizziness, headaches, nervousness/anxiousness or tremors. Pertinent negatives for diabetes include no chest pain, no fatigue, no polydipsia and no polyuria. Symptoms are stable. Current diabetic treatment includes oral agent (monotherapy) (metformin). She is compliant with treatment all of the time. Her weight is stable. She monitors blood glucose at home 1-2 x per day. Her breakfast blood glucose is taken between 7-8 am. Her breakfast blood glucose range is generally 130-140 mg/dl. An ACE inhibitor/angiotensin II receptor blocker is being taken. Eye exam is current.  Hypertension This is a chronic problem. The problem is controlled (normal at home ). Pertinent negatives include no chest pain, headaches, palpitations or shortness of breath. Past treatments include ACE inhibitors. The current treatment provides significant improvement.  Hyperlipidemia The problem is controlled. Associated symptoms include myalgias. Pertinent negatives include no chest pain or shortness of breath. Current antihyperlipidemic treatment includes statins. The current treatment provides significant improvement of lipids.  RLS - taking requip nightly with some benefit, having more muscle cramps recently. These often waken her in the night  as well.   PAD - seeing vascular surgery and discussed muscle cramps.  Recent left angiography with improvement in pad symptoms.  VS suggested trying gabapentin.  She is trying to walk daily for 30 -60 minutes.  Lab Results  Component Value Date   HGBA1C 6.9 (H) 01/28/2018   Lab Results  Component Value Date   CREATININE 1.46 (H) 07/21/2018   BUN 24 (H) 07/21/2018   NA 136 01/28/2018   K 5.8 (H) 01/28/2018   CL 101 01/28/2018   CO2 20 01/28/2018   Lab Results  Component Value Date   CHOL 209 (H) 08/04/2017   HDL 54 08/04/2017   LDLCALC 104 (H) 08/04/2017   TRIG 254 (H) 08/04/2017   CHOLHDL 3.9 08/04/2017     Review of Systems  Constitutional: Negative for chills, fatigue and fever.  HENT: Negative for congestion, hearing loss, tinnitus, trouble swallowing and voice change.   Eyes: Negative for visual disturbance.  Respiratory: Negative for cough, chest tightness, shortness of breath and wheezing.   Cardiovascular: Negative for chest pain, palpitations and leg swelling.  Gastrointestinal: Negative for abdominal pain, constipation, diarrhea and vomiting.  Endocrine: Negative for polydipsia and polyuria.  Genitourinary: Positive for menstrual problem. Negative for dysuria, frequency, genital sores, vaginal bleeding and vaginal discharge.  Musculoskeletal: Positive for myalgias. Negative for arthralgias, gait problem and joint swelling.  Skin: Negative for color change and rash.  Allergic/Immunologic: Negative for environmental allergies.  Neurological: Negative for dizziness, tremors, light-headedness and headaches.  Hematological: Negative for adenopathy. Does not bruise/bleed easily.  Psychiatric/Behavioral: Negative for dysphoric mood and sleep disturbance. The patient is not nervous/anxious.     Patient Active Problem List   Diagnosis Date Noted   Osteopenia determined by x-ray 02/16/2018  Major depressive disorder with single episode, in partial remission ([place])  08/04/2017   Neck pain on right side 08/05/2016   Tinnitus of right ear 08/05/2016   Type II diabetes mellitus with complication ([place]) 11/94/1740   Anemia 06/24/2015   Localized edema 06/21/2015   History of paroxysmal supraventricular tachycardia 06/21/2015   PAD (peripheral artery disease) ([place]) 02/02/2015   Hyperlipidemia associated with type 2 diabetes mellitus ([place]) 09/01/2014   Neuropathy 09/01/2014   Phlebectasia 09/01/2014   CKD (chronic kidney disease) stage 3, GFR 30-59 ml/min ([place]) 09/01/2014   Essential (primary) hypertension 09/01/2014   Spondylolisthesis at L4-L5 level 07/05/2014   Arthritis, degenerative 01/31/2014    Allergies  Allergen Reactions   Augmentin [Amoxicillin-Pot Clavulanate] Diarrhea   Tetracycline     Other reaction(s): emesis   Cefaclor Rash   Cephalexin Rash   Sulfa Antibiotics Rash    Other reaction(s): emesis    Past Surgical History:  Procedure Laterality Date   ANGIOPLASTY / STENTING FEMORAL Left 2014, 2013   [place] Right 1989   benign   CARPAL TUNNEL RELEASE Left [place]   COLONOSCOPY WITH PROPOFOL N/A 05/08/2017   Procedure: COLONOSCOPY WITH PROPOFOL;  Surgeon: Jonathon Bellows, MD;  Location[facility] ENDOSCOPY;  Service: Gastroenterology;  Laterality: N/A;   KNEE ARTHROPLASTY Right 2009   LOWER EXTREMITY ANGIOGRAPHY Left 07/21/2018   Procedure: LOWER EXTREMITY ANGIOGRAPHY;  Surgeon: Katha Cabal, MD;  Location: [place] [facility] LAB;  Service: Cardiovascular;  Laterality: Left;    Social History   Tobacco Use   Smoking status: Former Smoker    Quit date: 04/05/2013    Years since quitting: 5.4   Smokeless tobacco: Never Used  Substance Use Topics   Alcohol use: Yes    Alcohol/week: 2.0 standard drinks    Types: 2 Standard drinks or equivalent per week    Comment: occasional   Drug use: No     Medication list has been reviewed and  updated.  Current Meds  Medication Sig   aspirin 81 MG chewable tablet Chew 1 tablet by mouth daily.    atorvastatin (LIPITOR) 40 MG tablet Take 1 tablet (40 mg total) by mouth daily.   clopidogrel (PLAVIX) 75 MG tablet TAKE 1 TABLET DAILY   fexofenadine (ALLEGRA) 180 MG tablet Take 180 mg by mouth daily as needed for allergies.    lisinopril (PRINIVIL,ZESTRIL) 20 MG tablet Take 1 tablet (20 mg total) by mouth daily.   Magnesium 250 MG TABS Take 500 mg by mouth at bedtime.   meclizine (ANTIVERT) 12.5 MG tablet Take 1 tablet (12.5 mg total) by mouth 2 (two) times daily as needed for dizziness.   metFORMIN (GLUCOPHAGE) 500 MG tablet TAKE 1 TABLET TWICE A DAY   Multiple Vitamin tablet Take 1 tablet by mouth daily.    rOPINIRole (REQUIP) 0.5 MG tablet TAKE 1 TO 3 TABLETS(0.5 TO 1.5 MG) BY MOUTH AT BEDTIME (Patient taking differently: Take 1.5 mg by mouth at bedtime. Patient said she takes 2-3 nightly)   sertraline (ZOLOFT) 50 MG tablet TAKE 1 TABLET DAILY    PHQ 2/9 Scores 09/09/2018 04/28/2018 01/28/2018 01/28/2018  PHQ - 2 Score 1 0 0 0  PHQ- 9 Score 1 - 0 -    BP Readings from Last 3 Encounters:  09/09/18 126/76  08/13/18 (!) 148/78  07/21/18 (!) 154/73    Physical Exam Vitals signs and nursing note reviewed.  Constitutional:  General: She is not in acute distress.    Appearance: She is well-developed.  HENT:     Head: Normocephalic and atraumatic.     Right Ear: Tympanic membrane and ear canal normal.     Left Ear: Tympanic membrane and ear canal normal.     Nose:     Right Sinus: No maxillary sinus tenderness.     Left Sinus: No maxillary sinus tenderness.  Eyes:     General: No scleral icterus.       Right eye: No discharge.        Left eye: No discharge.     Conjunctiva/sclera: Conjunctivae normal.  Neck:     Musculoskeletal: Normal range of motion. No erythema.     Thyroid: No thyromegaly.     Vascular: No carotid bruit.  Cardiovascular:     Rate and  Rhythm: Normal rate and regular rhythm.     Pulses: Normal pulses.     Heart sounds: Normal heart sounds.  Pulmonary:     Effort: Pulmonary effort is normal. No respiratory distress.     Breath sounds: No wheezing.  Chest:     Breasts:        Right: No mass, nipple discharge, skin change or tenderness.        Left: No mass, nipple discharge, skin change or tenderness.  Abdominal:     General: Bowel sounds are normal.     Palpations: Abdomen is soft.     Tenderness: There is no abdominal tenderness.  Musculoskeletal: Normal range of motion.  Lymphadenopathy:     Cervical: No cervical adenopathy.  Skin:    General: Skin is warm and dry.     Findings: No rash.  Neurological:     Mental Status: She is alert and oriented to person, place, and time.     Cranial Nerves: No cranial nerve deficit.     Sensory: No sensory deficit.     Deep Tendon Reflexes: Reflexes are normal and symmetric.  Psychiatric:        Speech: Speech normal.        Behavior: Behavior normal.        Thought Content: Thought content normal.     Wt Readings from Last 3 Encounters:  09/09/18 172 lb (78 kg)  08/13/18 170 lb (77.1 kg)  07/21/18 162 lb (73.5 kg)    BP 126/76    Pulse 83    Ht 5\' 3"  (1.6 m)    Wt 172 lb (78 kg)    LMP  (LMP Unknown)    SpO2 98%    BMI 30.47 kg/m   Assessment and Plan: 1. Annual physical exam Normal exam except for mildly overweight Continue exercise, work on diet changes - POCT urinalysis dipstick  2. CKD (chronic kidney disease) stage 3, GFR 30-59 ml/min (HCC) Exam is stable with no edema, SOB or other sx GFR has been gradually decreasing over the past 3 years Continue to avoid nsaids; monitor at intervals Nephrology following as well - unsure of last visit - Comprehensive metabolic panel  3. Type II diabetes mellitus with complication (HCC) Clinically stable by exam and report without s/s of hypoglycemia. DM complicated by renal disease and PAD but no  retinopathy. Tolerating medications metformin 500 mg daily well without side effects or other concerns. Pt to schedule DM eye exam yearly Foot exam with normal sensation and intact skin today - Comprehensive metabolic panel - Hemoglobin A1c - TSH  4. Hyperlipidemia associated with type 2  diabetes mellitus ([place]) Pt on statin therapy without myopathy, headache or abdominal sx LDL not at goal of < 70 on atorvastatin 40 mg - will recheck and adjust dose if needed - Lipid panel  5. Essential (primary) hypertension Clinically stable exam with well controlled BP.   Tolerating medications, lisinopril 20 mg daily, without side effects at this time.  No cough, chest pain, or SOB. Pt to continue current regimen and low sodium diet; benefits of regular exercise as able discussed. - CBC with Differential/Platelet  6. Major depressive disorder with single episode, in partial remission ([place]) Clinically doing well on sertraline 50 mg.  No s/s worsening depression, suicidal thoughts or actions despite recent passing of her husband Will continue current therapy - follow up if needed  7. PAD (peripheral artery disease) (HCC) S/p several interventions in LLE Now with palpable pulses, intact skin without ulceration or claudication/rest pain. She does have muscle cramps that are interfering with her quality of life.  8. Need for vaccination for pneumococcus - Pneumococcal polysaccharide vaccine 23-valent greater than or equal to 2yo subcutaneous/IM  9. Restless leg syndrome Worsening sx despite Requip Will add gabapentin 100 mg then work up to 300 mg if needed at bedtime - gabapentin (NEURONTIN) 100 MG capsule; Take 1-3 capsules (100-300 mg total) by mouth at bedtime.  Dispense: 270 capsule; Refill: 0   Partially dictated using Editor, commissioning. Any errors are unintentional.  Halina Maidens, MD [facility]  09/09/2018

## 2018-09-10 LAB — COMPREHENSIVE METABOLIC PANEL
ALT: 11 IU/L (ref 0–32)
AST: 16 IU/L (ref 0–40)
Albumin/Globulin Ratio: 1.9 (ref 1.2–2.2)
Albumin: 4.5 g/dL (ref 3.8–4.8)
Alkaline Phosphatase: 87 IU/L (ref 39–117)
BUN/Creatinine Ratio: 29 — ABNORMAL HIGH (ref 12–28)
BUN: 47 mg/dL — ABNORMAL HIGH (ref 8–27)
Bilirubin Total: 0.7 mg/dL (ref 0.0–1.2)
CO2: 21 mmol/L (ref 20–29)
Calcium: 9.6 mg/dL (ref 8.7–10.3)
Chloride: 100 mmol/L (ref 96–106)
Creatinine, Ser: 1.64 mg/dL — ABNORMAL HIGH (ref 0.57–1.00)
GFR calc Af Amer: 37 mL/min/{1.73_m2} — ABNORMAL LOW (ref >59)
GFR calc non Af Amer: 32 mL/min/{1.73_m2} — ABNORMAL LOW (ref >59)
Globulin, Total: 2.4 g/dL (ref 1.5–4.5)
Glucose: 118 mg/dL — ABNORMAL HIGH (ref 65–99)
Potassium: 5.9 mmol/L — ABNORMAL HIGH (ref 3.5–5.2)
Sodium: 135 mmol/L (ref 134–144)
Total Protein: 6.9 g/dL (ref 6.0–8.5)

## 2018-09-10 LAB — HEMOGLOBIN A1C
Est. average glucose Bld gHb Est-mCnc: 137 mg/dL
Hgb A1c MFr Bld: 6.4 % — ABNORMAL HIGH (ref 4.8–5.6)

## 2018-09-10 LAB — TSH: TSH: 2.5 u[IU]/mL (ref 0.450–4.500)

## 2018-09-10 LAB — LIPID PANEL
Chol/HDL Ratio: 2.8 ratio (ref 0.0–4.4)
Cholesterol, Total: 162 mg/dL (ref 100–199)
HDL: 57 mg/dL (ref >39)
LDL Calculated: 77 mg/dL (ref 0–99)
Triglycerides: 142 mg/dL (ref 0–149)
VLDL Cholesterol Cal: 28 mg/dL (ref 5–40)

## 2018-09-10 LAB — CBC WITH DIFFERENTIAL/PLATELET
Basophils Absolute: 0.1 10*3/uL (ref 0.0–0.2)
Basos: 1 %
EOS (ABSOLUTE): 0.2 10*3/uL (ref 0.0–0.4)
Eos: 3 %
Hematocrit: 30.6 % — ABNORMAL LOW (ref 34.0–46.6)
Hemoglobin: 10.1 g/dL — ABNORMAL LOW (ref 11.1–15.9)
Immature Grans (Abs): 0 10*3/uL (ref 0.0–0.1)
Immature Granulocytes: 0 %
Lymphocytes Absolute: 1.6 10*3/uL (ref 0.7–3.1)
Lymphs: 22 %
MCH: 29.2 pg (ref 26.6–33.0)
MCHC: 33 g/dL (ref 31.5–35.7)
MCV: 88 fL (ref 79–97)
Monocytes Absolute: 0.7 10*3/uL (ref 0.1–0.9)
Monocytes: 10 %
Neutrophils Absolute: 4.7 10*3/uL (ref 1.4–7.0)
Neutrophils: 64 %
Platelets: 299 10*3/uL (ref 150–450)
RBC: 3.46 x10E6/uL — ABNORMAL LOW (ref 3.77–5.28)
RDW: 11.9 % (ref 11.7–15.4)
WBC: 7.2 10*3/uL (ref 3.4–10.8)

## 2018-09-11 ENCOUNTER — Other Ambulatory Visit: Payer: Self-pay

## 2018-09-11 DIAGNOSIS — N183 Chronic kidney disease, stage 3 unspecified: Secondary | ICD-10-CM

## 2018-09-18 DIAGNOSIS — I10 Essential (primary) hypertension: Secondary | ICD-10-CM | POA: Diagnosis not present

## 2018-09-18 DIAGNOSIS — E1122 Type 2 diabetes mellitus with diabetic chronic kidney disease: Secondary | ICD-10-CM | POA: Diagnosis not present

## 2018-09-18 DIAGNOSIS — N183 Chronic kidney disease, stage 3 (moderate): Secondary | ICD-10-CM | POA: Diagnosis not present

## 2018-09-18 DIAGNOSIS — E875 Hyperkalemia: Secondary | ICD-10-CM | POA: Diagnosis not present

## 2018-09-19 ENCOUNTER — Other Ambulatory Visit: Payer: Self-pay | Admitting: Internal Medicine

## 2018-09-19 DIAGNOSIS — F32A Depression, unspecified: Secondary | ICD-10-CM

## 2018-09-19 DIAGNOSIS — F329 Major depressive disorder, single episode, unspecified: Secondary | ICD-10-CM

## 2018-09-25 ENCOUNTER — Other Ambulatory Visit: Payer: Self-pay | Admitting: Nephrology

## 2018-09-25 ENCOUNTER — Other Ambulatory Visit (HOSPITAL_COMMUNITY): Payer: Self-pay | Admitting: Nephrology

## 2018-09-25 DIAGNOSIS — N183 Chronic kidney disease, stage 3 unspecified: Secondary | ICD-10-CM

## 2018-10-06 ENCOUNTER — Ambulatory Visit: Payer: Medicare HMO

## 2018-10-07 DIAGNOSIS — G548 Other nerve root and plexus disorders: Secondary | ICD-10-CM | POA: Diagnosis not present

## 2018-10-07 DIAGNOSIS — L578 Other skin changes due to chronic exposure to nonionizing radiation: Secondary | ICD-10-CM | POA: Diagnosis not present

## 2018-10-07 DIAGNOSIS — L570 Actinic keratosis: Secondary | ICD-10-CM | POA: Diagnosis not present

## 2018-10-08 ENCOUNTER — Ambulatory Visit
Admission: RE | Admit: 2018-10-08 | Discharge: 2018-10-08 | Disposition: A | Discharge status: Home / Self Care | Payer: Medicare HMO | Source: Ambulatory Visit | Attending: Nephrology | Admitting: Nephrology

## 2018-10-08 ENCOUNTER — Other Ambulatory Visit: Payer: Self-pay

## 2018-10-08 DIAGNOSIS — N183 Chronic kidney disease, stage 3 unspecified: Secondary | ICD-10-CM

## 2018-10-14 DIAGNOSIS — H43812 Vitreous degeneration, left eye: Secondary | ICD-10-CM | POA: Diagnosis not present

## 2018-10-23 DIAGNOSIS — E1122 Type 2 diabetes mellitus with diabetic chronic kidney disease: Secondary | ICD-10-CM | POA: Diagnosis not present

## 2018-10-23 DIAGNOSIS — D631 Anemia in chronic kidney disease: Secondary | ICD-10-CM | POA: Diagnosis not present

## 2018-10-23 DIAGNOSIS — N2581 Secondary hyperparathyroidism of renal origin: Secondary | ICD-10-CM | POA: Diagnosis not present

## 2018-10-23 DIAGNOSIS — N1832 Chronic kidney disease, stage 3b: Secondary | ICD-10-CM | POA: Diagnosis not present

## 2018-11-03 DIAGNOSIS — H43813 Vitreous degeneration, bilateral: Secondary | ICD-10-CM | POA: Diagnosis not present

## 2018-11-03 DIAGNOSIS — H35372 Puckering of macula, left eye: Secondary | ICD-10-CM | POA: Diagnosis not present

## 2018-11-03 DIAGNOSIS — H35033 Hypertensive retinopathy, bilateral: Secondary | ICD-10-CM | POA: Diagnosis not present

## 2018-11-03 DIAGNOSIS — H2513 Age-related nuclear cataract, bilateral: Secondary | ICD-10-CM | POA: Diagnosis not present

## 2018-11-07 DIAGNOSIS — E119 Type 2 diabetes mellitus without complications: Secondary | ICD-10-CM | POA: Diagnosis not present

## 2018-11-10 ENCOUNTER — Other Ambulatory Visit: Payer: Self-pay

## 2018-11-10 NOTE — Progress Notes (Signed)
[facility]  [address] [place], [place] [address] Phone: [phone]  Fax: [phone]   Clinic Day:  11/11/2018  Referring physician: Anthonette Legato, MD  Chief Complaint: Misty Villarreal is a 68 y.o. female with stage IIIB chronic kidney disease, anemia, and hypogammaglobulinemia who is referred in consultation with Dr. Anthonette Legato for assessment and management.   HPI:  The patient has a history of stage IIIB chronic kidney disease. GFR has been steadily decreasing over the last three years.  Creatinine was 1.51 on 09/18/2018 and 1.36 on 10/23/2018.  Estimated GFR was 39 ml/min on 10/23/2018.  Albumin was 4.0 with a calcium of 9.1 on 10/23/2018.  Renal ultrasound on 10/08/2018 revealed no hydronephrosis. UPEP and ANA were negative on 09/18/2018.  SPEP revealed no M-spike, but it did show hypogammaglobulinemia.   Labs have been followed: 09/18/2018:  hematocrit 30.9, hemoglobin 9.9, MCV 91.2, platelets 290,000, and WBC 7,700. 10/23/2018:  hematocrit 29.7, hemoglobin 9.8, MCV 88.2, platelets 309,000, and WBC 7,200.  Iron saturation 18% with a TIBC 357.  Her diet is fair.  She is "not cooking".  She will eat a bowl of cereal, cheese and crackers.  She doesn't eat any "big salads or vegetables".  Symptomatically, she feels great.  She denies any issues with infections.  She denies any fevers, sweats or weight loss.  She denies any bone pain.   Past Medical History:  Diagnosis Date  . Abnormal glandular Papanicolaou smear of cervix 09/01/2014   Normal Pap 2012   . Chronic kidney disease   . Diabetes mellitus without complication ([place])   . Hyperlipidemia   . Peripheral vascular disease ([place])   . Slurred speech 12/03/2016    Past Surgical History:  Procedure Laterality Date  . ANGIOPLASTY / STENTING FEMORAL Left 2014, 2013  . BRAIN MENINGIOMA EXCISION  1988  . BREAST BIOPSY Right 1989   benign  . CARPAL TUNNEL RELEASE Left 1980  .  CHOLECYSTECTOMY  1987  . COLONOSCOPY WITH PROPOFOL N/A 05/08/2017   Procedure: COLONOSCOPY WITH PROPOFOL;  Surgeon: Jonathon Bellows, MD;  Location: [facility] ENDOSCOPY;  Service: Gastroenterology;  Laterality: N/A;  . KNEE ARTHROPLASTY Right 2009  . LOWER EXTREMITY ANGIOGRAPHY Left 07/21/2018   Procedure: LOWER EXTREMITY ANGIOGRAPHY;  Surgeon: Katha Cabal, MD;  Location: [facility];  Service: Cardiovascular;  Laterality: Left;    Family History  Problem Relation Age of Onset  . Diabetes Mother   . Heart failure Father   . Diabetes Father   . Breast cancer Neg Hx     Social History:  reports that she quit smoking about 5 years ago. She has never used smokeless tobacco. She reports current alcohol use of about 2.0 standard drinks of alcohol per week. She reports that she does not use drugs.  She previously smoked a pack of cigarettes a day for 40 years.  She drinks red wine occasionally.  He denies any exposure to radiation or toxins.  She is retired.  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  Her daughter's name is [name].  Her husband, [name], died in 07/13/2018.  She lives in [place].  She is alone today.  Allergies:  Allergies  Allergen Reactions  . Augmentin [Amoxicillin-Pot Clavulanate] Diarrhea  . Tetracycline     Other reaction(s): emesis  . Cefaclor Rash  . Cephalexin Rash  . Sulfa Antibiotics Rash    Other reaction(s): emesis    Current Medications: Current Outpatient Medications  Medication  Sig Dispense Refill  . aspirin 81 MG chewable tablet Chew 1 tablet by mouth daily.     Marland Kitchen atorvastatin (LIPITOR) 40 MG tablet Take 1 tablet (40 mg total) by mouth daily. 90 tablet 1  . clopidogrel (PLAVIX) 75 MG tablet TAKE 1 TABLET DAILY 90 tablet 4  . fexofenadine (ALLEGRA) 180 MG tablet Take 180 mg by mouth daily.     Marland Kitchen gabapentin (NEURONTIN) 100 MG capsule Take 1-3 capsules (100-300 mg total) by mouth at bedtime. 270 capsule 0  . lisinopril  (PRINIVIL,ZESTRIL) 20 MG tablet Take 1 tablet (20 mg total) by mouth daily. 90 tablet 3  . Magnesium 250 MG TABS Take 500 mg by mouth at bedtime.    . meclizine (ANTIVERT) 12.5 MG tablet Take 1 tablet (12.5 mg total) by mouth 2 (two) times daily as needed for dizziness. 30 tablet 5  . metFORMIN (GLUCOPHAGE) 500 MG tablet TAKE 1 TABLET TWICE A DAY 180 tablet 3  . Multiple Vitamin tablet Take 1 tablet by mouth daily.     Marland Kitchen rOPINIRole (REQUIP) 0.5 MG tablet TAKE 1 TO 3 TABLETS(0.5 TO 1.5 MG) BY MOUTH AT BEDTIME (Patient taking differently: Take 1.5 mg by mouth at bedtime. Patient said she takes 2-3 nightly) 270 tablet 1  . sertraline (ZOLOFT) 50 MG tablet TAKE 1 TABLET DAILY 90 tablet 3   No current facility-administered medications for this visit.     Review of Systems  Constitutional: Negative.  Negative for chills, diaphoresis, fever, malaise/fatigue and weight loss.       Feels "great".  HENT: Negative for congestion, ear pain, nosebleeds, sinus pain and sore throat.        Runny nose secondary to allergies.  Eyes: Negative for blurred vision, double vision, photophobia and pain.       Floaters.  Respiratory: Negative.  Negative for cough, sputum production, shortness of breath and wheezing.   Cardiovascular: Positive for leg swelling (occasional if up a long time). Negative for chest pain, palpitations and orthopnea.       Multiple blockages in legs, on Plavix.  Gastrointestinal: Positive for diarrhea (loose stools; taking Lipitor QOD helped). Negative for abdominal pain, blood in stool, constipation, melena, nausea and vomiting.  Genitourinary: Negative.  Negative for dysuria, frequency, hematuria and urgency.  Musculoskeletal: Positive for joint pain (pain behind knee). Negative for back pain, myalgias and neck pain.  Skin: Negative.  Negative for itching and rash.  Neurological: Negative.  Negative for dizziness, tingling, tremors, sensory change, speech change, focal weakness, weakness  and headaches.  Endo/Heme/Allergies: Positive for environmental allergies. Does not bruise/bleed easily.  Psychiatric/Behavioral: Negative.  Negative for depression, hallucinations and memory loss. The patient is not nervous/anxious and does not have insomnia.    Performance status (ECOG): 0  Vitals Blood pressure (!) 150/73, pulse 76, temperature 98 F (36.7 C), temperature source Tympanic, resp. rate 16, height 5\' 2"  (1.575 m), weight 177 lb 0.5 oz (80.3 kg), SpO2 100 %.   Physical Exam  Constitutional: She is oriented to person, place, and time. She appears well-developed and well-nourished. No distress.  HENT:  Head: Normocephalic.  Mouth/Throat: No oropharyngeal exudate.  Short gray hair.  Mask.  Eyes: Pupils are equal, round, and reactive to light. Conjunctivae are normal. No scleral icterus.  Glasses.  Blue eyes.  Neck: Normal range of motion. Neck supple. No JVD present.  Cardiovascular: Normal rate and normal heart sounds. Exam reveals no gallop.  No murmur heard. Pulmonary/Chest: Effort normal and breath sounds normal.  No respiratory distress. She has no wheezes. She has no rales.  Abdominal: Soft. Bowel sounds are normal. She exhibits no distension and no mass. There is no abdominal tenderness. There is no rebound and no guarding.  Musculoskeletal: Normal range of motion.        General: No edema.  Lymphadenopathy:       Head (right side): No preauricular, no posterior auricular and no occipital adenopathy present.       Head (left side): No preauricular, no posterior auricular and no occipital adenopathy present.    She has no cervical adenopathy.    She has no axillary adenopathy.       Right: No inguinal and no supraclavicular adenopathy present.       Left: No inguinal and no supraclavicular adenopathy present.  Neurological: She is alert and oriented to person, place, and time.  Skin: Skin is warm and dry. No rash noted. She is not diaphoretic. No erythema. No pallor.   Psychiatric: She has a normal mood and affect. Her behavior is normal. Judgment and thought content normal.  Nursing note and vitals reviewed.   No visits with results within 3 Day(s) from this visit.  Latest known visit with results is:  Office Visit on 09/09/2018  Component Date Value Ref Range Status  . WBC 09/09/2018 7.2  3.4 - 10.8 x10E3/uL Final  . RBC 09/09/2018 3.46* 3.77 - 5.28 x10E6/uL Final  . Hemoglobin 09/09/2018 10.1* 11.1 - 15.9 g/dL Final  . Hematocrit 09/09/2018 30.6* 34.0 - 46.6 % Final  . MCV 09/09/2018 88  79 - 97 fL Final  . MCH 09/09/2018 29.2  26.6 - 33.0 pg Final  . MCHC 09/09/2018 33.0  31.5 - 35.7 g/dL Final  . RDW 09/09/2018 11.9  11.7 - 15.4 % Final  . Platelets 09/09/2018 299  150 - 450 x10E3/uL Final  . Neutrophils 09/09/2018 64  Not Estab. % Final  . Lymphs 09/09/2018 22  Not Estab. % Final  . Monocytes 09/09/2018 10  Not Estab. % Final  . Eos 09/09/2018 3  Not Estab. % Final  . Basos 09/09/2018 1  Not Estab. % Final  . Neutrophils Absolute 09/09/2018 4.7  1.4 - 7.0 x10E3/uL Final  . Lymphocytes Absolute 09/09/2018 1.6  0.7 - 3.1 x10E3/uL Final  . Monocytes Absolute 09/09/2018 0.7  0.1 - 0.9 x10E3/uL Final  . EOS (ABSOLUTE) 09/09/2018 0.2  0.0 - 0.4 x10E3/uL Final  . Basophils Absolute 09/09/2018 0.1  0.0 - 0.2 x10E3/uL Final  . Immature Granulocytes 09/09/2018 0  Not Estab. % Final  . Immature Grans (Abs) 09/09/2018 0.0  0.0 - 0.1 x10E3/uL Final  . Glucose 09/09/2018 118* 65 - 99 mg/dL Final  . BUN 09/09/2018 47* 8 - 27 mg/dL Final  . Creatinine, Ser 09/09/2018 1.64* 0.57 - 1.00 mg/dL Final  . GFR calc non Af Amer 09/09/2018 32* >59 mL/min/1.73 Final  . GFR calc Af Amer 09/09/2018 37* >59 mL/min/1.73 Final  . BUN/Creatinine Ratio 09/09/2018 29* 12 - 28 Final  . Sodium 09/09/2018 135  134 - 144 mmol/L Final  . Potassium 09/09/2018 5.9* 3.5 - 5.2 mmol/L Final  . Chloride 09/09/2018 100  96 - 106 mmol/L Final  . CO2 09/09/2018 21  20 - 29 mmol/L  Final  . Calcium 09/09/2018 9.6  8.7 - 10.3 mg/dL Final  . Total Protein 09/09/2018 6.9  6.0 - 8.5 g/dL Final  . Albumin 09/09/2018 4.5  3.8 - 4.8 g/dL Final  . Globulin, Total 09/09/2018  2.4  1.5 - 4.5 g/dL Final  . Albumin/Globulin Ratio 09/09/2018 1.9  1.2 - 2.2 Final  . Bilirubin Total 09/09/2018 0.7  0.0 - 1.2 mg/dL Final  . Alkaline Phosphatase 09/09/2018 87  39 - 117 IU/L Final  . AST 09/09/2018 16  0 - 40 IU/L Final  . ALT 09/09/2018 11  0 - 32 IU/L Final  . Hgb A1c MFr Bld 09/09/2018 6.4* 4.8 - 5.6 % Final   Comment:          Prediabetes: 5.7 - 6.4          Diabetes: >6.4          Glycemic control for adults with diabetes: <7.0   . Est. average glucose Bld gHb Est-m* 09/09/2018 137  mg/dL Final  . Cholesterol, Total 09/09/2018 162  100 - 199 mg/dL Final  . Triglycerides 09/09/2018 142  0 - 149 mg/dL Final  . HDL 09/09/2018 57  >39 mg/dL Final  . VLDL Cholesterol Cal 09/09/2018 28  5 - 40 mg/dL Final  . LDL Calculated 09/09/2018 77  0 - 99 mg/dL Final  . Chol/HDL Ratio 09/09/2018 2.8  0.0 - 4.4 ratio Final   Comment:                                   T. Chol/HDL Ratio                                             Men  Women                               1/2 Avg.Risk  3.4    3.3                                   Avg.Risk  5.0    4.4                                2X Avg.Risk  9.6    7.1                                3X Avg.Risk 23.4   11.0   . Color, UA 09/09/2018 straw   Final  . Clarity, UA 09/09/2018 c;ear   Final  . Glucose, UA 09/09/2018 Negative  Negative Final  . Bilirubin, UA 09/09/2018 neg   Final  . Ketones, UA 09/09/2018 neg   Final  . Spec Grav, UA 09/09/2018 1.015  1.010 - 1.025 Final  . Blood, UA 09/09/2018 neg   Final  . pH, UA 09/09/2018 5.0  5.0 - 8.0 Final  . Protein, UA 09/09/2018 Negative  Negative Final  . Urobilinogen, UA 09/09/2018 0.2  0.2 or 1.0 E.U./dL Final  . Nitrite, UA 09/09/2018 neg   Final  . Leukocytes, UA 09/09/2018 Negative  Negative  Final  . Appearance 09/09/2018 clear   Final  . Odor 09/09/2018 none   Final  . TSH 09/09/2018 2.500  0.450 - 4.500 uIU/mL Final    Assessment:  Kennie Rupar is a  68 y.o. female with stage IIIB chronic kidney disease and a normocytic anemia and hypogammaglobulinemia.  Diet is fair.  Labs on 09/18/2018 revealed no M-spike but hypogammaglobulinemia.  Random UPEP and ANA were negative. Labs on 10/23/2018 revealed a hematocrit 29.7, hemoglobin 9.8, MCV 88.2, platelets 309,000, and WBC 7,200.  Albumen and calcium were normal.  Iron saturation 18% with a TIBC 357.  She has stage IIIB chronic kidney disease felt secondary to diabetes.  Creatinine was 1.36 on 10/23/2018 (CrCl 39 ml/min).    Colonoscopy on 05/08/2017 revealed diverticulosis and non-bleeding internal hemorrhoids.  Symptomatically, she feels "great".  She denies any B symptoms, bone pain, or infections.  Exam is unremarkable.  Plan: 1.   Labs today:  CBC with diff, ferritin, B12, folate, TSH, myeloma panel, FLCA. 2.   24 hour urine for UPEP and free light chains. 3.   Normocytic anemia  Etiology unclear.  Diet is fair.    Patient has renal insufficiency.   Discuss Retacrit if work-up unrevealing to maintain a hemoglobin > 10.   Potential side effects reviewed.   Information provided.   Preauth Retacrit.  Discuss work-up. 4.   Hypogammaglobulinemia  Etiology unclear.  Patient denies any issues with infections.  Discuss work-up. 5.  RTC in 2 weeks for MD assessment (Doximity), review of work-up and discussion regarding direction of therapy.  I discussed the assessment and treatment plan with the patient.  The patient was provided an opportunity to ask questions and all were answered.  The patient agreed with the plan and demonstrated an understanding of the instructions.  The patient was advised to call back if the symptoms worsen or if the condition fails to improve as anticipated.   Melissa C. Mike Gip, MD, PhD     11/11/2018, 10:54 AM

## 2018-11-11 ENCOUNTER — Inpatient Hospital Stay: Payer: Medicare HMO

## 2018-11-11 ENCOUNTER — Encounter: Payer: Self-pay | Admitting: Hematology and Oncology

## 2018-11-11 ENCOUNTER — Inpatient Hospital Stay: Payer: Medicare HMO | Attending: Hematology and Oncology | Admitting: Hematology and Oncology

## 2018-11-11 VITALS — BP 150/73 | HR 76 | Temp 98.0°F | Resp 16 | Ht 62.0 in | Wt 177.0 lb

## 2018-11-11 DIAGNOSIS — D649 Anemia, unspecified: Secondary | ICD-10-CM

## 2018-11-11 DIAGNOSIS — D801 Nonfamilial hypogammaglobulinemia: Secondary | ICD-10-CM

## 2018-11-11 DIAGNOSIS — E1122 Type 2 diabetes mellitus with diabetic chronic kidney disease: Secondary | ICD-10-CM | POA: Diagnosis not present

## 2018-11-11 DIAGNOSIS — N1832 Chronic kidney disease, stage 3b: Secondary | ICD-10-CM | POA: Diagnosis not present

## 2018-11-11 LAB — CBC WITH DIFFERENTIAL/PLATELET
Abs Immature Granulocytes: 0.04 10*3/uL (ref 0.00–0.07)
Basophils Absolute: 0.1 10*3/uL (ref 0.0–0.1)
Basophils Relative: 1 %
Eosinophils Absolute: 0.2 10*3/uL (ref 0.0–0.5)
Eosinophils Relative: 3 %
HCT: 32.2 % — ABNORMAL LOW (ref 36.0–46.0)
Hemoglobin: 10.7 g/dL — ABNORMAL LOW (ref 12.0–15.0)
Immature Granulocytes: 1 %
Lymphocytes Relative: 28 %
Lymphs Abs: 1.7 10*3/uL (ref 0.7–4.0)
MCH: 29.6 pg (ref 26.0–34.0)
MCHC: 33.2 g/dL (ref 30.0–36.0)
MCV: 89.2 fL (ref 80.0–100.0)
Monocytes Absolute: 0.6 10*3/uL (ref 0.1–1.0)
Monocytes Relative: 9 %
Neutro Abs: 3.5 10*3/uL (ref 1.7–7.7)
Neutrophils Relative %: 58 %
Platelets: 303 10*3/uL (ref 150–400)
RBC: 3.61 MIL/uL — ABNORMAL LOW (ref 3.87–5.11)
RDW: 12.5 % (ref 11.5–15.5)
WBC: 6 10*3/uL (ref 4.0–10.5)
nRBC: 0 % (ref 0.0–0.2)

## 2018-11-11 LAB — FOLATE: Folate: 11.4 ng/mL (ref >5.9)

## 2018-11-11 LAB — TSH: TSH: 4.385 u[IU]/mL (ref 0.350–4.500)

## 2018-11-11 LAB — FERRITIN: Ferritin: 11 ng/mL (ref 11–307)

## 2018-11-11 LAB — VITAMIN B12: Vitamin B-12: 104 pg/mL — ABNORMAL LOW (ref 180–914)

## 2018-11-11 NOTE — Progress Notes (Signed)
Patient here as new patient referral from Dr. Holley Raring d/t Anemia. Denies any concerns.

## 2018-11-11 NOTE — Patient Instructions (Signed)

## 2018-11-12 ENCOUNTER — Telehealth: Payer: Self-pay

## 2018-11-12 DIAGNOSIS — E538 Deficiency of other specified B group vitamins: Secondary | ICD-10-CM

## 2018-11-12 LAB — KAPPA/LAMBDA LIGHT CHAINS
Kappa free light chain: 76.4 mg/L — ABNORMAL HIGH (ref 3.3–19.4)
Kappa, lambda light chain ratio: 1.18 (ref 0.26–1.65)
Lambda free light chains: 64.9 mg/L — ABNORMAL HIGH (ref 5.7–26.3)

## 2018-11-12 NOTE — Telephone Encounter (Signed)
-----   Message from Lequita Asal, MD sent at 11/12/2018  2:02 AM EDT ----- Regarding: Please call patient  B12 extremely low.  Begin B12 1000 mcg po q day.  Plan to check level in 1 month.    If level remains low, will begin B12 injections.  M  ----- Message ----- From: Buel Ream, Lab In [place] Sent: 11/11/2018  11:44 AM EDT To: Lequita Asal, MD

## 2018-11-12 NOTE — Telephone Encounter (Signed)
Informed patient of low B12 level. Advised that Dr. Mike Gip would like for her to start Oral Vitamin B12 1000 MCG daily and recheck B12 level in 1 month. Informed patient if levels are still low at this time, patient will need to start Inj. Per Dr. Mike Gip. Patient verbalizes understanding and denies any further questions or concerns.

## 2018-11-13 LAB — MULTIPLE MYELOMA PANEL, SERUM
Albumin SerPl Elph-Mcnc: 3.8 g/dL (ref 2.9–4.4)
Albumin/Glob SerPl: 1.5 (ref 0.7–1.7)
Alpha 1: 0.2 g/dL (ref 0.0–0.4)
Alpha2 Glob SerPl Elph-Mcnc: 0.8 g/dL (ref 0.4–1.0)
B-Globulin SerPl Elph-Mcnc: 1.1 g/dL (ref 0.7–1.3)
Gamma Glob SerPl Elph-Mcnc: 0.6 g/dL (ref 0.4–1.8)
Globulin, Total: 2.7 g/dL (ref 2.2–3.9)
IgA: 233 mg/dL (ref 87–352)
IgG (Immunoglobin G), Serum: 810 mg/dL (ref 586–1602)
IgM (Immunoglobulin M), Srm: 78 mg/dL (ref 26–217)
Total Protein ELP: 6.5 g/dL (ref 6.0–8.5)

## 2018-11-16 ENCOUNTER — Ambulatory Visit (INDEPENDENT_AMBULATORY_CARE_PROVIDER_SITE_OTHER): Payer: Medicare HMO

## 2018-11-16 ENCOUNTER — Encounter (INDEPENDENT_AMBULATORY_CARE_PROVIDER_SITE_OTHER): Payer: Self-pay | Admitting: Vascular Surgery

## 2018-11-16 ENCOUNTER — Other Ambulatory Visit: Payer: Self-pay

## 2018-11-16 ENCOUNTER — Ambulatory Visit (INDEPENDENT_AMBULATORY_CARE_PROVIDER_SITE_OTHER): Payer: Medicare HMO | Admitting: Vascular Surgery

## 2018-11-16 VITALS — BP 127/81 | HR 69 | Resp 16 | Wt 177.4 lb

## 2018-11-16 DIAGNOSIS — E1169 Type 2 diabetes mellitus with other specified complication: Secondary | ICD-10-CM | POA: Diagnosis not present

## 2018-11-16 DIAGNOSIS — I739 Peripheral vascular disease, unspecified: Secondary | ICD-10-CM | POA: Diagnosis not present

## 2018-11-16 DIAGNOSIS — E118 Type 2 diabetes mellitus with unspecified complications: Secondary | ICD-10-CM | POA: Diagnosis not present

## 2018-11-16 DIAGNOSIS — E785 Hyperlipidemia, unspecified: Secondary | ICD-10-CM | POA: Diagnosis not present

## 2018-11-16 DIAGNOSIS — I10 Essential (primary) hypertension: Secondary | ICD-10-CM

## 2018-11-16 NOTE — Progress Notes (Signed)
MRN : [id number]  Misty Villarreal is a 68 y.o. (1950-03-31) female who presents with chief complaint of No chief complaint on file. Marland Kitchen  History of Present Illness:   The patient returns to the office for followup and review status post angiogram with intervention on 07/21/2018.   Procedure(s) Performed: 1. Introduction catheter intoleftlower extremity 3rd order catheter placement  2.Contrast injection leftlower extremity for distal runoff  3. Crosser atherectomy of theleftSFA and popliteal arteries 4. Percutaneous transluminal angioplastyleftsuperficial femoral artery and popliteal 5. Star close closurerightcommon femoral arteriotomy  The patient notes improvement in the lower extremity symptoms. No interval shortening of the patient's claudication distance or rest pain symptoms. Previous wounds have now healed.  No new ulcers or wounds have occurred since the last visit.  There have been no significant changes to the patient's overall health care.  The patient denies amaurosis fugax or recent TIA symptoms. There are no recent neurological changes noted. The patient denies history of DVT, PE or superficial thrombophlebitis. The patient denies recent episodes of angina or shortness of breath.   ABI's Rt=0.93 and Lt=0.76 (previous ABI's Rt=0.91and Lt=0.99 ) Duplex ultrasound of the arterial system bilaterally shows the SFA intervention sites are patent No outpatient medications have been marked as taking for the 11/16/18 encounter (Appointment) with Delana Meyer, Dolores Lory, MD.    Past Medical History:  Diagnosis Date  . Abnormal glandular Papanicolaou smear of cervix 09/01/2014   Normal Pap 2012   . Chronic kidney disease   . Diabetes mellitus without complication ([place])   . Hyperlipidemia   . Peripheral vascular disease ([place])   . Slurred speech 12/03/2016    Past Surgical History:  Procedure  Laterality Date  . ANGIOPLASTY / STENTING FEMORAL Left 2014, 2013  . BRAIN MENINGIOMA EXCISION  1988  . BREAST BIOPSY Right 1989   benign  . CARPAL TUNNEL RELEASE Left 1980  . CHOLECYSTECTOMY  1987  . COLONOSCOPY WITH PROPOFOL N/A 05/08/2017   Procedure: COLONOSCOPY WITH PROPOFOL;  Surgeon: Jonathon Bellows, MD;  Location: [facility] ENDOSCOPY;  Service: Gastroenterology;  Laterality: N/A;  . KNEE ARTHROPLASTY Right 2009  . LOWER EXTREMITY ANGIOGRAPHY Left 07/21/2018   Procedure: LOWER EXTREMITY ANGIOGRAPHY;  Surgeon: Katha Cabal, MD;  Location: [place] [facility];  Service: Cardiovascular;  Laterality: Left;    Social History Social History   Tobacco Use  . Smoking status: Former Smoker    Quit date: 04/05/2013    Years since quitting: 5.6  . Smokeless tobacco: Never Used  Substance Use Topics  . Alcohol use: Yes    Alcohol/week: 2.0 standard drinks    Types: 2 Standard drinks or equivalent per week    Comment: occasional  . Drug use: No    Family History Family History  Problem Relation Age of Onset  . Diabetes Mother   . Heart failure Father   . Diabetes Father   . Breast cancer Neg Hx     Allergies  Allergen Reactions  . Augmentin [Amoxicillin-Pot Clavulanate] Diarrhea  . Tetracycline     Other reaction(s): emesis  . Cefaclor Rash  . Cephalexin Rash  . Sulfa Antibiotics Rash    Other reaction(s): emesis     REVIEW OF SYSTEMS (Negative unless checked)  Constitutional: [] Weight loss  [] Fever  [] Chills Cardiac: [] Chest pain   [] Chest pressure   [] Palpitations   [] Shortness of breath when laying flat   [] Shortness of breath with exertion. Vascular:  [x] Pain in legs with walking   [x] Pain in  legs at rest  [] History of DVT   [] Phlebitis   [] Swelling in legs   [] Varicose veins   [] Non-healing ulcers Pulmonary:   [] Uses home oxygen   [] Productive cough   [] Hemoptysis   [] Wheeze  [] COPD   [] Asthma Neurologic:  [] Dizziness   [] Seizures   [] History of stroke   [] History  of TIA  [] Aphasia   [] Vissual changes   [] Weakness or numbness in arm   [] Weakness or numbness in leg Musculoskeletal:   [] Joint swelling   [x] Joint pain   [x] Low back pain Hematologic:  [] Easy bruising  [] Easy bleeding   [] Hypercoagulable state   [] Anemic Gastrointestinal:  [] Diarrhea   [] Vomiting  [] Gastroesophageal reflux/heartburn   [] Difficulty swallowing. Genitourinary:  [] Chronic kidney disease   [] Difficult urination  [] Frequent urination   [] Blood in urine Skin:  [] Rashes   [] Ulcers  Psychological:  [] History of anxiety   []  History of major depression.  Physical Examination  There were no vitals filed for this visit. There is no height or weight on file to calculate BMI. Gen: WD/WN, NAD Head: [place]/AT, No temporalis wasting.  Ear/Nose/Throat: Hearing grossly intact, nares w/o erythema or drainage Eyes: PER, EOMI, sclera nonicteric.  Neck: Supple, no large masses.   Pulmonary:  Good air movement, no audible wheezing bilaterally, no use of accessory muscles.  Cardiac: RRR, no JVD Vascular:  Vessel Right Left  Radial Palpable Palpable  PT Not Palpable Not Palpable  DP Not Palpable Not Palpable  Gastrointestinal: Non-distended. No guarding/no peritoneal signs.  Musculoskeletal: M/S 5/5 throughout.  No deformity or atrophy.  Neurologic: CN 2-12 intact. Symmetrical.  Speech is fluent. Motor exam as listed above. Psychiatric: Judgment intact, Mood & affect appropriate for pt's clinical situation. Dermatologic: No rashes or ulcers noted.  No changes consistent with cellulitis. Lymph : No lichenification or skin changes of chronic lymphedema.  CBC Lab Results  Component Value Date   WBC 6.0 11/11/2018   HGB 10.7 (L) 11/11/2018   HCT 32.2 (L) 11/11/2018   MCV 89.2 11/11/2018   PLT 303 11/11/2018    BMET    Component Value Date/Time   NA 135 09/09/2018 1124   NA 139 04/27/2013 0713   K 5.9 (H) 09/09/2018 1124   K 4.2 04/27/2013 0713   CL 100 09/09/2018 1124   CL 108 (H)  04/27/2013 0713   CO2 21 09/09/2018 1124   CO2 25 04/27/2013 0713   GLUCOSE 118 (H) 09/09/2018 1124   GLUCOSE 146 (H) 12/03/2016 1625   GLUCOSE 137 (H) 04/27/2013 0713   BUN 47 (H) 09/09/2018 1124   BUN 21 (H) 04/27/2013 0713   CREATININE 1.64 (H) 09/09/2018 1124   CREATININE 1.28 04/27/2013 0713   CALCIUM 9.6 09/09/2018 1124   CALCIUM 9.3 04/27/2013 0713   GFRNONAA 32 (L) 09/09/2018 1124   GFRNONAA 44 (L) 04/27/2013 0713   GFRAA 37 (L) 09/09/2018 1124   GFRAA 52 (L) 04/27/2013 0713   CrCl cannot be calculated (Patient's most recent lab result is older than the maximum 21 days allowed.).  COAG Lab Results  Component Value Date   INR 0.85 12/03/2016    Radiology No results found.   Assessment/Plan 1. PAD (peripheral artery disease) (HCC)  Recommend:  The patient has evidence of atherosclerosis of the lower extremities with claudication.  The patient does not voice lifestyle limiting changes at this point in time.  Noninvasive studies do not suggest clinically significant change.  No invasive studies, angiography or surgery at this time The patient should  continue walking and begin a more formal exercise program.  The patient should continue antiplatelet therapy and aggressive treatment of the lipid abnormalities  No changes in the patient's medications at this time  The patient should continue wearing graduated compression socks 10-15 mmHg strength to control the mild edema.   - VAS [place] LOWER EXTREMITY ARTERIAL DUPLEX; Future - VAS [place] ABI WITH/WO TBI; Future  2. Essential (primary) hypertension Continue antihypertensive medications as already ordered, these medications have been reviewed and there are no changes at this time.   3. Hyperlipidemia associated with type 2 diabetes mellitus ([place]) Continue statin as ordered and reviewed, no changes at this time  4. Type II diabetes mellitus with complication (HCC) Continue hypoglycemic medications as already ordered,  these medications have been reviewed and there are no changes at this time.  Hgb A1C to be monitored as already arranged by primary service    Hortencia Pilar, MD  11/16/2018 8:29 AM

## 2018-11-18 ENCOUNTER — Other Ambulatory Visit: Payer: Self-pay

## 2018-11-18 DIAGNOSIS — E1122 Type 2 diabetes mellitus with diabetic chronic kidney disease: Secondary | ICD-10-CM | POA: Diagnosis not present

## 2018-11-18 DIAGNOSIS — D801 Nonfamilial hypogammaglobulinemia: Secondary | ICD-10-CM

## 2018-11-18 DIAGNOSIS — D649 Anemia, unspecified: Secondary | ICD-10-CM | POA: Diagnosis not present

## 2018-11-18 DIAGNOSIS — N1832 Chronic kidney disease, stage 3b: Secondary | ICD-10-CM | POA: Diagnosis not present

## 2018-11-19 ENCOUNTER — Other Ambulatory Visit: Payer: Self-pay | Admitting: Internal Medicine

## 2018-11-19 DIAGNOSIS — G2581 Restless legs syndrome: Secondary | ICD-10-CM

## 2018-11-19 LAB — FREE K+L LT CHAINS,QN,UR
Free Kappa Lt Chains,Ur: 20.53 mg/L (ref 0.63–113.79)
Free Kappa/Lambda Ratio: 8.66 (ref 1.03–31.76)
Free Lambda Lt Chains,Ur: 2.37 mg/L (ref 0.47–11.77)
Total Volume: 1100

## 2018-11-23 LAB — UPEP/TP, 24-HR URINE
Albumin, U: 100 %
Alpha 1, Urine: 0 %
Alpha 2, Urine: 0 %
Beta, Urine: 0 %
Gamma Globulin, Urine: 0 %
Total Protein, Urine-Ur/day: <44 mg/24 hr (ref 30–150)
Total Protein, Urine: <4 mg/dL
Total Volume: 1100

## 2018-11-24 NOTE — Progress Notes (Signed)
[facility]  [address] [place],  [address] Phone: [phone]  Fax: [phone]   Telemedicine Office Visit:  11/26/2018  Referring physician: Glean Hess, MD  I connected with Edgewood Overacker on 11/26/2018 at 3:56 PM by videoconferencing and verified that I was speaking with the correct person using 2 identifiers. The patient was at home. I discussed the limitations, risk, security and privacy concerns of performing an evaluation and management service by videoconferencing and the availability of in person appointments. I also discussed with the patient that there may be a patient responsible charge related to this service. The patient expressed understanding and agreed to proceed.    Chief Complaint: Misty Villarreal is a 68 y.o. female with stage IIIB chronic kidney disease, anemia, and hypogammaglobulinemia who is seen for review of work-up and discussion regarding direction of therapy.   HPI: The patient was last seen in the hematology clinic on 11/11/2018 for an initial consultation. At that time, she felt "great". She denied any B symptoms, bone pain, or infections. Exam was unremarkable.   Work up included hematocrit 32.2, hemoglobin 10.7, MCV 89.2, platelets 303,000, and WBC 6,000. Ferritin was 11 (low). TSH was 4.385. M spike was 0. Kappa free light chains were 76.4, lambda free light chains 64.9, and ratio 1.18 (normal).  IgG was 810, IgA 233, and IgM 78. Folate was 11.4. Vitamin B-12 104 (extremely low).  24 hour UPEP and free light chains were normal.  Patient's labs showed an extremely low vitamin B-12 (104) on 11/11/2018.  She was advised to start B-12 1,000 mcg daily on 11/12/2018.   Patient was seen by vascular surgeon Dr. Delana Meyer on 11/16/2018. Dr. Delana Meyer decided on no invasive studies, angiography or surgery at that time. Patient continued to wear graduated compression socks 10-15 mmHg strength to control the mild  edema.   During the interim, she has felt "good".  She has not started oral iron. Her diet is poor. She is eating small meals. She notes vegetables "run right through me". She does not like to eat meat. She will eat chicken 1-2 times a week. She likes to eat seafood. She notes her last colonoscopy was in 2019. She denies melena, hematochezia, hematuria or vaginal bleeding. She is taking B-12 1,000 mcg daily.    Past Medical History:  Diagnosis Date  . Abnormal glandular Papanicolaou smear of cervix 09/01/2014   Normal Pap 2012   . Chronic kidney disease   . Diabetes mellitus without complication ([place])   . Hyperlipidemia   . Peripheral vascular disease ([place])   . Slurred speech 12/03/2016    Past Surgical History:  Procedure Laterality Date  . ANGIOPLASTY / STENTING FEMORAL Left 2014, 2013  . BRAIN MENINGIOMA EXCISION  1988  . BREAST BIOPSY Right 1989   benign  . CARPAL TUNNEL RELEASE Left 1980  . CHOLECYSTECTOMY  1987  . COLONOSCOPY WITH PROPOFOL N/A 05/08/2017   Procedure: COLONOSCOPY WITH PROPOFOL;  Surgeon: Jonathon Bellows, MD;  Location: [facility];  Service: Gastroenterology;  Laterality: N/A;  . KNEE ARTHROPLASTY Right 2009  . LOWER EXTREMITY ANGIOGRAPHY Left 07/21/2018   Procedure: LOWER EXTREMITY ANGIOGRAPHY;  Surgeon: Katha Cabal, MD;  Location: [facility];  Service: Cardiovascular;  Laterality: Left;    Family History  Problem Relation Age of Onset  . Diabetes Mother   . Heart failure Father   . Diabetes Father   . Breast cancer Neg Hx     Social  History:  reports that she quit smoking about 5 years ago. She has never used smokeless tobacco. She reports current alcohol use of about 2.0 standard drinks of alcohol per week. She reports that she does not use drugs. She previously smoked a pack of cigarettes a day for 40 years.  She drinks red wine occasionally.  He denies any exposure to radiation or toxins.  She is retired.  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  Her daughter's name is Misty Villarreal.  Her husband, Misty Villarreal, died in 06-22-18.  She lives in [place]. The patient is alone today.  Participants in the patient's visit and their role in the encounter included the patient Misty Budge, RN today. The intake visit was provided Misty Villarreal, Therapist, sports.  Allergies:  Allergies  Allergen Reactions  . Augmentin [Amoxicillin-Pot Clavulanate] Diarrhea  . Tetracycline     Other reaction(s): emesis  . Cefaclor Rash  . Cephalexin Rash  . Sulfa Antibiotics Rash    Other reaction(s): emesis    Current Medications: Current Outpatient Medications  Medication Sig Dispense Refill  . aspirin 81 MG chewable tablet Chew 1 tablet by mouth daily.     Marland Kitchen atorvastatin (LIPITOR) 40 MG tablet Take 1 tablet (40 mg total) by mouth daily. 90 tablet 1  . clopidogrel (PLAVIX) 75 MG tablet TAKE 1 TABLET DAILY 90 tablet 4  . fexofenadine (ALLEGRA) 180 MG tablet Take 180 mg by mouth daily.     Marland Kitchen gabapentin (NEURONTIN) 100 MG capsule Take 1-3 capsules (100-300 mg total) by mouth at bedtime. 270 capsule 0  . lisinopril (PRINIVIL,ZESTRIL) 20 MG tablet Take 1 tablet (20 mg total) by mouth daily. 90 tablet 3  . Magnesium 250 MG TABS Take 500 mg by mouth at bedtime.    . meclizine (ANTIVERT) 12.5 MG tablet Take 1 tablet (12.5 mg total) by mouth 2 (two) times daily as needed for dizziness. 30 tablet 5  . metFORMIN (GLUCOPHAGE) 500 MG tablet TAKE 1 TABLET TWICE A DAY 180 tablet 3  . Multiple Vitamin tablet Take 1 tablet by mouth daily.     Marland Kitchen rOPINIRole (REQUIP) 0.5 MG tablet TAKE 1 TO 3 TABLETS(0.5 TO 1.5 MG) BY MOUTH AT BEDTIME (Patient taking differently: Take 1.5 mg by mouth at bedtime. Patient said she takes 2-3 nightly) 270 tablet 1  . sertraline (ZOLOFT) 50 MG tablet TAKE 1 TABLET DAILY 90 tablet 3   No current facility-administered medications for this visit.     Review of Systems  Constitutional: Negative.  Negative for chills,  diaphoresis, fever, malaise/fatigue and weight loss.       Feels "good".  HENT: Negative for congestion, ear pain, nosebleeds, sinus pain and sore throat.        Runny nose secondary to allergies.  Eyes: Negative.  Negative for blurred vision, double vision, photophobia and pain.  Respiratory: Negative.  Negative for cough, sputum production, shortness of breath and wheezing.   Cardiovascular: Negative for chest pain, palpitations, orthopnea and leg swelling.       Multiple blockages in legs, on Plavix.  Gastrointestinal: Negative for abdominal pain, blood in stool, constipation, diarrhea, melena, nausea and vomiting.       Poor diet. IBS.  No prior EGD.  Genitourinary: Negative.  Negative for dysuria, frequency, hematuria and urgency.  Musculoskeletal: Negative.  Negative for back pain, joint pain, myalgias and neck pain.  Skin: Negative.  Negative for itching and rash.  Neurological: Negative.  Negative for dizziness, tingling, tremors, sensory change,  speech change, focal weakness, weakness and headaches.  Endo/Heme/Allergies: Negative.  Negative for environmental allergies. Does not bruise/bleed easily.  Psychiatric/Behavioral: Negative.  Negative for depression, hallucinations and memory loss. The patient is not nervous/anxious and does not have insomnia.   All other systems reviewed and are negative.  Performance status (ECOG): 0  Physical Exam  Constitutional: She is oriented to person, place, and time. She appears well-developed and well-nourished. No distress.  HENT:  Head: Normocephalic and atraumatic.  Short gray hair.  Eyes: Conjunctivae and EOM are normal. No scleral icterus.  Glasses.  Blue eyes.  Pulmonary/Chest: Breath sounds normal.  Neurological: She is alert and oriented to person, place, and time.  Skin: She is not diaphoretic.  Psychiatric: She has a normal mood and affect. Her behavior is normal. Judgment and thought content normal.  Nursing note and vitals  reviewed.   No visits with results within 3 Day(s) from this visit.  Latest known visit with results is:  Orders Only on 11/18/2018  Component Date Value Ref Range Status  . Free Kappa Lt Chains,Ur 11/18/2018 20.53  0.63 - 113.79 mg/L Final  . Free Lambda Lt Chains,Ur 11/18/2018 2.37  0.47 - 11.77 mg/L Final  . Free Kappa/Lambda Ratio 11/18/2018 8.66  1.03 - 31.76 Final   Comment: (NOTE) Performed At: [facility] [place], [place] [id number] Rush Farmer MD [id number]   . Total Volume 11/18/2018 1,100   Final   Performed at [facility], [address], [place], [place] [address]  . Total Protein, Urine 11/18/2018 <4.0  Not Estab. mg/dL Final  . Total Protein, Urine-Ur/day 11/18/2018 <44  30 - 150 mg/24 hr Final  . Albumin, U 11/18/2018 100.0  % Final  . Alpha 1, Urine 11/18/2018 0.0  % Final  . Alpha 2, Urine 11/18/2018 0.0  % Final  . Beta, Urine 11/18/2018 0.0  % Final  . Gamma Globulin, Urine 11/18/2018 0.0  % Final  . M-spike, % 11/18/2018 Not Observed  Not Observed % Final  . Please Note: 11/18/2018 Comment   Final   Comment: (NOTE) Protein electrophoresis scan will follow via computer, mail, or courier delivery. Performed At: [facility] [place], [place] [id number] Rush Farmer MD [id number]   . Total Volume 11/18/2018 1,100   Final   Performed at [facility], [address], [place], [place] [address]    Assessment:  Misty Villarreal is a 68 y.o. female with stage IIIB chronic kidney disease and a normocytic anemia and hypogammaglobulinemia.  Diet is fair.  Labs on 09/18/2018 revealed no M-spike but hypogammaglobulinemia.  Random UPEP and ANA were negative. Labs on 10/23/2018 revealed a hematocrit 29.7, hemoglobin 9.8, MCV 88.2, platelets 309,000, and WBC 7,200.  Albumen and calcium were normal.  Iron saturation 18% with a TIBC 357.  Work up on 11/11/2018:  hematocrit 32.2,  hemoglobin 10.7, MCV 89.2, platelets 303,000, and WBC 6,000.  Ferritin was 11 (low).  B12 was 104 (low).  Normal studies included: folate, TSH, SPEP, immunoglobulin levels, free light chain ratio, 24 hour UPEP and free light chains.    She has stage IIIB chronic kidney disease felt secondary to diabetes.  Creatinine was 1.36 on 10/23/2018 (CrCl 39 ml/min).    Colonoscopy on 05/08/2017 revealed diverticulosis and non-bleeding internal hemorrhoids.  Symptomatically, she feels good.  Plan: 1.   Normocytic anemia             She has iron deficiency.  Hematocrit 32.2.  Hemoglobin 10.7.   Ferritin 11.             Diet is fair to poor.               Last colonoscopy was in 2019.  She has never had an EGD.  She denies any melena, hematochezia, hematuria or vaginal bleeding.  Discuss trial of oral iron with OJ or vitamin C.   If ineffective, discuss IV iron.   Preauth Venofer. 2.   B12 deficiency  B12 was 104 on 11/11/2018.  She began oral B12 1000 mcg a day on 11/12/2018.  Recheck B12 in 1 month.  B12 goal 400.  If B12 remains low, discuss B12 injections monthly. 3.   Hypogammaglobulinemia             Patient has a history of hypogammaglobulinemia.  Immunoglobulin levels are now normal.  SPEP, FLCA and 24 hour urine for UPEP and free light chains were normal. 4.   RTC in 4 weeks for labs (CBC with diff, ferritin, iron studies, B12, intrinsic factor antibodies, anti-parietal antibodies). 5.   RTC in 8 weeks for MD assessment, labs (CBC, Cr, ferritin- day before), and +/- Venofer.  I discussed the assessment and treatment plan with the patient.  The patient was provided an opportunity to ask questions and all were answered.  The patient agreed with the plan and demonstrated an understanding of the instructions.  The patient was advised to call back if the symptoms worsen or if the condition fails to improve as anticipated.  I provided 16 minutes (3:56 PM - 4:12 PM) of face-to-face time  during this this encounter and > 50% was spent counseling as documented under my assessment and plan.    Lequita Asal, MD, PhD    11/26/2018, 3:49 PM  I, Selena Batten, am acting as scribe for [employer]. Mike Gip, MD, PhD.  I,  C. Mike Gip, MD, have reviewed the above documentation for accuracy and completeness, and I agree with the above.

## 2018-11-25 NOTE — Progress Notes (Signed)
Confirmed Name and DOB. Patient denies any concerns.  

## 2018-11-26 ENCOUNTER — Inpatient Hospital Stay: Payer: Medicare HMO | Attending: Hematology and Oncology | Admitting: Hematology and Oncology

## 2018-11-26 ENCOUNTER — Encounter: Payer: Self-pay | Admitting: Hematology and Oncology

## 2018-11-26 DIAGNOSIS — D509 Iron deficiency anemia, unspecified: Secondary | ICD-10-CM | POA: Insufficient documentation

## 2018-11-26 DIAGNOSIS — E538 Deficiency of other specified B group vitamins: Secondary | ICD-10-CM | POA: Insufficient documentation

## 2018-11-26 DIAGNOSIS — D631 Anemia in chronic kidney disease: Secondary | ICD-10-CM

## 2018-11-26 DIAGNOSIS — D801 Nonfamilial hypogammaglobulinemia: Secondary | ICD-10-CM | POA: Insufficient documentation

## 2018-11-26 DIAGNOSIS — N1832 Chronic kidney disease, stage 3b: Secondary | ICD-10-CM | POA: Diagnosis not present

## 2018-12-04 ENCOUNTER — Encounter: Payer: Self-pay | Admitting: Internal Medicine

## 2018-12-08 DIAGNOSIS — H2513 Age-related nuclear cataract, bilateral: Secondary | ICD-10-CM | POA: Diagnosis not present

## 2018-12-08 DIAGNOSIS — H35372 Puckering of macula, left eye: Secondary | ICD-10-CM | POA: Diagnosis not present

## 2018-12-08 DIAGNOSIS — H43813 Vitreous degeneration, bilateral: Secondary | ICD-10-CM | POA: Diagnosis not present

## 2018-12-08 DIAGNOSIS — H35033 Hypertensive retinopathy, bilateral: Secondary | ICD-10-CM | POA: Diagnosis not present

## 2018-12-11 ENCOUNTER — Other Ambulatory Visit: Payer: Self-pay

## 2018-12-13 NOTE — Progress Notes (Signed)
[facility]  [address] [place], [place] [address] Phone: [phone]  Fax: [phone]   Clinic Day:  12/15/2018  Referring physician: Glean Hess, MD  Chief Complaint: Misty Villarreal is a 68 y.o. female with stage IIIBchronic kidney disease, anemia, and hypogammaglobulinemia who is seen for 3 week assessment.  HPI: The patient was last seen in the hematology clinic on 11/26/2018 for a telemedicine visit. At that time, she felt good. Hematocrit 32.2, hemoglobin 10.7, Ferritin was 11. I discussed a trial of oral iron with OJ and vitamin C. She continued oral B-12 1,000 mcg daily.  Labs on 12/14/2018: Hematocrit 31.8, hemoglobin 10.4, platelets 362,000, WBC 7,300. Ferritin was 24 with an iron saturation of 23% and a TIBC of 392. Vitamin B-12 was 812. Anti-parietal antibody and intrinsic factor antibody are pending.   During the interim, she has felt good. She notes bloating, cramping, and feeling gasy while on oral iron TID. She describes explosive diarrhea while taking oral iron TID. The patient is taking imodium. The patient has been taking 1 tablet of oral iron once daily for week with only mild diarrhea. She is interested in trying IV iron today. She "hates" taking oral iron.  She describes that the bottom of her left foot feels bruised when walking.  It is easier to walk with shoes rather than barefoot. She notes chronic leg pain. She has muscle cramps at night. She notes having a Bakers cyst.   She reports weight gain secondary to drinking protein shakes daily. She is eating better.    Past Medical History:  Diagnosis Date  . Abnormal glandular Papanicolaou smear of cervix 09/01/2014   Normal Pap 2012   . Chronic kidney disease   . Diabetes mellitus without complication ([place])   . Hyperlipidemia   . Peripheral vascular disease ([place])   . Slurred speech 12/03/2016    Past Surgical History:  Procedure Laterality Date  .  ANGIOPLASTY / STENTING FEMORAL Left 2014, 2013  . BRAIN MENINGIOMA EXCISION  1988  . BREAST BIOPSY Right 1989   benign  . CARPAL TUNNEL RELEASE Left 1980  . CHOLECYSTECTOMY  1987  . COLONOSCOPY WITH PROPOFOL N/A 05/08/2017   Procedure: COLONOSCOPY WITH PROPOFOL;  Surgeon: Jonathon Bellows, MD;  Location: [facility] ENDOSCOPY;  Service: Gastroenterology;  Laterality: N/A;  . KNEE ARTHROPLASTY Right 2009  . LOWER EXTREMITY ANGIOGRAPHY Left 07/21/2018   Procedure: LOWER EXTREMITY ANGIOGRAPHY;  Surgeon: Katha Cabal, MD;  Location: [facility];  Service: Cardiovascular;  Laterality: Left;    Family History  Problem Relation Age of Onset  . Diabetes Mother   . Heart failure Father   . Diabetes Father   . Breast cancer Neg Hx     Social History:  reports that she quit smoking about 5 years ago. She has never used smokeless tobacco. She reports current alcohol use of about 2.0 standard drinks of alcohol per week. She reports that she does not use drugs. She previously smoked a pack of cigarettes a day for 40 years. She drinks red wine occasionally.He denies any exposure to radiation or toxins. She is retired. 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 Her daughter's name is [name]. Her husband, [name], died in 07/01/18. She lives in [place]. The patient is alone today.  Allergies:  Allergies  Allergen Reactions  . Augmentin [Amoxicillin-Pot Clavulanate] Diarrhea  . Tetracycline     Other reaction(s): emesis  . Cefaclor Rash  . Cephalexin Rash  .  Sulfa Antibiotics Rash    Other reaction(s): emesis    Current Medications: Current Outpatient Medications  Medication Sig Dispense Refill  . atorvastatin (LIPITOR) 40 MG tablet Take 1 tablet (40 mg total) by mouth daily. 90 tablet 1  . clopidogrel (PLAVIX) 75 MG tablet TAKE 1 TABLET DAILY 90 tablet 4  . Cyanocobalamin (VITAMIN B 12 PO) Take by mouth. 1000mg  daily    . fexofenadine (ALLEGRA) 180 MG tablet Take  180 mg by mouth daily.     Marland Kitchen gabapentin (NEURONTIN) 100 MG capsule Take 1-3 capsules (100-300 mg total) by mouth at bedtime. 270 capsule 0  . Iron, Ferrous Sulfate, 325 (65 Fe) MG TABS Take by mouth.    Marland Kitchen lisinopril (PRINIVIL,ZESTRIL) 20 MG tablet Take 1 tablet (20 mg total) by mouth daily. 90 tablet 3  . meclizine (ANTIVERT) 12.5 MG tablet Take 1 tablet (12.5 mg total) by mouth 2 (two) times daily as needed for dizziness. 30 tablet 5  . metFORMIN (GLUCOPHAGE) 500 MG tablet TAKE 1 TABLET TWICE A DAY 180 tablet 3  . Multiple Vitamin tablet Take 1 tablet by mouth daily.     Marland Kitchen rOPINIRole (REQUIP) 0.5 MG tablet TAKE 1 TO 3 TABLETS(0.5 TO 1.5 MG) BY MOUTH AT BEDTIME (Patient taking differently: Take 1.5 mg by mouth at bedtime. Patient said she takes 2-3 nightly) 270 tablet 1  . sertraline (ZOLOFT) 50 MG tablet TAKE 1 TABLET DAILY 90 tablet 3  . aspirin 81 MG chewable tablet Chew 1 tablet by mouth daily.     . Magnesium 250 MG TABS Take 500 mg by mouth at bedtime.     No current facility-administered medications for this visit.     Review of Systems  Constitutional: Negative.  Negative for chills, diaphoresis, malaise/fatigue and weight loss (up 3 pounds since 11/11/2018).       Feels "ok".  HENT: Negative for congestion, ear pain, hearing loss, nosebleeds, sinus pain and sore throat.        Runny nose secondary to allergies.  Eyes: Negative.  Negative for blurred vision, double vision, photophobia and pain.  Respiratory: Negative.  Negative for cough, sputum production, shortness of breath and wheezing.   Cardiovascular: Negative for chest pain, palpitations, orthopnea and leg swelling.       Multiple blockages in legs on Plavix.  Gastrointestinal: Positive for diarrhea (mild on imodium). Negative for abdominal pain, blood in stool, constipation, melena, nausea and vomiting.       Eating better. Drinking daily protein shakes.  IBS.  No prior EGD.  Genitourinary: Negative.  Negative for dysuria,  frequency, hematuria and urgency.  Musculoskeletal: Positive for myalgias (mucles cramps at night). Negative for back pain, joint pain and neck pain.       Baker's cyst. Chronic leg pain.   Skin: Negative.  Negative for itching and rash.  Neurological: Negative.  Negative for dizziness, tingling, tremors, sensory change, speech change, focal weakness, weakness and headaches.  Endo/Heme/Allergies: Negative for environmental allergies. Does not bruise/bleed easily.  Psychiatric/Behavioral: Negative.  Negative for depression, hallucinations and memory loss. The patient is not nervous/anxious and does not have insomnia.   All other systems reviewed and are negative.  Performance status (ECOG):  1  Vitals Blood pressure (!) 158/74, pulse 82, temperature (!) 96.8 F (36 C), resp. rate 18, weight 180 lb 12.4 oz (82 kg), SpO2 100 %.   Physical Exam  Constitutional: She is oriented to person, place, and time. She appears well-developed and well-nourished. No distress.  Patient sitting comfortably in exam room.  She limps slightly secondary to a bruised sensation on her left foot.  HENT:  Head: Normocephalic and atraumatic.  Mouth/Throat: Oropharynx is clear and moist. No oropharyngeal exudate.  Short gray hair.  Eyes: Pupils are equal, round, and reactive to light. Conjunctivae are normal. No scleral icterus.  Glasses.  Blue eyes.  Neck: Normal range of motion. Neck supple. No JVD present.  Cardiovascular: Normal rate, regular rhythm and normal heart sounds.  No murmur heard. Pulmonary/Chest: Effort normal and breath sounds normal. No respiratory distress. She has no wheezes. She has no rales. She exhibits no tenderness.  Abdominal: Soft. Bowel sounds are normal. She exhibits no distension and no mass. There is no abdominal tenderness. There is no rebound and no guarding.  Musculoskeletal: Normal range of motion.        General: No tenderness or edema.  Lymphadenopathy:       Head (right  side): No preauricular, no posterior auricular and no occipital adenopathy present.       Head (left side): No preauricular, no posterior auricular and no occipital adenopathy present.    She has no cervical adenopathy.    She has no axillary adenopathy.       Right: No inguinal and no supraclavicular adenopathy present.       Left: No inguinal and no supraclavicular adenopathy present.  Neurological: She is alert and oriented to person, place, and time.  Skin: Skin is warm and dry. No rash noted. She is not diaphoretic. No erythema. No pallor.  Psychiatric: She has a normal mood and affect. Her behavior is normal. Judgment and thought content normal.  Nursing note and vitals reviewed.   Appointment on 12/14/2018  Component Date Value Ref Range Status  . Iron 12/14/2018 88  28 - 170 ug/dL Final  . TIBC 12/14/2018 392  250 - 450 ug/dL Final  . Saturation Ratios 12/14/2018 23  10.4 - 31.8 % Final  . UIBC 12/14/2018 304  ug/dL Final   Performed at [facility], [address], [place], [place] [address]  . Ferritin 12/14/2018 24  11 - 307 ng/mL Final   Performed at [facility], [address] [place],  [address]  . WBC 12/14/2018 7.3  4.0 - 10.5 K/uL Final  . RBC 12/14/2018 3.53* 3.87 - 5.11 MIL/uL Final  . Hemoglobin 12/14/2018 10.4* 12.0 - 15.0 g/dL Final  . HCT 12/14/2018 31.8* 36.0 - 46.0 % Final  . MCV 12/14/2018 90.1  80.0 - 100.0 fL Final  . MCH 12/14/2018 29.5  26.0 - 34.0 pg Final  . MCHC 12/14/2018 32.7  30.0 - 36.0 g/dL Final  . RDW 12/14/2018 12.4  11.5 - 15.5 % Final  . Platelets 12/14/2018 362  150 - 400 K/uL Final  . nRBC 12/14/2018 0.0  0.0 - 0.2 % Final  . Neutrophils Relative % 12/14/2018 66  % Final  . Neutro Abs 12/14/2018 4.9  1.7 - 7.7 K/uL Final  . Lymphocytes Relative 12/14/2018 20  % Final  . Lymphs Abs 12/14/2018 1.4  0.7 - 4.0 K/uL Final  . Monocytes Relative 12/14/2018 9  % Final  . Monocytes Absolute 12/14/2018 0.6  0.1 - 1.0  K/uL Final  . Eosinophils Relative 12/14/2018 4  % Final  . Eosinophils Absolute 12/14/2018 0.3  0.0 - 0.5 K/uL Final  . Basophils Relative 12/14/2018 1  % Final  . Basophils Absolute 12/14/2018 0.1  0.0 - 0.1 K/uL Final  .  Immature Granulocytes 12/14/2018 0  % Final  . Abs Immature Granulocytes 12/14/2018 0.03  0.00 - 0.07 K/uL Final   Performed at [facility], [address], [place], [place] [address]  . Vitamin B-12 12/14/2018 812  180 - 914 pg/mL Final   Comment: (NOTE) This assay is not validated for testing neonatal or myeloproliferative syndrome specimens for Vitamin B12 levels. Performed at [facility], [place] [address], [place], [place] [address]     Assessment:  Misty Villarreal is a 68 y.o. female with stage IIIBchronic kidney diseaseand a normocytic anemiaand hypogammaglobulinemia.Dietis fair. She is intolerant of oral iron.  Labs on 08/28/2020revealed no M-spike but hypogammaglobulinemia. Random UPEP and ANAwere negative. Labs on 10/02/2020revealed a hematocrit 29.7, hemoglobin 9.8, MCV 88.2, platelets 309,000,and WBC7,200. Albumen and calcium were normal. Iron saturation 18% with a TIBC 357.  Work up on 11/11/2018:  hematocrit 32.2, hemoglobin 10.7, MCV 89.2, platelets 303,000, and WBC 6,000.  Ferritin was 11 (low).  B12 was 104 (low).  Normal studies included: folate, TSH, SPEP, immunoglobulin levels, free light chain ratio, 24 hour UPEP and free light chains.    Ferritin has been followed: 25 on 06/21/2015, 11 on 11/11/2018, and 24 on 12/14/2018.  She has B12 deficiency.  B12 was 104 on 11/11/2018 and 812 on 12/14/2018.  She has been on oral B12.  Anti-parietal antibody and intrinsic factor antibody were normal on 12/14/2018.   She has stage IIIB chronic kidney diseasefelt secondary to diabetes. Creatinine was 1.36 on 10/23/2018 (CrCl 39 ml/min).   Colonoscopyon 05/08/2017 revealed diverticulosis and non-bleeding internal  hemorrhoids.  Symptomatically, she has had GI difficulties with oral iron.  Her left foot feels bruised.  Plan: 1.   Review labs from 12/14/2018. 2.   Iron deficiency anemia             Hematocrit 31.8.  Hemoglobin 10.4. MCV 90.1                         Ferritin 24. Diet is fair to poor.  She is intolerant of oral iron.   Oral iron resulted in abdominal cramping and diarrhea.  Last colonoscopy was in 2019.  She has never had an EGD.              She denies any melena, hematochezia, hematuria or vaginal bleeding.             Discuss consideration of weekly Venofer x 3.   Potential side effects reviewed.   Patient consents to treatment. 2. B12 deficiency             B12 was 104 on 11/11/2018 and 812 on 12/14/2018.             She began oral B12 1000 mcg a day on 11/12/2018.             B12 goal is 400.             Anti-parietal antibody and intrinsic factor antibody were normal.   No evidence of pernicious anemia.   Continue oral B12. 3.   Hypogammaglobulinemia, resolved Patient has a history of hypogammaglobulinemia.             Immunoglobulin levels were normal on 11/11/2018.             SPEP, FLCA and 24 hour urine for UPEP and free light chains were normal. 4.   RTC in 6 weeks for labs (CBC, ferritin).  5.   RTC in 3 months for MD assessment, labs (CBC with differential, ferritin, iron studies- day before) and +/- Venofer.  I discussed the assessment and treatment plan with the patient.  The patient was provided an opportunity to ask questions and all were answered.  The patient agreed with the plan and demonstrated an understanding of the instructions.  The patient was advised to call back if the symptoms worsen or if the condition fails to improve as anticipated.  I provided 15 minutes of face-to-face time during this this encounter and > 50% was spent counseling as documented under my assessment and plan.    Lequita Asal, MD, PhD     12/15/2018, 10:25 AM  I, Selena Batten, am acting as scribe for [employer]. Mike Gip, MD, PhD.  I,  C. Mike Gip, MD, have reviewed the above documentation for accuracy and completeness, and I agree with the above.

## 2018-12-14 ENCOUNTER — Inpatient Hospital Stay: Payer: Medicare HMO

## 2018-12-14 ENCOUNTER — Other Ambulatory Visit: Payer: Self-pay

## 2018-12-14 DIAGNOSIS — R7989 Other specified abnormal findings of blood chemistry: Secondary | ICD-10-CM

## 2018-12-14 DIAGNOSIS — N1832 Chronic kidney disease, stage 3b: Secondary | ICD-10-CM

## 2018-12-14 DIAGNOSIS — D631 Anemia in chronic kidney disease: Secondary | ICD-10-CM

## 2018-12-14 DIAGNOSIS — D509 Iron deficiency anemia, unspecified: Secondary | ICD-10-CM

## 2018-12-14 DIAGNOSIS — E538 Deficiency of other specified B group vitamins: Secondary | ICD-10-CM | POA: Diagnosis present

## 2018-12-14 DIAGNOSIS — D801 Nonfamilial hypogammaglobulinemia: Secondary | ICD-10-CM | POA: Diagnosis not present

## 2018-12-14 LAB — IRON AND TIBC
Iron: 88 ug/dL (ref 28–170)
Saturation Ratios: 23 % (ref 10.4–31.8)
TIBC: 392 ug/dL (ref 250–450)
UIBC: 304 ug/dL

## 2018-12-14 LAB — CBC WITH DIFFERENTIAL/PLATELET
Abs Immature Granulocytes: 0.03 10*3/uL (ref 0.00–0.07)
Basophils Absolute: 0.1 10*3/uL (ref 0.0–0.1)
Basophils Relative: 1 %
Eosinophils Absolute: 0.3 10*3/uL (ref 0.0–0.5)
Eosinophils Relative: 4 %
HCT: 31.8 % — ABNORMAL LOW (ref 36.0–46.0)
Hemoglobin: 10.4 g/dL — ABNORMAL LOW (ref 12.0–15.0)
Immature Granulocytes: 0 %
Lymphocytes Relative: 20 %
Lymphs Abs: 1.4 10*3/uL (ref 0.7–4.0)
MCH: 29.5 pg (ref 26.0–34.0)
MCHC: 32.7 g/dL (ref 30.0–36.0)
MCV: 90.1 fL (ref 80.0–100.0)
Monocytes Absolute: 0.6 10*3/uL (ref 0.1–1.0)
Monocytes Relative: 9 %
Neutro Abs: 4.9 10*3/uL (ref 1.7–7.7)
Neutrophils Relative %: 66 %
Platelets: 362 10*3/uL (ref 150–400)
RBC: 3.53 MIL/uL — ABNORMAL LOW (ref 3.87–5.11)
RDW: 12.4 % (ref 11.5–15.5)
WBC: 7.3 10*3/uL (ref 4.0–10.5)
nRBC: 0 % (ref 0.0–0.2)

## 2018-12-14 LAB — VITAMIN B12: Vitamin B-12: 812 pg/mL (ref 180–914)

## 2018-12-14 LAB — FERRITIN: Ferritin: 24 ng/mL (ref 11–307)

## 2018-12-15 ENCOUNTER — Encounter: Payer: Self-pay | Admitting: Emergency Medicine

## 2018-12-15 ENCOUNTER — Other Ambulatory Visit: Payer: Self-pay

## 2018-12-15 ENCOUNTER — Inpatient Hospital Stay: Payer: Medicare HMO | Admitting: Hematology and Oncology

## 2018-12-15 ENCOUNTER — Encounter: Payer: Self-pay | Admitting: Hematology and Oncology

## 2018-12-15 ENCOUNTER — Inpatient Hospital Stay: Payer: Medicare HMO

## 2018-12-15 ENCOUNTER — Ambulatory Visit
Admission: EM | Admit: 2018-12-15 | Discharge: 2018-12-15 | Disposition: A | Discharge status: Home / Self Care | Payer: Medicare HMO | Attending: Family Medicine | Admitting: Family Medicine

## 2018-12-15 VITALS — BP 146/83 | HR 66 | Temp 96.3°F | Resp 16

## 2018-12-15 VITALS — BP 158/74 | HR 82 | Temp 96.8°F | Resp 18 | Wt 180.8 lb

## 2018-12-15 DIAGNOSIS — D509 Iron deficiency anemia, unspecified: Secondary | ICD-10-CM | POA: Diagnosis not present

## 2018-12-15 DIAGNOSIS — D801 Nonfamilial hypogammaglobulinemia: Secondary | ICD-10-CM

## 2018-12-15 DIAGNOSIS — E538 Deficiency of other specified B group vitamins: Secondary | ICD-10-CM | POA: Diagnosis not present

## 2018-12-15 DIAGNOSIS — M722 Plantar fascial fibromatosis: Secondary | ICD-10-CM | POA: Diagnosis not present

## 2018-12-15 DIAGNOSIS — N1832 Chronic kidney disease, stage 3b: Secondary | ICD-10-CM

## 2018-12-15 LAB — INTRINSIC FACTOR ANTIBODIES: Intrinsic Factor: 1 AU/mL (ref 0.0–1.1)

## 2018-12-15 LAB — ANTI-PARIETAL ANTIBODY: Parietal Cell Antibody-IgG: 0.8 Units (ref 0.0–20.0)

## 2018-12-15 MED ORDER — TRAMADOL HCL 50 MG PO TABS: 50.0000 mg | ORAL_TABLET | Freq: Two times a day (BID) | ORAL | 0 refills | Status: DC | PRN

## 2018-12-15 MED ORDER — SODIUM CHLORIDE 0.9 % IV SOLN: 200.0000 mg | Freq: Once | INTRAVENOUS | Status: DC

## 2018-12-15 MED ORDER — SODIUM CHLORIDE 0.9 % IV SOLN
Freq: Once | INTRAVENOUS | Status: AC
Start: 2018-12-15 — End: 2018-12-15
  Administered 2018-12-15: 11:00:00 via INTRAVENOUS
  Filled 2018-12-15: qty 250

## 2018-12-15 MED ORDER — IRON SUCROSE 20 MG/ML IV SOLN
200.0000 mg | Freq: Once | INTRAVENOUS | Status: AC
  Administered 2018-12-15: 200 mg via INTRAVENOUS
  Filled 2018-12-15: qty 10

## 2018-12-15 NOTE — ED Triage Notes (Signed)
Pt c/o left foot pain. She states that she started feeling pain in the side on the bottom of her foot about a week ago then yesterday she started having pain on the bottom of her foot near the arch and hard to bare weight. She has tried ice and elevation. No known injury.

## 2018-12-15 NOTE — Progress Notes (Signed)
Patient states she is doing well. She does report diarrhea and the bottom of her left foot feels bruised when she walks.

## 2018-12-15 NOTE — Patient Instructions (Addendum)
Stop oral iron    Iron Sucrose injection What is this medicine? IRON SUCROSE (AHY ern SOO krohs) is an iron complex. Iron is used to make healthy red blood cells, which carry oxygen and nutrients throughout the body. This medicine is used to treat iron deficiency anemia in people with chronic kidney disease. This medicine may be used for other purposes; ask your health care provider or pharmacist if you have questions. COMMON BRAND NAME(S): Venofer What should I tell my health care provider before I take this medicine? They need to know if you have any of these conditions:  anemia not caused by low iron levels  heart disease  high levels of iron in the blood  kidney disease  liver disease  an unusual or allergic reaction to iron, other medicines, foods, dyes, or preservatives  pregnant or trying to get pregnant  breast-feeding How should I use this medicine? This medicine is for infusion into a vein. It is given by a health care professional in a hospital or clinic setting. Talk to your pediatrician regarding the use of this medicine in children. While this drug may be prescribed for children as young as 2 years for selected conditions, precautions do apply. Overdosage: If you think you have taken too much of this medicine contact a poison control center or emergency room at once. NOTE: This medicine is only for you. Do not share this medicine with others. What if I miss a dose? It is important not to miss your dose. Call your doctor or health care professional if you are unable to keep an appointment. What may interact with this medicine? Do not take this medicine with any of the following medications:  deferoxamine  dimercaprol  other iron products This medicine may also interact with the following medications:  chloramphenicol  deferasirox This list may not describe all possible interactions. Give your health care provider a list of all the medicines, herbs,  non-prescription drugs, or dietary supplements you use. Also tell them if you smoke, drink alcohol, or use illegal drugs. Some items may interact with your medicine. What should I watch for while using this medicine? Visit your doctor or healthcare professional regularly. Tell your doctor or healthcare professional if your symptoms do not start to get better or if they get worse. You may need blood work done while you are taking this medicine. You may need to follow a special diet. Talk to your doctor. Foods that contain iron include: whole grains/cereals, dried fruits, beans, or peas, leafy green vegetables, and organ meats (liver, kidney). What side effects may I notice from receiving this medicine? Side effects that you should report to your doctor or health care professional as soon as possible:  allergic reactions like skin rash, itching or hives, swelling of the face, lips, or tongue  breathing problems  changes in blood pressure  cough  fast, irregular heartbeat  feeling faint or lightheaded, falls  fever or chills  flushing, sweating, or hot feelings  joint or muscle aches/pains  seizures  swelling of the ankles or feet  unusually weak or tired Side effects that usually do not require medical attention (report to your doctor or health care professional if they continue or are bothersome):  diarrhea  feeling achy  headache  irritation at site where injected  nausea, vomiting  stomach upset  tiredness This list may not describe all possible side effects. Call your doctor for medical advice about side effects. You may report side effects to FDA  at 1-800-FDA-1088. Where should I keep my medicine? This drug is given in a hospital or clinic and will not be stored at home. NOTE: This sheet is a summary. It may not cover all possible information. If you have questions about this medicine, talk to your doctor, pharmacist, or health care provider.  2020 Elsevier/Gold  Standard (2010-10-18 17:14:35)

## 2018-12-15 NOTE — Patient Instructions (Signed)

## 2018-12-15 NOTE — Discharge Instructions (Signed)
Rest.  Ice.  Heel cups.  Exercises.  Medication as needed.  Take care  Dr. Lacinda Axon

## 2018-12-15 NOTE — ED Provider Notes (Signed)
[facility]    CSN: [id number] Arrival date & time: 12/15/18  1448  History   Chief Complaint Chief Complaint  Patient presents with  . Foot Pain    left   HPI  68 year old female with an extensive past medical history presents with left foot pain.  Patient reports a 1 week history of left foot pain.  She states that it started on the side of her foot but now her pain is located on the plantar aspect of her left foot.  She reports difficulty ambulating.  Worse when bearing weight.  She has used ice and elevation without relief.  No recent fall, trauma, injury.  No reports of bruising.  Seems to be worse as the day goes on.  No other associated symptoms.  No other complaints.  PMH, Surgical Hx, Family Hx, Social History reviewed and updated as below. PMH: Patient Active Problem List   Diagnosis Date Noted  . Iron deficiency anemia 11/26/2018  . B12 deficiency 11/26/2018  . Hypogammaglobulinemia ([place]) 11/11/2018  . Anemia due to stage 3b chronic kidney disease 11/11/2018  . Osteopenia determined by x-ray 02/16/2018  . Major depressive disorder with single episode, in partial remission ([place]) 08/04/2017  . Neck pain on right side 08/05/2016  . Tinnitus of right ear 08/05/2016  . Type II diabetes mellitus with complication ([place]) [id number]  . Normocytic anemia 06/24/2015  . Localized edema 06/21/2015  . History of paroxysmal supraventricular tachycardia 06/21/2015  . PAD (peripheral artery disease) ([place]) 02/02/2015  . Hyperlipidemia associated with type 2 diabetes mellitus ([place]) 09/01/2014  . Neuropathy 09/01/2014  . Phlebectasia 09/01/2014  . CKD (chronic kidney disease) stage 3, GFR 30-59 ml/min (HCC) 09/01/2014  . Essential (primary) hypertension 09/01/2014  . Spondylolisthesis at L4-L5 level 07/05/2014  . Arthritis, degenerative 01/31/2014    Past Surgical History:  Procedure Laterality Date  . ANGIOPLASTY / STENTING FEMORAL Left 2014, 2013  . BRAIN  MENINGIOMA EXCISION  1988  . BREAST BIOPSY Right 1989   benign  . CARPAL TUNNEL RELEASE Left 1980  . CHOLECYSTECTOMY  1987  . COLONOSCOPY WITH PROPOFOL N/A 05/08/2017   Procedure: COLONOSCOPY WITH PROPOFOL;  Surgeon: Jonathon Bellows, MD;  Location: [facility];  Service: Gastroenterology;  Laterality: N/A;  . KNEE ARTHROPLASTY Right 2009  . LOWER EXTREMITY ANGIOGRAPHY Left 07/21/2018   Procedure: LOWER EXTREMITY ANGIOGRAPHY;  Surgeon: Katha Cabal, MD;  Location: [place] [facility];  Service: Cardiovascular;  Laterality: Left;    OB History   No obstetric history on file.      Home Medications    Prior to Admission medications   Medication Sig Start Date End Date Taking? Authorizing Provider  atorvastatin (LIPITOR) 40 MG tablet Take 1 tablet (40 mg total) by mouth daily. 01/28/18  Yes Glean Hess, MD  clopidogrel (PLAVIX) 75 MG tablet TAKE 1 TABLET DAILY 03/19/18  Yes Schnier, Dolores Lory, MD  Cyanocobalamin (VITAMIN B 12 PO) Take by mouth. 1000mg  daily   Yes [provider]  fexofenadine (ALLEGRA) 180 MG tablet Take 180 mg by mouth daily.    Yes [provider]  gabapentin (NEURONTIN) 100 MG capsule Take 1-3 capsules (100-300 mg total) by mouth at bedtime. 09/09/18  Yes Glean Hess, MD  Iron, Ferrous Sulfate, 325 (65 Fe) MG TABS Take by mouth.   Yes [provider]  lisinopril (PRINIVIL,ZESTRIL) 20 MG tablet Take 1 tablet (20 mg total) by mouth daily. 04/28/18  Yes Glean Hess, MD  Magnesium 250  MG TABS Take 500 mg by mouth at bedtime.   Yes [provider]  metFORMIN (GLUCOPHAGE) 500 MG tablet TAKE 1 TABLET TWICE A DAY 09/19/18  Yes Glean Hess, MD  Multiple Vitamin tablet Take 1 tablet by mouth daily.    Yes [provider]  rOPINIRole (REQUIP) 0.5 MG tablet TAKE 1 TO 3 TABLETS(0.5 TO 1.5 MG) BY MOUTH AT BEDTIME Patient taking differently: Take 1.5 mg by mouth at bedtime. Patient said she takes 2-3 nightly 05/19/18   Yes Glean Hess, MD  sertraline (ZOLOFT) 50 MG tablet TAKE 1 TABLET DAILY 09/19/18  Yes Glean Hess, MD  aspirin 81 MG chewable tablet Chew 1 tablet by mouth daily.     [provider]  meclizine (ANTIVERT) 12.5 MG tablet Take 1 tablet (12.5 mg total) by mouth 2 (two) times daily as needed for dizziness. 09/09/18   Glean Hess, MD  traMADol (ULTRAM) 50 MG tablet Take 1 tablet (50 mg total) by mouth every 12 (twelve) hours as needed. 12/15/18   Coral Spikes, DO    Family History Family History  Problem Relation Age of Onset  . Diabetes Mother   . Heart failure Father   . Diabetes Father   . Breast cancer Neg Hx     Social History Social History   Tobacco Use  . Smoking status: Former Smoker    Quit date: 04/05/2013    Years since quitting: 5.6  . Smokeless tobacco: Never Used  Substance Use Topics  . Alcohol use: Yes    Alcohol/week: 2.0 standard drinks    Types: 2 Standard drinks or equivalent per week    Comment: occasional  . Drug use: No     Allergies   Augmentin [amoxicillin-pot clavulanate], Tetracycline, Cefaclor, Cephalexin, and Sulfa antibiotics   Review of Systems Review of Systems  Constitutional: Negative.   Skin:       Left foot pain.   Physical Exam Triage Vital Signs ED Triage Vitals  Enc Vitals Group     BP 12/15/18 1550 (!) 161/66     Pulse Rate 12/15/18 1550 77     Resp 12/15/18 1550 18     Temp 12/15/18 1550 97.8 F (36.6 C)     Temp Source 12/15/18 1550 Oral     SpO2 12/15/18 1550 100 %     Weight 12/15/18 1547 178 lb (80.7 kg)     Height 12/15/18 1547 5\' 2"  (1.575 m)     Head Circumference --      Peak Flow --      Pain Score 12/15/18 1547 7     Pain Loc --      Pain Edu? --      Excl. in [place]? --    Updated Vital Signs BP (!) 161/66 (BP Location: Left Arm)   Pulse 77   Temp 97.8 F (36.6 C) (Oral)   Resp 18   Ht 5\' 2"  (1.575 m)   Wt 80.7 kg   LMP  (LMP Unknown)   SpO2 100%   BMI 32.56 kg/m   Visual  Acuity Right Eye Distance:   Left Eye Distance:   Bilateral Distance:    Right Eye Near:   Left Eye Near:    Bilateral Near:     Physical Exam Vitals signs and nursing note reviewed.  Constitutional:      General: She is not in acute distress.    Appearance: Normal appearance. She is not ill-appearing.  HENT:  Head: Normocephalic and atraumatic.  Eyes:     General:        Right eye: No discharge.        Left eye: No discharge.     Conjunctiva/sclera: Conjunctivae normal.  Cardiovascular:     Rate and Rhythm: Normal rate and regular rhythm.  Pulmonary:     Effort: Pulmonary effort is normal. No respiratory distress.  Musculoskeletal:       Feet:  Feet:     Comments: Patient with exquisite tenderness to palpation at the labeled location which is the attachment site of the plantar fascia. Neurological:     Mental Status: She is alert.  Psychiatric:        Mood and Affect: Mood normal.        Behavior: Behavior normal.      UC Treatments / Results  Labs (all labs ordered are listed, but only abnormal results are displayed) Labs Reviewed - No data to display  EKG   Radiology No results found.  Procedures Procedures (including critical care time)  Medications Ordered in UC Medications - No data to display  Initial Impression / Assessment and Plan / UC Course  I have reviewed the triage vital signs and the nursing notes.  Pertinent labs & imaging results that were available during my care of the patient were reviewed by me and considered in my medical decision making (see chart for details).    68 year old female presents with plantar fasciitis.  Treating with rest, ice, heel cups, and exercises.  Tramadol as needed for pain.  Cannot use NSAIDs in the setting of chronic kidney disease.  Final Clinical Impressions(s) / UC Diagnoses   Final diagnoses:  Plantar fasciitis of left foot     Discharge Instructions     Rest.  Ice.  Heel cups.   Exercises.  Medication as needed.  Take care  Dr. Lacinda Axon    ED Prescriptions    Medication Sig Dispense Auth. Provider   traMADol (ULTRAM) 50 MG tablet Take 1 tablet (50 mg total) by mouth every 12 (twelve) hours as needed. 10 tablet Thersa Salt G, DO     I have reviewed the PDMP during this encounter.   Coral Spikes, [place] 12/15/18 [phone]

## 2018-12-22 ENCOUNTER — Inpatient Hospital Stay: Payer: Medicare HMO | Attending: Hematology and Oncology

## 2018-12-22 ENCOUNTER — Other Ambulatory Visit: Payer: Self-pay

## 2018-12-22 VITALS — BP 143/80 | HR 76 | Temp 97.0°F | Resp 18

## 2018-12-22 DIAGNOSIS — D509 Iron deficiency anemia, unspecified: Secondary | ICD-10-CM | POA: Diagnosis present

## 2018-12-22 DIAGNOSIS — N1832 Chronic kidney disease, stage 3b: Secondary | ICD-10-CM

## 2018-12-22 MED ORDER — SODIUM CHLORIDE 0.9 % IV SOLN: 200.0000 mg | Freq: Once | INTRAVENOUS | Status: DC

## 2018-12-22 MED ORDER — SODIUM CHLORIDE 0.9 % IV SOLN
Freq: Once | INTRAVENOUS | Status: AC
  Administered 2018-12-22: 14:00:00 via INTRAVENOUS
  Filled 2018-12-22: qty 250

## 2018-12-22 MED ORDER — IRON SUCROSE 20 MG/ML IV SOLN
200.0000 mg | Freq: Once | INTRAVENOUS | Status: AC
  Administered 2018-12-22: 200 mg via INTRAVENOUS

## 2018-12-23 ENCOUNTER — Other Ambulatory Visit: Payer: Self-pay

## 2018-12-23 ENCOUNTER — Encounter: Payer: Self-pay | Admitting: Internal Medicine

## 2018-12-23 ENCOUNTER — Other Ambulatory Visit: Payer: Medicare HMO

## 2018-12-23 ENCOUNTER — Ambulatory Visit: Payer: Medicare HMO | Admitting: Internal Medicine

## 2018-12-23 VITALS — BP 118/68 | HR 78 | Ht 62.0 in | Wt 182.0 lb

## 2018-12-23 DIAGNOSIS — G2581 Restless legs syndrome: Secondary | ICD-10-CM | POA: Insufficient documentation

## 2018-12-23 DIAGNOSIS — M722 Plantar fascial fibromatosis: Secondary | ICD-10-CM | POA: Insufficient documentation

## 2018-12-23 DIAGNOSIS — M7120 Synovial cyst of popliteal space [Baker], unspecified knee: Secondary | ICD-10-CM | POA: Diagnosis not present

## 2018-12-23 DIAGNOSIS — E118 Type 2 diabetes mellitus with unspecified complications: Secondary | ICD-10-CM

## 2018-12-23 DIAGNOSIS — D631 Anemia in chronic kidney disease: Secondary | ICD-10-CM | POA: Diagnosis not present

## 2018-12-23 DIAGNOSIS — D649 Anemia, unspecified: Secondary | ICD-10-CM | POA: Diagnosis not present

## 2018-12-23 DIAGNOSIS — I10 Essential (primary) hypertension: Secondary | ICD-10-CM | POA: Diagnosis not present

## 2018-12-23 DIAGNOSIS — N1832 Chronic kidney disease, stage 3b: Secondary | ICD-10-CM | POA: Diagnosis not present

## 2018-12-23 MED ORDER — ROPINIROLE HCL 0.5 MG PO TABS: ORAL_TABLET | ORAL | 1 refills | Status: DC

## 2018-12-23 MED ORDER — MECLIZINE HCL 12.5 MG PO TABS: 12.5000 mg | ORAL_TABLET | Freq: Two times a day (BID) | ORAL | 2 refills | Status: DC | PRN

## 2018-12-23 NOTE — Progress Notes (Signed)
Date:  12/23/2018   Name:  Misty Villarreal   DOB:  13-Jul-1950   MRN:  [id number]   Chief Complaint: Diabetes (4 month follow up. ) DM eye exam due. Immunization History  Administered Date(s) Administered  . Influenza, High Dose Seasonal PF 11/13/2016, 11/12/2018  . Influenza-Unspecified 11/21/2016, 12/18/2017  . Pneumococcal Conjugate-13 08/04/2015  . Pneumococcal Polysaccharide-23 12/15/2012, 09/09/2018  . Zoster Recombinat (Shingrix) 12/04/2017, 11/12/2018    Diabetes She presents for her follow-up diabetic visit. She has type 2 diabetes mellitus. Her disease course has been stable. Pertinent negatives for hypoglycemia include no dizziness, headaches or nervousness/anxiousness. Pertinent negatives for diabetes include no chest pain and no fatigue. Her weight is stable. She monitors blood glucose at home 1-2 x per day. Her dinner blood glucose is taken between 5-6 pm. Her dinner blood glucose range is generally 140-180 mg/dl. An ACE inhibitor/angiotensin II receptor blocker is being taken.  Hypertension This is a chronic problem. The problem is controlled (does not check at home). Pertinent negatives include no chest pain, headaches or shortness of breath. Past treatments include ACE inhibitors.  Foot Injury  There was no injury mechanism. The pain is present in the left foot. The quality of the pain is described as aching and shooting. The pain is moderate. The pain has been improving (after icing twice a day - UC dx as fasciitis) since onset. The symptoms are aggravated by weight bearing. She has tried ice for the symptoms.    Lab Results  Component Value Date   CREATININE 1.64 (H) 09/09/2018   BUN 47 (H) 09/09/2018   NA 135 09/09/2018   K 5.9 (H) 09/09/2018   CL 100 09/09/2018   CO2 21 09/09/2018   Lab Results  Component Value Date   CHOL 162 09/09/2018   HDL 57 09/09/2018   LDLCALC 77 09/09/2018   TRIG 142 09/09/2018   CHOLHDL 2.8 09/09/2018   Lab Results   Component Value Date   TSH 4.385 11/11/2018   Lab Results  Component Value Date   HGBA1C 6.4 (H) 09/09/2018     Review of Systems  Constitutional: Negative for chills and fatigue.  HENT: Negative for trouble swallowing.   Eyes: Negative for visual disturbance.  Respiratory: Negative for cough, chest tightness, shortness of breath and wheezing.   Cardiovascular: Negative for chest pain and leg swelling.  Musculoskeletal: Positive for arthralgias (pain behind both knees; pain in left foot).  Skin: Negative for color change and rash.  Neurological: Negative for dizziness, light-headedness and headaches.  Psychiatric/Behavioral: Positive for dysphoric mood (still grieving her husband's death). Negative for sleep disturbance. The patient is not nervous/anxious.     Patient Active Problem List   Diagnosis Date Noted  . Stage 3b chronic kidney disease 12/23/2018  . Iron deficiency anemia 11/26/2018  . B12 deficiency 11/26/2018  . Hypogammaglobulinemia ([place]) 11/11/2018  . Anemia due to stage 3b chronic kidney disease 11/11/2018  . Osteopenia determined by x-ray 02/16/2018  . Major depressive disorder with single episode, in partial remission ([place]) 08/04/2017  . Neck pain on right side 08/05/2016  . Tinnitus of right ear 08/05/2016  . Type II diabetes mellitus with complication ([place]) [id number]  . Normocytic anemia 06/24/2015  . Localized edema 06/21/2015  . History of paroxysmal supraventricular tachycardia 06/21/2015  . PAD (peripheral artery disease) ([place]) 02/02/2015  . Hyperlipidemia associated with type 2 diabetes mellitus ([place]) 09/01/2014  . Neuropathy 09/01/2014  . Phlebectasia 09/01/2014  . Essential (primary) hypertension 09/01/2014  .  Spondylolisthesis at L4-L5 level 07/05/2014  . Arthritis, degenerative 01/31/2014    Allergies  Allergen Reactions  . Augmentin [Amoxicillin-Pot Clavulanate] Diarrhea  . Tetracycline     Other reaction(s): emesis  . Cefaclor Rash  .  Cephalexin Rash  . Sulfa Antibiotics Rash    Other reaction(s): emesis    Past Surgical History:  Procedure Laterality Date  . ANGIOPLASTY / STENTING FEMORAL Left 2014, 2013  . BRAIN MENINGIOMA EXCISION  1988  . BREAST BIOPSY Right 1989   benign  . CARPAL TUNNEL RELEASE Left 1980  . CHOLECYSTECTOMY  1987  . COLONOSCOPY WITH PROPOFOL N/A 05/08/2017   Procedure: COLONOSCOPY WITH PROPOFOL;  Surgeon: Jonathon Bellows, MD;  Location: [facility] ENDOSCOPY;  Service: Gastroenterology;  Laterality: N/A;  . KNEE ARTHROPLASTY Right 2009  . LOWER EXTREMITY ANGIOGRAPHY Left 07/21/2018   Procedure: LOWER EXTREMITY ANGIOGRAPHY;  Surgeon: Katha Cabal, MD;  Location: [place] [facility];  Service: Cardiovascular;  Laterality: Left;    Social History   Tobacco Use  . Smoking status: Former Smoker    Quit date: 04/05/2013    Years since quitting: 5.7  . Smokeless tobacco: Never Used  Substance Use Topics  . Alcohol use: Yes    Alcohol/week: 2.0 standard drinks    Types: 2 Standard drinks or equivalent per week    Comment: occasional  . Drug use: No     Medication list has been reviewed and updated.  Current Meds  Medication Sig  . aspirin 81 MG chewable tablet Chew 1 tablet by mouth daily.   Marland Kitchen atorvastatin (LIPITOR) 40 MG tablet Take 1 tablet (40 mg total) by mouth daily.  . clopidogrel (PLAVIX) 75 MG tablet TAKE 1 TABLET DAILY  . Cyanocobalamin (VITAMIN B 12 PO) Take by mouth. 1000mg  daily  . fexofenadine (ALLEGRA) 180 MG tablet Take 180 mg by mouth daily.   Marland Kitchen gabapentin (NEURONTIN) 100 MG capsule Take 1-3 capsules (100-300 mg total) by mouth at bedtime.  . Iron, Ferrous Sulfate, 325 (65 Fe) MG TABS Take by mouth.  Marland Kitchen lisinopril (PRINIVIL,ZESTRIL) 20 MG tablet Take 1 tablet (20 mg total) by mouth daily.  . Magnesium 250 MG TABS Take 500 mg by mouth at bedtime.  . meclizine (ANTIVERT) 12.5 MG tablet Take 1 tablet (12.5 mg total) by mouth 2 (two) times daily as needed for dizziness. (Patient  taking differently: Take 12.5 mg by mouth 2 (two) times daily as needed for dizziness. )  . metFORMIN (GLUCOPHAGE) 500 MG tablet TAKE 1 TABLET TWICE A DAY  . Multiple Vitamin tablet Take 1 tablet by mouth daily.   Marland Kitchen rOPINIRole (REQUIP) 0.5 MG tablet TAKE 1 TO 3 TABLETS(0.5 TO 1.5 MG) BY MOUTH AT BEDTIME (Patient taking differently: Take 1.5 mg by mouth at bedtime. Patient said she takes 2-3 nightly)  . sertraline (ZOLOFT) 50 MG tablet TAKE 1 TABLET DAILY  . traMADol (ULTRAM) 50 MG tablet Take 1 tablet (50 mg total) by mouth every 12 (twelve) hours as needed.    PHQ 2/9 Scores 12/23/2018 09/09/2018 04/28/2018 01/28/2018  PHQ - 2 Score 0 1 0 0  PHQ- 9 Score 0 1 - 0    BP Readings from Last 3 Encounters:  12/23/18 118/68  12/22/18 (!) 143/80  12/15/18 (!) 161/66    Physical Exam Vitals signs and nursing note reviewed.  Constitutional:      General: She is not in acute distress.    Appearance: Normal appearance. She is well-developed.  HENT:     Head:  Normocephalic and atraumatic.  Neck:     Musculoskeletal: Normal range of motion.  Cardiovascular:     Rate and Rhythm: Normal rate and regular rhythm.     Pulses: Normal pulses.     Heart sounds: No murmur.  Pulmonary:     Effort: Pulmonary effort is normal. No respiratory distress.     Breath sounds: No wheezing or rhonchi.  Musculoskeletal:     Right knee: She exhibits swelling (posterior knee). She exhibits normal range of motion and no effusion.     Left knee: She exhibits swelling (posterior knee). She exhibits normal range of motion and no effusion.     Right lower leg: No edema.     Left lower leg: No edema.  Lymphadenopathy:     Cervical: No cervical adenopathy.  Skin:    General: Skin is warm and dry.     Capillary Refill: Capillary refill takes less than 2 seconds.     Findings: No rash.  Neurological:     General: No focal deficit present.     Mental Status: She is alert and oriented to person, place, and time.   Psychiatric:        Attention and Perception: Attention normal.        Mood and Affect: Mood normal.        Behavior: Behavior normal.        Thought Content: Thought content normal.     Wt Readings from Last 3 Encounters:  12/23/18 182 lb (82.6 kg)  12/15/18 178 lb (80.7 kg)  12/15/18 180 lb 12.4 oz (82 kg)    BP 118/68   Pulse 78   Ht 5\' 2"  (1.575 m)   Wt 182 lb (82.6 kg)   LMP  (LMP Unknown)   SpO2 98%   BMI 33.29 kg/m   Assessment and Plan: 1. Type II diabetes mellitus with complication (HCC) Clinically stable by exam and report without s/s of hypoglycemia. DM complicated by HTN, lipids, renal disease. Tolerating medications metformin 500 mg bid well without side effects or other concerns. - Hemoglobin A1c  2. Essential (primary) hypertension Clinically stable exam with well controlled BP.   Tolerating medications, lisinopril 20 mg, without side effects at this time. Pt to continue current regimen and low sodium diet; benefits of regular exercise as able discussed.  3. Stage 3b chronic kidney disease Stable disease; continue to monitor Anemia being treated with Epogen by Hematology - Basic metabolic panel  4. Restless leg syndrome symptoms well controlled - rOPINIRole (REQUIP) 0.5 MG tablet; TAKE 1 TO 3 TABLETS(0.5 TO 1.5 MG) BY MOUTH AT BEDTIME  Dispense: 270 tablet; Refill: 1  5. Unruptured synovial cyst of popliteal space, unspecified laterality Ortho diagnosed with bilateral Baker's cysts  6. Plantar fasciitis of left foot Seen by UC; currently improving with use of ice twice a day   Partially dictated using Editor, commissioning. Any errors are unintentional.  Halina Maidens, MD [facility]  12/23/2018

## 2018-12-24 LAB — BASIC METABOLIC PANEL
BUN/Creatinine Ratio: 30 — ABNORMAL HIGH (ref 12–28)
BUN: 40 mg/dL — ABNORMAL HIGH (ref 8–27)
CO2: 18 mmol/L — ABNORMAL LOW (ref 20–29)
Calcium: 9.4 mg/dL (ref 8.7–10.3)
Chloride: 106 mmol/L (ref 96–106)
Creatinine, Ser: 1.35 mg/dL — ABNORMAL HIGH (ref 0.57–1.00)
GFR calc Af Amer: 46 mL/min/{1.73_m2} — ABNORMAL LOW (ref >59)
GFR calc non Af Amer: 40 mL/min/{1.73_m2} — ABNORMAL LOW (ref >59)
Glucose: 89 mg/dL (ref 65–99)
Potassium: 5.6 mmol/L — ABNORMAL HIGH (ref 3.5–5.2)
Sodium: 138 mmol/L (ref 134–144)

## 2018-12-24 LAB — HEMOGLOBIN A1C
Est. average glucose Bld gHb Est-mCnc: 140 mg/dL
Hgb A1c MFr Bld: 6.5 % — ABNORMAL HIGH (ref 4.8–5.6)

## 2018-12-25 ENCOUNTER — Other Ambulatory Visit: Payer: Self-pay | Admitting: Internal Medicine

## 2018-12-25 DIAGNOSIS — G2581 Restless legs syndrome: Secondary | ICD-10-CM

## 2018-12-25 DIAGNOSIS — E782 Mixed hyperlipidemia: Secondary | ICD-10-CM

## 2018-12-29 ENCOUNTER — Inpatient Hospital Stay: Payer: Medicare HMO

## 2018-12-29 ENCOUNTER — Other Ambulatory Visit: Payer: Self-pay

## 2018-12-29 VITALS — BP 137/83 | HR 69 | Temp 97.0°F | Resp 18

## 2018-12-29 DIAGNOSIS — D509 Iron deficiency anemia, unspecified: Secondary | ICD-10-CM | POA: Diagnosis not present

## 2018-12-29 DIAGNOSIS — N1832 Chronic kidney disease, stage 3b: Secondary | ICD-10-CM

## 2018-12-29 MED ORDER — IRON SUCROSE 20 MG/ML IV SOLN
200.0000 mg | Freq: Once | INTRAVENOUS | Status: AC
  Administered 2018-12-29: 11:00:00 200 mg via INTRAVENOUS

## 2018-12-29 MED ORDER — SODIUM CHLORIDE 0.9 % IV SOLN
Freq: Once | INTRAVENOUS | Status: AC
  Administered 2018-12-29: 11:00:00 via INTRAVENOUS
  Filled 2018-12-29: qty 250

## 2018-12-29 MED ORDER — SODIUM CHLORIDE 0.9 % IV SOLN: 200.0000 mg | Freq: Once | INTRAVENOUS | Status: DC

## 2018-12-29 NOTE — Patient Instructions (Signed)

## 2019-01-20 ENCOUNTER — Other Ambulatory Visit: Payer: Medicare HMO

## 2019-01-21 ENCOUNTER — Ambulatory Visit: Payer: Medicare HMO | Admitting: Hematology and Oncology

## 2019-01-21 ENCOUNTER — Ambulatory Visit: Payer: Medicare HMO

## 2019-01-25 ENCOUNTER — Ambulatory Visit: Payer: Medicare HMO | Admitting: Hematology and Oncology

## 2019-01-25 ENCOUNTER — Other Ambulatory Visit: Payer: Self-pay

## 2019-01-25 ENCOUNTER — Other Ambulatory Visit: Payer: Medicare HMO

## 2019-01-26 ENCOUNTER — Inpatient Hospital Stay: Payer: Medicare HMO | Attending: Hematology and Oncology

## 2019-01-26 DIAGNOSIS — E538 Deficiency of other specified B group vitamins: Secondary | ICD-10-CM

## 2019-01-26 DIAGNOSIS — D801 Nonfamilial hypogammaglobulinemia: Secondary | ICD-10-CM | POA: Diagnosis present

## 2019-01-26 DIAGNOSIS — N1832 Chronic kidney disease, stage 3b: Secondary | ICD-10-CM

## 2019-01-26 DIAGNOSIS — D509 Iron deficiency anemia, unspecified: Secondary | ICD-10-CM | POA: Insufficient documentation

## 2019-01-26 LAB — CBC
HCT: 35.4 % — ABNORMAL LOW (ref 36.0–46.0)
Hemoglobin: 11.7 g/dL — ABNORMAL LOW (ref 12.0–15.0)
MCH: 29.3 pg (ref 26.0–34.0)
MCHC: 33.1 g/dL (ref 30.0–36.0)
MCV: 88.7 fL (ref 80.0–100.0)
Platelets: 276 10*3/uL (ref 150–400)
RBC: 3.99 MIL/uL (ref 3.87–5.11)
RDW: 12.8 % (ref 11.5–15.5)
WBC: 6.9 10*3/uL (ref 4.0–10.5)
nRBC: 0 % (ref 0.0–0.2)

## 2019-01-26 LAB — FERRITIN: Ferritin: 124 ng/mL (ref 11–307)

## 2019-02-06 DIAGNOSIS — E119 Type 2 diabetes mellitus without complications: Secondary | ICD-10-CM | POA: Diagnosis not present

## 2019-03-05 ENCOUNTER — Other Ambulatory Visit: Payer: Self-pay

## 2019-03-06 NOTE — Progress Notes (Signed)
[facility]  [address] [place], [place] [address] Phone: [phone]  Fax: [phone]   Telemedicine Office Visit:  03/09/2019  Referring physician: Glean Hess, MD  I connected with Misty Villarreal on 03/09/2019 at 11:11 AM by videoconferencing and verified that I was speaking with the correct person using 2 identifiers.  The patient was at home.  I discussed the limitations, risk, security and privacy concerns of performing an evaluation and management service by videoconferencing and the availability of in person appointments.  I also discussed with the patient that there may be a patient responsible charge related to this service.  The patient expressed understanding and agreed to proceed.   Chief Complaint: Misty Villarreal is a 69 y.o. female with stage IIIBchronic kidney disease, anemia, and hypogammaglobulinemia who is seen for a 3 month assessment.  HPI:  The patient was last seen in the hematology clinic on 12/15/2018. At that time, she had GI difficulties with oral iron. Her left foot felt bruised. Hematocrit 31.8, hemoglobin 10.4, platelets 362,000, WBC 7,300. Ferritin was 24 with an iron saturation of 23% and a TIBC of 392. B12 was 812. Parietal call antibody and intrinsic factor antibodies were negative.  She remained on oral B12.   She received Venofer weekly x 3 (12/15/2018 - 12/29/2018).  She tolerated Venofer well; she did not feel any different after her infusions.   She was seen by Dr. Holley Raring on 02/26/2019. Her chronic kidney disease appeared to be stable. Patient was on lisinopril 20 mg p.o. daily.   Labs followed: 01/26/2019: Hematocrit 35.4, hemoglobin 11.7, MCV 88.7, platelets 276,000, WBC 6,900. Ferritin was 124.  03/08/2019: Hematocrit 34.5, hemoglobin 11.6, MCV 87.6, platelets 265,000, WBC 7,300. Ferritin was 100 with iron saturation of 33% and a TIBC of 356.   During the interim, she has felt tired. Her energy  is low. She notes some depression after the first holidays without her husband. She remains on oral B12.  She notes some weight gain. She is not active.   She notes a constant dry cough secondary to allergies. She is taking Allegra daily. She denies any pain in her legs. She denies any claudication. She is eating well. She is taking multivitamins. She continues to have diarrhea secondary to oral iron. She is not taking any oral iron. She has occasional muscle cramps at night.    Past Medical History:  Diagnosis Date  . Abnormal glandular Papanicolaou smear of cervix 09/01/2014   Normal Pap 2012   . Chronic kidney disease   . Diabetes mellitus without complication ([place])   . Hyperlipidemia   . Peripheral vascular disease ([place])   . Slurred speech 12/03/2016    Past Surgical History:  Procedure Laterality Date  . ANGIOPLASTY / STENTING FEMORAL Left 2014, 2013  . BRAIN MENINGIOMA EXCISION  1988  . BREAST BIOPSY Right 1989   benign  . CARPAL TUNNEL RELEASE Left 1980  . CHOLECYSTECTOMY  1987  . COLONOSCOPY WITH PROPOFOL N/A 05/08/2017   Procedure: COLONOSCOPY WITH PROPOFOL;  Surgeon: Jonathon Bellows, MD;  Location: [facility] ENDOSCOPY;  Service: Gastroenterology;  Laterality: N/A;  . KNEE ARTHROPLASTY Right 2009  . LOWER EXTREMITY ANGIOGRAPHY Left 07/21/2018   Procedure: LOWER EXTREMITY ANGIOGRAPHY;  Surgeon: Katha Cabal, MD;  Location: [place] CV LAB;  Service: Cardiovascular;  Laterality: Left;    Family History  Problem Relation Age of Onset  . Diabetes Mother   . Heart failure Father   . Diabetes  Father   . Breast cancer Neg Hx     Social History:  reports that she quit smoking about 5 years ago. She has never used smokeless tobacco. She reports current alcohol use of about 2.0 standard drinks of alcohol per week. She reports that she does not use drugs. She previously smoked a pack of cigarettes a day for 40 years. She drinks red wine occasionally.He denies any exposure to  radiation or toxins. She is retired. 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 Her daughter's name is [name]. Her husband, [name], died in 2018/07/06. She lives in [place]. The patient is alone today.  Participants in the patient's visit and their role in the encounter included the patient and [name], RN, today.  The intake visit was provided by [name], RN.  Allergies:  Allergies  Allergen Reactions  . Augmentin [Amoxicillin-Pot Clavulanate] Diarrhea  . Tetracycline     Other reaction(s): emesis  . Cefaclor Rash  . Cephalexin Rash  . Sulfa Antibiotics Rash    Other reaction(s): emesis    Current Medications: Current Outpatient Medications  Medication Sig Dispense Refill  . aspirin 81 MG chewable tablet Chew 1 tablet by mouth daily.     Marland Kitchen atorvastatin (LIPITOR) 40 MG tablet TAKE 1 TABLET DAILY 90 tablet 3  . clopidogrel (PLAVIX) 75 MG tablet TAKE 1 TABLET DAILY 90 tablet 4  . Cyanocobalamin (VITAMIN B 12 PO) Take 1,000 mcg by mouth daily. 1000mg  daily     . fexofenadine (ALLEGRA) 180 MG tablet Take 180 mg by mouth daily.     Marland Kitchen lisinopril (PRINIVIL,ZESTRIL) 20 MG tablet Take 1 tablet (20 mg total) by mouth daily. 90 tablet 3  . meclizine (ANTIVERT) 12.5 MG tablet Take 1 tablet (12.5 mg total) by mouth 2 (two) times daily as needed for dizziness. 60 tablet 2  . metFORMIN (GLUCOPHAGE) 500 MG tablet TAKE 1 TABLET TWICE A DAY 180 tablet 3  . Multiple Vitamin tablet Take 1 tablet by mouth daily.     . sertraline (ZOLOFT) 50 MG tablet TAKE 1 TABLET DAILY 90 tablet 3  . gabapentin (NEURONTIN) 100 MG capsule TAKE 1 TO 3 CAPSULES AT BEDTIME (Patient not taking: Reported on 03/08/2019) 270 capsule 3  . Magnesium 250 MG TABS Take 500 mg by mouth at bedtime.    Marland Kitchen rOPINIRole (REQUIP) 0.5 MG tablet TAKE 1 TO 3 TABLETS(0.5 TO 1.5 MG) BY MOUTH AT BEDTIME (Patient not taking: Reported on 03/08/2019) 270 tablet 1  . traMADol (ULTRAM) 50 MG tablet Take 1  tablet (50 mg total) by mouth every 12 (twelve) hours as needed. (Patient not taking: Reported on 03/08/2019) 10 tablet 0   No current facility-administered medications for this visit.    Review of Systems  Constitutional: Positive for malaise/fatigue (low energy). Negative for chills, diaphoresis, fever and weight loss (gaining).       Feels tired. Not active.  HENT: Negative.  Negative for congestion, ear pain, hearing loss, nosebleeds, sinus pain and sore throat.   Eyes: Negative.  Negative for blurred vision, double vision and photophobia.  Respiratory: Positive for cough (dry- allergy related). Negative for sputum production, shortness of breath and wheezing.   Cardiovascular: Negative for chest pain, palpitations, orthopnea and leg swelling.       Multiple blockages in legs on Plavix.  Gastrointestinal: Positive for diarrhea (mild on Imodium). Negative for abdominal pain, blood in stool, constipation, melena, nausea and vomiting.       Eating well.  IBS.  No prior EGD.  Genitourinary: Negative.  Negative for dysuria, frequency, hematuria and urgency.  Musculoskeletal: Negative for back pain, joint pain, myalgias (mucles cramps at night; occasional) and neck pain.       Baker's cyst. Chronic leg pain- improving.   Skin: Negative.  Negative for itching and rash.  Neurological: Negative.  Negative for dizziness, tingling, tremors, sensory change, speech change, focal weakness, weakness and headaches.  Endo/Heme/Allergies: Positive for environmental allergies (on Allegra). Does not bruise/bleed easily.  Psychiatric/Behavioral: Negative.  Negative for depression, hallucinations and memory loss. The patient is not nervous/anxious and does not have insomnia.   All other systems reviewed and are negative.  Performance status (ECOG):  1  Physical Exam  Constitutional: She is oriented to person, place, and time. She appears well-developed and well-nourished. No distress.  HENT:  Head:  Normocephalic and atraumatic.  Short gray hair.  Eyes: Conjunctivae are normal. No scleral icterus.  Glasses.  Blue eyes.  Neurological: She is alert and oriented to person, place, and time.  Skin: She is not diaphoretic.  Psychiatric: She has a normal mood and affect. Her behavior is normal. Judgment and thought content normal.  Nursing note reviewed.   Appointment on 03/08/2019  Component Date Value Ref Range Status  . WBC 03/08/2019 7.3  4.0 - 10.5 K/uL Final  . RBC 03/08/2019 3.94  3.87 - 5.11 MIL/uL Final  . Hemoglobin 03/08/2019 11.6* 12.0 - 15.0 g/dL Final  . HCT 03/08/2019 34.5* 36.0 - 46.0 % Final  . MCV 03/08/2019 87.6  80.0 - 100.0 fL Final  . MCH 03/08/2019 29.4  26.0 - 34.0 pg Final  . MCHC 03/08/2019 33.6  30.0 - 36.0 g/dL Final  . RDW 03/08/2019 12.7  11.5 - 15.5 % Final  . Platelets 03/08/2019 265  150 - 400 K/uL Final  . nRBC 03/08/2019 0.0  0.0 - 0.2 % Final  . Neutrophils Relative % 03/08/2019 65  % Final  . Neutro Abs 03/08/2019 4.8  1.7 - 7.7 K/uL Final  . Lymphocytes Relative 03/08/2019 21  % Final  . Lymphs Abs 03/08/2019 1.6  0.7 - 4.0 K/uL Final  . Monocytes Relative 03/08/2019 9  % Final  . Monocytes Absolute 03/08/2019 0.6  0.1 - 1.0 K/uL Final  . Eosinophils Relative 03/08/2019 3  % Final  . Eosinophils Absolute 03/08/2019 0.2  0.0 - 0.5 K/uL Final  . Basophils Relative 03/08/2019 1  % Final  . Basophils Absolute 03/08/2019 0.1  0.0 - 0.1 K/uL Final  . Immature Granulocytes 03/08/2019 1  % Final  . Abs Immature Granulocytes 03/08/2019 0.04  0.00 - 0.07 K/uL Final   Performed at [facility], [address] [place], [place] [address]  . Iron 03/08/2019 116  28 - 170 ug/dL Final  . TIBC 03/08/2019 356  250 - 450 ug/dL Final  . Saturation Ratios 03/08/2019 33* 10.4 - 31.8 % Final  . UIBC 03/08/2019 240  ug/dL Final   Performed at [facility], [address] [place], [place] [address]  . Ferritin 03/08/2019 100  11 - 307 ng/mL  Final   Performed at [facility], [address] [place], [place] [address]    Assessment:  Misty Villarreal is a 69 y.o. female with stage IIIBchronic kidney diseaseand a normocytic anemiaand hypogammaglobulinemia.Dietis fair. She is intolerant of oral iron.  Labs on 08/28/2020revealed no M-spike but hypogammaglobulinemia. Random UPEP and ANAwere negative. Labs on 10/02/2020revealed a hematocrit 29.7, hemoglobin 9.8, MCV 88.2,  platelets 309,000,and WBC7,200. Albumen and calcium were normal. Iron saturation 18% with a TIBC 357.  Work upon 11/11/2018:hematocrit 32.2, hemoglobin 10.7, MCV 89.2, platelets 303,000,andWBC 6,000. Ferritinwas11(low).B12was 104 (low). Normal studiesincluded: folate,TSH, SPEP, immunoglobulin levels, free light chain ratio,24 hour UPEP and free light chains.  She received Venofer weekly x 3 (12/15/2018 - 12/29/2018).  Ferritin has been followed: 25 on 06/21/2015, 11 on 11/11/2018, 24 on 12/14/2018, 124 on 01/26/2019, and 100 on 03/08/2019.  She is on oral ion.  She has B12 deficiency.  B12 was 104 on 11/11/2018 and 812 on 12/14/2018.  She is on oral B12.  Anti-parietal antibody and intrinsic factor antibody were normal on 12/14/2018.   She has stage IIIB chronic kidney diseasefelt secondary to diabetes. Creatinine was 1.36 on 10/23/2018 (CrCl 39 ml/min).   Colonoscopyon 05/08/2017 revealed diverticulosis and non-bleeding internal hemorrhoids.  Symptomatically, she feels fatigued.  Hemoglobin is 11.6.  Plan: 1.   Review labs from 03/08/2019. 2.   Iron deficiency anemia Hematocrit 31.8. Hemoglobin 10.4. MCV 90.1 on 12/14/2018. Ferritin 24.  Hematocrit 34.5. Hemoglobin 11.6. MCV 87.6 on 03/08/2019. Ferritin 100. She has improved s/p Venofer weekly x 3.  Diet remains modest.             Last colonoscopy was in 2019.She has never had  an EGD.             She denies any melena, hematochezia, hematuria or vaginal bleeding. Continue to monitor. 3.B12 deficiency B12 was 104 on 11/11/2018 and 812 on 12/14/2018. She is on oral B12. B12 goal is 400. No evidence of pernicious anemia.              Continue oral B12. 4.RTC in 3 months for labs (CBC with diff, ferritin, iron studies).   5.   RTC in 6 months for MD assessment, labs (CBC with diff, ferritin, iron studies- day before) and+/- Venofer   I discussed the assessment and treatment plan with the patient.  The patient was provided an opportunity to ask questions and all were answered.  The patient agreed with the plan and demonstrated an understanding of the instructions.  The patient was advised to call back if the symptoms worsen or if the condition fails to improve as anticipated.  I provided 16 minutes (11:11 AM -11:27 AM) of face-to-face video visit time during this this encounter and > 50% was spent counseling as documented under my assessment and plan.  I provided these services from the [facility] office.   Lequita Asal, MD, PhD    03/09/2019, 11:11 AM  I, Selena Batten, am acting as scribe for [employer]. Mike Gip, MD, PhD.  I, Erdine Hulen C. Mike Gip, MD, have reviewed the above documentation for accuracy and completeness, and I agree with the above.

## 2019-03-08 ENCOUNTER — Other Ambulatory Visit: Payer: Self-pay

## 2019-03-08 ENCOUNTER — Inpatient Hospital Stay: Payer: Medicare HMO | Attending: Hematology and Oncology

## 2019-03-08 DIAGNOSIS — D801 Nonfamilial hypogammaglobulinemia: Secondary | ICD-10-CM | POA: Insufficient documentation

## 2019-03-08 DIAGNOSIS — E1122 Type 2 diabetes mellitus with diabetic chronic kidney disease: Secondary | ICD-10-CM | POA: Diagnosis not present

## 2019-03-08 DIAGNOSIS — D509 Iron deficiency anemia, unspecified: Secondary | ICD-10-CM | POA: Diagnosis not present

## 2019-03-08 DIAGNOSIS — N1832 Chronic kidney disease, stage 3b: Secondary | ICD-10-CM | POA: Insufficient documentation

## 2019-03-08 DIAGNOSIS — E538 Deficiency of other specified B group vitamins: Secondary | ICD-10-CM | POA: Insufficient documentation

## 2019-03-08 LAB — CBC WITH DIFFERENTIAL/PLATELET
Abs Immature Granulocytes: 0.04 10*3/uL (ref 0.00–0.07)
Basophils Absolute: 0.1 10*3/uL (ref 0.0–0.1)
Basophils Relative: 1 %
Eosinophils Absolute: 0.2 10*3/uL (ref 0.0–0.5)
Eosinophils Relative: 3 %
HCT: 34.5 % — ABNORMAL LOW (ref 36.0–46.0)
Hemoglobin: 11.6 g/dL — ABNORMAL LOW (ref 12.0–15.0)
Immature Granulocytes: 1 %
Lymphocytes Relative: 21 %
Lymphs Abs: 1.6 10*3/uL (ref 0.7–4.0)
MCH: 29.4 pg (ref 26.0–34.0)
MCHC: 33.6 g/dL (ref 30.0–36.0)
MCV: 87.6 fL (ref 80.0–100.0)
Monocytes Absolute: 0.6 10*3/uL (ref 0.1–1.0)
Monocytes Relative: 9 %
Neutro Abs: 4.8 10*3/uL (ref 1.7–7.7)
Neutrophils Relative %: 65 %
Platelets: 265 10*3/uL (ref 150–400)
RBC: 3.94 MIL/uL (ref 3.87–5.11)
RDW: 12.7 % (ref 11.5–15.5)
WBC: 7.3 10*3/uL (ref 4.0–10.5)
nRBC: 0 % (ref 0.0–0.2)

## 2019-03-08 LAB — FERRITIN: Ferritin: 100 ng/mL (ref 11–307)

## 2019-03-08 LAB — IRON AND TIBC
Iron: 116 ug/dL (ref 28–170)
Saturation Ratios: 33 % — ABNORMAL HIGH (ref 10.4–31.8)
TIBC: 356 ug/dL (ref 250–450)
UIBC: 240 ug/dL

## 2019-03-08 NOTE — Progress Notes (Signed)
Confirmed Name and DOB. Denies any concerns.  

## 2019-03-09 ENCOUNTER — Inpatient Hospital Stay (HOSPITAL_BASED_OUTPATIENT_CLINIC_OR_DEPARTMENT_OTHER): Payer: Medicare HMO | Admitting: Hematology and Oncology

## 2019-03-09 ENCOUNTER — Encounter: Payer: Self-pay | Admitting: Hematology and Oncology

## 2019-03-09 ENCOUNTER — Inpatient Hospital Stay: Payer: Medicare HMO

## 2019-03-09 DIAGNOSIS — D509 Iron deficiency anemia, unspecified: Secondary | ICD-10-CM | POA: Diagnosis not present

## 2019-03-09 DIAGNOSIS — E538 Deficiency of other specified B group vitamins: Secondary | ICD-10-CM | POA: Diagnosis not present

## 2019-03-15 ENCOUNTER — Other Ambulatory Visit: Payer: Self-pay | Admitting: Internal Medicine

## 2019-03-15 DIAGNOSIS — I10 Essential (primary) hypertension: Secondary | ICD-10-CM

## 2019-05-17 ENCOUNTER — Ambulatory Visit (INDEPENDENT_AMBULATORY_CARE_PROVIDER_SITE_OTHER): Payer: Medicare HMO | Admitting: Vascular Surgery

## 2019-05-17 ENCOUNTER — Ambulatory Visit (INDEPENDENT_AMBULATORY_CARE_PROVIDER_SITE_OTHER): Payer: Medicare HMO

## 2019-05-17 ENCOUNTER — Encounter (INDEPENDENT_AMBULATORY_CARE_PROVIDER_SITE_OTHER): Payer: Self-pay | Admitting: Vascular Surgery

## 2019-05-17 ENCOUNTER — Other Ambulatory Visit: Payer: Self-pay

## 2019-05-17 VITALS — BP 155/81 | HR 76 | Resp 16 | Wt 186.8 lb

## 2019-05-17 DIAGNOSIS — I10 Essential (primary) hypertension: Secondary | ICD-10-CM | POA: Diagnosis not present

## 2019-05-17 DIAGNOSIS — I739 Peripheral vascular disease, unspecified: Secondary | ICD-10-CM | POA: Diagnosis not present

## 2019-05-17 DIAGNOSIS — E118 Type 2 diabetes mellitus with unspecified complications: Secondary | ICD-10-CM

## 2019-05-17 DIAGNOSIS — E1169 Type 2 diabetes mellitus with other specified complication: Secondary | ICD-10-CM

## 2019-05-17 DIAGNOSIS — E785 Hyperlipidemia, unspecified: Secondary | ICD-10-CM

## 2019-05-17 NOTE — Progress Notes (Signed)
MRN : [id number]  Misty Villarreal is a 69 y.o. (1950/05/29) female who presents with chief complaint of No chief complaint on file. Marland Kitchen  History of Present Illness:   The patient returns to the office for followup and review status post angiogram with interventionon 07/21/2018.  Procedure(s) Performed: 1. Introduction catheter intoleftlower extremity 3rd order catheter placement  2.Contrast injection leftlower extremity for distal runoff  3. Crosser atherectomy of theleftSFA and popliteal arteries 4. Percutaneous transluminal angioplastyleftsuperficial femoral artery and popliteal 5. Star close closurerightcommon femoral arteriotomy  The patient notes improvement in the lower extremity symptoms. No interval shortening of the patient's claudication distance or rest pain symptoms. Previous wounds have now healed. No new ulcers or wounds have occurred since the last visit.  There have been no significant changes to the patient's overall health care.  The patient denies amaurosis fugax or recent TIA symptoms. There are no recent neurological changes noted. The patient denies history of DVT, PE or superficial thrombophlebitis. The patient denies recent episodes of angina or shortness of breath.   ABI's Rt=0.95 and Lt=0.72(previous ABI's Rt=0.93 and Lt=0.76) Duplex ultrasound of the arterial system bilaterally shows the SFA intervention site on the right is patent and the left is occluded  No outpatient medications have been marked as taking for the 05/17/19 encounter (Appointment) with Delana Meyer, Dolores Lory, MD.    Past Medical History:  Diagnosis Date  . Abnormal glandular Papanicolaou smear of cervix 09/01/2014   Normal Pap 2012   . Chronic kidney disease   . Diabetes mellitus without complication ([place])   . Hyperlipidemia   . Peripheral vascular disease ([place])   . Slurred speech 12/03/2016    Past  Surgical History:  Procedure Laterality Date  . ANGIOPLASTY / STENTING FEMORAL Left 2014, 2013  . BRAIN MENINGIOMA EXCISION  1988  . BREAST BIOPSY Right 1989   benign  . CARPAL TUNNEL RELEASE Left 1980  . CHOLECYSTECTOMY  1987  . COLONOSCOPY WITH PROPOFOL N/A 05/08/2017   Procedure: COLONOSCOPY WITH PROPOFOL;  Surgeon: Jonathon Bellows, MD;  Location: [facility] ENDOSCOPY;  Service: Gastroenterology;  Laterality: N/A;  . KNEE ARTHROPLASTY Right 2009  . LOWER EXTREMITY ANGIOGRAPHY Left 07/21/2018   Procedure: LOWER EXTREMITY ANGIOGRAPHY;  Surgeon: Katha Cabal, MD;  Location: [place] CV LAB;  Service: Cardiovascular;  Laterality: Left;    Social History Social History   Tobacco Use  . Smoking status: Former Smoker    Quit date: 04/05/2013    Years since quitting: 6.1  . Smokeless tobacco: Never Used  Substance Use Topics  . Alcohol use: Yes    Alcohol/week: 2.0 standard drinks    Types: 2 Standard drinks or equivalent per week    Comment: occasional  . Drug use: No    Family History Family History  Problem Relation Age of Onset  . Diabetes Mother   . Heart failure Father   . Diabetes Father   . Breast cancer Neg Hx     Allergies  Allergen Reactions  . Augmentin [Amoxicillin-Pot Clavulanate] Diarrhea  . Tetracycline     Other reaction(s): emesis  . Cefaclor Rash  . Cephalexin Rash  . Sulfa Antibiotics Rash    Other reaction(s): emesis     REVIEW OF SYSTEMS (Negative unless checked)  Constitutional: [] Weight loss  [] Fever  [] Chills Cardiac: [] Chest pain   [] Chest pressure   [] Palpitations   [] Shortness of breath when laying flat   [] Shortness of breath with exertion. Vascular:  [x] Pain in legs with  walking   [] Pain in legs at rest  [] History of DVT   [] Phlebitis   [] Swelling in legs   [] Varicose veins   [] Non-healing ulcers Pulmonary:   [] Uses home oxygen   [] Productive cough   [] Hemoptysis   [] Wheeze  [] COPD   [] Asthma Neurologic:  [] Dizziness   [] Seizures    [] History of stroke   [] History of TIA  [] Aphasia   [] Vissual changes   [] Weakness or numbness in arm   [] Weakness or numbness in leg Musculoskeletal:   [] Joint swelling   [x] Joint pain   [] Low back pain Hematologic:  [] Easy bruising  [] Easy bleeding   [] Hypercoagulable state   [] Anemic Gastrointestinal:  [] Diarrhea   [] Vomiting  [] Gastroesophageal reflux/heartburn   [] Difficulty swallowing. Genitourinary:  [] Chronic kidney disease   [] Difficult urination  [] Frequent urination   [] Blood in urine Skin:  [] Rashes   [] Ulcers  Psychological:  [] History of anxiety   []  History of major depression.  Physical Examination  There were no vitals filed for this visit. There is no height or weight on file to calculate BMI. Gen: WD/WN, NAD Head: [place]/AT, No temporalis wasting.  Ear/Nose/Throat: Hearing grossly intact, nares w/o erythema or drainage Eyes: PER, EOMI, sclera nonicteric.  Neck: Supple, no large masses.   Pulmonary:  Good air movement, no audible wheezing bilaterally, no use of accessory muscles.  Cardiac: RRR, no JVD Vascular:  Vessel Right Left  Radial Palpable Palpable  Gastrointestinal: Non-distended. No guarding/no peritoneal signs.  Musculoskeletal: M/S 5/5 throughout.  No deformity or atrophy.  Neurologic: CN 2-12 intact. Symmetrical.  Speech is fluent. Motor exam as listed above. Psychiatric: Judgment intact, Mood & affect appropriate for pt's clinical situation. Dermatologic: No rashes or ulcers noted.  No changes consistent with cellulitis.  CBC Lab Results  Component Value Date   WBC 7.3 03/08/2019   HGB 11.6 (L) 03/08/2019   HCT 34.5 (L) 03/08/2019   MCV 87.6 03/08/2019   PLT 265 03/08/2019    BMET    Component Value Date/Time   NA 138 12/23/2018 1350   NA 139 04/27/2013 0713   K 5.6 (H) 12/23/2018 1350   K 4.2 04/27/2013 0713   CL 106 12/23/2018 1350   CL 108 (H) 04/27/2013 0713   CO2 18 (L) 12/23/2018 1350   CO2 25 04/27/2013 0713   GLUCOSE 89 12/23/2018  1350   GLUCOSE 146 (H) 12/03/2016 1625   GLUCOSE 137 (H) 04/27/2013 0713   BUN 40 (H) 12/23/2018 1350   BUN 21 (H) 04/27/2013 0713   CREATININE 1.35 (H) 12/23/2018 1350   CREATININE 1.28 04/27/2013 0713   CALCIUM 9.4 12/23/2018 1350   CALCIUM 9.3 04/27/2013 0713   GFRNONAA 40 (L) 12/23/2018 1350   GFRNONAA 44 (L) 04/27/2013 0713   GFRAA 46 (L) 12/23/2018 1350   GFRAA 52 (L) 04/27/2013 0713   CrCl cannot be calculated (Patient's most recent lab result is older than the maximum 21 days allowed.).  COAG Lab Results  Component Value Date   INR 0.85 12/03/2016    Radiology No results found.   Assessment/Plan 1. PAD (peripheral artery disease) (HCC) Recommend:  The patient has evidence of atherosclerosis of the lower extremities with claudication.  The patient does not voice lifestyle limiting changes at this point in time.  Noninvasive studies do not suggest clinically significant change.  No invasive studies, angiography or surgery at this time The patient should continue walking and begin a more formal exercise program.  The patient should continue antiplatelet therapy and aggressive  treatment of the lipid abnormalities  No changes in the patient's medications at this time  The patient should continue wearing graduated compression socks 10-15 mmHg strength to control the mild edema.   - VAS [place] ABI WITH/WO TBI; Future  2. Essential (primary) hypertension Continue antihypertensive medications as already ordered, these medications have been reviewed and there are no changes at this time.   3. Type II diabetes mellitus with complication (HCC) Continue hypoglycemic medications as already ordered, these medications have been reviewed and there are no changes at this time.  Hgb A1C to be monitored as already arranged by primary service   4. Hyperlipidemia associated with type 2 diabetes mellitus ([place]) Continue statin as ordered and reviewed, no changes at this  time     Hortencia Pilar, MD  05/17/2019 8:52 AM

## 2019-05-29 ENCOUNTER — Other Ambulatory Visit (INDEPENDENT_AMBULATORY_CARE_PROVIDER_SITE_OTHER): Payer: Self-pay | Admitting: Vascular Surgery

## 2019-06-08 ENCOUNTER — Other Ambulatory Visit: Payer: Medicare HMO

## 2019-06-28 ENCOUNTER — Other Ambulatory Visit
Admission: RE | Admit: 2019-06-28 | Discharge: 2019-06-28 | Disposition: A | Discharge status: Home / Self Care | Payer: Medicare HMO | Attending: Nephrology | Admitting: Nephrology

## 2019-06-28 ENCOUNTER — Other Ambulatory Visit: Payer: Self-pay

## 2019-06-28 ENCOUNTER — Inpatient Hospital Stay: Payer: Medicare HMO | Attending: Hematology and Oncology

## 2019-06-28 DIAGNOSIS — E875 Hyperkalemia: Secondary | ICD-10-CM | POA: Diagnosis not present

## 2019-06-28 DIAGNOSIS — E1122 Type 2 diabetes mellitus with diabetic chronic kidney disease: Secondary | ICD-10-CM | POA: Diagnosis not present

## 2019-06-28 DIAGNOSIS — N1832 Chronic kidney disease, stage 3b: Secondary | ICD-10-CM | POA: Insufficient documentation

## 2019-06-28 DIAGNOSIS — I129 Hypertensive chronic kidney disease with stage 1 through stage 4 chronic kidney disease, or unspecified chronic kidney disease: Secondary | ICD-10-CM | POA: Insufficient documentation

## 2019-06-28 DIAGNOSIS — N2581 Secondary hyperparathyroidism of renal origin: Secondary | ICD-10-CM | POA: Insufficient documentation

## 2019-06-28 DIAGNOSIS — D631 Anemia in chronic kidney disease: Secondary | ICD-10-CM | POA: Insufficient documentation

## 2019-06-28 DIAGNOSIS — D509 Iron deficiency anemia, unspecified: Secondary | ICD-10-CM | POA: Insufficient documentation

## 2019-06-28 LAB — RENAL FUNCTION PANEL
Albumin: 3.8 g/dL (ref 3.5–5.0)
Anion gap: 9 (ref 5–15)
BUN: 22 mg/dL (ref 8–23)
CO2: 24 mmol/L (ref 22–32)
Calcium: 8.9 mg/dL (ref 8.9–10.3)
Chloride: 104 mmol/L (ref 98–111)
Creatinine, Ser: 1.43 mg/dL — ABNORMAL HIGH (ref 0.44–1.00)
GFR calc Af Amer: 43 mL/min — ABNORMAL LOW (ref >60)
GFR calc non Af Amer: 37 mL/min — ABNORMAL LOW (ref >60)
Glucose, Bld: 155 mg/dL — ABNORMAL HIGH (ref 70–99)
Phosphorus: 3.7 mg/dL (ref 2.5–4.6)
Potassium: 5 mmol/L (ref 3.5–5.1)
Sodium: 137 mmol/L (ref 135–145)

## 2019-06-28 LAB — PROTEIN / CREATININE RATIO, URINE
Creatinine, Urine: 189 mg/dL
Protein Creatinine Ratio: 0.06 mg/mg{Cre} (ref 0.00–0.15)
Total Protein, Urine: 12 mg/dL

## 2019-06-28 LAB — CBC WITH DIFFERENTIAL/PLATELET
Abs Immature Granulocytes: 0.02 10*3/uL (ref 0.00–0.07)
Basophils Absolute: 0.1 10*3/uL (ref 0.0–0.1)
Basophils Relative: 1 %
Eosinophils Absolute: 0.3 10*3/uL (ref 0.0–0.5)
Eosinophils Relative: 4 %
HCT: 32.8 % — ABNORMAL LOW (ref 36.0–46.0)
Hemoglobin: 11 g/dL — ABNORMAL LOW (ref 12.0–15.0)
Immature Granulocytes: 0 %
Lymphocytes Relative: 25 %
Lymphs Abs: 1.5 10*3/uL (ref 0.7–4.0)
MCH: 30.1 pg (ref 26.0–34.0)
MCHC: 33.5 g/dL (ref 30.0–36.0)
MCV: 89.6 fL (ref 80.0–100.0)
Monocytes Absolute: 0.6 10*3/uL (ref 0.1–1.0)
Monocytes Relative: 10 %
Neutro Abs: 3.7 10*3/uL (ref 1.7–7.7)
Neutrophils Relative %: 60 %
Platelets: 295 10*3/uL (ref 150–400)
RBC: 3.66 MIL/uL — ABNORMAL LOW (ref 3.87–5.11)
RDW: 12.3 % (ref 11.5–15.5)
WBC: 6.1 10*3/uL (ref 4.0–10.5)
nRBC: 0 % (ref 0.0–0.2)

## 2019-06-28 LAB — IRON AND TIBC
Iron: 103 ug/dL (ref 28–170)
Saturation Ratios: 33 % — ABNORMAL HIGH (ref 10.4–31.8)
TIBC: 311 ug/dL (ref 250–450)
UIBC: 208 ug/dL

## 2019-06-28 LAB — FERRITIN: Ferritin: 55 ng/mL (ref 11–307)

## 2019-06-29 LAB — PARATHYROID HORMONE, INTACT (NO CA): PTH: 60 pg/mL (ref 15–65)

## 2019-07-13 LAB — HM DIABETES EYE EXAM

## 2019-07-17 ENCOUNTER — Encounter: Payer: Self-pay | Admitting: Internal Medicine

## 2019-08-24 ENCOUNTER — Encounter: Payer: Self-pay | Admitting: Ophthalmology

## 2019-08-31 ENCOUNTER — Other Ambulatory Visit
Admission: RE | Admit: 2019-08-31 | Discharge: 2019-08-31 | Disposition: A | Discharge status: Home / Self Care | Payer: Medicare HMO | Source: Ambulatory Visit | Attending: Ophthalmology | Admitting: Ophthalmology

## 2019-08-31 ENCOUNTER — Other Ambulatory Visit: Payer: Self-pay

## 2019-08-31 DIAGNOSIS — Z20822 Contact with and (suspected) exposure to covid-19: Secondary | ICD-10-CM | POA: Diagnosis not present

## 2019-08-31 DIAGNOSIS — Z01812 Encounter for preprocedural laboratory examination: Secondary | ICD-10-CM | POA: Insufficient documentation

## 2019-08-31 LAB — SARS CORONAVIRUS 2 (TAT 6-24 HRS): SARS Coronavirus 2: NEGATIVE

## 2019-09-02 ENCOUNTER — Ambulatory Visit: Payer: Medicare HMO | Admitting: Certified Registered"

## 2019-09-02 ENCOUNTER — Ambulatory Visit
Admission: RE | Admit: 2019-09-02 | Discharge: 2019-09-02 | Disposition: A | Discharge status: Home / Self Care | Payer: Medicare HMO | Attending: Ophthalmology | Admitting: Ophthalmology

## 2019-09-02 ENCOUNTER — Encounter
Admission: RE | Disposition: A | Discharge status: Home / Self Care | Payer: Self-pay | Source: Home / Self Care | Attending: Ophthalmology

## 2019-09-02 ENCOUNTER — Other Ambulatory Visit: Payer: Self-pay

## 2019-09-02 ENCOUNTER — Encounter: Payer: Self-pay | Admitting: Ophthalmology

## 2019-09-02 DIAGNOSIS — Z7982 Long term (current) use of aspirin: Secondary | ICD-10-CM | POA: Diagnosis not present

## 2019-09-02 DIAGNOSIS — F329 Major depressive disorder, single episode, unspecified: Secondary | ICD-10-CM | POA: Insufficient documentation

## 2019-09-02 DIAGNOSIS — I129 Hypertensive chronic kidney disease with stage 1 through stage 4 chronic kidney disease, or unspecified chronic kidney disease: Secondary | ICD-10-CM | POA: Insufficient documentation

## 2019-09-02 DIAGNOSIS — Z87891 Personal history of nicotine dependence: Secondary | ICD-10-CM | POA: Diagnosis not present

## 2019-09-02 DIAGNOSIS — H2511 Age-related nuclear cataract, right eye: Secondary | ICD-10-CM | POA: Diagnosis present

## 2019-09-02 DIAGNOSIS — Z881 Allergy status to other antibiotic agents status: Secondary | ICD-10-CM | POA: Insufficient documentation

## 2019-09-02 DIAGNOSIS — Z7984 Long term (current) use of oral hypoglycemic drugs: Secondary | ICD-10-CM | POA: Diagnosis not present

## 2019-09-02 DIAGNOSIS — E7800 Pure hypercholesterolemia, unspecified: Secondary | ICD-10-CM | POA: Insufficient documentation

## 2019-09-02 DIAGNOSIS — E1151 Type 2 diabetes mellitus with diabetic peripheral angiopathy without gangrene: Secondary | ICD-10-CM | POA: Diagnosis not present

## 2019-09-02 DIAGNOSIS — E1136 Type 2 diabetes mellitus with diabetic cataract: Secondary | ICD-10-CM | POA: Insufficient documentation

## 2019-09-02 DIAGNOSIS — N183 Chronic kidney disease, stage 3 unspecified: Secondary | ICD-10-CM | POA: Insufficient documentation

## 2019-09-02 DIAGNOSIS — Z882 Allergy status to sulfonamides status: Secondary | ICD-10-CM | POA: Insufficient documentation

## 2019-09-02 DIAGNOSIS — Z79899 Other long term (current) drug therapy: Secondary | ICD-10-CM | POA: Diagnosis not present

## 2019-09-02 DIAGNOSIS — Z7902 Long term (current) use of antithrombotics/antiplatelets: Secondary | ICD-10-CM | POA: Insufficient documentation

## 2019-09-02 DIAGNOSIS — E1122 Type 2 diabetes mellitus with diabetic chronic kidney disease: Secondary | ICD-10-CM | POA: Insufficient documentation

## 2019-09-02 HISTORY — DX: Anemia, unspecified: D64.9

## 2019-09-02 HISTORY — PX: CATARACT EXTRACTION W/PHACO: SHX586

## 2019-09-02 HISTORY — DX: Essential (primary) hypertension: I10

## 2019-09-02 HISTORY — DX: Acute embolism and thrombosis of unspecified deep veins of unspecified lower extremity: I82.409

## 2019-09-02 HISTORY — DX: Depression, unspecified: F32.A

## 2019-09-02 LAB — GLUCOSE, CAPILLARY: Glucose-Capillary: 143 mg/dL — ABNORMAL HIGH (ref 70–99)

## 2019-09-02 SURGERY — PHACOEMULSIFICATION, CATARACT, WITH IOL INSERTION
Anesthesia: Monitor Anesthesia Care | Site: Eye | Laterality: Right

## 2019-09-02 MED ORDER — MOXIFLOXACIN HCL 0.5 % OP SOLN
OPHTHALMIC | Status: AC
  Filled 2019-09-02: qty 3

## 2019-09-02 MED ORDER — CARBACHOL 0.01 % IO SOLN
INTRAOCULAR | Status: DC | PRN
  Administered 2019-09-02: 0.5 mL via INTRAOCULAR

## 2019-09-02 MED ORDER — SODIUM HYALURONATE 10 MG/ML IO SOLN
INTRAOCULAR | Status: DC | PRN
  Administered 2019-09-02: 0.55 mL via INTRAOCULAR

## 2019-09-02 MED ORDER — SODIUM HYALURONATE 23 MG/ML IO SOLN
INTRAOCULAR | Status: DC | PRN
  Administered 2019-09-02: 0.6 mL via INTRAOCULAR

## 2019-09-02 MED ORDER — DEXMEDETOMIDINE HCL IN NACL 80 MCG/20ML IV SOLN
INTRAVENOUS | Status: AC
  Filled 2019-09-02: qty 20

## 2019-09-02 MED ORDER — TETRACAINE HCL 0.5 % OP SOLN
OPHTHALMIC | Status: AC
  Filled 2019-09-02: qty 4

## 2019-09-02 MED ORDER — MOXIFLOXACIN HCL 0.5 % OP SOLN
OPHTHALMIC | Status: DC | PRN
  Administered 2019-09-02: 0.2 mL via OPHTHALMIC

## 2019-09-02 MED ORDER — LIDOCAINE HCL (PF) 4 % IJ SOLN
INTRAOCULAR | Status: DC | PRN
  Administered 2019-09-02: 4 mL via OPHTHALMIC

## 2019-09-02 MED ORDER — EPINEPHRINE PF 1 MG/ML IJ SOLN: INTRAOCULAR | Status: DC | PRN

## 2019-09-02 MED ORDER — TETRACAINE HCL 0.5 % OP SOLN
1.0000 [drp] | OPHTHALMIC | Status: AC | PRN
  Administered 2019-09-02 (×3): 1 [drp] via OPHTHALMIC

## 2019-09-02 MED ORDER — MOXIFLOXACIN HCL 0.5 % OP SOLN: 1.0000 [drp] | OPHTHALMIC | Status: DC | PRN

## 2019-09-02 MED ORDER — POVIDONE-IODINE 5 % OP SOLN
OPHTHALMIC | Status: DC | PRN
  Administered 2019-09-02: 1 via OPHTHALMIC

## 2019-09-02 MED ORDER — SODIUM CHLORIDE 0.9 % IV SOLN: INTRAVENOUS | Status: DC

## 2019-09-02 MED ORDER — ARMC OPHTHALMIC DILATING DROPS
OPHTHALMIC | Status: AC
  Filled 2019-09-02: qty 0.5

## 2019-09-02 MED ORDER — DEXMEDETOMIDINE HCL IN NACL 200 MCG/50ML IV SOLN
INTRAVENOUS | Status: DC | PRN
Start: 2019-09-02 — End: 2019-09-02
  Administered 2019-09-02: 20 ug via INTRAVENOUS

## 2019-09-02 MED ORDER — ARMC OPHTHALMIC DILATING DROPS
1.0000 "application " | OPHTHALMIC | Status: AC
  Administered 2019-09-02 (×3): 1 via OPHTHALMIC

## 2019-09-02 SURGICAL SUPPLY — 17 items
DISSECTOR HYDRO NUCLEUS 50X22 (MISCELLANEOUS) ×2 IMPLANT
GLOVE BIO SURGEON STRL SZ8 (GLOVE) ×2 IMPLANT
GLOVE BIOGEL M 6.5 STRL (GLOVE) ×2 IMPLANT
GLOVE SURG LX 7.5 STRW (GLOVE) ×1
GLOVE SURG LX STRL 7.5 STRW (GLOVE) ×1 IMPLANT
GOWN STRL REUS W/ TWL LRG LVL3 (GOWN DISPOSABLE) ×2 IMPLANT
GOWN STRL REUS W/TWL LRG LVL3 (GOWN DISPOSABLE) ×2
LABEL CATARACT MEDS ST (LABEL) ×2 IMPLANT
LENS IOL DIOP 22.5 (Intraocular Lens) ×2 IMPLANT
LENS IOL TECNIS MONO 22.5 (Intraocular Lens) ×1 IMPLANT
PACK CATARACT (MISCELLANEOUS) ×2 IMPLANT
PACK CATARACT KING (MISCELLANEOUS) ×2 IMPLANT
PACK EYE AFTER SURG (MISCELLANEOUS) ×2 IMPLANT
SOL BSS BAG (MISCELLANEOUS) ×2
SOLUTION BSS BAG (MISCELLANEOUS) ×1 IMPLANT
WATER STERILE IRR 250ML POUR (IV SOLUTION) ×2 IMPLANT
WIPE NON LINTING 3.25X3.25 (MISCELLANEOUS) ×2 IMPLANT

## 2019-09-02 NOTE — Transfer of Care (Signed)
Immediate Anesthesia Transfer of Care Note  Patient: Misty Villarreal  Procedure(s) Performed: CATARACT EXTRACTION PHACO AND INTRAOCULAR LENS PLACEMENT (IOC) (Right Eye)  Patient Location: Short Stay  Anesthesia Type:MAC  Level of Consciousness: awake  Airway & Oxygen Therapy: Patient Spontanous Breathing  Post-op Assessment: Report given to RN and Post -op Vital signs reviewed and stable  Post vital signs: Reviewed and stable  Last Vitals:  Vitals Value Taken Time  BP 105/66 09/02/19 0825  Temp 36.5 C 09/02/19 0825  Pulse 65 09/02/19 0825  Resp 16 09/02/19 0825  SpO2 100 % 09/02/19 0825    Last Pain:  Vitals:   09/02/19 0825  TempSrc: Temporal  PainSc: 0-No pain         Complications: No complications documented.

## 2019-09-02 NOTE — Discharge Instructions (Addendum)
Eye Surgery Discharge Instructions  Expect mild scratchy sensation or mild soreness. DO NOT RUB YOUR EYE!  The day of surgery: . Minimal physical activity, but bed rest is not required . No reading, computer work, or close hand work . No bending, lifting, or straining. . May watch TV  For 24 hours: . No driving, legal decisions, or alcoholic beverages . Safety precautions . Eat anything you prefer: It is better to start with liquids, then soup then solid foods. . Solar shield eyeglasses should be worn for comfort in the sunlight/patch while sleeping  Resume all regular medications including aspirin or Coumadin if these were discontinued prior to surgery. You may shower, bathe, shave, or wash your hair. Tylenol may be taken for mild discomfort. FOLLOW DR. Melony Overly EYE DROP INSTRUCTION SHEET AS REVIEWED.  Call your doctor if you experience significant pain, nausea, or vomiting, fever > 101 or other signs of infection. [phone] or [phone] Specific instructions:   Follow-up Information    Eulogio Bear, MD Follow up.   Specialty: Ophthalmology Why: 09/03/19 @ 10:45 am  [facility] OFFICE Contact information: [address] [place] [address] [phone]

## 2019-09-02 NOTE — Anesthesia Postprocedure Evaluation (Signed)
Anesthesia Post Note  Patient: Daleen Steinhaus Sievers  Procedure(s) Performed: CATARACT EXTRACTION PHACO AND INTRAOCULAR LENS PLACEMENT (IOC) (Right Eye)  Patient location: Phase II. Anesthesia Type: MAC Level of consciousness: awake and alert Pain management: pain level controlled Vital Signs Assessment: post-procedure vital signs reviewed and stable Respiratory status: spontaneous breathing, nonlabored ventilation, respiratory function stable and patient connected to nasal cannula oxygen Cardiovascular status: stable and blood pressure returned to baseline Postop Assessment: no apparent nausea or vomiting Anesthetic complications: no   No complications documented.   Last Vitals:  Vitals:   09/02/19 0645 09/02/19 0825  BP: (!) 143/56 105/66  Pulse: 83 65  Resp: 16 16  Temp:  36.5 C  SpO2: 98% 100%    Last Pain:  Vitals:   09/02/19 0825  TempSrc: Temporal  PainSc: 0-No pain                 Precious Haws Damyan Corne

## 2019-09-02 NOTE — Op Note (Signed)
OPERATIVE NOTE  Misty Villarreal [id number] 09/02/2019   PREOPERATIVE DIAGNOSIS:  Nuclear sclerotic cataract right eye.  H25.11   POSTOPERATIVE DIAGNOSIS:    Nuclear sclerotic cataract right eye.     PROCEDURE:  Phacoemusification with posterior chamber intraocular lens placement of the right eye   LENS:   Implant Name Type Inv. Item Serial No. Manufacturer Lot No. LRB No. Used Action  LENS IOL DIOP 22.5 - [id number] Intraocular Lens LENS IOL DIOP 22.5 [id number] [employer]  Right 1 Implanted       Procedure(s) with comments: CATARACT EXTRACTION PHACO AND INTRAOCULAR LENS PLACEMENT (IOC) (Right) - [place] 00:27.3 CDE 1.92 AP% Fluid Pack lot # [id number]  DCB00 +22.5   ULTRASOUND TIME: 0 minutes 27 seconds.  CDE 1.92   SURGEON:  Benay Pillow, MD, MPH  ANESTHESIOLOGIST: Anesthesiologist: Piscitello, Precious Haws, MD CRNA: Gaynelle Cage, CRNA   ANESTHESIA:  Topical with tetracaine drops augmented with 1% preservative-free intracameral lidocaine.  ESTIMATED BLOOD LOSS: less than 1 mL.   COMPLICATIONS:  None.   DESCRIPTION OF PROCEDURE:  The patient was identified in the holding room and transported to the operating room and placed in the supine position under the operating microscope.  The right eye was identified as the operative eye and it was prepped and draped in the usual sterile ophthalmic fashion.   A 1.0 millimeter clear-corneal paracentesis was made at the 10:30 position. 0.5 ml of preservative-free 1% lidocaine with epinephrine was injected into the anterior chamber.  The anterior chamber was filled with Healon 5 viscoelastic.  A 2.4 millimeter keratome was used to make a near-clear corneal incision at the 8:00 position.  A curvilinear capsulorrhexis was made with a cystotome and capsulorrhexis forceps.  Balanced salt solution was used to hydrodissect and hydrodelineate the nucleus.   Phacoemulsification was then used in stop and chop fashion to remove the  lens nucleus and epinucleus.  The remaining cortex was then removed using the irrigation and aspiration handpiece. Healon was then placed into the capsular bag to distend it for lens placement.  A lens was then injected into the capsular bag.  The remaining viscoelastic was aspirated.   Wounds were hydrated with balanced salt solution.  The anterior chamber was inflated to a physiologic pressure with balanced salt solution.   Intracameral vigamox 0.1 mL undiluted was injected into the eye and a drop placed onto the ocular surface.  No wound leaks were noted.  The patient was taken to the recovery room in stable condition without complications of anesthesia or surgery  Benay Pillow 09/02/2019, 8:24 AM

## 2019-09-02 NOTE — H&P (Signed)

## 2019-09-02 NOTE — Anesthesia Preprocedure Evaluation (Signed)
Anesthesia Evaluation  Patient identified by MRN, date of birth, ID band Patient awake    Reviewed: Allergy & Precautions, H&P , NPO status , Patient's Chart, lab work & pertinent test results  Airway Mallampati: III  TM Distance: >3 FB Neck ROM: full    Dental  (+) Chipped   Pulmonary neg pulmonary ROS, neg shortness of breath, former smoker,    Pulmonary exam normal        Cardiovascular Exercise Tolerance: Good hypertension, + Peripheral Vascular Disease  Normal cardiovascular exam     Neuro/Psych PSYCHIATRIC DISORDERS negative neurological ROS  negative psych ROS   GI/Hepatic negative GI ROS, Neg liver ROS, neg GERD  ,  Endo/Other  diabetes, Type 2  Renal/GU Renal disease     Musculoskeletal  (+) Arthritis ,   Abdominal   Peds  Hematology negative hematology ROS (+)   Anesthesia Other Findings Past Medical History: 09/01/2014: Abnormal glandular Papanicolaou smear of cervix     Comment:  Normal Pap 2012  No date: Anemia No date: Chronic kidney disease     Comment:  stage 3 No date: Depression No date: Diabetes mellitus without complication (HCC) No date: DVT (deep venous thrombosis) (HCC) No date: Hyperlipidemia No date: Hypertension No date: Peripheral vascular disease ([place]) No date: Peripheral vascular disease ([place]) 12/03/2016: Slurred speech  Past Surgical History: 2014, 2013: ANGIOPLASTY / STENTING FEMORAL; Left 1988: BRAIN MENINGIOMA EXCISION 1989: BREAST BIOPSY; Right     Comment:  benign 1980: CARPAL TUNNEL RELEASE; Left No date: CESAREAN SECTION 1987: CHOLECYSTECTOMY 05/08/2017: COLONOSCOPY WITH PROPOFOL; N/A     Comment:  Procedure: COLONOSCOPY WITH PROPOFOL;  Surgeon: Jonathon Bellows, MD;  Location: [facility] ENDOSCOPY;  Service:               Gastroenterology;  Laterality: N/A; 2009: KNEE ARTHROPLASTY; Right 07/21/2018: LOWER EXTREMITY ANGIOGRAPHY; Left     Comment:  Procedure:  LOWER EXTREMITY ANGIOGRAPHY;  Surgeon:               Katha Cabal, MD;  Location: [facility];               Service: Cardiovascular;  Laterality: Left;  BMI    Body Mass Index: 32.66 kg/m      Reproductive/Obstetrics negative OB ROS                             Anesthesia Physical Anesthesia Plan  ASA: III  Anesthesia Plan: MAC   Post-op Pain Management:    Induction: Intravenous  PONV Risk Score and Plan:   Airway Management Planned: Natural Airway and Nasal Cannula  Additional Equipment:   Intra-op Plan:   Post-operative Plan:   Informed Consent: I have reviewed the patients History and Physical, chart, labs and discussed the procedure including the risks, benefits and alternatives for the proposed anesthesia with the patient or authorized representative who has indicated his/her understanding and acceptance.     Dental Advisory Given  Plan Discussed with: Anesthesiologist, CRNA and Surgeon  Anesthesia Plan Comments: (Patient consented for risks of anesthesia including but not limited to:  - adverse reactions to medications - damage to eyes, teeth, lips or other oral mucosa - nerve damage due to positioning  - sore throat or hoarseness - Damage to heart, brain, nerves, lungs, other parts of body or loss of life  Patient voiced  understanding.)        Anesthesia Quick Evaluation

## 2019-09-03 ENCOUNTER — Other Ambulatory Visit: Payer: Self-pay | Admitting: Internal Medicine

## 2019-09-03 DIAGNOSIS — F32A Depression, unspecified: Secondary | ICD-10-CM

## 2019-09-03 DIAGNOSIS — F329 Major depressive disorder, single episode, unspecified: Secondary | ICD-10-CM

## 2019-09-03 NOTE — Telephone Encounter (Signed)
Pt has appt on 09/13/19

## 2019-09-03 NOTE — Telephone Encounter (Signed)
Requested medication (s) are due for refill today: Yes  Requested medication (s) are on the active medication list: Yes  Last refill:  09/19/18  Future visit scheduled: Yes  Notes to clinic:  Prescription expires in 2 weeks.    Requested Prescriptions  Pending Prescriptions Disp Refills   sertraline (ZOLOFT) 50 MG tablet [Pharmacy Med Name: SERTRALINE HCL TABS 50MG ] 90 tablet 3    Sig: TAKE 1 TABLET DAILY      Psychiatry:  Antidepressants - SSRI Failed - 09/03/2019  1:03 PM      Failed - Valid encounter within last 6 months    Recent Outpatient Visits           8 months ago Type II diabetes mellitus with complication [facility])   [facility] Glean Hess, MD   11 months ago Annual physical exam   [facility] Glean Hess, MD   1 year ago Nocturnal leg cramps   [facility] Glean Hess, MD   1 year ago Encounter for screening mammogram for breast cancer   [facility] Glean Hess, MD   2 years ago Annual physical exam   [facility] Glean Hess, MD       Future Appointments             In 1 week Army Melia Jesse Sans, MD [place] - Completed PHQ-2 or PHQ-9 in the last 360 days.

## 2019-09-06 ENCOUNTER — Other Ambulatory Visit: Payer: Self-pay

## 2019-09-06 ENCOUNTER — Inpatient Hospital Stay: Payer: Medicare HMO | Attending: Nurse Practitioner

## 2019-09-06 DIAGNOSIS — E538 Deficiency of other specified B group vitamins: Secondary | ICD-10-CM | POA: Insufficient documentation

## 2019-09-06 DIAGNOSIS — R5383 Other fatigue: Secondary | ICD-10-CM | POA: Insufficient documentation

## 2019-09-06 DIAGNOSIS — N1832 Chronic kidney disease, stage 3b: Secondary | ICD-10-CM | POA: Insufficient documentation

## 2019-09-06 DIAGNOSIS — D801 Nonfamilial hypogammaglobulinemia: Secondary | ICD-10-CM | POA: Insufficient documentation

## 2019-09-06 DIAGNOSIS — D509 Iron deficiency anemia, unspecified: Secondary | ICD-10-CM | POA: Insufficient documentation

## 2019-09-06 LAB — IRON AND TIBC
Iron: 92 ug/dL (ref 28–170)
Saturation Ratios: 25 % (ref 10.4–31.8)
TIBC: 375 ug/dL (ref 250–450)
UIBC: 283 ug/dL

## 2019-09-06 LAB — FERRITIN: Ferritin: 50 ng/mL (ref 11–307)

## 2019-09-06 NOTE — Progress Notes (Signed)
[facility]  [address], [address] [place], [place] [address] Phone: [phone]  Fax: [phone]   Clinic Day:  09/07/2019  Referring physician: Glean Hess, MD  Chief Complaint: Misty Villarreal is a 69 y.o. female with stage IIIBchronic kidney disease, anemia, and hypogammaglobulinemia who is seen for 6 month assessment.  HPI:  The patient was last seen in the hematology clinic on 03/09/2019 via telemedicine. At that time, she felt fatigued. Hematocrit was 34.5, hemoglobin 11.6, MCV 87.6, platelets 265,000, WBC 7,300. Ferritin was 100 with an iron saturation of 33% and a TIBC of 356. She continued oral B12.  The patient saw Dr. Holley Raring on 07/15/2019. Her chronic kidney disease was felt to be stable. Her anemia had improved with dietary control. Follow up was planned in 4 months.  The patient underwent right cataract surgery on 09/02/2019 with Dr. Edison Pace.  Labs on 06/28/2019 revealed a hematocrit of 32.8, hemoglobin 11.0, MCV 89.6, platelets 295,000, WBC 6,100. Creatinine was 1.43 (CrCl 37 ml/min). Ferritin 55 with an iron saturation of 33% and TIBC of 311.  During the interim, she has felt "tired." She wakes up feeling refreshed but runs out of energy quickly throughout the day. She takes 2-hour naps almost daily, which sometimes help and sometimes don't. She wakes up at 7:30am and is ready for bed around 8:00pm. She states that she thinks she may have "a touch of depression." This tiredness started after she received her COVID-19 vaccine. The patient denies chest pain and shortness of breath.  The patient takes oral Vitamin B12 and a multivitamin. She cannot tolerate oral iron because of nausea, vomiting, and diarrhea.   Past Medical History:  Diagnosis Date  . Abnormal glandular Papanicolaou smear of cervix 09/01/2014   Normal Pap 2012   . Anemia   . Chronic kidney disease    stage 3  . Depression   . Diabetes mellitus without complication  ([place])   . DVT (deep venous thrombosis) ([place])   . Hyperlipidemia   . Hypertension   . Peripheral vascular disease ([place])   . Peripheral vascular disease ([place])   . Slurred speech 12/03/2016    Past Surgical History:  Procedure Laterality Date  . ANGIOPLASTY / STENTING FEMORAL Left 2014, 2013  . BRAIN MENINGIOMA EXCISION  1988  . BREAST BIOPSY Right 1989   benign  . CARPAL TUNNEL RELEASE Left 1980  . CATARACT EXTRACTION W/PHACO Right 09/02/2019   Procedure: CATARACT EXTRACTION PHACO AND INTRAOCULAR LENS PLACEMENT (IOC);  Surgeon: Eulogio Bear, MD;  Location: [facility] ORS;  Service: Ophthalmology;  Laterality: Right;  [place] 00:27.3 CDE 1.92 AP% Fluid Pack lot # [id number]  . CESAREAN SECTION    . CHOLECYSTECTOMY  1987  . COLONOSCOPY WITH PROPOFOL N/A 05/08/2017   Procedure: COLONOSCOPY WITH PROPOFOL;  Surgeon: Jonathon Bellows, MD;  Location: [facility] ENDOSCOPY;  Service: Gastroenterology;  Laterality: N/A;  . KNEE ARTHROPLASTY Right 2009  . LOWER EXTREMITY ANGIOGRAPHY Left 07/21/2018   Procedure: LOWER EXTREMITY ANGIOGRAPHY;  Surgeon: Katha Cabal, MD;  Location: [facility];  Service: Cardiovascular;  Laterality: Left;    Family History  Problem Relation Age of Onset  . Diabetes Mother   . Heart failure Father   . Diabetes Father   . Breast cancer Neg Hx     Social History:  reports that she quit smoking about 6 years ago. She has never used smokeless tobacco. She reports current alcohol use of about 2.0 standard drinks of alcohol per  week. She reports that she does not use drugs. She previously smoked a pack of cigarettes a day for 40 years. She drinks red wine occasionally.He denies any exposure to radiation or toxins. She is retired. 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 Her daughter's name is [name]. Her husband, [name], died in 30-Jun-2018. She lives in [place]. The patient is alone today.  Allergies:  Allergies  Allergen Reactions  .  Augmentin [Amoxicillin-Pot Clavulanate] Diarrhea  . Tetracycline     Other reaction(s): emesis  . Cefaclor Rash  . Cephalexin Rash  . Sulfa Antibiotics Rash    Other reaction(s): emesis    Current Medications: Current Outpatient Medications  Medication Sig Dispense Refill  . aspirin 81 MG chewable tablet Chew 81 mg by mouth daily.     Marland Kitchen atorvastatin (LIPITOR) 40 MG tablet TAKE 1 TABLET DAILY (Patient taking differently: Take 40 mg by mouth daily. ) 90 tablet 3  . clopidogrel (PLAVIX) 75 MG tablet TAKE 1 TABLET DAILY (Patient taking differently: Take 75 mg by mouth daily. ) 90 tablet 3  . Cyanocobalamin (VITAMIN B 12 PO) Take 1,000 mcg by mouth daily. 1000mg  daily     . fexofenadine (ALLEGRA) 180 MG tablet Take 180 mg by mouth daily.     Marland Kitchen lisinopril (ZESTRIL) 20 MG tablet TAKE 1 TABLET DAILY (REPLACES LISINOPRIL HCT) (Patient taking differently: Take 20 mg by mouth daily. ) 90 tablet 3  . meclizine (ANTIVERT) 12.5 MG tablet Take 1 tablet (12.5 mg total) by mouth 2 (two) times daily as needed for dizziness. 60 tablet 2  . metFORMIN (GLUCOPHAGE) 500 MG tablet TAKE 1 TABLET TWICE A DAY (Patient taking differently: Take 500 mg by mouth 2 (two) times daily with a meal. ) 180 tablet 3  . Multiple Vitamin tablet Take 1 tablet by mouth daily.     . sertraline (ZOLOFT) 50 MG tablet TAKE 1 TABLET DAILY (Patient taking differently: Take 50 mg by mouth daily. ) 90 tablet 3   No current facility-administered medications for this visit.    Review of Systems  Constitutional: Positive for malaise/fatigue (no energy, tired). Negative for chills, diaphoresis, fever and weight loss (fluctuates).       Feels "tired."  HENT: Negative.  Negative for congestion, ear discharge, ear pain, hearing loss, nosebleeds, sinus pain, sore throat and tinnitus.   Eyes: Negative for blurred vision, double vision and photophobia.       S/p right cataract surgery  Respiratory: Negative for cough, hemoptysis, sputum  production and shortness of breath.   Cardiovascular: Negative for chest pain, palpitations, orthopnea and leg swelling.       Multiple blockages in legs on Plavix.  Gastrointestinal: Negative for abdominal pain, blood in stool, constipation, diarrhea (on Imodium), heartburn, melena, nausea and vomiting.       Eating well.  IBS.  No prior EGD.  Genitourinary: Negative.  Negative for dysuria, frequency, hematuria and urgency.  Musculoskeletal: Negative for back pain, joint pain, myalgias and neck pain.       Chronic leg pain.  Skin: Negative.  Negative for itching and rash.  Neurological: Negative.  Negative for dizziness, tingling, tremors, sensory change, speech change, focal weakness, weakness and headaches.  Endo/Heme/Allergies: Positive for environmental allergies (on Allegra). Does not bruise/bleed easily.  Psychiatric/Behavioral: Positive for depression. Negative for hallucinations and memory loss. The patient is not nervous/anxious and does not have insomnia.   All other systems reviewed and are negative.  Performance status (ECOG):  1-2  Vitals: Blood pressure 134/65, pulse 87, temperature (!) 97.2 F (36.2 C), temperature source Tympanic, resp. rate 18, height 5\' 2"  (1.575 m), weight 184 lb 4.9 oz (83.6 kg), SpO2 100 %.   Physical Exam Vitals and nursing note reviewed.  Constitutional:      General: She is not in acute distress.    Appearance: She is well-developed. She is not diaphoretic.  HENT:     Head: Normocephalic and atraumatic.     Comments: Short gray hair.    Mouth/Throat:     Mouth: Mucous membranes are moist.     Pharynx: Oropharynx is clear.  Eyes:     General: No scleral icterus.    Extraocular Movements: Extraocular movements intact.     Conjunctiva/sclera: Conjunctivae normal.     Pupils: Pupils are equal, round, and reactive to light.     Comments: Glasses.  Blue eyes.  Cardiovascular:     Rate and Rhythm: Normal rate and regular rhythm.     Heart  sounds: Normal heart sounds. No murmur heard.   Pulmonary:     Effort: Pulmonary effort is normal. No respiratory distress.     Breath sounds: Normal breath sounds. No wheezing or rales.  Chest:     Chest wall: No tenderness.  Abdominal:     General: Bowel sounds are normal. There is no distension.     Palpations: Abdomen is soft. There is no hepatomegaly, splenomegaly or mass.     Tenderness: There is no abdominal tenderness. There is no guarding or rebound.  Musculoskeletal:        General: No swelling or tenderness. Normal range of motion.     Cervical back: Normal range of motion and neck supple.  Lymphadenopathy:     Head:     Right side of head: No preauricular, posterior auricular or occipital adenopathy.     Left side of head: No preauricular, posterior auricular or occipital adenopathy.     Cervical: No cervical adenopathy.     Upper Body:     Right upper body: No supraclavicular or axillary adenopathy.     Left upper body: No supraclavicular or axillary adenopathy.     Lower Body: No right inguinal adenopathy. No left inguinal adenopathy.  Skin:    General: Skin is warm and dry.  Neurological:     Mental Status: She is alert and oriented to person, place, and time.  Psychiatric:        Behavior: Behavior normal.        Thought Content: Thought content normal.        Judgment: Judgment normal.    Orders Only on 09/07/2019  Component Date Value Ref Range Status  . WBC 09/06/2019 7.3  4.0 - 10.5 K/uL Final  . RBC 09/06/2019 3.81* 3.87 - 5.11 MIL/uL Final  . Hemoglobin 09/06/2019 11.6* 12.0 - 15.0 g/dL Final  . HCT 09/06/2019 35.4* 36 - 46 % Final  . MCV 09/06/2019 92.9  80.0 - 100.0 fL Final  . MCH 09/06/2019 30.4  26.0 - 34.0 pg Final  . MCHC 09/06/2019 32.8  30.0 - 36.0 g/dL Final  . RDW 09/06/2019 12.5  11.5 - 15.5 % Final  . Platelets 09/06/2019 316  150 - 400 K/uL Final  . nRBC 09/06/2019 0.0  0.0 - 0.2 % Final  . Neutrophils Relative % 09/06/2019 65  % Final    . Neutro Abs 09/06/2019 4.8  1.7 - 7.7 K/uL Final  . Lymphocytes Relative 09/06/2019 23  %  Final  . Lymphs Abs 09/06/2019 1.7  0.7 - 4.0 K/uL Final  . Monocytes Relative 09/06/2019 8  % Final  . Monocytes Absolute 09/06/2019 0.6  0 - 1 K/uL Final  . Eosinophils Relative 09/06/2019 3  % Final  . Eosinophils Absolute 09/06/2019 0.2  0 - 0 K/uL Final  . Basophils Relative 09/06/2019 1  % Final  . Basophils Absolute 09/06/2019 0.1  0 - 0 K/uL Final  . Immature Granulocytes 09/06/2019 0  % Final  . Abs Immature Granulocytes 09/06/2019 0.02  0.00 - 0.07 K/uL Final   Performed at [facility], [address], [place], [place] [address]  Appointment on 09/06/2019  Component Date Value Ref Range Status  . Iron 09/06/2019 92  28 - 170 ug/dL Final  . TIBC 09/06/2019 375  250 - 450 ug/dL Final  . Saturation Ratios 09/06/2019 25  10.4 - 31.8 % Final  . UIBC 09/06/2019 283  ug/dL Final   Performed at [facility], [address] [place], [place] [address]  . Ferritin 09/06/2019 50  11 - 307 ng/mL Final   Performed at [facility], [address] [place], [place] [address]    Assessment:  Misty Villarreal is a 69 y.o. female with stage IIIBchronic kidney diseaseand a normocytic anemiaand hypogammaglobulinemia.Dietis fair. She is intolerant of oral iron.  Labs on 08/28/2020revealed no M-spike but hypogammaglobulinemia. Random UPEP and ANAwere negative. Labs on 10/02/2020revealed a hematocrit 29.7, hemoglobin 9.8, MCV 88.2, platelets 309,000,and WBC7,200. Albumen and calcium were normal. Iron saturation 18% with a TIBC 357.  Work upon 11/11/2018:hematocrit 32.2, hemoglobin 10.7, MCV 89.2, platelets 303,000,andWBC 6,000. Ferritinwas11(low).B12was 104 (low). Normal studiesincluded: folate,TSH, SPEP, immunoglobulin levels, free light chain ratio,24 hour UPEP and free light chains.  She received Venofer weekly x 3 (12/15/2018 -  12/29/2018).  Ferritin has been followed: 25 on 06/21/2015, 11 on 11/11/2018, 24 on 12/14/2018, 124 on 01/26/2019, 100 on 03/08/2019, 55 on 06/28/2019, and 50 on 09/06/2019.  She is on oral iron.  She has B12 deficiency.  B12 was 104 on 11/11/2018 and 812 on 12/14/2018.  She is on oral B12.  Anti-parietal antibody and intrinsic factor antibody were normal on 12/14/2018.   She has stage IIIB chronic kidney diseasefelt secondary to diabetes. Creatinine was 1.36 on 10/23/2018 (CrCl 39 ml/min).   Colonoscopyon 05/08/2017 revealed diverticulosis and non-bleeding internal hemorrhoids.  The patient received both doses of the COVID-19 vaccine in 03/2019.  Symptomatically, she has felt "tired." She wakes up feeling refreshed but runs out of energy quickly throughout the day. She takes 2-hour naps during the day.  Exam is stable.  Plan: 1.   Review labs from 09/06/2019. 2.   Iron deficiency anemia  Hematocrit 34.5. Hemoglobin 11.6. MCV 87.6 on 03/08/2019. Ferritin 100.  Hematocrit 35.4.  Hemoglobin 11.6.  MCV 92.9 on 09/06/2019.   Ferritin 50 with an iron saturation of 25% and a TIBC of 375. She denies any bleeding.             Last colonoscopy was in 2019.She has never had an EGD. No Venofer needed.  Continue to monitor. 3.B12 deficiency B12 was 104 on 11/11/2018 and 812 on 12/14/2018. She remains on oral B12. B12 goal is 400. No evidence of pernicious anemia.   Monitor folate annually. 4.   Fatigue  Etiology unclear.  She relates the timing of her fatigue to post COVID-19 vaccine.  She denies any sleep apnea (consider overnight oximetry). 5.   RTC in  2 months for labs (CBC with diff, ferritin, iron studies, B12, folate). 6.   RTC in 4 months for MD assessment, labs (CBC with differential, ferritin, iron studies- day before) and +/- Venofer.  I discussed the assessment and  treatment plan with the patient.  The patient was provided an opportunity to ask questions and all were answered.  The patient agreed with the plan and demonstrated an understanding of the instructions.  The patient was advised to call back if the symptoms worsen or if the condition fails to improve as anticipated.  I provided 14 minutes of face-to-face time during this this encounter and > 50% was spent counseling as documented under my assessment and plan.  An additional 5 minutes were spent reviewing her chart (Epic and Care Everywhere) including notes, labs, and imaging studies.    Lequita Asal, MD, PhD    09/07/2019, 10:44 AM  I, Mirian Mo Tufford, am acting as Education administrator for [employer]. Mike Gip, MD, PhD.  I, Jadis Pitter C. Mike Gip, MD, have reviewed the above documentation for accuracy and completeness, and I agree with the above.

## 2019-09-07 ENCOUNTER — Inpatient Hospital Stay: Payer: Medicare HMO | Admitting: Hematology and Oncology

## 2019-09-07 ENCOUNTER — Inpatient Hospital Stay: Payer: Medicare HMO

## 2019-09-07 ENCOUNTER — Other Ambulatory Visit: Payer: Self-pay

## 2019-09-07 ENCOUNTER — Encounter: Payer: Self-pay | Admitting: Hematology and Oncology

## 2019-09-07 VITALS — BP 134/65 | HR 87 | Temp 97.2°F | Resp 18 | Ht 62.0 in | Wt 184.3 lb

## 2019-09-07 DIAGNOSIS — E538 Deficiency of other specified B group vitamins: Secondary | ICD-10-CM

## 2019-09-07 DIAGNOSIS — N1832 Chronic kidney disease, stage 3b: Secondary | ICD-10-CM

## 2019-09-07 DIAGNOSIS — R5383 Other fatigue: Secondary | ICD-10-CM

## 2019-09-07 DIAGNOSIS — D509 Iron deficiency anemia, unspecified: Secondary | ICD-10-CM | POA: Diagnosis not present

## 2019-09-07 DIAGNOSIS — D631 Anemia in chronic kidney disease: Secondary | ICD-10-CM

## 2019-09-07 LAB — CBC WITH DIFFERENTIAL/PLATELET
Abs Immature Granulocytes: 0.02 10*3/uL (ref 0.00–0.07)
Basophils Absolute: 0.1 10*3/uL (ref 0.0–0.1)
Basophils Relative: 1 %
Eosinophils Absolute: 0.2 10*3/uL (ref 0.0–0.5)
Eosinophils Relative: 3 %
HCT: 35.4 % — ABNORMAL LOW (ref 36.0–46.0)
Hemoglobin: 11.6 g/dL — ABNORMAL LOW (ref 12.0–15.0)
Immature Granulocytes: 0 %
Lymphocytes Relative: 23 %
Lymphs Abs: 1.7 10*3/uL (ref 0.7–4.0)
MCH: 30.4 pg (ref 26.0–34.0)
MCHC: 32.8 g/dL (ref 30.0–36.0)
MCV: 92.9 fL (ref 80.0–100.0)
Monocytes Absolute: 0.6 10*3/uL (ref 0.1–1.0)
Monocytes Relative: 8 %
Neutro Abs: 4.8 10*3/uL (ref 1.7–7.7)
Neutrophils Relative %: 65 %
Platelets: 316 10*3/uL (ref 150–400)
RBC: 3.81 MIL/uL — ABNORMAL LOW (ref 3.87–5.11)
RDW: 12.5 % (ref 11.5–15.5)
WBC: 7.3 10*3/uL (ref 4.0–10.5)
nRBC: 0 % (ref 0.0–0.2)

## 2019-09-07 LAB — TSH: TSH: 2.575 u[IU]/mL (ref 0.350–4.500)

## 2019-09-07 NOTE — Progress Notes (Signed)
No new changes noted today 

## 2019-09-12 NOTE — Progress Notes (Signed)
Date:  09/13/2019   Name:  Misty Villarreal   DOB:  [birth year]   MRN:  [id number]   Chief Complaint: Annual Exam (Breast Exam. No pap- aged out.)  Misty Villarreal is a 69 y.o. female who presents today for her Complete Annual Exam. She feels well. She reports exercising - none at this time. Gardening and working in the yard. She reports she is sleeping well. Breast complaints - none.  Mammogram: 01/2018 DEXA: 01/2018 osteopenia hip Pap smear: discontinued Colonoscopy: 04/2017 normal  Immunization History  Administered Date(s) Administered  . Influenza, High Dose Seasonal PF 11/13/2016, 11/12/2018  . Influenza-Unspecified 11/21/2016, 12/18/2017  . PFIZER SARS-COV-2 Vaccination 03/24/2019, 04/14/2019  . Pneumococcal Conjugate-13 08/04/2015  . Pneumococcal Polysaccharide-23 12/15/2012, 09/09/2018  . Zoster Recombinat (Shingrix) 12/04/2017, 11/12/2018    Hypertension This is a chronic problem. The problem is controlled. Pertinent negatives include no chest pain, headaches, palpitations or shortness of breath. Past treatments include ACE inhibitors. The current treatment provides significant improvement. There are no compliance problems.  Hypertensive end-organ damage includes kidney disease.  Diabetes She presents for her follow-up diabetic visit. She has type 2 diabetes mellitus. Her disease course has been stable. Pertinent negatives for hypoglycemia include no dizziness, headaches, nervousness/anxiousness or tremors. Pertinent negatives for diabetes include no chest pain, no fatigue, no polydipsia and no polyuria. Current diabetic treatment includes oral agent (monotherapy). She is compliant with treatment all of the time. She monitors blood glucose at home 1-2 x per week. Her breakfast blood glucose is taken between 7-8 am. Her breakfast blood glucose range is generally 110-130 mg/dl. An ACE inhibitor/angiotensin II receptor blocker is being taken. Eye exam is current.    Hyperlipidemia The problem is controlled. Pertinent negatives include no chest pain or shortness of breath. Current antihyperlipidemic treatment includes statins. The current treatment provides significant improvement of lipids. There are no compliance problems.   Depression        This is a chronic problem.  The problem has been resolved since onset.  Associated symptoms include no fatigue and no headaches.  Past treatments include SSRIs - Selective serotonin reuptake inhibitors.   Lab Results  Component Value Date   CREATININE 1.43 (H) 06/28/2019   BUN 22 06/28/2019   NA 137 06/28/2019   K 5.0 06/28/2019   CL 104 06/28/2019   CO2 24 06/28/2019   Lab Results  Component Value Date   CHOL 162 09/09/2018   HDL 57 09/09/2018   LDLCALC 77 09/09/2018   TRIG 142 09/09/2018   CHOLHDL 2.8 09/09/2018   Lab Results  Component Value Date   TSH 2.575 09/06/2019   Lab Results  Component Value Date   HGBA1C 6.5 (H) 12/23/2018   Lab Results  Component Value Date   WBC 7.3 09/06/2019   HGB 11.6 (L) 09/06/2019   HCT 35.4 (L) 09/06/2019   MCV 92.9 09/06/2019   PLT 316 09/06/2019   Lab Results  Component Value Date   ALT 11 09/09/2018   AST 16 09/09/2018   ALKPHOS 87 09/09/2018   BILITOT 0.7 09/09/2018     Review of Systems  Constitutional: Negative for chills, fatigue and fever.  HENT: Negative for congestion, hearing loss, tinnitus, trouble swallowing and voice change.   Eyes: Negative for visual disturbance (recent cataract surgery).  Respiratory: Negative for cough, chest tightness, shortness of breath and wheezing.   Cardiovascular: Negative for chest pain, palpitations and leg swelling.  Gastrointestinal: Negative for abdominal pain, constipation, diarrhea and vomiting.  Endocrine: Negative for polydipsia and polyuria.  Genitourinary: Negative for dysuria, frequency, genital sores, vaginal bleeding and vaginal discharge.  Musculoskeletal: Negative for arthralgias, gait  problem and joint swelling.  Skin: Negative for color change and rash.  Neurological: Negative for dizziness, tremors, light-headedness and headaches.  Hematological: Negative for adenopathy. Does not bruise/bleed easily.  Psychiatric/Behavioral: Positive for depression. Negative for dysphoric mood and sleep disturbance. The patient is not nervous/anxious.     Patient Active Problem List   Diagnosis Date Noted  . Other fatigue 09/07/2019  . Stage 3b chronic kidney disease 12/23/2018  . Restless leg syndrome 12/23/2018  . Unruptured synovial cyst of popliteal space 12/23/2018  . Plantar fasciitis of left foot 12/23/2018  . Iron deficiency anemia 11/26/2018  . B12 deficiency 11/26/2018  . Hypogammaglobulinemia ([place]) 11/11/2018  . Anemia due to stage 3b chronic kidney disease 11/11/2018  . Osteopenia determined by x-ray 02/16/2018  . Major depressive disorder with single episode, in partial remission ([place]) 08/04/2017  . Neck pain on right side 08/05/2016  . Tinnitus of right ear 08/05/2016  . Type II diabetes mellitus with complication ([place]) 59/93/5701  . Localized edema 06/21/2015  . History of paroxysmal supraventricular tachycardia 06/21/2015  . PAD (peripheral artery disease) ([place]) 02/02/2015  . Hyperlipidemia associated with type 2 diabetes mellitus ([place]) 09/01/2014  . Neuropathy 09/01/2014  . Phlebectasia 09/01/2014  . Essential (primary) hypertension 09/01/2014  . Spondylolisthesis at L4-L5 level 07/05/2014  . Arthritis, degenerative 01/31/2014    Allergies  Allergen Reactions  . Augmentin [Amoxicillin-Pot Clavulanate] Diarrhea  . Tetracycline     Other reaction(s): emesis  . Cefaclor Rash  . Cephalexin Rash  . Sulfa Antibiotics Rash    Other reaction(s): emesis    Past Surgical History:  Procedure Laterality Date  . ANGIOPLASTY / STENTING FEMORAL Left 2014, 2013  . BRAIN MENINGIOMA EXCISION  1988  . BREAST BIOPSY Right 1989   benign  . CARPAL TUNNEL RELEASE  Left 1980  . CATARACT EXTRACTION W/PHACO Right 09/02/2019   Procedure: CATARACT EXTRACTION PHACO AND INTRAOCULAR LENS PLACEMENT (IOC);  Surgeon: Eulogio Bear, MD;  Location: [facility] ORS;  Service: Ophthalmology;  Laterality: Right;  [place] 00:27.3 CDE 1.92 AP% Fluid Pack lot # [id number]  . CESAREAN SECTION    . CHOLECYSTECTOMY  1987  . COLONOSCOPY WITH PROPOFOL N/A 05/08/2017   Procedure: COLONOSCOPY WITH PROPOFOL;  Surgeon: Jonathon Bellows, MD;  [facility] ENDOSCOPY;  Service: Gastroenterology;  Laterality: N/A;  . KNEE ARTHROPLASTY Right 2009  . LOWER EXTREMITY ANGIOGRAPHY Left 07/21/2018   Procedure: LOWER EXTREMITY ANGIOGRAPHY;  Surgeon: Katha Cabal, MD;  Location: [facility];  Service: Cardiovascular;  Laterality: Left;    Social History   Tobacco Use  . Smoking status: Former Smoker    Quit date: 04/05/2013    Years since quitting: 6.4  . Smokeless tobacco: Never Used  Vaping Use  . Vaping Use: Never used  Substance Use Topics  . Alcohol use: Yes    Alcohol/week: 2.0 standard drinks    Types: 2 Standard drinks or equivalent per week    Comment: occasional  . Drug use: No     Medication list has been reviewed and updated.  Current Meds  Medication Sig  . aspirin 81 MG chewable tablet Chew 81 mg by mouth daily.   Marland Kitchen atorvastatin (LIPITOR) 40 MG tablet TAKE 1 TABLET DAILY (Patient taking differently: Take 40 mg by mouth daily. )  . clopidogrel (PLAVIX) 75 MG tablet TAKE 1 TABLET  DAILY (Patient taking differently: Take 75 mg by mouth daily. )  . Cyanocobalamin (VITAMIN B 12 PO) Take 1,000 mcg by mouth daily. 1000mg  daily   . fexofenadine (ALLEGRA) 180 MG tablet Take 180 mg by mouth daily.   Marland Kitchen lisinopril (ZESTRIL) 20 MG tablet TAKE 1 TABLET DAILY (REPLACES LISINOPRIL HCT) (Patient taking differently: Take 20 mg by mouth daily. )  . metFORMIN (GLUCOPHAGE) 500 MG tablet TAKE 1 TABLET TWICE A DAY (Patient taking differently: Take 500 mg by mouth 2 (two) times daily  with a meal. )  . Multiple Vitamin tablet Take 1 tablet by mouth daily.   . sertraline (ZOLOFT) 50 MG tablet TAKE 1 TABLET DAILY (Patient taking differently: Take 50 mg by mouth daily. )    PHQ 2/9 Scores 09/13/2019 12/23/2018 09/09/2018 04/28/2018  PHQ - 2 Score 0 0 1 0  PHQ- 9 Score 0 0 1 -    GAD 7 : Generalized Anxiety Score 09/13/2019  Nervous, Anxious, on Edge 0  Control/stop worrying 0  Worry too much - different things 0  Trouble relaxing 0  Restless 0  Easily annoyed or irritable 0  Afraid - awful might happen 0  Total GAD 7 Score 0  Anxiety Difficulty Not difficult at all    BP Readings from Last 3 Encounters:  09/13/19 134/78  09/07/19 134/65  09/02/19 105/66    Physical Exam Vitals and nursing note reviewed.  Constitutional:      General: She is not in acute distress.    Appearance: She is well-developed.  HENT:     Head: Normocephalic and atraumatic.     Right Ear: Tympanic membrane and ear canal normal.     Left Ear: Tympanic membrane and ear canal normal.     Nose:     Right Sinus: No maxillary sinus tenderness.     Left Sinus: No maxillary sinus tenderness.  Eyes:     General: No scleral icterus.       Right eye: No discharge.        Left eye: No discharge.     Conjunctiva/sclera: Conjunctivae normal.  Neck:     Thyroid: No thyromegaly.     Vascular: No carotid bruit.  Cardiovascular:     Rate and Rhythm: Normal rate and regular rhythm.     Pulses: Normal pulses.     Heart sounds: Normal heart sounds. No murmur heard.   Pulmonary:     Effort: Pulmonary effort is normal. No respiratory distress.     Breath sounds: No wheezing.  Chest:     Breasts:        Right: No mass, nipple discharge, skin change or tenderness.        Left: No mass, nipple discharge, skin change or tenderness.  Abdominal:     General: Bowel sounds are normal.     Palpations: Abdomen is soft.     Tenderness: There is no abdominal tenderness.  Musculoskeletal:     Cervical  back: Normal range of motion. No erythema.     Right lower leg: No edema.     Left lower leg: No edema.  Lymphadenopathy:     Cervical: No cervical adenopathy.  Skin:    General: Skin is warm and dry.     Capillary Refill: Capillary refill takes less than 2 seconds.     Findings: No rash.  Neurological:     General: No focal deficit present.     Mental Status: She is alert and oriented to  person, place, and time.     Cranial Nerves: No cranial nerve deficit.     Sensory: Sensation is intact. No sensory deficit.     Motor: Motor function is intact.     Gait: Gait normal.     Deep Tendon Reflexes: Reflexes are normal and symmetric.  Psychiatric:        Attention and Perception: Attention normal.        Mood and Affect: Mood normal.        Behavior: Behavior normal.        Thought Content: Thought content normal.     Wt Readings from Last 3 Encounters:  09/13/19 184 lb (83.5 kg)  09/07/19 184 lb 4.9 oz (83.6 kg)  09/02/19 178 lb 9.2 oz (81 kg)    BP 134/78   Pulse 86   Temp 98.5 F (36.9 C) (Oral)   Ht 5\' 2"  (1.575 m)   Wt 184 lb (83.5 kg)   LMP  (LMP Unknown)   SpO2 96%   BMI 33.65 kg/m   Assessment and Plan: 1. Annual physical exam Normal exam  Weight is up a few pounds - work on better diet - POCT urinalysis dipstick  2. Encounter for screening mammogram for breast cancer Schedule at [place]; Future  3. Essential (primary) hypertension Clinically stable exam with well controlled BP on lisinopril. Tolerating medications without side effects at this time. Pt to continue current regimen and low sodium diet; benefits of regular exercise as able discussed.  4. Hyperlipidemia associated with type 2 diabetes mellitus ([place]) Tolerating statin medication without side effects at this time LDL is at goal of < 70 on current dose Continue same therapy without change at this time. - Lipid panel  5. Type II diabetes mellitus with  complication (H[facility]) Clinically stable by exam and report without s/s of hypoglycemia. DM complicated by lipids and HTN. Tolerating medications - metformin -  well without side effects or other concerns. - Hemoglobin A1c  6. Stage 3b chronic kidney disease Followed by Nephrology No vitamin D level done  GFR appears slightly improved since last year Continue to avoid high phosphorus foods, nsaids  7. Hypogammaglobulinemia ([place]) Seen by Hematology  8. Major depressive disorder with single episode, in partial remission (HCC) Clinically stable on current regimen with good control of symptoms, No SI or HI. Will continue current therapy.  9. Osteopenia determined by x-ray Taking a multivitamin with vitamin D; will check levels - VITAMIN D 25 Hydroxy (Vit-D Deficiency, Fractures)   Partially dictated using Editor, commissioning. Any errors are unintentional.  Halina Maidens, MD [facility]  09/13/2019

## 2019-09-13 ENCOUNTER — Ambulatory Visit (INDEPENDENT_AMBULATORY_CARE_PROVIDER_SITE_OTHER): Payer: Medicare HMO | Admitting: Internal Medicine

## 2019-09-13 ENCOUNTER — Other Ambulatory Visit
Admission: RE | Admit: 2019-09-13 | Discharge: 2019-09-13 | Disposition: A | Discharge status: Home / Self Care | Payer: Medicare HMO | Attending: Internal Medicine | Admitting: Internal Medicine

## 2019-09-13 ENCOUNTER — Encounter: Payer: Self-pay | Admitting: Internal Medicine

## 2019-09-13 ENCOUNTER — Other Ambulatory Visit: Payer: Self-pay

## 2019-09-13 VITALS — BP 134/78 | HR 86 | Temp 98.5°F | Ht 62.0 in | Wt 184.0 lb

## 2019-09-13 DIAGNOSIS — E1169 Type 2 diabetes mellitus with other specified complication: Secondary | ICD-10-CM | POA: Insufficient documentation

## 2019-09-13 DIAGNOSIS — Z0000 Encounter for general adult medical examination without abnormal findings: Secondary | ICD-10-CM | POA: Diagnosis not present

## 2019-09-13 DIAGNOSIS — I10 Essential (primary) hypertension: Secondary | ICD-10-CM

## 2019-09-13 DIAGNOSIS — F324 Major depressive disorder, single episode, in partial remission: Secondary | ICD-10-CM

## 2019-09-13 DIAGNOSIS — Z1231 Encounter for screening mammogram for malignant neoplasm of breast: Secondary | ICD-10-CM | POA: Diagnosis not present

## 2019-09-13 DIAGNOSIS — E785 Hyperlipidemia, unspecified: Secondary | ICD-10-CM

## 2019-09-13 DIAGNOSIS — M8580 Other specified disorders of bone density and structure, unspecified site: Secondary | ICD-10-CM | POA: Insufficient documentation

## 2019-09-13 DIAGNOSIS — N1832 Chronic kidney disease, stage 3b: Secondary | ICD-10-CM

## 2019-09-13 DIAGNOSIS — D801 Nonfamilial hypogammaglobulinemia: Secondary | ICD-10-CM

## 2019-09-13 DIAGNOSIS — E118 Type 2 diabetes mellitus with unspecified complications: Secondary | ICD-10-CM

## 2019-09-13 LAB — LIPID PANEL
Cholesterol: 327 mg/dL — ABNORMAL HIGH (ref 0–200)
HDL: 57 mg/dL (ref >40)
LDL Cholesterol: UNDETERMINED mg/dL (ref 0–99)
Total CHOL/HDL Ratio: 5.7 RATIO
Triglycerides: 412 mg/dL — ABNORMAL HIGH (ref <150)
VLDL: UNDETERMINED mg/dL (ref 0–40)

## 2019-09-13 LAB — POCT URINALYSIS DIPSTICK
Bilirubin, UA: NEGATIVE
Blood, UA: NEGATIVE
Glucose, UA: NEGATIVE
Ketones, UA: NEGATIVE
Leukocytes, UA: NEGATIVE
Nitrite, UA: NEGATIVE
Protein, UA: NEGATIVE
Spec Grav, UA: >=1.03 — AB (ref 1.010–1.025)
Urobilinogen, UA: 0.2 E.U./dL
pH, UA: 5 (ref 5.0–8.0)

## 2019-09-13 LAB — VITAMIN D 25 HYDROXY (VIT D DEFICIENCY, FRACTURES): Vit D, 25-Hydroxy: 21.79 ng/mL — ABNORMAL LOW (ref 30–100)

## 2019-09-13 MED ORDER — MECLIZINE HCL 12.5 MG PO TABS: 12.5000 mg | ORAL_TABLET | Freq: Two times a day (BID) | ORAL | 2 refills | Status: DC | PRN

## 2019-09-14 LAB — HEMOGLOBIN A1C
Hgb A1c MFr Bld: 6.7 % — ABNORMAL HIGH (ref 4.8–5.6)
Mean Plasma Glucose: 146 mg/dL

## 2019-09-14 LAB — LDL CHOLESTEROL, DIRECT: Direct LDL: 181.1 mg/dL — ABNORMAL HIGH (ref 0–99)

## 2019-09-15 ENCOUNTER — Other Ambulatory Visit: Payer: Self-pay

## 2019-09-15 ENCOUNTER — Other Ambulatory Visit: Payer: Self-pay | Admitting: Internal Medicine

## 2019-09-15 MED ORDER — ROSUVASTATIN CALCIUM 5 MG PO TABS: 5.0000 mg | ORAL_TABLET | Freq: Every day | ORAL | 0 refills | Status: DC

## 2019-10-12 ENCOUNTER — Encounter: Payer: Self-pay | Admitting: Ophthalmology

## 2019-10-12 ENCOUNTER — Other Ambulatory Visit: Payer: Self-pay

## 2019-10-14 ENCOUNTER — Other Ambulatory Visit: Payer: Self-pay

## 2019-10-14 ENCOUNTER — Other Ambulatory Visit
Admission: RE | Admit: 2019-10-14 | Discharge: 2019-10-14 | Disposition: A | Discharge status: Home / Self Care | Payer: Medicare HMO | Source: Ambulatory Visit | Attending: Ophthalmology | Admitting: Ophthalmology

## 2019-10-14 DIAGNOSIS — Z20822 Contact with and (suspected) exposure to covid-19: Secondary | ICD-10-CM | POA: Diagnosis not present

## 2019-10-14 DIAGNOSIS — Z01812 Encounter for preprocedural laboratory examination: Secondary | ICD-10-CM | POA: Diagnosis present

## 2019-10-14 LAB — SARS CORONAVIRUS 2 (TAT 6-24 HRS): SARS Coronavirus 2: NEGATIVE

## 2019-10-14 NOTE — Discharge Instructions (Signed)

## 2019-10-17 NOTE — Anesthesia Preprocedure Evaluation (Addendum)
Anesthesia Evaluation  Patient identified by MRN, date of birth, ID band Patient awake    Reviewed: Allergy & Precautions, NPO status , Patient's Chart, lab work & pertinent test results  History of Anesthesia Complications Negative for: history of anesthetic complications  Airway Mallampati: IV   Neck ROM: Full    Dental  (+) Caps   Pulmonary former smoker (quit 2015),    Pulmonary exam normal breath sounds clear to auscultation       Cardiovascular hypertension, + Peripheral Vascular Disease (on Plavix)  Normal cardiovascular exam Rhythm:Regular Rate:Normal  Hx DVT   Neuro/Psych PSYCHIATRIC DISORDERS Depression negative neurological ROS     GI/Hepatic negative GI ROS,   Endo/Other  diabetesObesity   Renal/GU Renal disease (stage III CKD)     Musculoskeletal   Abdominal   Peds  Hematology  (+) Blood dyscrasia, anemia ,   Anesthesia Other Findings   Reproductive/Obstetrics                            Anesthesia Physical Anesthesia Plan  ASA: III  Anesthesia Plan: MAC   Post-op Pain Management:    Induction: Intravenous  PONV Risk Score and Plan: 2 and TIVA, Midazolam and Treatment may vary due to age or medical condition  Airway Management Planned: Nasal Cannula  Additional Equipment:   Intra-op Plan:   Post-operative Plan:   Informed Consent: I have reviewed the patients History and Physical, chart, labs and discussed the procedure including the risks, benefits and alternatives for the proposed anesthesia with the patient or authorized representative who has indicated his/her understanding and acceptance.       Plan Discussed with: CRNA  Anesthesia Plan Comments:        Anesthesia Quick Evaluation

## 2019-10-18 ENCOUNTER — Ambulatory Visit: Payer: Medicare HMO | Admitting: Anesthesiology

## 2019-10-18 ENCOUNTER — Other Ambulatory Visit: Payer: Self-pay

## 2019-10-18 ENCOUNTER — Other Ambulatory Visit: Payer: Self-pay | Admitting: Internal Medicine

## 2019-10-18 ENCOUNTER — Encounter: Payer: Self-pay | Admitting: Ophthalmology

## 2019-10-18 ENCOUNTER — Encounter
Admission: RE | Disposition: A | Discharge status: Home / Self Care | Payer: Self-pay | Source: Home / Self Care | Attending: Ophthalmology

## 2019-10-18 ENCOUNTER — Ambulatory Visit
Admission: RE | Admit: 2019-10-18 | Discharge: 2019-10-18 | Disposition: A | Discharge status: Home / Self Care | Payer: Medicare HMO | Attending: Ophthalmology | Admitting: Ophthalmology

## 2019-10-18 DIAGNOSIS — Z87891 Personal history of nicotine dependence: Secondary | ICD-10-CM | POA: Diagnosis not present

## 2019-10-18 DIAGNOSIS — Z7984 Long term (current) use of oral hypoglycemic drugs: Secondary | ICD-10-CM | POA: Insufficient documentation

## 2019-10-18 DIAGNOSIS — H2512 Age-related nuclear cataract, left eye: Secondary | ICD-10-CM | POA: Insufficient documentation

## 2019-10-18 DIAGNOSIS — E785 Hyperlipidemia, unspecified: Secondary | ICD-10-CM | POA: Insufficient documentation

## 2019-10-18 DIAGNOSIS — Z7902 Long term (current) use of antithrombotics/antiplatelets: Secondary | ICD-10-CM | POA: Insufficient documentation

## 2019-10-18 DIAGNOSIS — Z7982 Long term (current) use of aspirin: Secondary | ICD-10-CM | POA: Diagnosis not present

## 2019-10-18 DIAGNOSIS — N183 Chronic kidney disease, stage 3 unspecified: Secondary | ICD-10-CM | POA: Diagnosis not present

## 2019-10-18 DIAGNOSIS — Z882 Allergy status to sulfonamides status: Secondary | ICD-10-CM | POA: Diagnosis not present

## 2019-10-18 DIAGNOSIS — Z79899 Other long term (current) drug therapy: Secondary | ICD-10-CM | POA: Insufficient documentation

## 2019-10-18 DIAGNOSIS — E1122 Type 2 diabetes mellitus with diabetic chronic kidney disease: Secondary | ICD-10-CM | POA: Insufficient documentation

## 2019-10-18 DIAGNOSIS — I739 Peripheral vascular disease, unspecified: Secondary | ICD-10-CM | POA: Insufficient documentation

## 2019-10-18 DIAGNOSIS — Z881 Allergy status to other antibiotic agents status: Secondary | ICD-10-CM | POA: Diagnosis not present

## 2019-10-18 DIAGNOSIS — F329 Major depressive disorder, single episode, unspecified: Secondary | ICD-10-CM | POA: Diagnosis not present

## 2019-10-18 DIAGNOSIS — Z95828 Presence of other vascular implants and grafts: Secondary | ICD-10-CM | POA: Diagnosis not present

## 2019-10-18 DIAGNOSIS — I129 Hypertensive chronic kidney disease with stage 1 through stage 4 chronic kidney disease, or unspecified chronic kidney disease: Secondary | ICD-10-CM | POA: Insufficient documentation

## 2019-10-18 DIAGNOSIS — F32A Depression, unspecified: Secondary | ICD-10-CM

## 2019-10-18 DIAGNOSIS — Z86718 Personal history of other venous thrombosis and embolism: Secondary | ICD-10-CM | POA: Insufficient documentation

## 2019-10-18 HISTORY — PX: CATARACT EXTRACTION W/PHACO: SHX586

## 2019-10-18 LAB — GLUCOSE, CAPILLARY
Glucose-Capillary: 126 mg/dL — ABNORMAL HIGH (ref 70–99)
Glucose-Capillary: 119 mg/dL — ABNORMAL HIGH (ref 70–99)

## 2019-10-18 SURGERY — PHACOEMULSIFICATION, CATARACT, WITH IOL INSERTION
Anesthesia: Monitor Anesthesia Care | Site: Eye | Laterality: Left

## 2019-10-18 MED ORDER — ONDANSETRON HCL 4 MG/2ML IJ SOLN: 4.0000 mg | Freq: Once | INTRAMUSCULAR | Status: DC | PRN

## 2019-10-18 MED ORDER — ACETAMINOPHEN 325 MG PO TABS: 650.0000 mg | ORAL_TABLET | Freq: Once | ORAL | Status: DC | PRN

## 2019-10-18 MED ORDER — MIDAZOLAM HCL 2 MG/2ML IJ SOLN
INTRAMUSCULAR | Status: DC | PRN
  Administered 2019-10-18: 1 mg via INTRAVENOUS

## 2019-10-18 MED ORDER — SERTRALINE HCL 50 MG PO TABS: 50.0000 mg | ORAL_TABLET | Freq: Every day | ORAL | 3 refills | Status: DC

## 2019-10-18 MED ORDER — EPINEPHRINE PF 1 MG/ML IJ SOLN
INTRAOCULAR | Status: DC | PRN
  Administered 2019-10-18: 92 mL via OPHTHALMIC

## 2019-10-18 MED ORDER — SODIUM HYALURONATE 23 MG/ML IO SOLN
INTRAOCULAR | Status: DC | PRN
  Administered 2019-10-18: 0.6 mL via INTRAOCULAR

## 2019-10-18 MED ORDER — LIDOCAINE HCL (PF) 2 % IJ SOLN
INTRAOCULAR | Status: DC | PRN
  Administered 2019-10-18: 1 mL via INTRAOCULAR

## 2019-10-18 MED ORDER — ACETAMINOPHEN 160 MG/5ML PO SOLN: 325.0000 mg | ORAL | Status: DC | PRN

## 2019-10-18 MED ORDER — SODIUM HYALURONATE 10 MG/ML IO SOLN
INTRAOCULAR | Status: DC | PRN
  Administered 2019-10-18: 0.55 mL via INTRAOCULAR

## 2019-10-18 MED ORDER — LACTATED RINGERS IV SOLN: INTRAVENOUS | Status: DC

## 2019-10-18 MED ORDER — ARMC OPHTHALMIC DILATING DROPS
1.0000 "application " | OPHTHALMIC | Status: DC | PRN
  Administered 2019-10-18 (×3): 1 via OPHTHALMIC

## 2019-10-18 MED ORDER — FENTANYL CITRATE (PF) 100 MCG/2ML IJ SOLN
INTRAMUSCULAR | Status: DC | PRN
Start: 2019-10-18 — End: 2019-10-18
  Administered 2019-10-18: 50 ug via INTRAVENOUS

## 2019-10-18 MED ORDER — MOXIFLOXACIN HCL 0.5 % OP SOLN
OPHTHALMIC | Status: DC | PRN
  Administered 2019-10-18: 0.2 mL via OPHTHALMIC

## 2019-10-18 MED ORDER — TETRACAINE HCL 0.5 % OP SOLN
1.0000 [drp] | OPHTHALMIC | Status: DC | PRN
  Administered 2019-10-18 (×3): 1 [drp] via OPHTHALMIC

## 2019-10-18 SURGICAL SUPPLY — 18 items
CANNULA ANT/CHMB 27GA (MISCELLANEOUS) ×4 IMPLANT
DISSECTOR HYDRO NUCLEUS 50X22 (MISCELLANEOUS) ×2 IMPLANT
GLOVE SURG LX 7.5 STRW (GLOVE) ×1
GLOVE SURG LX STRL 7.5 STRW (GLOVE) ×1 IMPLANT
GLOVE SURG SYN 8.5  E (GLOVE) ×1
GLOVE SURG SYN 8.5 E (GLOVE) ×1 IMPLANT
GOWN STRL REUS W/ TWL LRG LVL3 (GOWN DISPOSABLE) ×2 IMPLANT
GOWN STRL REUS W/TWL LRG LVL3 (GOWN DISPOSABLE) ×4
LENS IOL DIOP 22.5 (Intraocular Lens) ×2 IMPLANT
LENS IOL TECNIS MONO 22.5 (Intraocular Lens) ×1 IMPLANT
MARKER SKIN DUAL TIP RULER LAB (MISCELLANEOUS) ×2 IMPLANT
PACK DR. KING ARMS (PACKS) ×2 IMPLANT
PACK EYE AFTER SURG (MISCELLANEOUS) ×2 IMPLANT
PACK OPTHALMIC (MISCELLANEOUS) ×2 IMPLANT
SYR 3ML LL SCALE MARK (SYRINGE) ×2 IMPLANT
SYR TB 1ML LUER SLIP (SYRINGE) ×2 IMPLANT
WATER STERILE IRR 250ML POUR (IV SOLUTION) ×2 IMPLANT
WIPE NON LINTING 3.25X3.25 (MISCELLANEOUS) ×2 IMPLANT

## 2019-10-18 NOTE — Telephone Encounter (Signed)
Requested Prescriptions  Pending Prescriptions Disp Refills  . sertraline (ZOLOFT) 50 MG tablet 90 tablet 1    Sig: Take 1 tablet (50 mg total) by mouth daily.     Psychiatry:  Antidepressants - SSRI Passed - 10/18/2019  2:17 PM      Passed - Completed PHQ-2 or PHQ-9 in the last 360 days.      Passed - Valid encounter within last 6 months    Recent Outpatient Visits          1 month ago Annual physical exam   [facility] Glean Hess, MD   9 months ago Type II diabetes mellitus with complication [facility])   [facility] Glean Hess, MD   1 year ago Annual physical exam   [facility] Glean Hess, MD   1 year ago Nocturnal leg cramps   [facility] Glean Hess, MD   1 year ago Encounter for screening mammogram for breast cancer   [facility] Glean Hess, MD

## 2019-10-18 NOTE — Anesthesia Postprocedure Evaluation (Signed)
Anesthesia Post Note  Patient: Misty Villarreal  Procedure(s) Performed: CATARACT EXTRACTION PHACO AND INTRAOCULAR LENS PLACEMENT (IOC) LEFT DIABETIC 2.55  00:28.5 (Left Eye)     Patient location during evaluation: PACU Anesthesia Type: MAC Level of consciousness: awake and alert, oriented and patient cooperative Pain management: pain level controlled Vital Signs Assessment: post-procedure vital signs reviewed and stable Respiratory status: spontaneous breathing, nonlabored ventilation and respiratory function stable Cardiovascular status: blood pressure returned to baseline and stable Postop Assessment: adequate PO intake Anesthetic complications: no   No complications documented.  Darrin Nipper

## 2019-10-18 NOTE — Op Note (Signed)
OPERATIVE NOTE  Misty Villarreal [id number] 10/18/2019   PREOPERATIVE DIAGNOSIS:  Nuclear sclerotic cataract left eye.  H25.12   POSTOPERATIVE DIAGNOSIS:    Nuclear sclerotic cataract left eye.     PROCEDURE:  Phacoemusification with posterior chamber intraocular lens placement of the left eye   LENS:   Implant Name Type Inv. Item Serial No. Manufacturer Lot No. LRB No. Used Action  LENS IOL DIOP 22.5 - [id number] Intraocular Lens LENS IOL DIOP 22.5 [id number]   Left 1 Implanted      Procedure(s) with comments: CATARACT EXTRACTION PHACO AND INTRAOCULAR LENS PLACEMENT (IOC) LEFT DIABETIC 2.55  00:28.5 (Left) - Diabetic - oral meds  DCB00 +22.5   ULTRASOUND TIME: 0 minutes 28 seconds.  CDE 2.55   SURGEON:  Benay Pillow, MD, MPH   ANESTHESIA:  Topical with tetracaine drops augmented with 1% preservative-free intracameral lidocaine.  ESTIMATED BLOOD LOSS: <1 mL   COMPLICATIONS:  None.   DESCRIPTION OF PROCEDURE:  The patient was identified in the holding room and transported to the operating room and placed in the supine position under the operating microscope.  The left eye was identified as the operative eye and it was prepped and draped in the usual sterile ophthalmic fashion.   A 1.0 millimeter clear-corneal paracentesis was made at the 5:00 position. 0.5 ml of preservative-free 1% lidocaine with epinephrine was injected into the anterior chamber.  The anterior chamber was filled with Healon 5 viscoelastic.  A 2.4 millimeter keratome was used to make a near-clear corneal incision at the 2:00 position.  A curvilinear capsulorrhexis was made with a cystotome and capsulorrhexis forceps.  Balanced salt solution was used to hydrodissect and hydrodelineate the nucleus.   Phacoemulsification was then used in stop and chop fashion to remove the lens nucleus and epinucleus.  The remaining cortex was then removed using the irrigation and aspiration handpiece. Healon was then  placed into the capsular bag to distend it for lens placement.  A lens was then injected into the capsular bag.  The remaining viscoelastic was aspirated.   Wounds were hydrated with balanced salt solution.  The anterior chamber was inflated to a physiologic pressure with balanced salt solution.  Intracameral vigamox 0.1 mL undiltued was injected into the eye and a drop placed onto the ocular surface.  No wound leaks were noted.  The patient was taken to the recovery room in stable condition without complications of anesthesia or surgery  Benay Pillow 10/18/2019, 11:29 AM

## 2019-10-18 NOTE — Telephone Encounter (Signed)
Medication Refill - Medication: sertraline (ZOLOFT) 50 MG tablet   Has the patient contacted their pharmacy? Yes.   (Agent: If no, request that the patient contact the pharmacy for the refill.) (Agent: If yes, when and what did the pharmacy advise?)  Preferred Pharmacy (with phone number or street name):  [place], [place]  [address] [place] [address]  Phone: [phone] Fax: [phone]    Agent: Please be advised that RX refills may take up to 3 business days. We ask that you follow-up with your pharmacy.

## 2019-10-18 NOTE — Transfer of Care (Signed)
Immediate Anesthesia Transfer of Care Note  Patient: Misty Villarreal  Procedure(s) Performed: CATARACT EXTRACTION PHACO AND INTRAOCULAR LENS PLACEMENT (IOC) LEFT DIABETIC 2.55  00:28.5 (Left Eye)  Patient Location: PACU  Anesthesia Type: MAC  Level of Consciousness: awake, alert  and patient cooperative  Airway and Oxygen Therapy: Patient Spontanous Breathing and Patient connected to supplemental oxygen  Post-op Assessment: Post-op Vital signs reviewed, Patient's Cardiovascular Status Stable, Respiratory Function Stable, Patent Airway and No signs of Nausea or vomiting  Post-op Vital Signs: Reviewed and stable  Complications: No complications documented.

## 2019-10-18 NOTE — H&P (Signed)

## 2019-10-18 NOTE — Anesthesia Procedure Notes (Signed)
Procedure Name: MAC Date/Time: 10/18/2019 11:04 AM Performed by: Cameron Ali, CRNA Pre-anesthesia Checklist: Patient identified, Emergency Drugs available, Suction available, Timeout performed and Patient being monitored Patient Re-evaluated:Patient Re-evaluated prior to induction Oxygen Delivery Method: Nasal cannula Placement Confirmation: positive ETCO2

## 2019-10-19 ENCOUNTER — Encounter: Payer: Self-pay | Admitting: Ophthalmology

## 2019-10-20 ENCOUNTER — Telehealth: Payer: Self-pay

## 2019-10-20 ENCOUNTER — Ambulatory Visit (INDEPENDENT_AMBULATORY_CARE_PROVIDER_SITE_OTHER): Payer: Medicare HMO

## 2019-10-20 DIAGNOSIS — Z87891 Personal history of nicotine dependence: Secondary | ICD-10-CM

## 2019-10-20 DIAGNOSIS — Z0000 Encounter for general adult medical examination without abnormal findings: Secondary | ICD-10-CM | POA: Diagnosis not present

## 2019-10-20 DIAGNOSIS — Z122 Encounter for screening for malignant neoplasm of respiratory organs: Secondary | ICD-10-CM

## 2019-10-20 NOTE — Progress Notes (Signed)
Subjective:   Misty Villarreal is a 69 y.o. female who presents for Medicare Annual (Subsequent) preventive examination.  Virtual Visit via Telephone Note  I connected with  Misty Villarreal on 10/20/19 at 11:20 AM EDT by telephone and verified that I am speaking with the correct person using two identifiers.  Medicare Annual Wellness visit completed telephonically due to Covid-19 pandemic.   Location: Patient: home Provider: [facility]   I discussed the limitations, risks, security and privacy concerns of performing an evaluation and management service by telephone and the availability of in person appointments. The patient expressed understanding and agreed to proceed.  Unable to perform video visit due to video visit attempted and failed and/or patient does not have video capability.   Some vital signs may be absent or patient reported.   Clemetine Marker, LPN     Review of Systems     Cardiac Risk Factors include: advanced age (>37men, >91 women);diabetes mellitus;dyslipidemia;hypertension;obesity (BMI >30kg/m2)     Objective:    There were no vitals filed for this visit. There is no height or weight on file to calculate BMI.  Advanced Directives 10/20/2019 10/18/2019 09/07/2019 03/08/2019 12/15/2018 11/25/2018 11/11/2018  Does Patient Have a Medical Advance Directive? No No No Yes Yes Yes Yes  Type of Advance Directive - - - Press photographer Living will;Healthcare Power of [place];Living will [place];Living will  Does patient want to make changes to medical advance directive? - - No - Patient declined - No - Patient declined - -  Copy of Branch in Chart? - - No - copy requested No - copy requested No - copy requested No - copy requested No - copy requested  Would patient like information on creating a medical advance directive? Yes (MAU/Ambulatory/Procedural Areas - Information given) Yes  (MAU/Ambulatory/Procedural Areas - Information given) No - Patient declined - No - Patient declined - -    Current Medications (verified) Outpatient Encounter Medications as of 10/20/2019  Medication Sig  . aspirin 81 MG chewable tablet Chew 81 mg by mouth daily.   Marland Kitchen atorvastatin (LIPITOR) 40 MG tablet TAKE 1 TABLET DAILY (Patient taking differently: Take 40 mg by mouth daily. )  . Cholecalciferol (VITAMIN D) 50 MCG (2000 UT) CAPS Take 2 capsules by mouth daily.  . clopidogrel (PLAVIX) 75 MG tablet TAKE 1 TABLET DAILY (Patient taking differently: Take 75 mg by mouth daily. )  . Cyanocobalamin (VITAMIN B 12 PO) Take 1,000 mcg by mouth daily. 1000mg  daily   . fexofenadine (ALLEGRA) 180 MG tablet Take 180 mg by mouth daily.   Marland Kitchen lisinopril (ZESTRIL) 20 MG tablet TAKE 1 TABLET DAILY (REPLACES LISINOPRIL HCT) (Patient taking differently: Take 20 mg by mouth daily. )  . meclizine (ANTIVERT) 12.5 MG tablet Take 1 tablet (12.5 mg total) by mouth 2 (two) times daily as needed for dizziness.  . metFORMIN (GLUCOPHAGE) 500 MG tablet TAKE 1 TABLET TWICE A DAY (Patient taking differently: Take 500 mg by mouth 2 (two) times daily with a meal. )  . Multiple Vitamin tablet Take 1 tablet by mouth daily.   . sertraline (ZOLOFT) 50 MG tablet Take 1 tablet (50 mg total) by mouth daily.  . [DISCONTINUED] rosuvastatin (CRESTOR) 5 MG tablet Take 1 tablet (5 mg total) by mouth daily.   No facility-administered encounter medications on file as of 10/20/2019.    Allergies (verified) Augmentin [amoxicillin-pot clavulanate], Rosuvastatin, Tetracycline, Cefaclor, Cephalexin, and Sulfa antibiotics  History: Past Medical History:  Diagnosis Date  . Abnormal glandular Papanicolaou smear of cervix 09/01/2014   Normal Pap 2012   . Anemia   . Chronic kidney disease    stage 3  . Depression   . Diabetes mellitus without complication ([place])   . DVT (deep venous thrombosis) ([place])   . Hyperlipidemia   . Hypertension   .  Peripheral vascular disease ([place])   . Peripheral vascular disease ([place])   . Slurred speech 12/03/2016   Past Surgical History:  Procedure Laterality Date  . ANGIOPLASTY / STENTING FEMORAL Left 2014, 2013  . BRAIN MENINGIOMA EXCISION  1988  . BREAST BIOPSY Right 1989   benign  . CARPAL TUNNEL RELEASE Left 1980  . CATARACT EXTRACTION W/PHACO Right 09/02/2019   Procedure: CATARACT EXTRACTION PHACO AND INTRAOCULAR LENS PLACEMENT (IOC);  Surgeon: Eulogio Bear, MD;  Location: [facility] ORS;  Service: Ophthalmology;  Laterality: Right;  [place] 00:27.3 CDE 1.92 AP% Fluid Pack lot # [id number]  . CATARACT EXTRACTION W/PHACO Left 10/18/2019   Procedure: CATARACT EXTRACTION PHACO AND INTRAOCULAR LENS PLACEMENT (IOC) LEFT DIABETIC 2.55  00:28.5;  Surgeon: Eulogio Bear, MD;  Location: [facility];  Service: Ophthalmology;  Laterality: Left;  Diabetic - oral meds  . CESAREAN SECTION    . CHOLECYSTECTOMY  1987  . COLONOSCOPY WITH PROPOFOL N/A 05/08/2017   Procedure: COLONOSCOPY WITH PROPOFOL;  Surgeon: Jonathon Bellows, MD;  Location: [facility] ENDOSCOPY;  Service: Gastroenterology;  Laterality: N/A;  . KNEE ARTHROPLASTY Right 2009  . LOWER EXTREMITY ANGIOGRAPHY Left 07/21/2018   Procedure: LOWER EXTREMITY ANGIOGRAPHY;  Surgeon: Katha Cabal, MD;  Location: [facility];  Service: Cardiovascular;  Laterality: Left;   Family History  Problem Relation Age of Onset  . Diabetes Mother   . Heart failure Father   . Diabetes Father   . Breast cancer Neg Hx    Social History   Socioeconomic History  . Marital status: Widowed    Spouse name: Not on file  . Number of children: 2  . Years of education: Not on file  . Highest education level: Not on file  Occupational History  . Not on file  Tobacco Use  . Smoking status: Former Smoker    Quit date: 04/05/2013    Years since quitting: 6.5  . Smokeless tobacco: Never Used  Vaping Use  . Vaping Use: Never used  Substance and Sexual  Activity  . Alcohol use: Yes    Alcohol/week: 2.0 standard drinks    Types: 2 Standard drinks or equivalent per week    Comment: occasional  . Drug use: No  . Sexual activity: Not on file  Other Topics Concern  . Not on file  Social History Narrative  . Not on file   Social Determinants of Health   Financial Resource Strain: Low Risk   . Difficulty of Paying Living Expenses: Not hard at all  Food Insecurity: No Food Insecurity  . Worried About Charity fundraiser in the Last Year: Never true  . Ran Out of Food in the Last Year: Never true  Transportation Needs: No Transportation Needs  . Lack of Transportation (Medical): No  . Lack of Transportation (Non-Medical): No  Physical Activity: Inactive  . Days of Exercise per Week: 0 days  . Minutes of Exercise per Session: 0 min  Stress: No Stress Concern Present  . Feeling of Stress : Not at all  Social Connections: Socially Isolated  . Frequency of Communication  with Friends and Family: More than three times a week  . Frequency of Social Gatherings with Friends and Family: More than three times a week  . Attends Religious Services: Never  . Active Member of Clubs or Organizations: No  . Attends Archivist Meetings: Never  . Marital Status: Widowed    Tobacco Counseling Counseling given: Not Answered   Clinical Intake:  Pre-visit preparation completed: Yes  Pain : No/denies pain     Nutritional Risks: Nausea/ vomitting/ diarrhea (diarrhea) Diabetes: Yes CBG done?: No Did pt. bring in CBG monitor from home?: No  How often do you need to have someone help you when you read instructions, pamphlets, or other written materials from your doctor or pharmacy?: 1 - Never  Nutrition Risk Assessment:  Has the patient had any N/V/D within the last 2 months?  Yes  Does the patient have any non-healing wounds?  No  Has the patient had any unintentional weight loss or weight gain?  No   Diabetes:  Is the patient  diabetic?  Yes  If diabetic, was a CBG obtained today?  No  Did the patient bring in their glucometer from home?  No  How often do you monitor your CBG's? Twice weekly.   Financial Strains and Diabetes Management:  Are you having any financial strains with the device, your supplies or your medication? No .  Does the patient want to be seen by Chronic Care Management for management of their diabetes?  No  Would the patient like to be referred to a Nutritionist or for Diabetic Management?  No   Diabetic Exams:  Diabetic Eye Exam: Completed 07/13/19 negative retinopathy.   Diabetic Foot Exam: Completed 09/13/19.   Interpreter Needed?: No  Information entered by :: Clemetine Marker LPN   Activities of Daily Living In your present state of health, do you have any difficulty performing the following activities: 10/20/2019 10/18/2019  Hearing? N N  Comment declines hearing aids -  Vision? N N  Difficulty concentrating or making decisions? N N  Walking or climbing stairs? N N  Dressing or bathing? N N  Doing errands, shopping? N -  Preparing Food and eating ? N -  Using the Toilet? N -  In the past six months, have you accidently leaked urine? Y -  Comment wears pads for protection -  Do you have problems with loss of bowel control? Y -  Managing your Medications? N -  Managing your Finances? N -  Housekeeping or managing your Housekeeping? N -  Some recent data might be hidden    Patient Care Team: Glean Hess, MD as PCP - General (Internal Medicine) Delana Meyer, Dolores Lory, MD (Vascular Surgery) Anthonette Legato, MD (Nephrology)  Indicate any recent Medical Services you may have received from other than [facility] providers in the past year (date may be approximate).     Assessment:   This is a routine wellness examination for [place].  Hearing/Vision screen  Hearing Screening   125Hz  250Hz  500Hz  1000Hz  2000Hz  3000Hz  4000Hz  6000Hz  8000Hz   Right ear:           Left ear:             Comments: Pt denies hearing difficulty  Vision Screening Comments: Annual vision screenings done by [facility]  Dietary issues and exercise activities discussed: Current Exercise Habits: The patient does not participate in regular exercise at present, Exercise limited by: None identified  Goals    . Increase  physical activity     Pt would like to increase physical activity to at least 3 days per week      Depression Screen PHQ 2/9 Scores 10/20/2019 09/13/2019 12/23/2018 09/09/2018 04/28/2018 01/28/2018 01/28/2018  PHQ - 2 Score 0 0 0 1 0 0 0  PHQ- 9 Score - 0 0 1 - 0 -    Fall Risk Fall Risk  10/20/2019 09/13/2019 04/28/2018 01/28/2018 08/04/2017  Falls in the past year? 1 1 0 1 Yes  Number falls in past yr: 1 1 0 1 2 or more  Injury with Fall? 0 0 0 0 Yes  Comment - - - - sprain right wrist  Risk Factor Category  - - - - High Fall Risk  Risk for fall due to : History of fall(s) History of fall(s);Impaired balance/gait - History of fall(s);Impaired balance/gait -  Follow up Falls prevention discussed Falls evaluation completed Falls evaluation completed Falls evaluation completed;Education provided Falls evaluation completed    Any stairs in or around the home? Yes  If so, are there any without handrails? No  Home free of loose throw rugs in walkways, pet beds, electrical cords, etc? Yes  Adequate lighting in your home to reduce risk of falls? Yes   ASSISTIVE DEVICES UTILIZED TO PREVENT FALLS:  Life alert? No  Use of a cane, walker or w/c? No  Grab bars in the bathroom? Yes  Shower chair or bench in shower? Yes  Elevated toilet seat or a handicapped toilet? Yes   TIMED UP AND GO:  Was the test performed? No . Telephonic visit.   Cognitive Function:     6CIT Screen 10/20/2019 08/04/2017 08/05/2016  What Year? 0 points 0 points 0 points  What month? 0 points 0 points 0 points  What time? 0 points 0 points 0 points  Count back from 20 0 points 0 points 0 points  Months in  reverse 0 points 0 points 0 points  Repeat phrase 0 points 0 points 0 points  Total Score 0 0 0    Immunizations Immunization History  Administered Date(s) Administered  . Influenza, High Dose Seasonal PF 11/13/2016, 11/12/2018  . Influenza-Unspecified 11/21/2016, 12/18/2017  . PFIZER SARS-COV-2 Vaccination 03/24/2019, 04/14/2019  . Pneumococcal Conjugate-13 08/04/2015  . Pneumococcal Polysaccharide-23 12/15/2012, 09/09/2018  . Zoster Recombinat (Shingrix) 12/04/2017, 11/12/2018    TDAP status: Due, Education has been provided regarding the importance of this vaccine. Advised may receive this vaccine at local pharmacy or Health Dept. Aware to provide a copy of the vaccination record if obtained from local pharmacy or Health Dept. Verbalized acceptance and understanding.   Flu Vaccine status: Declined, Education has been provided regarding the importance of this vaccine but patient still declined. Advised may receive this vaccine at local pharmacy or Health Dept. Aware to provide a copy of the vaccination record if obtained from local pharmacy or Health Dept. Verbalized acceptance and understanding.   Pneumococcal vaccine status: Up to date   Covid-19 vaccine status: Completed vaccines  Qualifies for Shingles Vaccine? Yes   Zostavax completed Yes   Shingrix Completed?: Yes  Screening Tests Health Maintenance  Topic Date Due  . TETANUS/TDAP  Never done  . MAMMOGRAM  02/17/2019  . INFLUENZA VACCINE  08/22/2019  . HEMOGLOBIN A1C  03/15/2020  . OPHTHALMOLOGY EXAM  07/12/2020  . FOOT EXAM  09/12/2020  . COLONOSCOPY  05/09/2027  . DEXA SCAN  Completed  . COVID-19 Vaccine  Completed  . PNA vac Low Risk Adult  Completed  .  Hepatitis C Screening  Addressed    Health Maintenance  Health Maintenance Due  Topic Date Due  . TETANUS/TDAP  Never done  . MAMMOGRAM  02/17/2019  . INFLUENZA VACCINE  08/22/2019     Colorectal cancer screening: Completed 05/08/17. Repeat every 10 years    Mammogram status: Completed 02/16/18. Repeat every year Ordered 09/13/19  Bone Density status: Completed 02/16/18. Results reflect: Bone density results: OSTEOPENIA. Repeat every 2 years.  Lung Cancer Screening: (Low Dose CT Chest recommended if Age 73-80 years, 30 pack-year currently smoking OR have quit w/in 15years.) does qualify.   Lung Cancer Screening Referral: An Epic message has been sent to Burgess Estelle, RN (Oncology Nurse Navigator) regarding the possible need for this exam. Raquel Sarna will review the patient's chart to determine if the patient truly qualifies for the exam. If the patient qualifies, Raquel Sarna will order the Low Dose CT of the chest to facilitate the scheduling of this exam.  Additional Screening:  Hepatitis C Screening: does qualify; Completed 05/22/14  Vision Screening: Recommended annual ophthalmology exams for early detection of glaucoma and other disorders of the eye. Is the patient up to date with their annual eye exam?  Yes  Who is the provider or what is the name of the office in which the patient attends annual eye exams? [place] Screening: Recommended annual dental exams for proper oral hygiene  Community Resource Referral / Chronic Care Management: CRR required this visit?  No   CCM required this visit?  No      Plan:     I have personally reviewed and noted the following in the patient's chart:   . Medical and social history . Use of alcohol, tobacco or illicit drugs  . Current medications and supplements . Functional ability and status . Nutritional status . Physical activity . Advanced directives . List of other physicians . Hospitalizations, surgeries, and ER visits in previous 12 months . Vitals . Screenings to include cognitive, depression, and falls . Referrals and appointments  In addition, I have reviewed and discussed with patient certain preventive protocols, quality metrics, and best practice recommendations. A  written personalized care plan for preventive services as well as general preventive health recommendations were provided to patient.     Clemetine Marker, LPN   10/06/3844   Nurse Notes: pt doing well s/p cataract surgery on 10/18/19. She restarted atorvastatin due to increased diarrhea with rosuvastatin.

## 2019-10-20 NOTE — Telephone Encounter (Signed)
Contacted patient for lung CT screening clinic after receiving referral from Dr. Carolin Coy.  I spoke to patient about our program and she is agreeable to participate. Patient is a former smoker.  She started smoking at age 69 and smoked a pack and 1/2 a day.  She was not home to look at her calendar and asked me to pick the next available CT scan date.  I have scheduled her for Tuesday, Oct 26 at 1:15 but she knows she can call to changed that appt if that does not work for her.  She asked [place] to text her the appt time and date and address to imaging center.

## 2019-10-20 NOTE — Patient Instructions (Signed)
Misty Villarreal , Thank you for taking time to come for your Medicare Wellness Visit. I appreciate your ongoing commitment to your health goals. Please review the following plan we discussed and let me know if I can assist you in the future.   Screening recommendations/referrals: Colonoscopy: done 05/08/17.  Mammogram: done 02/16/18. Please call [phone] to schedule your mammogram.  Bone Density: done 02/16/18 Recommended yearly ophthalmology/optometry visit for glaucoma screening and checkup Recommended yearly dental visit for hygiene and checkup  Vaccinations: Influenza vaccine: due Pneumococcal vaccine: done 09/09/18 Tdap vaccine: due Shingles vaccine: done 12/04/17 & 11/12/18   Covid-19: done 03/24/19 & 04/14/19  Advanced directives: Advance directive discussed with you today. I have provided a copy for you to complete at home and have notarized. Once this is complete please bring a copy in to our office so we can scan it into your chart.  Conditions/risks identified: Recommend increasing physical activity to at least 3 days per week  Next appointment: Follow up in one year for your annual wellness visit    Preventive Care 65 Years and Older, Female Preventive care refers to lifestyle choices and visits with your health care provider that can promote health and wellness. What does preventive care include?  A yearly physical exam. This is also called an annual well check.  Dental exams once or twice a year.  Routine eye exams. Ask your health care provider how often you should have your eyes checked.  Personal lifestyle choices, including:  Daily care of your teeth and gums.  Regular physical activity.  Eating a healthy diet.  Avoiding tobacco and drug use.  Limiting alcohol use.  Practicing safe sex.  Taking low-dose aspirin every day.  Taking vitamin and mineral supplements as recommended by your health care provider. What happens during an annual well check? The  services and screenings done by your health care provider during your annual well check will depend on your age, overall health, lifestyle risk factors, and family history of disease. Counseling  Your health care provider may ask you questions about your:  Alcohol use.  Tobacco use.  Drug use.  Emotional well-being.  Home and relationship well-being.  Sexual activity.  Eating habits.  History of falls.  Memory and ability to understand (cognition).  Work and work Statistician.  Reproductive health. Screening  You may have the following tests or measurements:  Height, weight, and BMI.  Blood pressure.  Lipid and cholesterol levels. These may be checked every 5 years, or more frequently if you are over 81 years old.  Skin check.  Lung cancer screening. You may have this screening every year starting at age 26 if you have a 30-pack-year history of smoking and currently smoke or have quit within the past 15 years.  Fecal occult blood test (FOBT) of the stool. You may have this test every year starting at age 86.  Flexible sigmoidoscopy or colonoscopy. You may have a sigmoidoscopy every 5 years or a colonoscopy every 10 years starting at age 72.  Hepatitis C blood test.  Hepatitis B blood test.  Sexually transmitted disease (STD) testing.  Diabetes screening. This is done by checking your blood sugar (glucose) after you have not eaten for a while (fasting). You may have this done every 1-3 years.  Bone density scan. This is done to screen for osteoporosis. You may have this done starting at age 56.  Mammogram. This may be done every 1-2 years. Talk to your health care provider about how often  you should have regular mammograms. Talk with your health care provider about your test results, treatment options, and if necessary, the need for more tests. Vaccines  Your health care provider may recommend certain vaccines, such as:  Influenza vaccine. This is recommended  every year.  Tetanus, diphtheria, and acellular pertussis (Tdap, Td) vaccine. You may need a Td booster every 10 years.  Zoster vaccine. You may need this after age 68.  Pneumococcal 13-valent conjugate (PCV13) vaccine. One dose is recommended after age 105.  Pneumococcal polysaccharide (PPSV23) vaccine. One dose is recommended after age 83. Talk to your health care provider about which screenings and vaccines you need and how often you need them. This information is not intended to replace advice given to you by your health care provider. Make sure you discuss any questions you have with your health care provider. Document Released: 02/03/2015 Document Revised: 09/27/2015 Document Reviewed: 11/08/2014 Elsevier Interactive Patient Education  2017 [place] Prevention in the Home Falls can cause injuries. They can happen to people of all ages. There are many things you can do to make your home safe and to help prevent falls. What can I do on the outside of my home?  Regularly fix the edges of walkways and driveways and fix any cracks.  Remove anything that might make you trip as you walk through a door, such as a raised step or threshold.  Trim any bushes or trees on the path to your home.  Use bright outdoor lighting.  Clear any walking paths of anything that might make someone trip, such as rocks or tools.  Regularly check to see if handrails are loose or broken. Make sure that both sides of any steps have handrails.  Any raised decks and porches should have guardrails on the edges.  Have any leaves, snow, or ice cleared regularly.  Use sand or salt on walking paths during winter.  Clean up any spills in your garage right away. This includes oil or grease spills. What can I do in the bathroom?  Use night lights.  Install grab bars by the toilet and in the tub and shower. Do not use towel bars as grab bars.  Use non-skid mats or decals in the tub or shower.  If  you need to sit down in the shower, use a plastic, non-slip stool.  Keep the floor dry. Clean up any water that spills on the floor as soon as it happens.  Remove soap buildup in the tub or shower regularly.  Attach bath mats securely with double-sided non-slip rug tape.  Do not have throw rugs and other things on the floor that can make you trip. What can I do in the bedroom?  Use night lights.  Make sure that you have a light by your bed that is easy to reach.  Do not use any sheets or blankets that are too big for your bed. They should not hang down onto the floor.  Have a firm chair that has side arms. You can use this for support while you get dressed.  Do not have throw rugs and other things on the floor that can make you trip. What can I do in the kitchen?  Clean up any spills right away.  Avoid walking on wet floors.  Keep items that you use a lot in easy-to-reach places.  If you need to reach something above you, use a strong step stool that has a grab bar.  Keep electrical cords  out of the way.  Do not use floor polish or wax that makes floors slippery. If you must use wax, use non-skid floor wax.  Do not have throw rugs and other things on the floor that can make you trip. What can I do with my stairs?  Do not leave any items on the stairs.  Make sure that there are handrails on both sides of the stairs and use them. Fix handrails that are broken or loose. Make sure that handrails are as long as the stairways.  Check any carpeting to make sure that it is firmly attached to the stairs. Fix any carpet that is loose or worn.  Avoid having throw rugs at the top or bottom of the stairs. If you do have throw rugs, attach them to the floor with carpet tape.  Make sure that you have a light switch at the top of the stairs and the bottom of the stairs. If you do not have them, ask someone to add them for you. What else can I do to help prevent falls?  Wear shoes  that:  Do not have high heels.  Have rubber bottoms.  Are comfortable and fit you well.  Are closed at the toe. Do not wear sandals.  If you use a stepladder:  Make sure that it is fully opened. Do not climb a closed stepladder.  Make sure that both sides of the stepladder are locked into place.  Ask someone to hold it for you, if possible.  Clearly mark and make sure that you can see:  Any grab bars or handrails.  First and last steps.  Where the edge of each step is.  Use tools that help you move around (mobility aids) if they are needed. These include:  Canes.  Walkers.  Scooters.  Crutches.  Turn on the lights when you go into a dark area. Replace any light bulbs as soon as they burn out.  Set up your furniture so you have a clear path. Avoid moving your furniture around.  If any of your floors are uneven, fix them.  If there are any pets around you, be aware of where they are.  Review your medicines with your doctor. Some medicines can make you feel dizzy. This can increase your chance of falling. Ask your doctor what other things that you can do to help prevent falls. This information is not intended to replace advice given to you by your health care provider. Make sure you discuss any questions you have with your health care provider. Document Released: 11/03/2008 Document Revised: 06/15/2015 Document Reviewed: 02/11/2014 Elsevier Interactive Patient Education  2017 [employer].

## 2019-10-21 ENCOUNTER — Encounter: Payer: Self-pay | Admitting: *Deleted

## 2019-10-21 NOTE — Telephone Encounter (Signed)
Former smoker, quit 2015, 23 pack year

## 2019-10-21 NOTE — Addendum Note (Signed)
Addended by: Lieutenant Diego on: 10/21/2019 09:09 AM   Modules accepted: Orders

## 2019-11-02 ENCOUNTER — Inpatient Hospital Stay: Payer: Medicare HMO | Attending: Hematology and Oncology

## 2019-11-02 ENCOUNTER — Other Ambulatory Visit: Payer: Self-pay

## 2019-11-02 DIAGNOSIS — D509 Iron deficiency anemia, unspecified: Secondary | ICD-10-CM

## 2019-11-02 DIAGNOSIS — N1832 Chronic kidney disease, stage 3b: Secondary | ICD-10-CM | POA: Insufficient documentation

## 2019-11-02 DIAGNOSIS — E538 Deficiency of other specified B group vitamins: Secondary | ICD-10-CM | POA: Diagnosis not present

## 2019-11-02 DIAGNOSIS — D631 Anemia in chronic kidney disease: Secondary | ICD-10-CM | POA: Insufficient documentation

## 2019-11-02 LAB — CBC WITH DIFFERENTIAL/PLATELET
Abs Immature Granulocytes: 0.04 10*3/uL (ref 0.00–0.07)
Basophils Absolute: 0.1 10*3/uL (ref 0.0–0.1)
Basophils Relative: 1 %
Eosinophils Absolute: 0.2 10*3/uL (ref 0.0–0.5)
Eosinophils Relative: 3 %
HCT: 32.9 % — ABNORMAL LOW (ref 36.0–46.0)
Hemoglobin: 11 g/dL — ABNORMAL LOW (ref 12.0–15.0)
Immature Granulocytes: 1 %
Lymphocytes Relative: 21 %
Lymphs Abs: 1.7 10*3/uL (ref 0.7–4.0)
MCH: 30 pg (ref 26.0–34.0)
MCHC: 33.4 g/dL (ref 30.0–36.0)
MCV: 89.6 fL (ref 80.0–100.0)
Monocytes Absolute: 0.6 10*3/uL (ref 0.1–1.0)
Monocytes Relative: 8 %
Neutro Abs: 5.5 10*3/uL (ref 1.7–7.7)
Neutrophils Relative %: 66 %
Platelets: 320 10*3/uL (ref 150–400)
RBC: 3.67 MIL/uL — ABNORMAL LOW (ref 3.87–5.11)
RDW: 12.5 % (ref 11.5–15.5)
WBC: 8.1 10*3/uL (ref 4.0–10.5)
nRBC: 0 % (ref 0.0–0.2)

## 2019-11-02 LAB — IRON AND TIBC
Iron: 69 ug/dL (ref 28–170)
Saturation Ratios: 18 % (ref 10.4–31.8)
TIBC: 382 ug/dL (ref 250–450)
UIBC: 313 ug/dL

## 2019-11-02 LAB — FOLATE: Folate: 48 ng/mL (ref >5.9)

## 2019-11-02 LAB — VITAMIN B12: Vitamin B-12: 1387 pg/mL — ABNORMAL HIGH (ref 180–914)

## 2019-11-02 LAB — FERRITIN: Ferritin: 24 ng/mL (ref 11–307)

## 2019-11-15 ENCOUNTER — Encounter (INDEPENDENT_AMBULATORY_CARE_PROVIDER_SITE_OTHER): Payer: Self-pay | Admitting: Vascular Surgery

## 2019-11-15 ENCOUNTER — Ambulatory Visit (INDEPENDENT_AMBULATORY_CARE_PROVIDER_SITE_OTHER): Payer: Medicare HMO

## 2019-11-15 ENCOUNTER — Other Ambulatory Visit: Payer: Self-pay

## 2019-11-15 ENCOUNTER — Ambulatory Visit (INDEPENDENT_AMBULATORY_CARE_PROVIDER_SITE_OTHER): Payer: Medicare HMO | Admitting: Vascular Surgery

## 2019-11-15 VITALS — BP 154/82 | HR 84 | Ht 62.0 in | Wt 183.0 lb

## 2019-11-15 DIAGNOSIS — E785 Hyperlipidemia, unspecified: Secondary | ICD-10-CM | POA: Insufficient documentation

## 2019-11-15 DIAGNOSIS — I739 Peripheral vascular disease, unspecified: Secondary | ICD-10-CM

## 2019-11-15 DIAGNOSIS — I10 Essential (primary) hypertension: Secondary | ICD-10-CM | POA: Diagnosis not present

## 2019-11-15 DIAGNOSIS — I890 Lymphedema, not elsewhere classified: Secondary | ICD-10-CM

## 2019-11-15 DIAGNOSIS — E118 Type 2 diabetes mellitus with unspecified complications: Secondary | ICD-10-CM

## 2019-11-15 DIAGNOSIS — E782 Mixed hyperlipidemia: Secondary | ICD-10-CM

## 2019-11-15 NOTE — Progress Notes (Signed)
MRN : [id number]  Misty Villarreal is a 69 y.o. ([birth year]) female who presents with chief complaint of PAD.  History of Present Illness:   The patient returns to the office for followup and review status post angiogram with interventionon 07/21/2018.  Procedure(s) Performed: 1. Introduction catheter intoleftlower extremity 3rd order catheter placement  2.Contrast injection leftlower extremity for distal runoff  3. Crosser atherectomy of theleftSFA and popliteal arteries 4. Percutaneous transluminal angioplastyleftsuperficial femoral artery and popliteal 5. Star close closurerightcommon femoral arteriotomy  The patient notes improvement in the lower extremity symptoms. No interval shortening of the patient's claudication distance or rest pain symptoms. Previous wounds have now healed. No new ulcers or wounds have occurred since the last visit.  She notes she has been very active this past summer she has been doing quite a bit of gardening and walking for that she also notes that she has been walking on the beach as well  There have been no significant changes to the patient's overall health care.  The patient denies amaurosis fugax or recent TIA symptoms. There are no recent neurological changes noted. The patient denies history of DVT, PE or superficial thrombophlebitis. The patient denies recent episodes of angina or shortness of breath.   ABI's Rt=1.04and Lt=0.82(previous ABI's Rt=0.95and Lt=0.72).  Previous duplex ultrasound of the arterial system bilaterally shows the SFA intervention site on the right is patent and the left is occluded  No outpatient medications have been marked as taking for the 11/15/19 encounter (Appointment) with Delana Meyer, Dolores Lory, MD.    Past Medical History:  Diagnosis Date  . Abnormal glandular Papanicolaou smear of cervix 09/01/2014   Normal Pap 2012   . Anemia    . Chronic kidney disease    stage 3  . Depression   . Diabetes mellitus without complication ([place])   . DVT (deep venous thrombosis) ([place])   . Hyperlipidemia   . Hypertension   . Peripheral vascular disease ([place])   . Peripheral vascular disease ([place])   . Slurred speech 12/03/2016    Past Surgical History:  Procedure Laterality Date  . ANGIOPLASTY / STENTING FEMORAL Left 2014, 2013  . BRAIN MENINGIOMA EXCISION  1988  . BREAST BIOPSY Right 1989   benign  . CARPAL TUNNEL RELEASE Left 1980  . CATARACT EXTRACTION W/PHACO Right 09/02/2019   Procedure: CATARACT EXTRACTION PHACO AND INTRAOCULAR LENS PLACEMENT (IOC);  Surgeon: Eulogio Bear, MD;  Location: [facility] ORS;  Service: Ophthalmology;  Laterality: Right;  [place] 00:27.3 CDE 1.92 AP% Fluid Pack lot # [id number]  . CATARACT EXTRACTION W/PHACO Left 10/18/2019   Procedure: CATARACT EXTRACTION PHACO AND INTRAOCULAR LENS PLACEMENT (IOC) LEFT DIABETIC 2.55  00:28.5;  Surgeon: Eulogio Bear, MD;  Location: [facility];  Service: Ophthalmology;  Laterality: Left;  Diabetic - oral meds  . CESAREAN SECTION    . CHOLECYSTECTOMY  1987  . COLONOSCOPY WITH PROPOFOL N/A 05/08/2017   Procedure: COLONOSCOPY WITH PROPOFOL;  Surgeon: Jonathon Bellows, MD;  Location: [facility] ENDOSCOPY;  Service: Gastroenterology;  Laterality: N/A;  . KNEE ARTHROPLASTY Right 2009  . LOWER EXTREMITY ANGIOGRAPHY Left 07/21/2018   Procedure: LOWER EXTREMITY ANGIOGRAPHY;  Surgeon: Katha Cabal, MD;  Location: [facility];  Service: Cardiovascular;  Laterality: Left;    Social History Social History   Tobacco Use  . Smoking status: Former Smoker    Packs/day: 0.50    Years: 46.00    Pack years: 23.00    Types: Cigarettes  Quit date: 04/05/2013    Years since quitting: 6.6  . Smokeless tobacco: Never Used  Vaping Use  . Vaping Use: Never used  Substance Use Topics  . Alcohol use: Yes    Alcohol/week: 2.0 standard drinks    Types: 2 Standard drinks  or equivalent per week    Comment: occasional  . Drug use: No    Family History Family History  Problem Relation Age of Onset  . Diabetes Mother   . Heart failure Father   . Diabetes Father   . Breast cancer Neg Hx     Allergies  Allergen Reactions  . Augmentin [Amoxicillin-Pot Clavulanate] Diarrhea  . Rosuvastatin Diarrhea  . Tetracycline     Other reaction(s): emesis  . Cefaclor Rash  . Cephalexin Rash  . Sulfa Antibiotics Rash    Other reaction(s): emesis     REVIEW OF SYSTEMS (Negative unless checked)  Constitutional: [] Weight loss  [] Fever  [] Chills Cardiac: [] Chest pain   [] Chest pressure   [] Palpitations   [] Shortness of breath when laying flat   [] Shortness of breath with exertion. Vascular:  [x] Pain in legs with walking   [] Pain in legs at rest  [] History of DVT   [] Phlebitis   [] Swelling in legs   [] Varicose veins   [] Non-healing ulcers Pulmonary:   [] Uses home oxygen   [] Productive cough   [] Hemoptysis   [] Wheeze  [] COPD   [] Asthma Neurologic:  [] Dizziness   [] Seizures   [] History of stroke   [] History of TIA  [] Aphasia   [] Vissual changes   [] Weakness or numbness in arm   [] Weakness or numbness in leg Musculoskeletal:   [] Joint swelling   [] Joint pain   [] Low back pain Hematologic:  [] Easy bruising  [] Easy bleeding   [] Hypercoagulable state   [] Anemic Gastrointestinal:  [] Diarrhea   [] Vomiting  [] Gastroesophageal reflux/heartburn   [] Difficulty swallowing. Genitourinary:  [] Chronic kidney disease   [] Difficult urination  [] Frequent urination   [] Blood in urine Skin:  [] Rashes   [] Ulcers  Psychological:  [] History of anxiety   []  History of major depression.  Physical Examination  There were no vitals filed for this visit. There is no height or weight on file to calculate BMI. Gen: WD/WN, NAD Head: [place]/AT, No temporalis wasting.  Ear/Nose/Throat: Hearing grossly intact, nares w/o erythema or drainage Eyes: PER, EOMI, sclera nonicteric.  Neck: Supple, no  large masses.   Pulmonary:  Good air movement, no audible wheezing bilaterally, no use of accessory muscles.  Cardiac: RRR, no JVD Vascular:  Vessel Right Left  Radial Palpable Palpable  Gastrointestinal: Non-distended. No guarding/no peritoneal signs.  Musculoskeletal: M/S 5/5 throughout.  No deformity or atrophy.  Neurologic: CN 2-12 intact. Symmetrical.  Speech is fluent. Motor exam as listed above. Psychiatric: Judgment intact, Mood & affect appropriate for pt's clinical situation. Dermatologic: No rashes or ulcers noted.  No changes consistent with cellulitis.   CBC Lab Results  Component Value Date   WBC 8.1 11/02/2019   HGB 11.0 (L) 11/02/2019   HCT 32.9 (L) 11/02/2019   MCV 89.6 11/02/2019   PLT 320 11/02/2019    BMET    Component Value Date/Time   NA 137 06/28/2019 1041   NA 138 12/23/2018 1350   NA 139 04/27/2013 0713   K 5.0 06/28/2019 1041   K 4.2 04/27/2013 0713   CL 104 06/28/2019 1041   CL 108 (H) 04/27/2013 0713   CO2 24 06/28/2019 1041   CO2 25 04/27/2013 0713   GLUCOSE 155 (H)  06/28/2019 1041   GLUCOSE 137 (H) 04/27/2013 0713   BUN 22 06/28/2019 1041   BUN 40 (H) 12/23/2018 1350   BUN 21 (H) 04/27/2013 0713   CREATININE 1.43 (H) 06/28/2019 1041   CREATININE 1.28 04/27/2013 0713   CALCIUM 8.9 06/28/2019 1041   CALCIUM 9.3 04/27/2013 0713   GFRNONAA 37 (L) 06/28/2019 1041   GFRNONAA 44 (L) 04/27/2013 0713   GFRAA 43 (L) 06/28/2019 1041   GFRAA 52 (L) 04/27/2013 0713   CrCl cannot be calculated (Patient's most recent lab result is older than the maximum 21 days allowed.).  COAG Lab Results  Component Value Date   INR 0.85 12/03/2016    Radiology No results found.   Assessment/Plan 1. PAD (peripheral artery disease) (HCC)  Recommend:  The patient has evidence of atherosclerosis of the lower extremities with claudication.  The patient does not voice lifestyle limiting changes at this point in time.  Noninvasive studies do not suggest  clinically significant change.  No invasive studies, angiography or surgery at this time The patient should continue walking and begin a more formal exercise program.  The patient should continue antiplatelet therapy and aggressive treatment of the lipid abnormalities  No changes in the patient's medications at this time  The patient should continue wearing graduated compression socks 10-15 mmHg strength to control the mild edema.   - VAS [place] ABI WITH/WO TBI; Future  2. Lymphedema  No surgery or intervention at this point in time.    I have reviewed my previous discussion with the patient regarding swelling and why it  causes symptoms.  The patient is doing well with compression and will continue wearing graduated compression stockings class 1 (20-30 mmHg) on a daily basis a prescription was given. The patient will  continue wearing the stockings first thing in the morning and removing them in the evening. The patient is instructed specifically not to sleep in the stockings.    In addition, behavioral modification including elevation during the day and exercise will be continued.    3. Essential (primary) hypertension Continue antihypertensive medications as already ordered, these medications have been reviewed and there are no changes at this time.   4. Type II diabetes mellitus with complication ([place]) Continue hypoglycemic medications as already ordered, these medications have been reviewed and there are no changes at this time.  Hgb A1C to be monitored as already arranged by primary service   5. Mixed hyperlipidemia Continue statin as ordered and reviewed, no changes at this time    Hortencia Pilar, MD  11/15/2019 8:40 AM

## 2019-11-16 ENCOUNTER — Ambulatory Visit
Admission: RE | Admit: 2019-11-16 | Discharge: 2019-11-16 | Disposition: A | Discharge status: Home / Self Care | Payer: Medicare HMO | Source: Ambulatory Visit | Attending: Nurse Practitioner | Admitting: Nurse Practitioner

## 2019-11-16 ENCOUNTER — Inpatient Hospital Stay (HOSPITAL_BASED_OUTPATIENT_CLINIC_OR_DEPARTMENT_OTHER): Payer: Medicare HMO | Admitting: Hospice and Palliative Medicine

## 2019-11-16 DIAGNOSIS — Z122 Encounter for screening for malignant neoplasm of respiratory organs: Secondary | ICD-10-CM | POA: Diagnosis not present

## 2019-11-16 DIAGNOSIS — Z87891 Personal history of nicotine dependence: Secondary | ICD-10-CM | POA: Insufficient documentation

## 2019-11-16 NOTE — Progress Notes (Signed)
Virtual Visit via Video Note  I connected with@ on 11/16/19 at@ by a video enabled telemedicine application and verified that I am speaking with the correct person using two identifiers.   I discussed the limitations of evaluation and management by telemedicine and the availability of in person appointments. The patient expressed understanding and agreed to proceed.  In accordance with CMS guidelines, patient has met eligibility criteria including age, absence of signs or symptoms of lung cancer.  Social History   Tobacco Use  . Smoking status: Former Smoker    Packs/day: 0.50    Years: 46.00    Pack years: 23.00    Types: Cigarettes    Quit date: 04/05/2013    Years since quitting: 6.6  . Smokeless tobacco: Never Used  Vaping Use  . Vaping Use: Never used  Substance Use Topics  . Alcohol use: Yes    Alcohol/week: 2.0 standard drinks    Types: 2 Standard drinks or equivalent per week    Comment: occasional  . Drug use: No      A shared decision-making session was conducted prior to the performance of CT scan. This includes one or more decision aids, includes benefits and harms of screening, follow-up diagnostic testing, over-diagnosis, false positive rate, and total radiation exposure.   Counseling on the importance of adherence to annual lung cancer LDCT screening, impact of co-morbidities, and ability or willingness to undergo diagnosis and treatment is imperative for compliance of the program.   Counseling on the importance of continued smoking cessation for former smokers; the importance of smoking cessation for current smokers, and information about tobacco cessation interventions have been given to patient including [place] and 1800 quit [place] programs.   Written order for lung cancer screening with LDCT has been given to the patient and any and all questions have been answered to the best of my abilities.    Yearly follow up will be coordinated by Burgess Estelle,  Thoracic Navigator.  Time Total: 15 minutes  Visit consisted of counseling and education dealing with complex health screening. Greater than 50%  of this time was spent counseling and coordinating care related to the above assessment and plan.  Signed by: Altha Harm, PhD, NP-C

## 2019-11-18 ENCOUNTER — Encounter: Payer: Self-pay | Admitting: *Deleted

## 2019-11-19 ENCOUNTER — Encounter: Payer: Self-pay | Admitting: Internal Medicine

## 2019-11-19 DIAGNOSIS — I700 Atherosclerosis of aorta: Secondary | ICD-10-CM | POA: Insufficient documentation

## 2019-11-23 ENCOUNTER — Ambulatory Visit: Payer: Medicare HMO

## 2019-12-03 ENCOUNTER — Ambulatory Visit
Admission: RE | Admit: 2019-12-03 | Discharge: 2019-12-03 | Disposition: A | Discharge status: Home / Self Care | Payer: Medicare HMO | Source: Ambulatory Visit | Attending: Internal Medicine | Admitting: Internal Medicine

## 2019-12-03 ENCOUNTER — Other Ambulatory Visit: Payer: Self-pay

## 2019-12-03 DIAGNOSIS — Z1231 Encounter for screening mammogram for malignant neoplasm of breast: Secondary | ICD-10-CM

## 2019-12-27 ENCOUNTER — Other Ambulatory Visit: Payer: Self-pay

## 2019-12-27 ENCOUNTER — Inpatient Hospital Stay: Payer: Medicare HMO | Attending: Hematology and Oncology

## 2019-12-27 DIAGNOSIS — Z7984 Long term (current) use of oral hypoglycemic drugs: Secondary | ICD-10-CM | POA: Diagnosis not present

## 2019-12-27 DIAGNOSIS — Z86718 Personal history of other venous thrombosis and embolism: Secondary | ICD-10-CM | POA: Diagnosis not present

## 2019-12-27 DIAGNOSIS — R5383 Other fatigue: Secondary | ICD-10-CM | POA: Insufficient documentation

## 2019-12-27 DIAGNOSIS — Z79899 Other long term (current) drug therapy: Secondary | ICD-10-CM | POA: Insufficient documentation

## 2019-12-27 DIAGNOSIS — D801 Nonfamilial hypogammaglobulinemia: Secondary | ICD-10-CM | POA: Diagnosis not present

## 2019-12-27 DIAGNOSIS — D509 Iron deficiency anemia, unspecified: Secondary | ICD-10-CM | POA: Diagnosis present

## 2019-12-27 DIAGNOSIS — E538 Deficiency of other specified B group vitamins: Secondary | ICD-10-CM | POA: Insufficient documentation

## 2019-12-27 DIAGNOSIS — Z87891 Personal history of nicotine dependence: Secondary | ICD-10-CM | POA: Diagnosis not present

## 2019-12-27 DIAGNOSIS — E1151 Type 2 diabetes mellitus with diabetic peripheral angiopathy without gangrene: Secondary | ICD-10-CM | POA: Insufficient documentation

## 2019-12-27 DIAGNOSIS — E1122 Type 2 diabetes mellitus with diabetic chronic kidney disease: Secondary | ICD-10-CM | POA: Diagnosis not present

## 2019-12-27 DIAGNOSIS — N1832 Chronic kidney disease, stage 3b: Secondary | ICD-10-CM | POA: Diagnosis not present

## 2019-12-27 DIAGNOSIS — Z7982 Long term (current) use of aspirin: Secondary | ICD-10-CM | POA: Insufficient documentation

## 2019-12-27 DIAGNOSIS — E119 Type 2 diabetes mellitus without complications: Secondary | ICD-10-CM | POA: Diagnosis not present

## 2019-12-27 DIAGNOSIS — R252 Cramp and spasm: Secondary | ICD-10-CM | POA: Diagnosis not present

## 2019-12-27 DIAGNOSIS — Z803 Family history of malignant neoplasm of breast: Secondary | ICD-10-CM | POA: Diagnosis not present

## 2019-12-27 DIAGNOSIS — F329 Major depressive disorder, single episode, unspecified: Secondary | ICD-10-CM | POA: Insufficient documentation

## 2019-12-27 LAB — CBC WITH DIFFERENTIAL/PLATELET
Abs Immature Granulocytes: 0.05 10*3/uL (ref 0.00–0.07)
Basophils Absolute: 0.1 10*3/uL (ref 0.0–0.1)
Basophils Relative: 1 %
Eosinophils Absolute: 0.4 10*3/uL (ref 0.0–0.5)
Eosinophils Relative: 5 %
HCT: 33.3 % — ABNORMAL LOW (ref 36.0–46.0)
Hemoglobin: 11.4 g/dL — ABNORMAL LOW (ref 12.0–15.0)
Immature Granulocytes: 1 %
Lymphocytes Relative: 21 %
Lymphs Abs: 1.7 10*3/uL (ref 0.7–4.0)
MCH: 30.5 pg (ref 26.0–34.0)
MCHC: 34.2 g/dL (ref 30.0–36.0)
MCV: 89 fL (ref 80.0–100.0)
Monocytes Absolute: 0.6 10*3/uL (ref 0.1–1.0)
Monocytes Relative: 7 %
Neutro Abs: 5.3 10*3/uL (ref 1.7–7.7)
Neutrophils Relative %: 65 %
Platelets: 336 10*3/uL (ref 150–400)
RBC: 3.74 MIL/uL — ABNORMAL LOW (ref 3.87–5.11)
RDW: 12.6 % (ref 11.5–15.5)
WBC: 8 10*3/uL (ref 4.0–10.5)
nRBC: 0 % (ref 0.0–0.2)

## 2019-12-27 LAB — IRON AND TIBC
Iron: 88 ug/dL (ref 28–170)
Saturation Ratios: 23 % (ref 10.4–31.8)
TIBC: 379 ug/dL (ref 250–450)
UIBC: 291 ug/dL

## 2019-12-27 LAB — FERRITIN: Ferritin: 30 ng/mL (ref 11–307)

## 2019-12-27 NOTE — Progress Notes (Signed)
[facility]  [address], [address] [place], [place] [address] Phone: [phone]  Fax: [phone]   Clinic Day:  12/28/2019  Referring physician: Glean Hess, MD  Chief Complaint: Misty Villarreal is a 69 y.o. female with stage IIIBchronic kidney disease, anemia, and hypogammaglobulinemia who is seen for 4 month assessment.  HPI:  The patient was last seen in the hematology clinic on 09/07/2019. At that time, she had felt "tired." She woke up feeling refreshed but ran out of energy quickly throughout the day. She was taking 2-hour naps during the day.  Exam was stable. Hematocrit was 35.4, hemoglobin 11.6, MCV 92.9, platelets 316,000, WBC 7,300. Ferritin was 50 with an iron saturation of 25% and a TIBC of 375. TSH was 2.575. She continued oral B12.  The patient underwent left cataract surgery on 10/18/2019 by Dr. Edison Pace.  Low dose chest CT on 11/16/2019 revealed Lung-RADS 2S, benign appearance or behavior. There was mild diffuse bronchial wall thickening with very mild centrilobular and paraseptal emphysema; imaging findings suggestive of underlying COPD. There were calcifications of the aortic valve. Echocardiographic correlation for evaluation of potential valvular dysfunction may be warranted if clinically indicated.  The patient saw Dr. Holley Raring on 11/22/2019 for a follow-up. Her chronic kidney disease was felt to be stable. She continued lisinopril 20 mg. Follow-up was planned for 4 months.  Screening mammogram on 12/03/2019 revealed no evidence of malignancy.  Labs on 11/02/2019 revealed a hematocrit of 32.9, hemoglobin 11.0, MCV 89.6, platelets 320,000, WBC 8,100.  Vitamin B12 was 1,387 and folate 48.0.  Ferritin was 24 with an iron saturation of 18% and a TIBC of 382.  During the interim, she has been better overall. However, she visited [place] for Thanksgiving and developed a cough. She tested negative for COVID-19 last week. She has been run down  over the past week and has been taking a lot of cold medication.  The patient also reports leg cramping. Her diet is okay. She denies blood in the stool, black stools, and bleeding of any kind. She stopped taking oral iron in 11/2018 because it caused severe stomach cramping and diarrhea.  She states that she does not have sleep apnea.  She would like IV iron today.   Past Medical History:  Diagnosis Date  . Abnormal glandular Papanicolaou smear of cervix 09/01/2014   Normal Pap 2012   . Anemia   . Chronic kidney disease    stage 3  . Depression   . Diabetes mellitus without complication ([place])   . DVT (deep venous thrombosis) ([place])   . Hyperlipidemia   . Hypertension   . Peripheral vascular disease ([place])   . Peripheral vascular disease ([place])   . Slurred speech 12/03/2016    Past Surgical History:  Procedure Laterality Date  . ANGIOPLASTY / STENTING FEMORAL Left 2014, 2013  . BRAIN MENINGIOMA EXCISION  1988  . BREAST BIOPSY Right 1989   benign  . CARPAL TUNNEL RELEASE Left 1980  . CATARACT EXTRACTION W/PHACO Right 09/02/2019   Procedure: CATARACT EXTRACTION PHACO AND INTRAOCULAR LENS PLACEMENT (IOC);  Surgeon: Eulogio Bear, MD;  Location: [facility] ORS;  Service: Ophthalmology;  Laterality: Right;  [place] 00:27.3 CDE 1.92 AP% Fluid Pack lot # [id number]  . CATARACT EXTRACTION W/PHACO Left 10/18/2019   Procedure: CATARACT EXTRACTION PHACO AND INTRAOCULAR LENS PLACEMENT (IOC) LEFT DIABETIC 2.55  00:28.5;  Surgeon: Eulogio Bear, MD;  Location: [facility];  Service: Ophthalmology;  Laterality: Left;  Diabetic -  oral meds  . CESAREAN SECTION    . CHOLECYSTECTOMY  1987  . COLONOSCOPY WITH PROPOFOL N/A 05/08/2017   Procedure: COLONOSCOPY WITH PROPOFOL;  Surgeon: Jonathon Bellows, MD;  Location: [facility] ENDOSCOPY;  Service: Gastroenterology;  Laterality: N/A;  . KNEE ARTHROPLASTY Right 2009  . LOWER EXTREMITY ANGIOGRAPHY Left 07/21/2018   Procedure: LOWER EXTREMITY ANGIOGRAPHY;   Surgeon: Katha Cabal, MD;  Location: [facility];  Service: Cardiovascular;  Laterality: Left;    Family History  Problem Relation Age of Onset  . Diabetes Mother   . Heart failure Father   . Diabetes Father   . Breast cancer Neg Hx     Social History:  reports that she quit smoking about 6 years ago. Her smoking use included cigarettes. She has a 23.00 pack-year smoking history. She has never used smokeless tobacco. She reports current alcohol use of about 2.0 standard drinks of alcohol per week. She reports that she does not use drugs. She previously smoked a pack of cigarettes a day for 40 years. She drinks red wine occasionally.He denies any exposure to radiation or toxins. She is retired. 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 Her daughter's name is [name]. Her husband, [name], died in June 27, 2018. She lives in [place]. The patient is alone today.  Allergies:  Allergies  Allergen Reactions  . Augmentin [Amoxicillin-Pot Clavulanate] Diarrhea  . Rosuvastatin Diarrhea  . Tetracycline     Other reaction(s): emesis  . Cefaclor Rash  . Cephalexin Rash  . Sulfa Antibiotics Rash    Other reaction(s): emesis    Current Medications: Current Outpatient Medications  Medication Sig Dispense Refill  . aspirin 81 MG chewable tablet Chew 81 mg by mouth daily.     Marland Kitchen atorvastatin (LIPITOR) 40 MG tablet TAKE 1 TABLET DAILY (Patient taking differently: Take 40 mg by mouth daily. ) 90 tablet 3  . Cholecalciferol (VITAMIN D) 50 MCG (2000 UT) CAPS Take 2 capsules by mouth daily.    . clopidogrel (PLAVIX) 75 MG tablet TAKE 1 TABLET DAILY (Patient taking differently: Take 75 mg by mouth daily. ) 90 tablet 3  . Cyanocobalamin (VITAMIN B 12 PO) Take 1,000 mcg by mouth daily. 1000mg  daily     . fexofenadine (ALLEGRA) 180 MG tablet Take 180 mg by mouth daily.     Marland Kitchen lisinopril (ZESTRIL) 20 MG tablet TAKE 1 TABLET DAILY (REPLACES LISINOPRIL HCT) (Patient taking  differently: Take 20 mg by mouth daily. ) 90 tablet 3  . meclizine (ANTIVERT) 12.5 MG tablet Take 1 tablet (12.5 mg total) by mouth 2 (two) times daily as needed for dizziness. 60 tablet 2  . metFORMIN (GLUCOPHAGE) 500 MG tablet TAKE 1 TABLET TWICE A DAY (Patient taking differently: Take 500 mg by mouth 2 (two) times daily with a meal. ) 180 tablet 3  . Multiple Vitamin tablet Take 1 tablet by mouth daily.     . sertraline (ZOLOFT) 50 MG tablet Take 1 tablet (50 mg total) by mouth daily. 90 tablet 3   No current facility-administered medications for this visit.    Review of Systems  Constitutional: Positive for malaise/fatigue (run down). Negative for chills, diaphoresis, fever and weight loss (up 5 lbs).       Feels "better" overall.  HENT: Negative.  Negative for congestion, ear discharge, ear pain, hearing loss, nosebleeds, sinus pain, sore throat and tinnitus.   Eyes: Negative for blurred vision, double vision and photophobia.  Respiratory: Positive for cough. Negative for hemoptysis, sputum  production and shortness of breath.   Cardiovascular: Negative for chest pain, palpitations, orthopnea and leg swelling.       Multiple blockages in legs on Plavix.  Gastrointestinal: Negative for abdominal pain, blood in stool, constipation, diarrhea (on Imodium), heartburn, melena, nausea and vomiting.       IBS.  No prior EGD.  Genitourinary: Negative.  Negative for dysuria, frequency, hematuria and urgency.  Musculoskeletal: Positive for myalgias (leg cramps). Negative for back pain, joint pain and neck pain.       Chronic leg pain.  Skin: Negative.  Negative for itching and rash.  Neurological: Negative.  Negative for dizziness, tingling, tremors, sensory change, speech change, focal weakness, weakness and headaches.  Endo/Heme/Allergies: Negative for environmental allergies (on Allegra). Does not bruise/bleed easily.  Psychiatric/Behavioral: Positive for depression. Negative for hallucinations  and memory loss. The patient is not nervous/anxious and does not have insomnia.   All other systems reviewed and are negative.  Performance status (ECOG):  1  Vital Signs: Blood pressure (!) 154/63, pulse 87, temperature 97.8 F (36.6 C), resp. rate 20, weight 189 lb 7.8 oz (86 kg), SpO2 99 %.   Physical Exam Vitals and nursing note reviewed.  Constitutional:      General: She is not in acute distress.    Appearance: She is well-developed. She is not diaphoretic.  HENT:     Head: Normocephalic and atraumatic.     Comments: Short gray hair.    Mouth/Throat:     Mouth: Mucous membranes are moist.     Pharynx: Oropharynx is clear.  Eyes:     General: No scleral icterus.    Extraocular Movements: Extraocular movements intact.     Conjunctiva/sclera: Conjunctivae normal.     Pupils: Pupils are equal, round, and reactive to light.     Comments: Glasses.  Blue eyes.  Cardiovascular:     Rate and Rhythm: Normal rate and regular rhythm.     Heart sounds: Normal heart sounds. No murmur heard.   Pulmonary:     Effort: Pulmonary effort is normal. No respiratory distress.     Breath sounds: Normal breath sounds. No wheezing or rales.  Chest:     Chest wall: No tenderness.  Abdominal:     General: Bowel sounds are normal. There is no distension.     Palpations: Abdomen is soft. There is no hepatomegaly, splenomegaly or mass.     Tenderness: There is no abdominal tenderness. There is no guarding or rebound.  Musculoskeletal:        General: No swelling or tenderness. Normal range of motion.     Cervical back: Normal range of motion and neck supple.  Lymphadenopathy:     Head:     Right side of head: No preauricular, posterior auricular or occipital adenopathy.     Left side of head: No preauricular, posterior auricular or occipital adenopathy.     Cervical: No cervical adenopathy.     Upper Body:     Right upper body: No supraclavicular or axillary adenopathy.     Left upper body:  No supraclavicular or axillary adenopathy.     Lower Body: No right inguinal adenopathy. No left inguinal adenopathy.  Skin:    General: Skin is warm and dry.  Neurological:     Mental Status: She is alert and oriented to person, place, and time.  Psychiatric:        Behavior: Behavior normal.        Thought Content: Thought content  normal.        Judgment: Judgment normal.    Appointment on 12/27/2019  Component Date Value Ref Range Status  . WBC 12/27/2019 8.0  4.0 - 10.5 K/uL Final  . RBC 12/27/2019 3.74* 3.87 - 5.11 MIL/uL Final  . Hemoglobin 12/27/2019 11.4* 12.0 - 15.0 g/dL Final  . HCT 12/27/2019 33.3* 36 - 46 % Final  . MCV 12/27/2019 89.0  80.0 - 100.0 fL Final  . MCH 12/27/2019 30.5  26.0 - 34.0 pg Final  . MCHC 12/27/2019 34.2  30.0 - 36.0 g/dL Final  . RDW 12/27/2019 12.6  11.5 - 15.5 % Final  . Platelets 12/27/2019 336  150 - 400 K/uL Final  . nRBC 12/27/2019 0.0  0.0 - 0.2 % Final  . Neutrophils Relative % 12/27/2019 65  % Final  . Neutro Abs 12/27/2019 5.3  1.7 - 7.7 K/uL Final  . Lymphocytes Relative 12/27/2019 21  % Final  . Lymphs Abs 12/27/2019 1.7  0.7 - 4.0 K/uL Final  . Monocytes Relative 12/27/2019 7  % Final  . Monocytes Absolute 12/27/2019 0.6  0.1 - 1.0 K/uL Final  . Eosinophils Relative 12/27/2019 5  % Final  . Eosinophils Absolute 12/27/2019 0.4  0.0 - 0.5 K/uL Final  . Basophils Relative 12/27/2019 1  % Final  . Basophils Absolute 12/27/2019 0.1  0.0 - 0.1 K/uL Final  . Immature Granulocytes 12/27/2019 1  % Final  . Abs Immature Granulocytes 12/27/2019 0.05  0.00 - 0.07 K/uL Final   Performed at [facility], [address], [place], [place] [address]  . Iron 12/27/2019 88  28 - 170 ug/dL Final  . TIBC 12/27/2019 379  250 - 450 ug/dL Final  . Saturation Ratios 12/27/2019 23  10.4 - 31.8 % Final  . UIBC 12/27/2019 291  ug/dL Final   Performed at [facility], [address] [place], [place] [address]  . Ferritin 12/27/2019  30  11 - 307 ng/mL Final   Performed at [facility], [address] [place], [place] [address]    Assessment:  Misty Villarreal is a 69 y.o. female with stage IIIBchronic kidney diseaseand a normocytic anemiaand hypogammaglobulinemia.Dietis fair. She is intolerant of oral iron.  Labs on 08/28/2020revealed no M-spike but hypogammaglobulinemia. Random UPEP and ANAwere negative. Labs on 10/02/2020revealed a hematocrit 29.7, hemoglobin 9.8, MCV 88.2, platelets 309,000,and WBC7,200. Albumen and calcium were normal. Iron saturation 18% with a TIBC 357.  Work upon 11/11/2018:hematocrit 32.2, hemoglobin 10.7, MCV 89.2, platelets 303,000,andWBC 6,000. Ferritinwas11(low).B12was 104 (low). Normal studiesincluded: folate,TSH, SPEP, immunoglobulin levels, free light chain ratio,24 hour UPEP and free light chains.  She received Venofer weekly x 3 (12/15/2018 - 12/29/2018).  Ferritin has been followed: 25 on 06/21/2015, 11 on 11/11/2018, 24 on 12/14/2018, 124 on 01/26/2019, 100 on 03/08/2019, 55 on 06/28/2019, 50 on 09/06/2019, 24 on 11/02/2019, and 30 on 12/27/2019.  She is on oral iron.  She has B12 deficiency.  B12 was 104 on 11/11/2018, 812 on 12/14/2018, and 1387 on 11/02/2019.  She is on oral B12.  Anti-parietal antibody and intrinsic factor antibody were normal on 12/14/2018. Folate was 48 on 11/02/2019.  She has stage IIIB chronic kidney diseasefelt secondary to diabetes. Creatinine was 1.36 on 10/23/2018 (CrCl 39 ml/min).   Colonoscopyon 05/08/2017 revealed diverticulosis and non-bleeding internal hemorrhoids.  The patient received both doses of the COVID-19 vaccine in 03/2019.  Symptomatically, she has been better overall. She tested negative for COVID-19 last week. She has  been run down over the past week and has been taking a lot of cold medication. Her diet is okay. She denies blood in the stool, black stools, and bleeding of any kind. She  stopped taking oral iron in 11/2018 because it caused severe stomach cramping and diarrhea.  Hemoglobin is 11.4.  Plan: 1.   Review labs from 12/27/2019. 2.   Iron deficiency anemia  Hematocrit 34.5. Hemoglobin 11.6. MCV 87.6 on 03/08/2019. Ferritin 100.  Hematocrit 35.4.  Hemoglobin 11.6.  MCV 92.9 on 09/06/2019.   Ferritin 50 with an iron saturation of 25% and a TIBC of 375.  Hematocrit 33.3.  Hemoglobin 11.4.  MCV 89.0 on 12/27/2019.   Ferritin 30 with an iron saturation of 23% and a TIBC 379. She denies any bleeding.             Ferritin goal is 100.  IV iron if ferritin <= 30.    Last colonoscopy was in 2019.She has never had an EGD. Venofer today 3.B12 deficiency B12 was 104 on 11/11/2018 and 1387 on 11/02/2019. She remains on oral B12. B12 goal is 400. No evidence of pernicious anemia.   Folate was 48 on 11/02/2019.  Monitor B12 and folate annually. 4.   Fatigue  She has been rundown in the past week with upper respiratory symptoms.  She tested negative for COVID-19  She denies any sleep apnea (consider overnight oximetry).  Continue to monitor. 5.   Venofer today. 6.   RTC in 3 months for labs (CBC with diff, ferritin, iron studies). 7.   RTC in 6 months for MD assessment, labs (CBC with diff, ferritin, iron studies- day before) and +/- Venofer.  I discussed the assessment and treatment plan with the patient.  The patient was provided an opportunity to ask questions and all were answered.  The patient agreed with the plan and demonstrated an understanding of the instructions.  The patient was advised to call back if the symptoms worsen or if the condition fails to improve as anticipated.   Lequita Asal, MD, PhD    12/28/2019, 1:25 PM  I, Mirian Mo Tufford, am acting as Education administrator for [employer]. Mike Gip, MD, PhD.  I, Tharon Bomar C. Mike Gip, MD, have reviewed the above  documentation for accuracy and completeness, and I agree with the above.

## 2019-12-28 ENCOUNTER — Encounter: Payer: Self-pay | Admitting: Hematology and Oncology

## 2019-12-28 ENCOUNTER — Inpatient Hospital Stay: Payer: Medicare HMO

## 2019-12-28 ENCOUNTER — Inpatient Hospital Stay: Payer: Medicare HMO | Admitting: Hematology and Oncology

## 2019-12-28 VITALS — BP 147/84 | HR 88 | Resp 20

## 2019-12-28 VITALS — BP 154/63 | HR 87 | Temp 97.8°F | Resp 20 | Wt 189.5 lb

## 2019-12-28 DIAGNOSIS — D509 Iron deficiency anemia, unspecified: Secondary | ICD-10-CM

## 2019-12-28 DIAGNOSIS — D631 Anemia in chronic kidney disease: Secondary | ICD-10-CM

## 2019-12-28 DIAGNOSIS — N1832 Chronic kidney disease, stage 3b: Secondary | ICD-10-CM

## 2019-12-28 DIAGNOSIS — E538 Deficiency of other specified B group vitamins: Secondary | ICD-10-CM

## 2019-12-28 MED ORDER — SODIUM CHLORIDE 0.9 % IV SOLN: 200.0000 mg | Freq: Once | INTRAVENOUS | Status: DC

## 2019-12-28 MED ORDER — SODIUM CHLORIDE 0.9 % IV SOLN
Freq: Once | INTRAVENOUS | Status: AC
  Filled 2019-12-28: qty 250

## 2019-12-28 MED ORDER — IRON SUCROSE 20 MG/ML IV SOLN
200.0000 mg | Freq: Once | INTRAVENOUS | Status: AC
  Administered 2019-12-28: 200 mg via INTRAVENOUS
  Filled 2019-12-28: qty 10

## 2019-12-28 NOTE — Progress Notes (Signed)
Pt received prescribed treatment in clinic, pt stable at d/c. 

## 2019-12-31 ENCOUNTER — Ambulatory Visit: Payer: Self-pay

## 2019-12-31 NOTE — Telephone Encounter (Signed)
Noted, Dr. Army Melia informed patient was sent to the ED.

## 2019-12-31 NOTE — Telephone Encounter (Signed)
Patient called and says last night she woke up out of her sleep with the feeling of heart burn, sweating. She says she got the pulse ox and her HR was 158. She says she took Rolaids and a baby ASA, the after the HR went down, she felt better and went to bed. She says maybe 20 minutes ago, she felt the same way and her HR was 138. By the time she connected to NT, the HR was going down to 86. She says she still feels a little burning in the chest like heartburn, but no chest pain, no dizziness, no nausea. She says she felt a little flushed. I advised the patient to go to the ED. She asks if she could go to the UC, because of the long wait over 2 hours in the ED. I advised the UC would advise the ED because of higher level of care due to her symptoms. She mentioned that her oncologist wanted her to follow up with Dr. Army Melia about something seen on her heart scan. I called the office and spoke to [place], [facility] about the symptoms above. She spoke to Dr. Gaspar Cola nurse and was advised for the patient to go to the ED. I advised the patient to go to the ED and she agreed.   Reason for Disposition . Age > 60 years (Exception: brief heartbeat symptoms that went away and now feels well)  Answer Assessment - Initial Assessment Questions 1. DESCRIPTION: "Please describe your heart rate or heartbeat that you are having" (e.g., fast/slow, regular/irregular, skipped or extra beats, "palpitations")     Fast heart rate 2. ONSET: "When did it start?" (Minutes, hours or days)      Last night felt burning and HR 158 3. DURATION: "How long does it last" (e.g., seconds, minutes, hours)     20 minutes  4. PATTERN "Does it come and go, or has it been constant since it started?"  "Does it get worse with exertion?"   "Are you feeling it now?"     Comes and goes; feeling like heartburn that's going away 5. TAP: "Using your hand, can you tap out what you are feeling on a chair or table in front of you, so that I can hear?" (Note:  not all patients can do this)        N/A 6. HEART RATE: "Can you tell me your heart rate?" "How many beats in 15 seconds?"  (Note: not all patients can do this)       138 when I called, but now down to 86 7. RECURRENT SYMPTOM: "Have you ever had this before?" If Yes, ask: "When was the last time?" and "What happened that time?"      Yes, a while ago 8. CAUSE: "What do you think is causing the palpitations?"     I don't know 9. CARDIAC HISTORY: "Do you have any history of heart disease?" (e.g., heart attack, angina, bypass surgery, angioplasty, arrhythmia)      No 10. OTHER SYMPTOMS: "Do you have any other symptoms?" (e.g., dizziness, chest pain, sweating, difficulty breathing)      Burning in chest, jittery feeling 11. PREGNANCY: "Is there any chance you are pregnant?" "When was your last menstrual period?"       No  Protocols used: HEART RATE AND HEARTBEAT QUESTIONS-A-AH

## 2020-01-02 ENCOUNTER — Encounter: Payer: Self-pay | Admitting: Internal Medicine

## 2020-01-11 ENCOUNTER — Encounter: Payer: Self-pay | Admitting: Internal Medicine

## 2020-01-11 ENCOUNTER — Other Ambulatory Visit: Payer: Self-pay

## 2020-01-11 ENCOUNTER — Ambulatory Visit (INDEPENDENT_AMBULATORY_CARE_PROVIDER_SITE_OTHER): Payer: Medicare HMO | Admitting: Internal Medicine

## 2020-01-11 VITALS — BP 128/76 | HR 80 | Ht 62.0 in | Wt 188.0 lb

## 2020-01-11 DIAGNOSIS — R000 Tachycardia, unspecified: Secondary | ICD-10-CM

## 2020-01-11 NOTE — Progress Notes (Signed)
Date:  01/11/2020   Name:  Misty Villarreal   DOB:  [birth year]   MRN:  [id number]   Chief Complaint: Gastroesophageal Reflux (Patient having symptoms of waking up with avid reflux in throat. Would check SPO2 and her heart rate has been up to 172. Oxygen level has stayed normal. Took antiacid medicines and the feeling never went away in her throat. Always felt like something was stuck in her throat. Last episode was 2 days ago. Lasted for 1 hour. Was sitting watching tv and starting feeling this way. Pulse was 168. )  Gastroesophageal Reflux She complains of choking, heartburn and a sore throat. She reports no chest pain or no nausea. This is a recurrent problem. The problem occurs frequently. The heartburn is located in the substernum. The heartburn is of moderate intensity. The symptoms are aggravated by lying down. There are no known risk factors.  Palpitations  This is a new problem. The current episode started more than 1 month ago. The problem occurs intermittently. Nothing aggravates the symptoms. Associated symptoms include chest fullness. Pertinent negatives include no chest pain, diaphoresis, dizziness, nausea, near-syncope, shortness of breath, syncope or vomiting. She has tried deep relaxation for the symptoms.    Lab Results  Component Value Date   CREATININE 1.43 (H) 06/28/2019   BUN 22 06/28/2019   NA 137 06/28/2019   K 5.0 06/28/2019   CL 104 06/28/2019   CO2 24 06/28/2019   Lab Results  Component Value Date   CHOL 327 (H) 09/13/2019   HDL 57 09/13/2019   LDLCALC UNABLE TO CALCULATE IF TRIGLYCERIDE OVER 400 mg/dL 97/94/8016   LDLDIRECT 181.1 (H) 09/13/2019   TRIG 412 (H) 09/13/2019   CHOLHDL 5.7 09/13/2019   Lab Results  Component Value Date   TSH 2.575 09/06/2019   Lab Results  Component Value Date   HGBA1C 6.7 (H) 09/13/2019   Lab Results  Component Value Date   WBC 8.0 12/27/2019   HGB 11.4 (L) 12/27/2019   HCT 33.3 (L) 12/27/2019   MCV 89.0  12/27/2019   PLT 336 12/27/2019   Lab Results  Component Value Date   ALT 11 09/09/2018   AST 16 09/09/2018   ALKPHOS 87 09/09/2018   BILITOT 0.7 09/09/2018     Review of Systems  Constitutional: Negative for diaphoresis.  HENT: Positive for sore throat.   Respiratory: Positive for choking. Negative for shortness of breath.   Cardiovascular: Positive for palpitations. Negative for chest pain, syncope and near-syncope.  Gastrointestinal: Positive for heartburn. Negative for nausea and vomiting.  Neurological: Negative for dizziness.    Patient Active Problem List   Diagnosis Date Noted  . Aortic atherosclerosis (HCC) 11/19/2019  . Hyperlipidemia 11/15/2019  . Other fatigue 09/07/2019  . Stage 3b chronic kidney disease (HCC) 12/23/2018  . Restless leg syndrome 12/23/2018  . Unruptured synovial cyst of popliteal space 12/23/2018  . Plantar fasciitis of left foot 12/23/2018  . Iron deficiency anemia 11/26/2018  . B12 deficiency 11/26/2018  . Hypogammaglobulinemia (HCC) 11/11/2018  . Anemia due to stage 3b chronic kidney disease (HCC) 11/11/2018  . Osteopenia determined by x-ray 02/16/2018  . Major depressive disorder with single episode, in partial remission (HCC) 08/04/2017  . Neck pain on right side 08/05/2016  . Tinnitus of right ear 08/05/2016  . Type II diabetes mellitus with complication (HCC) 08/03/2015  . Lymphedema 06/21/2015  . History of paroxysmal supraventricular tachycardia 06/21/2015  . PAD (peripheral artery disease) (HCC) 02/02/2015  . Hyperlipidemia  associated with type 2 diabetes mellitus ([place]) 09/01/2014  . Neuropathy 09/01/2014  . Phlebectasia 09/01/2014  . Essential (primary) hypertension 09/01/2014  . Spondylolisthesis at L4-L5 level 07/05/2014  . Arthritis, degenerative 01/31/2014    Allergies  Allergen Reactions  . Augmentin [Amoxicillin-Pot Clavulanate] Diarrhea  . Rosuvastatin Diarrhea  . Tetracycline     Other reaction(s): emesis  .  Cefaclor Rash  . Cephalexin Rash  . Sulfa Antibiotics Rash    Other reaction(s): emesis    Past Surgical History:  Procedure Laterality Date  . ANGIOPLASTY / STENTING FEMORAL Left 2014, 2013  . BRAIN MENINGIOMA EXCISION  1988  . BREAST BIOPSY Right 1989   benign  . CARPAL TUNNEL RELEASE Left 1980  . CATARACT EXTRACTION W/PHACO Right 09/02/2019   Procedure: CATARACT EXTRACTION PHACO AND INTRAOCULAR LENS PLACEMENT (IOC);  Surgeon: Eulogio Bear, MD;  Location: [facility] ORS;  Service: Ophthalmology;  Laterality: Right;  [place] 00:27.3 CDE 1.92 AP% Fluid Pack lot # [id number]  . CATARACT EXTRACTION W/PHACO Left 10/18/2019   Procedure: CATARACT EXTRACTION PHACO AND INTRAOCULAR LENS PLACEMENT (IOC) LEFT DIABETIC 2.55  00:28.5;  Surgeon: Eulogio Bear, MD;  Location: [place] [facility];  Service: Ophthalmology;  Laterality: Left;  Diabetic - oral meds  . CESAREAN SECTION    . CHOLECYSTECTOMY  1987  . COLONOSCOPY WITH PROPOFOL N/A 05/08/2017   Procedure: COLONOSCOPY WITH PROPOFOL;  Surgeon: Jonathon Bellows, MD;  Location: [facility];  Service: Gastroenterology;  Laterality: N/A;  . KNEE ARTHROPLASTY Right 2009  . LOWER EXTREMITY ANGIOGRAPHY Left 07/21/2018   Procedure: LOWER EXTREMITY ANGIOGRAPHY;  Surgeon: Katha Cabal, MD;  Location: [facility];  Service: Cardiovascular;  Laterality: Left;    Social History   Tobacco Use  . Smoking status: Former Smoker    Packs/day: 0.50    Years: 46.00    Pack years: 23.00    Types: Cigarettes    Quit date: 04/05/2013    Years since quitting: 6.7  . Smokeless tobacco: Never Used  Vaping Use  . Vaping Use: Never used  Substance Use Topics  . Alcohol use: Yes    Alcohol/week: 2.0 standard drinks    Types: 2 Standard drinks or equivalent per week    Comment: occasional  . Drug use: No     Medication list has been reviewed and updated.  Current Meds  Medication Sig  . aspirin 81 MG chewable tablet Chew 81 mg by mouth daily.    Marland Kitchen atorvastatin (LIPITOR) 40 MG tablet TAKE 1 TABLET DAILY (Patient taking differently: Take 40 mg by mouth daily.)  . Cholecalciferol (VITAMIN D) 50 MCG (2000 UT) CAPS Take 2 capsules by mouth daily.  . clopidogrel (PLAVIX) 75 MG tablet TAKE 1 TABLET DAILY (Patient taking differently: Take 75 mg by mouth daily.)  . Cyanocobalamin (VITAMIN B 12 PO) Take 1,000 mcg by mouth daily. 1000mg  daily  . fexofenadine (ALLEGRA) 180 MG tablet Take 180 mg by mouth daily.  Marland Kitchen lisinopril (ZESTRIL) 20 MG tablet TAKE 1 TABLET DAILY (REPLACES LISINOPRIL HCT) (Patient taking differently: Take 20 mg by mouth daily.)  . meclizine (ANTIVERT) 12.5 MG tablet Take 1 tablet (12.5 mg total) by mouth 2 (two) times daily as needed for dizziness.  . metFORMIN (GLUCOPHAGE) 500 MG tablet TAKE 1 TABLET TWICE A DAY (Patient taking differently: Take 500 mg by mouth 2 (two) times daily with a meal.)  . Multiple Vitamin tablet Take 1 tablet by mouth daily.   . sertraline (ZOLOFT) 50 MG tablet Take  1 tablet (50 mg total) by mouth daily.    PHQ 2/9 Scores 01/11/2020 10/20/2019 09/13/2019 12/23/2018  PHQ - 2 Score 0 0 0 0  PHQ- 9 Score 0 - 0 0    GAD 7 : Generalized Anxiety Score 01/11/2020 09/13/2019  Nervous, Anxious, on Edge 0 0  Control/stop worrying 0 0  Worry too much - different things 0 0  Trouble relaxing 0 0  Restless 0 0  Easily annoyed or irritable 0 0  Afraid - awful might happen 0 0  Total GAD 7 Score 0 0  Anxiety Difficulty Not difficult at all Not difficult at all    BP Readings from Last 3 Encounters:  01/11/20 128/76  12/28/19 (!) 147/84  12/28/19 (!) 154/63    Physical Exam Vitals and nursing note reviewed.  Constitutional:      General: She is not in acute distress.    Appearance: Normal appearance. She is well-developed.  HENT:     Head: Normocephalic and atraumatic.  Neck:     Vascular: No carotid bruit.  Cardiovascular:     Rate and Rhythm: Normal rate and regular rhythm.     Pulses: Normal  pulses.     Heart sounds: No murmur heard.   Pulmonary:     Effort: Pulmonary effort is normal. No respiratory distress.     Breath sounds: No wheezing or rhonchi.  Abdominal:     General: Abdomen is flat. There is no distension.     Palpations: Abdomen is soft.     Tenderness: There is no abdominal tenderness.     Hernia: No hernia is present.  Musculoskeletal:     Cervical back: Normal range of motion.     Right lower leg: No edema.     Left lower leg: No edema.  Lymphadenopathy:     Cervical: No cervical adenopathy.  Skin:    General: Skin is warm and dry.     Findings: No rash.  Neurological:     General: No focal deficit present.     Mental Status: She is alert and oriented to person, place, and time.  Psychiatric:        Mood and Affect: Mood and affect and mood normal.        Behavior: Behavior normal.     Wt Readings from Last 3 Encounters:  01/11/20 188 lb (85.3 kg)  12/28/19 189 lb 7.8 oz (86 kg)  11/16/19 183 lb (83 kg)    BP 128/76   Pulse 80   Ht 5\' 2"  (1.575 m)   Wt 188 lb (85.3 kg)   LMP  (LMP Unknown)   SpO2 99%   BMI 34.39 kg/m   Assessment and Plan: 1. Tachycardia Episodic with upper chest discomfort The episodes currently resolve on their own with no CP or SOB Will place ZIO patch monitor Recommend ED evaluation if tachycardia is prolonged   Partially dictated using Editor, commissioning. Any errors are unintentional.  Halina Maidens, MD [facility]  01/11/2020

## 2020-01-19 ENCOUNTER — Other Ambulatory Visit: Payer: Self-pay | Admitting: Internal Medicine

## 2020-01-19 DIAGNOSIS — E782 Mixed hyperlipidemia: Secondary | ICD-10-CM

## 2020-01-24 ENCOUNTER — Telehealth: Payer: Self-pay | Admitting: Internal Medicine

## 2020-01-24 NOTE — Telephone Encounter (Signed)
Scott, from [employer], called regarding the pts atorvastatin. He states that he is concerned about the pts allergies and is requesting to speak with PCP or nurse. Please advise.   Reference # [id number]     [facility] - [place], [place] - [address]  [address] [place] [address]  Phone: [phone] Fax: [phone]  Hours: Not open 24 hours

## 2020-01-25 ENCOUNTER — Encounter: Payer: Self-pay | Admitting: Internal Medicine

## 2020-01-25 NOTE — Telephone Encounter (Signed)
Called and confirmed patient is still taking atorvastatin from 2018. Patient has not called with any complaints about this medication.

## 2020-02-10 ENCOUNTER — Encounter: Payer: Self-pay | Admitting: Internal Medicine

## 2020-02-11 NOTE — Telephone Encounter (Signed)
Please review.  KP

## 2020-02-14 ENCOUNTER — Other Ambulatory Visit: Payer: Self-pay

## 2020-02-14 DIAGNOSIS — I471 Supraventricular tachycardia: Secondary | ICD-10-CM

## 2020-02-14 NOTE — Progress Notes (Signed)
car

## 2020-02-17 ENCOUNTER — Telehealth: Payer: Self-pay | Admitting: Hematology and Oncology

## 2020-02-17 NOTE — Telephone Encounter (Signed)
02/17/2020 Called pt about r/s appts on 6/6 & 6/7 due to the clinic being closed while provider is on PAL. New appts made for 6/15 & 6/16, pt confirmed this change  SRW

## 2020-03-08 ENCOUNTER — Encounter: Payer: Self-pay | Admitting: Internal Medicine

## 2020-03-14 ENCOUNTER — Ambulatory Visit (INDEPENDENT_AMBULATORY_CARE_PROVIDER_SITE_OTHER): Payer: Medicare HMO | Admitting: Internal Medicine

## 2020-03-14 ENCOUNTER — Ambulatory Visit
Admission: RE | Admit: 2020-03-14 | Discharge: 2020-03-14 | Disposition: A | Discharge status: Home / Self Care | Payer: Medicare HMO | Attending: Internal Medicine | Admitting: Internal Medicine

## 2020-03-14 ENCOUNTER — Encounter: Payer: Self-pay | Admitting: Internal Medicine

## 2020-03-14 ENCOUNTER — Other Ambulatory Visit: Payer: Self-pay

## 2020-03-14 ENCOUNTER — Ambulatory Visit
Admission: RE | Admit: 2020-03-14 | Discharge: 2020-03-14 | Disposition: A | Discharge status: Home / Self Care | Payer: Medicare HMO | Source: Ambulatory Visit | Attending: Internal Medicine | Admitting: Internal Medicine

## 2020-03-14 VITALS — BP 132/74 | HR 122 | Temp 98.3°F | Ht 62.0 in | Wt 188.0 lb

## 2020-03-14 DIAGNOSIS — J1282 Pneumonia due to coronavirus disease 2019: Secondary | ICD-10-CM | POA: Diagnosis present

## 2020-03-14 DIAGNOSIS — R112 Nausea with vomiting, unspecified: Secondary | ICD-10-CM | POA: Diagnosis not present

## 2020-03-14 DIAGNOSIS — U071 COVID-19: Secondary | ICD-10-CM | POA: Diagnosis present

## 2020-03-14 MED ORDER — GUAIFENESIN-CODEINE 100-10 MG/5ML PO SYRP: 5.0000 mL | ORAL_SOLUTION | Freq: Three times a day (TID) | ORAL | 0 refills | Status: DC | PRN

## 2020-03-14 MED ORDER — AZITHROMYCIN 250 MG PO TABS
ORAL_TABLET | ORAL | 0 refills | Status: AC
Start: 2020-03-14 — End: 2020-03-19

## 2020-03-14 MED ORDER — ONDANSETRON HCL 4 MG PO TABS: 4.0000 mg | ORAL_TABLET | Freq: Three times a day (TID) | ORAL | 0 refills | Status: DC | PRN

## 2020-03-14 NOTE — Progress Notes (Signed)
Date:  03/14/2020   Name:  Misty Villarreal   DOB:  [birth year]   MRN:  [id number]   Chief Complaint: Covid Positive (Fever on and off for 8 days now. Tylenol not helping much. Cough. No SOB. Blood sugar been elevated at 240.)  Cough This is a new problem. The current episode started in the past 7 days. The problem occurs every few minutes. The cough is non-productive. Associated symptoms include chills, a fever, headaches and shortness of breath. Pertinent negatives include no chest pain, postnasal drip or wheezing. The symptoms are aggravated by lying down. Risk factors: tested positive for Covid 8 days ago. She has tried OTC cough suppressant for the symptoms. The treatment provided mild relief.    Lab Results  Component Value Date   CREATININE 1.43 (H) 06/28/2019   BUN 22 06/28/2019   NA 137 06/28/2019   K 5.0 06/28/2019   CL 104 06/28/2019   CO2 24 06/28/2019   Lab Results  Component Value Date   CHOL 327 (H) 09/13/2019   HDL 57 09/13/2019   LDLCALC UNABLE TO CALCULATE IF TRIGLYCERIDE OVER 400 mg/dL 09/13/2019   LDLDIRECT 181.1 (H) 09/13/2019   TRIG 412 (H) 09/13/2019   CHOLHDL 5.7 09/13/2019   Lab Results  Component Value Date   TSH 2.575 09/06/2019   Lab Results  Component Value Date   HGBA1C 6.7 (H) 09/13/2019   Lab Results  Component Value Date   WBC 8.0 12/27/2019   HGB 11.4 (L) 12/27/2019   HCT 33.3 (L) 12/27/2019   MCV 89.0 12/27/2019   PLT 336 12/27/2019   Lab Results  Component Value Date   ALT 11 09/09/2018   AST 16 09/09/2018   ALKPHOS 87 09/09/2018   BILITOT 0.7 09/09/2018     Review of Systems  Constitutional: Positive for chills, fatigue and fever.  HENT: Positive for congestion. Negative for postnasal drip.   Respiratory: Positive for cough and shortness of breath. Negative for chest tightness and wheezing.   Cardiovascular: Negative for chest pain and palpitations.  Gastrointestinal: Positive for diarrhea, nausea and vomiting.   Musculoskeletal: Negative for neck stiffness.  Neurological: Positive for headaches. Negative for dizziness and light-headedness.  Psychiatric/Behavioral: Positive for sleep disturbance. Negative for dysphoric mood. The patient is not nervous/anxious.     Patient Active Problem List   Diagnosis Date Noted  . Aortic atherosclerosis ([place]) 11/19/2019  . Hyperlipidemia 11/15/2019  . Other fatigue 09/07/2019  . Stage 3b chronic kidney disease ([place]) 12/23/2018  . Restless leg syndrome 12/23/2018  . Unruptured synovial cyst of popliteal space 12/23/2018  . Plantar fasciitis of left foot 12/23/2018  . Iron deficiency anemia 11/26/2018  . B12 deficiency 11/26/2018  . Hypogammaglobulinemia ([place]) 11/11/2018  . Anemia due to stage 3b chronic kidney disease ([place]) 11/11/2018  . Osteopenia determined by x-ray 02/16/2018  . Major depressive disorder with single episode, in partial remission ([place]) 08/04/2017  . Neck pain on right side 08/05/2016  . Tinnitus of right ear 08/05/2016  . Type II diabetes mellitus with complication ([place]) 65/68/1275  . Lymphedema 06/21/2015  . History of paroxysmal supraventricular tachycardia 06/21/2015  . PAD (peripheral artery disease) ([place]) 02/02/2015  . Hyperlipidemia associated with type 2 diabetes mellitus ([place]) 09/01/2014  . Neuropathy 09/01/2014  . Phlebectasia 09/01/2014  . Essential (primary) hypertension 09/01/2014  . Spondylolisthesis at L4-L5 level 07/05/2014  . Arthritis, degenerative 01/31/2014    Allergies  Allergen Reactions  . Augmentin [Amoxicillin-Pot Clavulanate] Diarrhea  . Tetracycline  Other reaction(s): emesis  . Cefaclor Rash  . Cephalexin Rash  . Rosuvastatin Diarrhea  . Sulfa Antibiotics Rash    Other reaction(s): emesis    Past Surgical History:  Procedure Laterality Date  . ANGIOPLASTY / STENTING FEMORAL Left 2014, 2013  . BRAIN MENINGIOMA EXCISION  1988  . BREAST BIOPSY Right 1989   benign  . CARPAL TUNNEL RELEASE  Left 1980  . CATARACT EXTRACTION W/PHACO Right 09/02/2019   Procedure: CATARACT EXTRACTION PHACO AND INTRAOCULAR LENS PLACEMENT (IOC);  Surgeon: Eulogio Bear, MD;  Location: [facility] ORS;  Service: Ophthalmology;  Laterality: Right;  [place] 00:27.3 CDE 1.92 AP% Fluid Pack lot # [id number]  . CATARACT EXTRACTION W/PHACO Left 10/18/2019   Procedure: CATARACT EXTRACTION PHACO AND INTRAOCULAR LENS PLACEMENT (IOC) LEFT DIABETIC 2.55  00:28.5;  Surgeon: Eulogio Bear, MD;  Location: [facility];  Service: Ophthalmology;  Laterality: Left;  Diabetic - oral meds  . CESAREAN SECTION    . CHOLECYSTECTOMY  1987  . COLONOSCOPY WITH PROPOFOL N/A 05/08/2017   Procedure: COLONOSCOPY WITH PROPOFOL;  Surgeon: Jonathon Bellows, MD;  Location: [facility] ENDOSCOPY;  Service: Gastroenterology;  Laterality: N/A;  . KNEE ARTHROPLASTY Right 2009  . LOWER EXTREMITY ANGIOGRAPHY Left 07/21/2018   Procedure: LOWER EXTREMITY ANGIOGRAPHY;  Surgeon: Katha Cabal, MD;  Location: [facility];  Service: Cardiovascular;  Laterality: Left;    Social History   Tobacco Use  . Smoking status: Former Smoker    Packs/day: 0.50    Years: 46.00    Pack years: 23.00    Types: Cigarettes    Quit date: 04/05/2013    Years since quitting: 6.9  . Smokeless tobacco: Never Used  Vaping Use  . Vaping Use: Never used  Substance Use Topics  . Alcohol use: Yes    Alcohol/week: 2.0 standard drinks    Types: 2 Standard drinks or equivalent per week    Comment: occasional  . Drug use: No     Medication list has been reviewed and updated.  Current Meds  Medication Sig  . aspirin 81 MG chewable tablet Chew 81 mg by mouth daily.   Marland Kitchen atorvastatin (LIPITOR) 40 MG tablet TAKE 1 TABLET DAILY  . Cholecalciferol (VITAMIN D) 50 MCG (2000 UT) CAPS Take 2 capsules by mouth daily.  . clopidogrel (PLAVIX) 75 MG tablet TAKE 1 TABLET DAILY (Patient taking differently: Take 75 mg by mouth daily.)  . Cyanocobalamin (VITAMIN B 12 PO)  Take 1,000 mcg by mouth daily. 1000mg  daily  . fexofenadine (ALLEGRA) 180 MG tablet Take 180 mg by mouth daily.  Marland Kitchen lisinopril (ZESTRIL) 20 MG tablet TAKE 1 TABLET DAILY (REPLACES LISINOPRIL HCT) (Patient taking differently: Take 20 mg by mouth daily.)  . meclizine (ANTIVERT) 12.5 MG tablet Take 1 tablet (12.5 mg total) by mouth 2 (two) times daily as needed for dizziness.  . metFORMIN (GLUCOPHAGE) 500 MG tablet TAKE 1 TABLET TWICE A DAY (Patient taking differently: Take 500 mg by mouth 2 (two) times daily with a meal.)  . Multiple Vitamin tablet Take 1 tablet by mouth daily.   . sertraline (ZOLOFT) 50 MG tablet Take 1 tablet (50 mg total) by mouth daily.    PHQ 2/9 Scores 03/14/2020 01/11/2020 10/20/2019 09/13/2019  PHQ - 2 Score 0 0 0 0  PHQ- 9 Score 0 0 - 0    GAD 7 : Generalized Anxiety Score 03/14/2020 01/11/2020 09/13/2019  Nervous, Anxious, on Edge 0 0 0  Control/stop worrying 0 0 0  Worry  too much - different things 0 0 0  Trouble relaxing 0 0 0  Restless 0 0 0  Easily annoyed or irritable 0 0 0  Afraid - awful might happen 0 0 0  Total GAD 7 Score 0 0 0  Anxiety Difficulty Not difficult at all Not difficult at all Not difficult at all    BP Readings from Last 3 Encounters:  03/14/20 132/74  01/11/20 128/76  12/28/19 (!) 147/84    Physical Exam Constitutional:      Appearance: She is ill-appearing.  Cardiovascular:     Rate and Rhythm: Regular rhythm. Tachycardia present.     Heart sounds: No murmur heard.   Pulmonary:     Effort: Pulmonary effort is normal.     Breath sounds: Examination of the right-lower field reveals rhonchi. Examination of the left-lower field reveals rhonchi. Rhonchi present. No wheezing.  Abdominal:     Palpations: Abdomen is soft.     Tenderness: There is no abdominal tenderness.  Musculoskeletal:     Cervical back: Normal range of motion.     Right lower leg: No edema.     Left lower leg: No edema.  Lymphadenopathy:     Cervical: No  cervical adenopathy.  Neurological:     General: No focal deficit present.     Mental Status: She is alert.  Psychiatric:        Attention and Perception: Attention normal.        Mood and Affect: Mood normal.     Wt Readings from Last 3 Encounters:  03/14/20 188 lb (85.3 kg)  01/11/20 188 lb (85.3 kg)  12/28/19 189 lb 7.8 oz (86 kg)    BP 132/74   Pulse (!) 122   Temp 98.3 F (36.8 C) (Oral)   Ht 5\' 2"  (1.575 m)   Wt 188 lb (85.3 kg)   LMP  (LMP Unknown)   SpO2 98%   BMI 34.39 kg/m   Assessment and Plan: 1. Pneumonia due to COVID-19 virus Will get CXR and begin treatment with antibiotics, cough syrup.  Increase fluid intake to include electrolytes - DG Chest 2 View; Future - azithromycin (ZITHROMAX Z-PAK) 250 MG tablet; UAD  Dispense: 6 tablet; Refill: 0 - guaiFENesin-codeine (ROBITUSSIN AC) 100-10 MG/5ML syrup; Take 5 mLs by mouth 3 (three) times daily as needed for cough.  Dispense: 118 mL; Refill: 0  2. Non-intractable vomiting with nausea, unspecified vomiting type - ondansetron (ZOFRAN) 4 MG tablet; Take 1 tablet (4 mg total) by mouth every 8 (eight) hours as needed for nausea or vomiting.  Dispense: 20 tablet; Refill: 0   Partially dictated using Editor, commissioning. Any errors are unintentional.  Halina Maidens, MD [facility]  03/14/2020

## 2020-03-27 ENCOUNTER — Inpatient Hospital Stay: Payer: Medicare HMO | Attending: Hematology and Oncology

## 2020-03-27 ENCOUNTER — Other Ambulatory Visit: Payer: Self-pay

## 2020-03-27 DIAGNOSIS — D509 Iron deficiency anemia, unspecified: Secondary | ICD-10-CM | POA: Insufficient documentation

## 2020-03-27 DIAGNOSIS — E538 Deficiency of other specified B group vitamins: Secondary | ICD-10-CM

## 2020-03-27 LAB — CBC WITH DIFFERENTIAL/PLATELET
Abs Immature Granulocytes: 0.05 10*3/uL (ref 0.00–0.07)
Basophils Absolute: 0.1 10*3/uL (ref 0.0–0.1)
Basophils Relative: 1 %
Eosinophils Absolute: 0.2 10*3/uL (ref 0.0–0.5)
Eosinophils Relative: 2 %
HCT: 32.3 % — ABNORMAL LOW (ref 36.0–46.0)
Hemoglobin: 10.9 g/dL — ABNORMAL LOW (ref 12.0–15.0)
Immature Granulocytes: 1 %
Lymphocytes Relative: 21 %
Lymphs Abs: 2 10*3/uL (ref 0.7–4.0)
MCH: 29.8 pg (ref 26.0–34.0)
MCHC: 33.7 g/dL (ref 30.0–36.0)
MCV: 88.3 fL (ref 80.0–100.0)
Monocytes Absolute: 0.8 10*3/uL (ref 0.1–1.0)
Monocytes Relative: 9 %
Neutro Abs: 6.4 10*3/uL (ref 1.7–7.7)
Neutrophils Relative %: 66 %
Platelets: 261 10*3/uL (ref 150–400)
RBC: 3.66 MIL/uL — ABNORMAL LOW (ref 3.87–5.11)
RDW: 12.5 % (ref 11.5–15.5)
WBC: 9.5 10*3/uL (ref 4.0–10.5)
nRBC: 0 % (ref 0.0–0.2)

## 2020-03-27 LAB — FERRITIN: Ferritin: 62 ng/mL (ref 11–307)

## 2020-03-27 LAB — IRON AND TIBC
Iron: 51 ug/dL (ref 28–170)
Saturation Ratios: 15 % (ref 10.4–31.8)
TIBC: 330 ug/dL (ref 250–450)
UIBC: 279 ug/dL

## 2020-03-29 DIAGNOSIS — I208 Other forms of angina pectoris: Secondary | ICD-10-CM | POA: Insufficient documentation

## 2020-03-29 DIAGNOSIS — I2089 Other forms of angina pectoris: Secondary | ICD-10-CM | POA: Insufficient documentation

## 2020-03-29 DIAGNOSIS — R002 Palpitations: Secondary | ICD-10-CM | POA: Insufficient documentation

## 2020-04-13 ENCOUNTER — Other Ambulatory Visit: Payer: Self-pay | Admitting: Internal Medicine

## 2020-04-13 ENCOUNTER — Telehealth: Payer: Self-pay | Admitting: Internal Medicine

## 2020-04-13 ENCOUNTER — Telehealth: Payer: Self-pay

## 2020-04-13 DIAGNOSIS — I10 Essential (primary) hypertension: Secondary | ICD-10-CM

## 2020-04-13 DIAGNOSIS — E782 Mixed hyperlipidemia: Secondary | ICD-10-CM

## 2020-04-13 NOTE — Telephone Encounter (Signed)
metFORMIN (GLUCOPHAGE) 500 MG tablet 180 tablet 3 09/19/2018    Sig: TAKE 1 TABLET TWICE A DAY   Patient taking differently: Take 500 mg by mouth 2 (two) times daily with a meal.       Sent to pharmacy as: metFORMIN (GLUCOPHAGE) 500 MG tablet   E-Prescribing Status: Receipt confirmed by pharmacy (09/19/2018 6:52 PM EDT)    Pt states forgot out of Metformin and [facility] ask her if she could get a 10 day supply sent to [place] Phone:  [phone]  Fax:  [phone]

## 2020-04-13 NOTE — Telephone Encounter (Signed)
Copied from [place] [id number]. Topic: General - Other >> Apr 13, 2020  4:09 PM Keene Breath wrote: Reason for CRM: Patient is call the nurse back.  She stated that she was talking to her and was disconnected.  When trying the office, was told that the nurse had left for the day.  Please return the call to patient at [phone]

## 2020-04-14 ENCOUNTER — Other Ambulatory Visit: Payer: Self-pay

## 2020-04-14 MED ORDER — METFORMIN HCL 500 MG PO TABS: 500.0000 mg | ORAL_TABLET | Freq: Two times a day (BID) | ORAL | 0 refills | Status: DC

## 2020-04-14 NOTE — Telephone Encounter (Signed)
Sent in Patient informed 

## 2020-04-14 NOTE — Telephone Encounter (Signed)
Spoke with patient. She said she is okay now. Told her 3 Rx's have been sent to her mail order for 90 days. She thanked me and we ended the call.

## 2020-04-20 ENCOUNTER — Other Ambulatory Visit: Payer: Self-pay | Admitting: Internal Medicine

## 2020-04-20 DIAGNOSIS — E118 Type 2 diabetes mellitus with unspecified complications: Secondary | ICD-10-CM

## 2020-04-20 MED ORDER — METFORMIN HCL 500 MG PO TABS: 500.0000 mg | ORAL_TABLET | Freq: Two times a day (BID) | ORAL | 0 refills | Status: DC

## 2020-05-15 ENCOUNTER — Ambulatory Visit (INDEPENDENT_AMBULATORY_CARE_PROVIDER_SITE_OTHER): Payer: Medicare HMO | Admitting: Vascular Surgery

## 2020-05-15 ENCOUNTER — Other Ambulatory Visit: Payer: Self-pay

## 2020-05-15 ENCOUNTER — Encounter (INDEPENDENT_AMBULATORY_CARE_PROVIDER_SITE_OTHER): Payer: Self-pay | Admitting: Vascular Surgery

## 2020-05-15 ENCOUNTER — Ambulatory Visit (INDEPENDENT_AMBULATORY_CARE_PROVIDER_SITE_OTHER): Payer: Medicare HMO

## 2020-05-15 VITALS — BP 151/70 | HR 59 | Ht 62.0 in | Wt 189.0 lb

## 2020-05-15 DIAGNOSIS — I739 Peripheral vascular disease, unspecified: Secondary | ICD-10-CM

## 2020-05-15 DIAGNOSIS — I10 Essential (primary) hypertension: Secondary | ICD-10-CM

## 2020-05-15 DIAGNOSIS — E118 Type 2 diabetes mellitus with unspecified complications: Secondary | ICD-10-CM

## 2020-05-15 DIAGNOSIS — I890 Lymphedema, not elsewhere classified: Secondary | ICD-10-CM

## 2020-05-15 DIAGNOSIS — I70213 Atherosclerosis of native arteries of extremities with intermittent claudication, bilateral legs: Secondary | ICD-10-CM

## 2020-05-15 DIAGNOSIS — I70219 Atherosclerosis of native arteries of extremities with intermittent claudication, unspecified extremity: Secondary | ICD-10-CM | POA: Insufficient documentation

## 2020-05-15 DIAGNOSIS — I208 Other forms of angina pectoris: Secondary | ICD-10-CM

## 2020-05-15 NOTE — Progress Notes (Signed)
MRN : [id number]  Misty Villarreal is a 70 y.o. ([birth year]) female who presents with chief complaint of  Chief Complaint  Patient presents with  . Follow-up    6 Mo U/S  .  History of Present Illness:   The patient returns to the office for followup and review status post angiogram with interventionon 07/21/2018.  Procedure(s) Performed: 1. Introduction catheter intoleftlower extremity 3rd order catheter placement  2.Contrast injection leftlower extremity for distal runoff  3. Crosser atherectomy of theleftSFA and popliteal arteries 4. Percutaneous transluminal angioplastyleftsuperficial femoral artery and popliteal 5. Star close closurerightcommon femoral arteriotomy  The patient notes her lower extremity symptoms are stable.  She recently went to a concert and was able to walk to the venue without problem, she notes she did have some problems walking back but it was still OK.  No interval shortening of the patient's claudication distance or rest pain symptoms. No new ulcers or wounds have occurred since the last visit.    There have been no significant changes to the patient's overall health care.  The patient denies amaurosis fugax or recent TIA symptoms. There are no recent neurological changes noted. The patient denies history of DVT, PE or superficial thrombophlebitis. The patient denies recent episodes of angina or shortness of breath.   ABI's Rt=0.94and Lt=0.76(previous ABI's Rt=1.04and Lt=0.82).  Previous duplex ultrasound of the arterial system bilaterally shows the SFA intervention siteon the right ispatentand the left is occluded  Current Meds  Medication Sig  . aspirin 81 MG chewable tablet Chew 81 mg by mouth daily.   Marland Kitchen atorvastatin (LIPITOR) 40 MG tablet TAKE 1 TABLET DAILY  . Cholecalciferol (VITAMIN D) 50 MCG (2000 UT) CAPS Take 2 capsules by mouth daily.  . clopidogrel  (PLAVIX) 75 MG tablet TAKE 1 TABLET DAILY (Patient taking differently: Take 75 mg by mouth daily.)  . Cyanocobalamin (VITAMIN B 12 PO) Take 1,000 mcg by mouth daily. 1000mg  daily  . fexofenadine (ALLEGRA) 180 MG tablet Take 180 mg by mouth daily.  Marland Kitchen guaiFENesin-codeine (ROBITUSSIN AC) 100-10 MG/5ML syrup Take 5 mLs by mouth 3 (three) times daily as needed for cough.  Marland Kitchen lisinopril (ZESTRIL) 20 MG tablet TAKE 1 TABLET DAILY (REPLACES LISINOPRIL HCT)  . meclizine (ANTIVERT) 12.5 MG tablet Take 1 tablet (12.5 mg total) by mouth 2 (two) times daily as needed for dizziness.  . metFORMIN (GLUCOPHAGE) 500 MG tablet Take 1 tablet (500 mg total) by mouth 2 (two) times daily with a meal.  . metoprolol tartrate (LOPRESSOR) 25 MG tablet Take 25 mg by mouth 2 (two) times daily.  . Multiple Vitamin tablet Take 1 tablet by mouth daily.   . ondansetron (ZOFRAN) 4 MG tablet Take 1 tablet (4 mg total) by mouth every 8 (eight) hours as needed for nausea or vomiting.  . sertraline (ZOLOFT) 50 MG tablet Take 1 tablet (50 mg total) by mouth daily.    Past Medical History:  Diagnosis Date  . Abnormal glandular Papanicolaou smear of cervix 09/01/2014   Normal Pap 2012   . Anemia   . Chronic kidney disease    stage 3  . Depression   . Diabetes mellitus without complication ([place])   . DVT (deep venous thrombosis) ([place])   . Hyperlipidemia   . Hypertension   . Peripheral vascular disease ([place])   . Peripheral vascular disease ([place])   . Slurred speech 12/03/2016    Past Surgical History:  Procedure Laterality Date  . ANGIOPLASTY / STENTING FEMORAL Left  2014, 2013  . BRAIN MENINGIOMA EXCISION  1988  . BREAST BIOPSY Right 1989   benign  . CARPAL TUNNEL RELEASE Left 1980  . CATARACT EXTRACTION W/PHACO Right 09/02/2019   Procedure: CATARACT EXTRACTION PHACO AND INTRAOCULAR LENS PLACEMENT (IOC);  Surgeon: Eulogio Bear, MD;  Location: [facility] ORS;  Service: Ophthalmology;  Laterality: Right;  [place] 00:27.3 CDE  1.92 AP% Fluid Pack lot # [id number]  . CATARACT EXTRACTION W/PHACO Left 10/18/2019   Procedure: CATARACT EXTRACTION PHACO AND INTRAOCULAR LENS PLACEMENT (IOC) LEFT DIABETIC 2.55  00:28.5;  Surgeon: Eulogio Bear, MD;  Location: [place];  Service: Ophthalmology;  Laterality: Left;  Diabetic - oral meds  . CESAREAN SECTION    . CHOLECYSTECTOMY  1987  . COLONOSCOPY WITH PROPOFOL N/A 05/08/2017   Procedure: COLONOSCOPY WITH PROPOFOL;  Surgeon: Jonathon Bellows, MD;  Location: [facility];  Service: Gastroenterology;  Laterality: N/A;  . KNEE ARTHROPLASTY Right 2009  . LOWER EXTREMITY ANGIOGRAPHY Left 07/21/2018   Procedure: LOWER EXTREMITY ANGIOGRAPHY;  Surgeon: Katha Cabal, MD;  Location: [place] CV LAB;  Service: Cardiovascular;  Laterality: Left;    Social History Social History   Tobacco Use  . Smoking status: Former Smoker    Packs/day: 0.50    Years: 46.00    Pack years: 23.00    Types: Cigarettes    Quit date: 04/05/2013    Years since quitting: 7.1  . Smokeless tobacco: Never Used  Vaping Use  . Vaping Use: Never used  Substance Use Topics  . Alcohol use: Yes    Alcohol/week: 2.0 standard drinks    Types: 2 Standard drinks or equivalent per week    Comment: occasional  . Drug use: No    Family History Family History  Problem Relation Age of Onset  . Diabetes Mother   . Heart failure Father   . Diabetes Father   . Breast cancer Neg Hx     Allergies  Allergen Reactions  . Augmentin [Amoxicillin-Pot Clavulanate] Diarrhea  . Tetracycline     Other reaction(s): emesis  . Cefaclor Rash  . Cephalexin Rash  . Rosuvastatin Diarrhea  . Sulfa Antibiotics Rash    Other reaction(s): emesis     REVIEW OF SYSTEMS (Negative unless checked)  Constitutional: [] Weight loss  [] Fever  [] Chills Cardiac: [] Chest pain   [] Chest pressure   [] Palpitations   [] Shortness of breath when laying flat   [] Shortness of breath with exertion. Vascular:  [] Pain in  legs with walking   [] Pain in legs at rest  [] History of DVT   [] Phlebitis   [] Swelling in legs   [] Varicose veins   [] Non-healing ulcers Pulmonary:   [] Uses home oxygen   [] Productive cough   [] Hemoptysis   [] Wheeze  [] COPD   [] Asthma Neurologic:  [] Dizziness   [] Seizures   [] History of stroke   [] History of TIA  [] Aphasia   [] Vissual changes   [] Weakness or numbness in arm   [] Weakness or numbness in leg Musculoskeletal:   [] Joint swelling   [] Joint pain   [] Low back pain Hematologic:  [] Easy bruising  [] Easy bleeding   [] Hypercoagulable state   [] Anemic Gastrointestinal:  [] Diarrhea   [] Vomiting  [] Gastroesophageal reflux/heartburn   [] Difficulty swallowing. Genitourinary:  [] Chronic kidney disease   [] Difficult urination  [] Frequent urination   [] Blood in urine Skin:  [] Rashes   [] Ulcers  Psychological:  [] History of anxiety   []  History of major depression.  Physical Examination  Vitals:   05/15/20 1132  BP: Marland Kitchen)  151/70  Pulse: (!) 59  Weight: 189 lb (85.7 kg)  Height: 5\' 2"  (1.575 m)   Body mass index is 34.57 kg/m. Gen: WD/WN, NAD Head: [place]/AT, No temporalis wasting.  Ear/Nose/Throat: Hearing grossly intact, nares w/o erythema or drainage Eyes: PER, EOMI, sclera nonicteric.  Neck: Supple, no large masses.   Pulmonary:  Good air movement, no audible wheezing bilaterally, no use of accessory muscles.  Cardiac: RRR, no JVD Vascular: scattered varicosities present bilaterally.  Mild venous stasis changes to the legs bilaterally.  2+ soft pitting edema. Vessel Right Left  Radial Palpable Palpable  PT Trace Palpable Not Palpable  DP Trace Palpable Not Palpable  Gastrointestinal: Non-distended. No guarding/no peritoneal signs.  Musculoskeletal: M/S 5/5 throughout.  No deformity or atrophy.  Neurologic: CN 2-12 intact. Symmetrical.  Speech is fluent. Motor exam as listed above. Psychiatric: Judgment intact, Mood & affect appropriate for pt's clinical situation. Dermatologic: No  rashes or ulcers noted.  No changes consistent with cellulitis.   CBC Lab Results  Component Value Date   WBC 9.5 03/27/2020   HGB 10.9 (L) 03/27/2020   HCT 32.3 (L) 03/27/2020   MCV 88.3 03/27/2020   PLT 261 03/27/2020    BMET    Component Value Date/Time   NA 137 06/28/2019 1041   NA 138 12/23/2018 1350   NA 139 04/27/2013 0713   K 5.0 06/28/2019 1041   K 4.2 04/27/2013 0713   CL 104 06/28/2019 1041   CL 108 (H) 04/27/2013 0713   CO2 24 06/28/2019 1041   CO2 25 04/27/2013 0713   GLUCOSE 155 (H) 06/28/2019 1041   GLUCOSE 137 (H) 04/27/2013 0713   BUN 22 06/28/2019 1041   BUN 40 (H) 12/23/2018 1350   BUN 21 (H) 04/27/2013 0713   CREATININE 1.43 (H) 06/28/2019 1041   CREATININE 1.28 04/27/2013 0713   CALCIUM 8.9 06/28/2019 1041   CALCIUM 9.3 04/27/2013 0713   GFRNONAA 37 (L) 06/28/2019 1041   GFRNONAA 44 (L) 04/27/2013 0713   GFRAA 43 (L) 06/28/2019 1041   GFRAA 52 (L) 04/27/2013 0713   CrCl cannot be calculated (Patient's most recent lab result is older than the maximum 21 days allowed.).  COAG Lab Results  Component Value Date   INR 0.85 12/03/2016    Radiology No results found.   Assessment/Plan 1. Atherosclerosis of native artery of both lower extremities with intermittent claudication (HCC)  Recommend:  The patient has evidence of atherosclerosis of the lower extremities with claudication.  The patient does not voice lifestyle limiting changes at this point in time.  Noninvasive studies do not suggest clinically significant change.  No invasive studies, angiography or surgery at this time The patient should continue walking and begin a more formal exercise program.  The patient should continue antiplatelet therapy and aggressive treatment of the lipid abnormalities  No changes in the patient's medications at this time  The patient should continue wearing graduated compression socks 10-15 mmHg strength to control the mild edema.   - VAS [place] ABI  WITH/WO TBI; Future  2. Lymphedema No surgery or intervention at this point in time.  I have reviewed my discussion with the patient regarding venous insufficiency and why it causes symptoms. I have discussed with the patient the chronic skin changes that accompany venous insufficiency and the long term sequela such as ulceration. Patient will contnue wearing graduated compression stockings on a daily basis, as this has provided excellent control of his edema. The patient will put the stockings  on first thing in the morning and removing them in the evening. The patient is reminded not to sleep in the stockings.  In addition, behavioral modification including elevation during the day will be initiated. Exercise is strongly encouraged.  Given the patient's good control and lack of any problems regarding the venous insufficiency and lymphedema a lymph pump in not need at this time.    3. Essential (primary) hypertension Continue antihypertensive medications as already ordered, these medications have been reviewed and there are no changes at this time.   4. Stable angina pectoris ([place]) Continue cardiac and antihypertensive medications as already ordered and reviewed, no changes at this time.  Continue statin as ordered and reviewed, no changes at this time  Nitrates PRN for chest pain   5. Type II diabetes mellitus with complication (HCC) Continue hypoglycemic medications as already ordered, these medications have been reviewed and there are no changes at this time.  Hgb A1C to be monitored as already arranged by primary service    Hortencia Pilar, MD  05/15/2020 11:37 AM

## 2020-06-26 ENCOUNTER — Other Ambulatory Visit: Payer: Medicare HMO

## 2020-06-27 ENCOUNTER — Ambulatory Visit: Payer: Medicare HMO

## 2020-07-05 ENCOUNTER — Other Ambulatory Visit: Payer: Medicare HMO

## 2020-07-06 ENCOUNTER — Ambulatory Visit: Payer: Medicare HMO

## 2020-07-06 ENCOUNTER — Ambulatory Visit: Payer: Medicare HMO | Admitting: Oncology

## 2020-07-06 ENCOUNTER — Other Ambulatory Visit: Payer: Medicare HMO

## 2020-07-13 ENCOUNTER — Telehealth: Payer: Self-pay

## 2020-07-13 NOTE — Telephone Encounter (Signed)
Call pt to schedule an appt.  KP

## 2020-07-13 NOTE — Telephone Encounter (Signed)
Copied from [place] [id number]. Topic: General - Inquiry >> Jul 12, 2020  4:43 PM Loma [place] wrote: Reason for CRM: Pt called before Epic went down, lost info relayed,  recalling pt was in UC aprox on the 14th and given 5 day Z pac, got better, now excessive congestion, thick yellow mucus, coughing, worse everyday since Sunday, called office, told to send a CRM with details FU for appt in office with Army Melia if possible  [phone]

## 2020-07-14 ENCOUNTER — Ambulatory Visit
Admission: RE | Admit: 2020-07-14 | Discharge: 2020-07-14 | Disposition: A | Discharge status: Home / Self Care | Payer: Medicare HMO | Source: Ambulatory Visit | Attending: Internal Medicine | Admitting: Internal Medicine

## 2020-07-14 ENCOUNTER — Encounter: Payer: Self-pay | Admitting: Internal Medicine

## 2020-07-14 ENCOUNTER — Ambulatory Visit: Payer: Medicare HMO | Admitting: Internal Medicine

## 2020-07-14 ENCOUNTER — Ambulatory Visit
Admission: RE | Admit: 2020-07-14 | Discharge: 2020-07-14 | Disposition: A | Discharge status: Home / Self Care | Payer: Medicare HMO | Attending: Internal Medicine | Admitting: Internal Medicine

## 2020-07-14 ENCOUNTER — Other Ambulatory Visit: Payer: Self-pay

## 2020-07-14 VITALS — BP 132/82 | HR 66 | Temp 98.0°F | Ht 62.0 in | Wt 182.0 lb

## 2020-07-14 DIAGNOSIS — J189 Pneumonia, unspecified organism: Secondary | ICD-10-CM | POA: Diagnosis present

## 2020-07-14 DIAGNOSIS — E1169 Type 2 diabetes mellitus with other specified complication: Secondary | ICD-10-CM | POA: Diagnosis not present

## 2020-07-14 DIAGNOSIS — E118 Type 2 diabetes mellitus with unspecified complications: Secondary | ICD-10-CM | POA: Diagnosis not present

## 2020-07-14 DIAGNOSIS — E785 Hyperlipidemia, unspecified: Secondary | ICD-10-CM

## 2020-07-14 DIAGNOSIS — I10 Essential (primary) hypertension: Secondary | ICD-10-CM | POA: Diagnosis not present

## 2020-07-14 MED ORDER — LEVOFLOXACIN 750 MG PO TABS: 750.0000 mg | ORAL_TABLET | Freq: Every day | ORAL | 0 refills | Status: AC

## 2020-07-14 NOTE — Progress Notes (Signed)
Date:  07/14/2020   Name:  Misty Villarreal   DOB:  October 17, 1950   MRN:  [id number]   Chief Complaint: Cough (Yellow thick mucous, zpak didn't help, no appetite, no energy, cov[facility] on [facility] at [facility]), Diabetes, Hypertension, and Hyperlipidemia  Cough This is a new problem. Episode onset: 10 days ago went to UC and given Zpak. The problem has been gradually worsening. The cough is Productive of sputum. Associated symptoms include nasal congestion, postnasal drip, shortness of breath and wheezing. Pertinent negatives include no chills, fever or headaches. The symptoms are aggravated by exercise. Treatments tried: zpak and cough syrup.  Diabetes She presents for her follow-up diabetic visit. Her disease course has been stable. Pertinent negatives for hypoglycemia include no dizziness, headaches or nervousness/anxiousness. Associated symptoms include fatigue. Current diabetic treatment includes oral agent (monotherapy). She is compliant with treatment all of the time.  Hypertension This is a chronic problem. The problem is controlled. Associated symptoms include shortness of breath. Pertinent negatives include no headaches. Past treatments include ACE inhibitors and beta blockers. The current treatment provides significant improvement.  Hyperlipidemia This is a chronic problem. The problem is uncontrolled (recommended Crestor in August but only took one month and never returned for follow up). Associated symptoms include shortness of breath. Current antihyperlipidemic treatment includes statins.    Lab Results  Component Value Date   CREATININE 1.43 (H) 06/28/2019   BUN 22 06/28/2019   NA 137 06/28/2019   K 5.0 06/28/2019   CL 104 06/28/2019   CO2 24 06/28/2019   Lab Results  Component Value Date   CHOL 327 (H) 09/13/2019   HDL 57 09/13/2019   LDLCALC UNABLE TO CALCULATE IF TRIGLYCERIDE OVER 400 mg/dL 09/13/2019   LDLDIRECT 181.1 (H) 09/13/2019   TRIG 412 (H) 09/13/2019   CHOLHDL  5.7 09/13/2019   Lab Results  Component Value Date   TSH 2.575 09/06/2019   Lab Results  Component Value Date   HGBA1C 6.7 (H) 09/13/2019   Lab Results  Component Value Date   WBC 9.5 03/27/2020   HGB 10.9 (L) 03/27/2020   HCT 32.3 (L) 03/27/2020   MCV 88.3 03/27/2020   PLT 261 03/27/2020   Lab Results  Component Value Date   ALT 11 09/09/2018   AST 16 09/09/2018   ALKPHOS 87 09/09/2018   BILITOT 0.7 09/09/2018     Review of Systems  Constitutional:  Positive for fatigue. Negative for chills and fever.  HENT:  Positive for postnasal drip.   Respiratory:  Positive for cough, shortness of breath and wheezing.   Gastrointestinal:  Negative for abdominal pain, constipation and diarrhea.  Neurological:  Negative for dizziness, light-headedness and headaches.  Psychiatric/Behavioral:  Negative for dysphoric mood and sleep disturbance. The patient is not nervous/anxious.    Patient Active Problem List   Diagnosis Date Noted   Atherosclerosis of native arteries of extremity with intermittent claudication ([place]) 05/15/2020   Palpitations 03/29/2020   Stable angina pectoris ([place]) 03/29/2020   Aortic atherosclerosis ([place]) 11/19/2019   Hyperlipidemia 11/15/2019   Other fatigue 09/07/2019   Stage 3b chronic kidney disease ([place]) 12/23/2018   Restless leg syndrome 12/23/2018   Unruptured synovial cyst of popliteal space 12/23/2018   Plantar fasciitis of left foot 12/23/2018   Iron deficiency anemia 11/26/2018   B12 deficiency 11/26/2018   Hypogammaglobulinemia ([place]) 11/11/2018   Anemia due to stage 3b chronic kidney disease ([place]) 11/11/2018   Osteopenia determined by x-ray 02/16/2018   Major depressive  disorder with single episode, in partial remission ([place]) 08/04/2017   Neck pain on right side 08/05/2016   Tinnitus of right ear 08/05/2016   Type II diabetes mellitus with complication ([place]) 78/46/9629   Lymphedema 06/21/2015   History of paroxysmal supraventricular  tachycardia 06/21/2015   PAD (peripheral artery disease) ([place]) 02/02/2015   Hyperlipidemia associated with type 2 diabetes mellitus ([place]) 09/01/2014   Neuropathy 09/01/2014   Phlebectasia 09/01/2014   Essential (primary) hypertension 09/01/2014   Spondylolisthesis at L4-L5 level 07/05/2014   Arthritis, degenerative 01/31/2014    Allergies  Allergen Reactions   Augmentin [Amoxicillin-Pot Clavulanate] Diarrhea   Tetracycline     Other reaction(s): emesis   Cefaclor Rash   Cephalexin Rash   Rosuvastatin Diarrhea   Sulfa Antibiotics Rash    Other reaction(s): emesis    Past Surgical History:  Procedure Laterality Date   ANGIOPLASTY / STENTING FEMORAL Left 2014, 2013   BRAIN MENINGIOMA EXCISION  1988   BREAST BIOPSY Right 1989   benign   CARPAL TUNNEL RELEASE Left 1980   CATARACT EXTRACTION W/PHACO Right 09/02/2019   Procedure: CATARACT EXTRACTION PHACO AND INTRAOCULAR LENS PLACEMENT ([place]);  Surgeon: Eulogio Bear, MD;  Location: [facility] ORS;  Service: Ophthalmology;  Laterality: Right;  [place] 00:27.3 CDE 1.92 AP% Fluid Pack lot # [id number]   CATARACT EXTRACTION W/PHACO Left 10/18/2019   Procedure: CATARACT EXTRACTION PHACO AND INTRAOCULAR LENS PLACEMENT (IOC) LEFT DIABETIC 2.55  00:28.5;  Surgeon: Eulogio Bear, MD;  Location: [place];  Service: Ophthalmology;  Laterality: Left;  Diabetic - oral meds   CESAREAN SECTION     CHOLECYSTECTOMY  1987   COLONOSCOPY WITH PROPOFOL N/A 05/08/2017   Procedure: COLONOSCOPY WITH PROPOFOL;  Surgeon: Jonathon Bellows, MD;  Location: [facility] ENDOSCOPY;  Service: Gastroenterology;  Laterality: N/A;   KNEE ARTHROPLASTY Right 2009   LOWER EXTREMITY ANGIOGRAPHY Left 07/21/2018   Procedure: LOWER EXTREMITY ANGIOGRAPHY;  Surgeon: Katha Cabal, MD;  Location: [place] [facility];  Service: Cardiovascular;  Laterality: Left;    Social History   Tobacco Use   Smoking status: Former    Packs/day: 0.50    Years: 46.00    Pack years: 23.00     Types: Cigarettes    Quit date: 04/05/2013    Years since quitting: 7.2   Smokeless tobacco: Never  Vaping Use   Vaping Use: Never used  Substance Use Topics   Alcohol use: Yes    Alcohol/week: 2.0 standard drinks    Types: 2 Standard drinks or equivalent per week    Comment: occasional   Drug use: No     Medication list has been reviewed and updated.  Current Meds  Medication Sig   albuterol (VENTOLIN HFA) 108 (90 Base) MCG/ACT inhaler Inhale into the lungs.   aspirin 81 MG chewable tablet Chew 81 mg by mouth daily.    atorvastatin (LIPITOR) 40 MG tablet TAKE 1 TABLET DAILY   Cholecalciferol (VITAMIN D) 50 MCG (2000 UT) CAPS Take 2 capsules by mouth daily.   clopidogrel (PLAVIX) 75 MG tablet TAKE 1 TABLET DAILY (Patient taking differently: Take 75 mg by mouth daily.)   Cyanocobalamin (VITAMIN B 12 PO) Take 1,000 mcg by mouth daily. 1000mg  daily   fexofenadine (ALLEGRA) 180 MG tablet Take 180 mg by mouth daily.   guaiFENesin-codeine (ROBITUSSIN AC) 100-10 MG/5ML syrup Take 5 mLs by mouth 3 (three) times daily as needed for cough.   lisinopril (ZESTRIL) 20 MG tablet TAKE 1 TABLET DAILY (REPLACES LISINOPRIL  HCT)   meclizine (ANTIVERT) 12.5 MG tablet Take 1 tablet (12.5 mg total) by mouth 2 (two) times daily as needed for dizziness.   metFORMIN (GLUCOPHAGE) 500 MG tablet Take 1 tablet (500 mg total) by mouth 2 (two) times daily with a meal.   metoprolol tartrate (LOPRESSOR) 25 MG tablet Take 25 mg by mouth 2 (two) times daily.   Multiple Vitamin tablet Take 1 tablet by mouth daily.    ondansetron (ZOFRAN) 4 MG tablet Take 1 tablet (4 mg total) by mouth every 8 (eight) hours as needed for nausea or vomiting.   sertraline (ZOLOFT) 50 MG tablet Take 1 tablet (50 mg total) by mouth daily.    PHQ 2/9 Scores 03/14/2020 01/11/2020 10/20/2019 09/13/2019  PHQ - 2 Score 0 0 0 0  PHQ- 9 Score 0 0 - 0    GAD 7 : Generalized Anxiety Score 03/14/2020 01/11/2020 09/13/2019  Nervous, Anxious, on  Edge 0 0 0  Control/stop worrying 0 0 0  Worry too much - different things 0 0 0  Trouble relaxing 0 0 0  Restless 0 0 0  Easily annoyed or irritable 0 0 0  Afraid - awful might happen 0 0 0  Total GAD 7 Score 0 0 0  Anxiety Difficulty Not difficult at all Not difficult at all Not difficult at all    BP Readings from Last 3 Encounters:  07/14/20 132/82  05/15/20 (!) 151/70  03/14/20 132/74    Physical Exam Vitals and nursing note reviewed.  Constitutional:      General: She is not in acute distress.    Appearance: She is well-developed. She is ill-appearing.  HENT:     Head: Normocephalic and atraumatic.  Cardiovascular:     Rate and Rhythm: Normal rate and regular rhythm.     Pulses: Normal pulses.     Heart sounds: No murmur heard. Pulmonary:     Effort: Pulmonary effort is normal. No respiratory distress.     Breath sounds: Examination of the right-upper field reveals decreased breath sounds. Decreased breath sounds present. No wheezing.  Musculoskeletal:     Cervical back: Normal range of motion.     Right foot: Normal.     Left foot: Normal.  Lymphadenopathy:     Cervical: No cervical adenopathy.  Skin:    General: Skin is warm and dry.     Findings: No rash.  Neurological:     Mental Status: She is alert and oriented to person, place, and time.  Psychiatric:        Mood and Affect: Mood normal.        Behavior: Behavior normal.    Wt Readings from Last 3 Encounters:  07/14/20 182 lb (82.6 kg)  05/15/20 189 lb (85.7 kg)  03/14/20 188 lb (85.3 kg)    BP 132/82   Pulse 66   Temp 98 F (36.7 C) (Oral)   Ht 5\' 2"  (1.575 m)   Wt 182 lb (82.6 kg)   LMP  (LMP Unknown)   SpO2 98%   BMI 33.29 kg/m   Assessment and Plan: 1. Community acquired pneumonia of right upper lobe of lung Obtain CBC, CXR Begin Levofloxacin Continue Allegra for PND and Rx cough medication - DG Chest 2 View; Future - levofloxacin (LEVAQUIN) 750 MG tablet; Take 1 tablet (750 mg  total) by mouth daily for 5 days.  Dispense: 5 tablet; Refill: 0  2. Type II diabetes mellitus with complication (HCC) Clinically stable by exam and  report without s/s of hypoglycemia. DM complicated by hypertension and dyslipidemia. Tolerating medications well without side effects or other concerns. - Comprehensive metabolic panel - Hemoglobin A1c  3. Essential (primary) hypertension Clinically stable exam with well controlled BP. Tolerating medications without side effects at this time. Pt to continue current regimen and low sodium diet; benefits of regular exercise as able discussed. - CBC with Differential/Platelet  4. Hyperlipidemia associated with type 2 diabetes mellitus ([place]) Did not tolerated Crestor 5 mg Continue atorvastatin 40 mg - Lipid panel - Direct LDL   Partially dictated using Editor, commissioning. Any errors are unintentional.  Halina Maidens, MD [facility]  07/14/2020

## 2020-07-15 LAB — COMPREHENSIVE METABOLIC PANEL
ALT: 19 IU/L (ref 0–32)
AST: 14 IU/L (ref 0–40)
Albumin/Globulin Ratio: 1.9 (ref 1.2–2.2)
Albumin: 4.8 g/dL (ref 3.8–4.8)
Alkaline Phosphatase: 101 IU/L (ref 44–121)
BUN/Creatinine Ratio: 13 (ref 12–28)
BUN: 17 mg/dL (ref 8–27)
Bilirubin Total: 0.6 mg/dL (ref 0.0–1.2)
CO2: 19 mmol/L — ABNORMAL LOW (ref 20–29)
Calcium: 10 mg/dL (ref 8.7–10.3)
Chloride: 103 mmol/L (ref 96–106)
Creatinine, Ser: 1.32 mg/dL — ABNORMAL HIGH (ref 0.57–1.00)
Globulin, Total: 2.5 g/dL (ref 1.5–4.5)
Glucose: 152 mg/dL — ABNORMAL HIGH (ref 65–99)
Potassium: 5 mmol/L (ref 3.5–5.2)
Sodium: 139 mmol/L (ref 134–144)
Total Protein: 7.3 g/dL (ref 6.0–8.5)
eGFR: 43 mL/min/{1.73_m2} — ABNORMAL LOW (ref >59)

## 2020-07-15 LAB — CBC WITH DIFFERENTIAL/PLATELET
Basophils Absolute: 0.1 10*3/uL (ref 0.0–0.2)
Basos: 1 %
EOS (ABSOLUTE): 0.3 10*3/uL (ref 0.0–0.4)
Eos: 3 %
Hematocrit: 36.6 % (ref 34.0–46.6)
Hemoglobin: 12.1 g/dL (ref 11.1–15.9)
Immature Grans (Abs): 0.1 10*3/uL (ref 0.0–0.1)
Immature Granulocytes: 1 %
Lymphocytes Absolute: 1.6 10*3/uL (ref 0.7–3.1)
Lymphs: 17 %
MCH: 29.2 pg (ref 26.6–33.0)
MCHC: 33.1 g/dL (ref 31.5–35.7)
MCV: 88 fL (ref 79–97)
Monocytes Absolute: 0.8 10*3/uL (ref 0.1–0.9)
Monocytes: 9 %
Neutrophils Absolute: 6.4 10*3/uL (ref 1.4–7.0)
Neutrophils: 69 %
Platelets: 376 10*3/uL (ref 150–450)
RBC: 4.14 x10E6/uL (ref 3.77–5.28)
RDW: 12.2 % (ref 11.7–15.4)
WBC: 9.3 10*3/uL (ref 3.4–10.8)

## 2020-07-15 LAB — LDL CHOLESTEROL, DIRECT: LDL Direct: 76 mg/dL (ref 0–99)

## 2020-07-15 LAB — LIPID PANEL
Chol/HDL Ratio: 3.6 ratio (ref 0.0–4.4)
Cholesterol, Total: 166 mg/dL (ref 100–199)
HDL: 46 mg/dL (ref >39)
LDL Chol Calc (NIH): 79 mg/dL (ref 0–99)
Triglycerides: 250 mg/dL — ABNORMAL HIGH (ref 0–149)
VLDL Cholesterol Cal: 41 mg/dL — ABNORMAL HIGH (ref 5–40)

## 2020-07-15 LAB — HEMOGLOBIN A1C
Est. average glucose Bld gHb Est-mCnc: 160 mg/dL
Hgb A1c MFr Bld: 7.2 % — ABNORMAL HIGH (ref 4.8–5.6)

## 2020-07-20 ENCOUNTER — Encounter: Payer: Self-pay | Admitting: Internal Medicine

## 2020-07-21 ENCOUNTER — Other Ambulatory Visit: Payer: Self-pay

## 2020-07-21 DIAGNOSIS — R112 Nausea with vomiting, unspecified: Secondary | ICD-10-CM

## 2020-07-21 MED ORDER — ONDANSETRON HCL 4 MG PO TABS: 4.0000 mg | ORAL_TABLET | Freq: Three times a day (TID) | ORAL | 0 refills | Status: DC | PRN

## 2020-07-25 ENCOUNTER — Other Ambulatory Visit: Payer: Self-pay

## 2020-07-25 DIAGNOSIS — N1832 Chronic kidney disease, stage 3b: Secondary | ICD-10-CM

## 2020-07-26 ENCOUNTER — Other Ambulatory Visit: Payer: Self-pay

## 2020-07-26 ENCOUNTER — Inpatient Hospital Stay: Payer: Medicare HMO | Attending: Oncology

## 2020-07-26 DIAGNOSIS — D801 Nonfamilial hypogammaglobulinemia: Secondary | ICD-10-CM | POA: Insufficient documentation

## 2020-07-26 DIAGNOSIS — N1832 Chronic kidney disease, stage 3b: Secondary | ICD-10-CM

## 2020-07-26 DIAGNOSIS — D631 Anemia in chronic kidney disease: Secondary | ICD-10-CM | POA: Insufficient documentation

## 2020-07-26 DIAGNOSIS — E538 Deficiency of other specified B group vitamins: Secondary | ICD-10-CM | POA: Diagnosis not present

## 2020-07-26 LAB — CBC WITH DIFFERENTIAL/PLATELET
Abs Immature Granulocytes: 0.03 10*3/uL (ref 0.00–0.07)
Basophils Absolute: 0.1 10*3/uL (ref 0.0–0.1)
Basophils Relative: 1 %
Eosinophils Absolute: 0.2 10*3/uL (ref 0.0–0.5)
Eosinophils Relative: 3 %
HCT: 32.4 % — ABNORMAL LOW (ref 36.0–46.0)
Hemoglobin: 10.8 g/dL — ABNORMAL LOW (ref 12.0–15.0)
Immature Granulocytes: 1 %
Lymphocytes Relative: 17 %
Lymphs Abs: 1 10*3/uL (ref 0.7–4.0)
MCH: 29.6 pg (ref 26.0–34.0)
MCHC: 33.3 g/dL (ref 30.0–36.0)
MCV: 88.8 fL (ref 80.0–100.0)
Monocytes Absolute: 0.4 10*3/uL (ref 0.1–1.0)
Monocytes Relative: 7 %
Neutro Abs: 4.4 10*3/uL (ref 1.7–7.7)
Neutrophils Relative %: 71 %
Platelets: 228 10*3/uL (ref 150–400)
RBC: 3.65 MIL/uL — ABNORMAL LOW (ref 3.87–5.11)
RDW: 12.6 % (ref 11.5–15.5)
WBC: 6.2 10*3/uL (ref 4.0–10.5)
nRBC: 0 % (ref 0.0–0.2)

## 2020-07-26 LAB — IRON AND TIBC
Iron: 81 ug/dL (ref 28–170)
Saturation Ratios: 28 % (ref 10.4–31.8)
TIBC: 290 ug/dL (ref 250–450)
UIBC: 209 ug/dL

## 2020-07-26 LAB — FERRITIN: Ferritin: 49 ng/mL (ref 11–307)

## 2020-07-27 ENCOUNTER — Encounter: Payer: Self-pay | Admitting: Oncology

## 2020-07-27 ENCOUNTER — Inpatient Hospital Stay: Payer: Medicare HMO | Admitting: Oncology

## 2020-07-27 ENCOUNTER — Inpatient Hospital Stay: Payer: Medicare HMO

## 2020-07-27 VITALS — BP 146/71 | HR 68 | Temp 97.1°F | Resp 18 | Wt 186.1 lb

## 2020-07-27 DIAGNOSIS — N1832 Chronic kidney disease, stage 3b: Secondary | ICD-10-CM

## 2020-07-27 DIAGNOSIS — D631 Anemia in chronic kidney disease: Secondary | ICD-10-CM | POA: Diagnosis not present

## 2020-07-27 DIAGNOSIS — E538 Deficiency of other specified B group vitamins: Secondary | ICD-10-CM

## 2020-07-27 MED ORDER — SODIUM CHLORIDE 0.9 % IV SOLN
Freq: Once | INTRAVENOUS | Status: AC
  Filled 2020-07-27: qty 250

## 2020-07-27 MED ORDER — SODIUM CHLORIDE 0.9 % IV SOLN: 200.0000 mg | Freq: Once | INTRAVENOUS | Status: DC

## 2020-07-27 MED ORDER — IRON SUCROSE 20 MG/ML IV SOLN
200.0000 mg | Freq: Once | INTRAVENOUS | Status: AC
  Administered 2020-07-27: 200 mg via INTRAVENOUS
  Filled 2020-07-27: qty 10

## 2020-07-27 NOTE — Progress Notes (Signed)
[facility]  [address], [address] [place], [place] [address] Phone: [phone]  Fax: [phone]   Clinic Day:  07/27/2020  Referring physician: Glean Hess, MD  Chief Complaint: Misty Villarreal is a 70 y.o. female presents to follow up for anemia in CKD, hypogammaglobulinemia   PERTINENT HEMATOLOGY HISTORY  Patient previously followed up by Dr.Corcoran, patient switched care to me on 07/27/20 Extensive medical record review was performed by me  #Anemia in chronic kidney disease. 11/11/2018  protein electrophoresis showed no M spike  Misty Villarreal received Venofer weekly x 3 (12/15/2018 - 12/29/2018). Patient received IV Venofer treatments  #Vitamin B12 deficiency 11/11/2018, vitamin B12 was 104.   Patient is on oral vitamin B12 supplementation. Anti-parietal antibody and intrinsic factor antibody were normal on 12/14/2018.   #stage IIIB chronic kidney disease felt secondary to diabetes.  .  Patient follows up with nephrology Dr. Holley Raring.   05/08/2017 colonoscopy revealed diverticulosis and non-bleeding internal hemorrhoids.  #Former smoker, Low dose chest CT on 11/16/2019 revealed Lung-RADS 2S, benign appearance or behavior. There was mild diffuse bronchial wall thickening with very mild centrilobular and paraseptal emphysema; imaging findings suggestive of underlying COPD. There were calcifications of the aortic valve. Echocardiographic correlation for evaluation of potential valvular dysfunction may be warranted if clinically indicated.  Today patient reports feeling tired.  No new complaints.  Past Medical History:  Diagnosis Date   Abnormal glandular Papanicolaou smear of cervix 09/01/2014   Normal Pap 2012    Anemia    Chronic kidney disease    stage 3   Depression    Diabetes mellitus without complication (HCC)    DVT (deep venous thrombosis) (HCC)    Hyperlipidemia    Hypertension    Peripheral vascular disease (HCC)    Peripheral  vascular disease ([place])    Slurred speech 12/03/2016    Past Surgical History:  Procedure Laterality Date   ANGIOPLASTY / STENTING FEMORAL Left 2014, 2013   [place]   BREAST BIOPSY Right 1989   benign   CARPAL TUNNEL RELEASE Left 1980   CATARACT EXTRACTION W/PHACO Right 09/02/2019   Procedure: CATARACT EXTRACTION PHACO AND INTRAOCULAR LENS PLACEMENT ([place]);  Surgeon: Eulogio Bear, MD;  Location: [facility] ORS;  Service: Ophthalmology;  Laterality: Right;  [place] 00:27.3 CDE 1.92 AP% Fluid Pack lot # [id number]   CATARACT EXTRACTION W/PHACO Left 10/18/2019   Procedure: CATARACT EXTRACTION PHACO AND INTRAOCULAR LENS PLACEMENT ([facility]) LEFT DIABETIC 2.55  00:28.5;  Surgeon: Eulogio Bear, MD;  Location: [facility];  Service: Ophthalmology;  Laterality: Left;  Diabetic - oral meds   CESAREAN SECTION     CHOLECYSTECTOMY  1987   COLONOSCOPY WITH PROPOFOL N/A 05/08/2017   Procedure: COLONOSCOPY WITH PROPOFOL;  Surgeon: Jonathon Bellows, MD;  Location: [facility] ENDOSCOPY;  Service: Gastroenterology;  Laterality: N/A;   KNEE ARTHROPLASTY Right 2009   LOWER EXTREMITY ANGIOGRAPHY Left 07/21/2018   Procedure: LOWER EXTREMITY ANGIOGRAPHY;  Surgeon: Katha Cabal, MD;  Location: [facility];  Service: Cardiovascular;  Laterality: Left;    Family History  Problem Relation Age of Onset   Diabetes Mother    Heart failure Father    Diabetes Father    Breast cancer Neg Hx     Social History:  reports that Misty Villarreal quit smoking about 7 years ago. Her smoking use included cigarettes. Misty Villarreal has a 23.00 pack-year smoking history. Misty Villarreal has never used smokeless tobacco. Misty Villarreal reports current alcohol use of about  2.0 standard drinks of alcohol per week. Misty Villarreal reports that Misty Villarreal does not use drugs. Misty Villarreal previously smoked a pack of cigarettes a day for 40 years.  Misty Villarreal drinks red wine occasionally.  He denies any exposure to radiation or toxins.  Misty Villarreal is retired.  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  Her daughter's name is [name].  Her husband, [name], died in Jul 05, 2018.  Misty Villarreal lives in [place]. The patient is alone today.  Allergies:  Allergies  Allergen Reactions   Augmentin [Amoxicillin-Pot Clavulanate] Diarrhea   Levaquin [Levofloxacin] Nausea And Vomiting and Other (See Comments)    Ankle pain   Tetracycline     Other reaction(s): emesis   Cefaclor Rash   Cephalexin Rash   Rosuvastatin Diarrhea   Sulfa Antibiotics Rash    Other reaction(s): emesis    Current Medications: Current Outpatient Medications  Medication Sig Dispense Refill   albuterol (VENTOLIN HFA) 108 (90 Base) MCG/ACT inhaler Inhale into the lungs.     aspirin 81 MG chewable tablet Chew 81 mg by mouth daily.      atorvastatin (LIPITOR) 40 MG tablet TAKE 1 TABLET DAILY 90 tablet 0   Cholecalciferol (VITAMIN D) 50 MCG (2000 UT) CAPS Take 2 capsules by mouth daily.     clopidogrel (PLAVIX) 75 MG tablet TAKE 1 TABLET DAILY (Patient taking differently: Take 75 mg by mouth daily.) 90 tablet 3   Cyanocobalamin (VITAMIN B 12 PO) Take 1,000 mcg by mouth daily. 1000mg  daily     fexofenadine (ALLEGRA) 180 MG tablet Take 180 mg by mouth daily.     guaiFENesin-codeine (ROBITUSSIN AC) 100-10 MG/5ML syrup Take 5 mLs by mouth 3 (three) times daily as needed for cough. 118 mL 0   lisinopril (ZESTRIL) 20 MG tablet TAKE 1 TABLET DAILY (REPLACES LISINOPRIL HCT) 90 tablet 0   meclizine (ANTIVERT) 12.5 MG tablet Take 1 tablet (12.5 mg total) by mouth 2 (two) times daily as needed for dizziness. 60 tablet 2   metFORMIN (GLUCOPHAGE) 500 MG tablet Take 1 tablet (500 mg total) by mouth 2 (two) times daily with a meal. 180 tablet 0   metoprolol tartrate (LOPRESSOR) 25 MG tablet Take 25 mg by mouth 2 (two) times daily.     Multiple Vitamin tablet Take 1 tablet by mouth daily.      ondansetron (ZOFRAN) 4 MG tablet Take 1 tablet (4 mg total) by mouth every 8 (eight) hours as needed for nausea or vomiting. 20 tablet  0   sertraline (ZOLOFT) 50 MG tablet Take 1 tablet (50 mg total) by mouth daily. 90 tablet 3   No current facility-administered medications for this visit.    Review of Systems  Constitutional:  Positive for malaise/fatigue (run down). Negative for chills, diaphoresis, fever and weight loss (up 5 lbs).       Feels "better" overall.  HENT:  Negative for congestion, ear discharge, ear pain, hearing loss, nosebleeds, sinus pain, sore throat and tinnitus.   Eyes:  Negative for blurred vision, double vision and photophobia.  Respiratory:  Negative for cough, hemoptysis, sputum production and shortness of breath.   Cardiovascular:  Negative for chest pain, palpitations, orthopnea and leg swelling.         On Plavix for peripheral vascular disease  Gastrointestinal:  Negative for abdominal pain, blood in stool, constipation, diarrhea (on Imodium), heartburn, melena, nausea and vomiting.       IBS.  No prior EGD.  Genitourinary:  Negative for dysuria, frequency, hematuria and  urgency.  Musculoskeletal:  Positive for myalgias (leg cramps). Negative for back pain, joint pain and neck pain.       Chronic leg pain.  Skin:  Negative for itching and rash.  Neurological: Negative.  Negative for dizziness, tingling, tremors, sensory change, speech change, focal weakness, weakness and headaches.  Endo/Heme/Allergies:  Negative for environmental allergies (on Allegra). Does not bruise/bleed easily.  Psychiatric/Behavioral:  Positive for depression. Negative for hallucinations and memory loss. The patient is not nervous/anxious and does not have insomnia.   All other systems reviewed and are negative. Performance status (ECOG):  1  Vital Signs: Blood pressure (!) 146/71, pulse 68, temperature (!) 97.1 F (36.2 C), temperature source Tympanic, resp. rate 18, weight 186 lb 1.1 oz (84.4 kg), SpO2 100 %.   Physical Exam Vitals and nursing note reviewed.  Constitutional:      General: Misty Villarreal is not in acute  distress.    Appearance: Misty Villarreal is well-developed. Misty Villarreal is not diaphoretic.  HENT:     Head: Normocephalic and atraumatic.     Mouth/Throat:     Mouth: Mucous membranes are moist.     Pharynx: Oropharynx is clear.  Eyes:     General: No scleral icterus.    Pupils: Pupils are equal, round, and reactive to light.  Cardiovascular:     Rate and Rhythm: Normal rate and regular rhythm.     Heart sounds: Normal heart sounds. No murmur heard. Pulmonary:     Effort: Pulmonary effort is normal. No respiratory distress.     Breath sounds: Normal breath sounds. No wheezing or rales.  Chest:     Chest wall: No tenderness.  Breasts:    Right: No axillary adenopathy or supraclavicular adenopathy.     Left: No axillary adenopathy or supraclavicular adenopathy.  Abdominal:     General: Bowel sounds are normal. There is no distension.     Palpations: Abdomen is soft. There is no hepatomegaly, splenomegaly or mass.     Tenderness: There is no abdominal tenderness. There is no right CVA tenderness, guarding or rebound.  Musculoskeletal:        General: No swelling or tenderness. Normal range of motion.     Cervical back: Normal range of motion and neck supple.  Lymphadenopathy:     Head:     Right side of head: No preauricular, posterior auricular or occipital adenopathy.     Left side of head: No preauricular, posterior auricular or occipital adenopathy.     Cervical: No cervical adenopathy.     Upper Body:     Right upper body: No supraclavicular or axillary adenopathy.     Left upper body: No supraclavicular or axillary adenopathy.     Lower Body: No right inguinal adenopathy. No left inguinal adenopathy.  Skin:    General: Skin is warm and dry.  Neurological:     Mental Status: Misty Villarreal is alert and oriented to person, place, and time.  Psychiatric:        Mood and Affect: Mood normal.   Appointment on 07/26/2020  Component Date Value Ref Range Status   Ferritin 07/26/2020 49  11 - 307 ng/mL  Final   Performed at [facility], [address] [place], [place] [address]   Iron 07/26/2020 81  28 - 170 ug/dL Final   TIBC 07/26/2020 290  250 - 450 ug/dL Final   Saturation Ratios 07/26/2020 28  10.4 - 31.8 % Final   UIBC 07/26/2020 209  ug/dL Final  Performed at [facility], [address] [place], [place] [address]   WBC 07/26/2020 6.2  4.0 - 10.5 K/uL Final   RBC 07/26/2020 3.65 (A) 3.87 - 5.11 MIL/uL Final   Hemoglobin 07/26/2020 10.8 (A) 12.0 - 15.0 g/dL Final   HCT 07/26/2020 32.4 (A) 36.0 - 46.0 % Final   MCV 07/26/2020 88.8  80.0 - 100.0 fL Final   MCH 07/26/2020 29.6  26.0 - 34.0 pg Final   MCHC 07/26/2020 33.3  30.0 - 36.0 g/dL Final   RDW 07/26/2020 12.6  11.5 - 15.5 % Final   Platelets 07/26/2020 228  150 - 400 K/uL Final   nRBC 07/26/2020 0.0  0.0 - 0.2 % Final   Neutrophils Relative % 07/26/2020 71  % Final   Neutro Abs 07/26/2020 4.4  1.7 - 7.7 K/uL Final   Lymphocytes Relative 07/26/2020 17  % Final   Lymphs Abs 07/26/2020 1.0  0.7 - 4.0 K/uL Final   Monocytes Relative 07/26/2020 7  % Final   Monocytes Absolute 07/26/2020 0.4  0.1 - 1.0 K/uL Final   Eosinophils Relative 07/26/2020 3  % Final   Eosinophils Absolute 07/26/2020 0.2  0.0 - 0.5 K/uL Final   Basophils Relative 07/26/2020 1  % Final   Basophils Absolute 07/26/2020 0.1  0.0 - 0.1 K/uL Final   Immature Granulocytes 07/26/2020 1  % Final   Abs Immature Granulocytes 07/26/2020 0.03  0.00 - 0.07 K/uL Final   Performed at [facility], [address] [place], [place] [address]    Assessment and assessment 1. Anemia due to stage 3b chronic kidney disease ([place])   2. B12 deficiency    #Anemia due to chronic kidney disease. Labs were discussed with patient Her hemoglobin is slightly lower than her baseline at 10.8, Iron panel was available after her visit. Iron saturation 28, ferritin 49. In the context of anemia and chronic kidney disease, will recommend patient to  proceed with IV Venofer 200 mg x 3 for maintenance and to further improve iron store.  # B12 deficiency Vitamin B12 level was not checked today. 11/02/2019, vitamin B12 level is at 1387.  Continue oral B12.  Check B12 the next visit.   RTC in 4 months for labs (CBC with diff, ferritin, iron studies B12).  I discussed the assessment and treatment plan with the patient.  The patient was provided an opportunity to ask questions and all were answered.  The patient agreed with the plan and demonstrated an understanding of the instructions.  The patient was advised to call back if the symptoms worsen or if the condition fails to improve as anticipated. [name], MD, PhD Hematology Oncology [place] at [facility] 07/27/2020

## 2020-07-27 NOTE — Progress Notes (Signed)
Recovering from bronchitis and pneumonia. Had GI upset as well.Feels tired all the time. Here for Follow up for her IDA.

## 2020-08-02 ENCOUNTER — Other Ambulatory Visit (INDEPENDENT_AMBULATORY_CARE_PROVIDER_SITE_OTHER): Payer: Self-pay | Admitting: Vascular Surgery

## 2020-08-02 ENCOUNTER — Other Ambulatory Visit: Payer: Self-pay | Admitting: Internal Medicine

## 2020-08-02 DIAGNOSIS — E118 Type 2 diabetes mellitus with unspecified complications: Secondary | ICD-10-CM

## 2020-08-02 DIAGNOSIS — I10 Essential (primary) hypertension: Secondary | ICD-10-CM

## 2020-08-02 DIAGNOSIS — E782 Mixed hyperlipidemia: Secondary | ICD-10-CM

## 2020-08-10 ENCOUNTER — Inpatient Hospital Stay: Payer: Medicare HMO

## 2020-08-10 ENCOUNTER — Other Ambulatory Visit: Payer: Self-pay

## 2020-08-10 VITALS — BP 132/78 | HR 58 | Temp 97.0°F | Resp 18

## 2020-08-10 DIAGNOSIS — N1832 Chronic kidney disease, stage 3b: Secondary | ICD-10-CM | POA: Diagnosis not present

## 2020-08-10 DIAGNOSIS — D631 Anemia in chronic kidney disease: Secondary | ICD-10-CM

## 2020-08-10 MED ORDER — IRON SUCROSE 20 MG/ML IV SOLN
200.0000 mg | Freq: Once | INTRAVENOUS | Status: AC
  Administered 2020-08-10: 200 mg via INTRAVENOUS
  Filled 2020-08-10: qty 10

## 2020-08-10 MED ORDER — SODIUM CHLORIDE 0.9 % IV SOLN
Freq: Once | INTRAVENOUS | Status: AC
  Filled 2020-08-10: qty 250

## 2020-08-10 MED ORDER — SODIUM CHLORIDE 0.9 % IV SOLN: 200.0000 mg | Freq: Once | INTRAVENOUS | Status: DC

## 2020-08-24 ENCOUNTER — Other Ambulatory Visit: Payer: Self-pay

## 2020-08-24 ENCOUNTER — Inpatient Hospital Stay: Payer: Medicare HMO | Attending: Oncology

## 2020-08-24 DIAGNOSIS — N1832 Chronic kidney disease, stage 3b: Secondary | ICD-10-CM | POA: Diagnosis present

## 2020-08-24 DIAGNOSIS — D631 Anemia in chronic kidney disease: Secondary | ICD-10-CM | POA: Diagnosis present

## 2020-08-24 MED ORDER — IRON SUCROSE 20 MG/ML IV SOLN
200.0000 mg | Freq: Once | INTRAVENOUS | Status: AC
  Administered 2020-08-24: 200 mg via INTRAVENOUS
  Filled 2020-08-24: qty 10

## 2020-08-24 MED ORDER — SODIUM CHLORIDE 0.9 % IV SOLN: 200.0000 mg | Freq: Once | INTRAVENOUS | Status: DC

## 2020-08-24 MED ORDER — SODIUM CHLORIDE 0.9 % IV SOLN
INTRAVENOUS | Status: DC | PRN
  Administered 2020-08-24: 250 mL via INTRAVENOUS
  Filled 2020-08-24: qty 250

## 2020-09-14 ENCOUNTER — Other Ambulatory Visit: Payer: Self-pay | Admitting: Internal Medicine

## 2020-09-14 NOTE — Telephone Encounter (Signed)
Requested medication (s) are due for refill today:  no  Requested medication (s) are on the active medication list: yes   Last refill:  04/27/2020  Future visit scheduled: yes  Notes to clinic: This refill cannot be delegated    Requested Prescriptions  Pending Prescriptions Disp Refills   meclizine (ANTIVERT) 12.5 MG tablet [Pharmacy Med Name: MECLIZINE 12.'5MG'$  (RX) TABLETS] 60 tablet 2    Sig: TAKE 1 TABLET(12.5 MG) BY MOUTH TWICE DAILY AS NEEDED FOR DIZZINESS     Not Delegated - Gastroenterology: Antiemetics Failed - 09/14/2020  2:24 PM      Failed - This refill cannot be delegated      Passed - Valid encounter within last 6 months    Recent Outpatient Visits           2 months ago Community acquired pneumonia of right upper lobe of lung   [facility] Glean Hess, MD   6 months ago Pneumonia due to COVID-19 virus   [facility] Glean Hess, MD   8 months ago Tachycardia   [facility] Glean Hess, MD   1 year ago Annual physical exam   [facility] Glean Hess, MD   1 year ago Type II diabetes mellitus with complication [facility])   [facility] Glean Hess, MD       Future Appointments             In 2 months Army Melia Jesse Sans, MD [facility], [facility]

## 2020-10-13 ENCOUNTER — Other Ambulatory Visit: Payer: Self-pay | Admitting: Internal Medicine

## 2020-10-13 DIAGNOSIS — F32A Depression, unspecified: Secondary | ICD-10-CM

## 2020-10-23 ENCOUNTER — Other Ambulatory Visit: Payer: Self-pay

## 2020-10-23 ENCOUNTER — Ambulatory Visit (INDEPENDENT_AMBULATORY_CARE_PROVIDER_SITE_OTHER): Payer: Medicare HMO

## 2020-10-23 VITALS — BP 138/84 | HR 67 | Temp 98.1°F | Resp 16 | Ht 62.0 in | Wt 185.2 lb

## 2020-10-23 DIAGNOSIS — Z23 Encounter for immunization: Secondary | ICD-10-CM

## 2020-10-23 DIAGNOSIS — Z0000 Encounter for general adult medical examination without abnormal findings: Secondary | ICD-10-CM

## 2020-10-23 DIAGNOSIS — Z1231 Encounter for screening mammogram for malignant neoplasm of breast: Secondary | ICD-10-CM

## 2020-10-23 NOTE — Progress Notes (Signed)
Subjective:   Misty Villarreal is a 70 y.o. female who presents for Medicare Annual (Subsequent) preventive examination.  Review of Systems     Cardiac Risk Factors include: advanced age (>16men, >108 women);diabetes mellitus;dyslipidemia;hypertension;obesity (BMI >30kg/m2)     Objective:    Today's Vitals   10/23/20 1120  BP: 138/84  Pulse: 67  Resp: 16  Temp: 98.1 F (36.7 C)  TempSrc: Oral  SpO2: 98%  Weight: 185 lb 3.2 oz (84 kg)  Height: 5\' 2"  (1.575 m)   Body mass index is 33.87 kg/m.  Advanced Directives 10/23/2020 07/27/2020 12/28/2019 10/20/2019 10/18/2019 09/07/2019 03/08/2019  Does Patient Have a Medical Advance Directive? No No No No No No Yes  Type of Advance Directive - - - - - - Healthcare Power of Attorney  Does patient want to make changes to medical advance directive? No - Patient declined No - Patient declined No - Patient declined - - No - Patient declined -  Copy of [place] in Chart? - - - - - No - copy requested No - copy requested  Would patient like information on creating a medical advance directive? - No - Patient declined No - Patient declined Yes (MAU/Ambulatory/Procedural Areas - Information given) Yes (MAU/Ambulatory/Procedural Areas - Information given) No - Patient declined -    Current Medications (verified) Outpatient Encounter Medications as of 10/23/2020  Medication Sig   albuterol (VENTOLIN HFA) 108 (90 Base) MCG/ACT inhaler Inhale into the lungs.   aspirin 81 MG chewable tablet Chew 81 mg by mouth daily.    atorvastatin (LIPITOR) 40 MG tablet TAKE 1 TABLET DAILY   Cholecalciferol (VITAMIN D) 50 MCG (2000 UT) CAPS Take 2 capsules by mouth daily.   clopidogrel (PLAVIX) 75 MG tablet TAKE 1 TABLET DAILY   Cyanocobalamin (VITAMIN B 12 PO) Take 1,000 mcg by mouth daily. 1000mg  daily   fexofenadine (ALLEGRA) 180 MG tablet Take 180 mg by mouth daily.   lisinopril (ZESTRIL) 20 MG tablet TAKE 1 TABLET DAILY (REPLACES LISINOPRIL  HCT)   meclizine (ANTIVERT) 12.5 MG tablet TAKE 1 TABLET(12.5 MG) BY MOUTH TWICE DAILY AS NEEDED FOR DIZZINESS   metFORMIN (GLUCOPHAGE) 500 MG tablet TAKE 1 TABLET TWICE A DAY WITH MEALS   metoprolol tartrate (LOPRESSOR) 50 MG tablet Take 50 mg by mouth 2 (two) times daily.   Multiple Vitamin tablet Take 1 tablet by mouth daily.    sertraline (ZOLOFT) 50 MG tablet TAKE 1 TABLET DAILY   [DISCONTINUED] atorvastatin (LIPITOR) 40 MG tablet TAKE 1 TABLET DAILY   [DISCONTINUED] clopidogrel (PLAVIX) 75 MG tablet TAKE 1 TABLET DAILY (Patient taking differently: Take 75 mg by mouth daily.)   [DISCONTINUED] guaiFENesin-codeine (ROBITUSSIN AC) 100-10 MG/5ML syrup Take 5 mLs by mouth 3 (three) times daily as needed for cough.   [DISCONTINUED] lisinopril (ZESTRIL) 20 MG tablet TAKE 1 TABLET DAILY (REPLACES LISINOPRIL HCT)   [DISCONTINUED] meclizine (ANTIVERT) 12.5 MG tablet Take 1 tablet (12.5 mg total) by mouth 2 (two) times daily as needed for dizziness.   [DISCONTINUED] metFORMIN (GLUCOPHAGE) 500 MG tablet Take 1 tablet (500 mg total) by mouth 2 (two) times daily with a meal.   [DISCONTINUED] metoprolol tartrate (LOPRESSOR) 25 MG tablet Take 25 mg by mouth 2 (two) times daily.   [DISCONTINUED] ondansetron (ZOFRAN) 4 MG tablet Take 1 tablet (4 mg total) by mouth every 8 (eight) hours as needed for nausea or vomiting.   [DISCONTINUED] ondansetron (ZOFRAN) 4 MG tablet Take 1 tablet (4 mg total) by  mouth every 8 (eight) hours as needed for nausea or vomiting.   [DISCONTINUED] sertraline (ZOLOFT) 50 MG tablet Take 1 tablet (50 mg total) by mouth daily.   No facility-administered encounter medications on file as of 10/23/2020.    Allergies (verified) Augmentin [amoxicillin-pot clavulanate], Levaquin [levofloxacin], Tetracycline, Cefaclor, Cephalexin, Rosuvastatin, and Sulfa antibiotics   History: Past Medical History:  Diagnosis Date   Abnormal glandular Papanicolaou smear of cervix 09/01/2014   Normal Pap  2012    Anemia    Chronic kidney disease    stage 3   Depression    Diabetes mellitus without complication (HCC)    DVT (deep venous thrombosis) (HCC)    Hyperlipidemia    Hypertension    Peripheral vascular disease ([place])    Peripheral vascular disease ([place])    Slurred speech 12/03/2016   Past Surgical History:  Procedure Laterality Date   ANGIOPLASTY / STENTING FEMORAL Left 2014, 2013   [place] Right 1989   benign   CARPAL TUNNEL RELEASE Left 1980   CATARACT EXTRACTION W/PHACO Right 09/02/2019   Procedure: CATARACT EXTRACTION PHACO AND INTRAOCULAR LENS PLACEMENT ([place]);  Surgeon: Eulogio Bear, MD;  Location: [facility];  Service: Ophthalmology;  Laterality: Right;  [place] 00:27.3 CDE 1.92 AP% Fluid Pack lot # [id number]   CATARACT EXTRACTION W/PHACO Left 10/18/2019   Procedure: CATARACT EXTRACTION PHACO AND INTRAOCULAR LENS PLACEMENT ([facility]) LEFT DIABETIC 2.55  00:28.5;  Surgeon: Eulogio Bear, MD;  Location: [place];  Service: Ophthalmology;  Laterality: Left;  Diabetic - oral meds   CESAREAN SECTION     CHOLECYSTECTOMY  1987   COLONOSCOPY WITH PROPOFOL N/A 05/08/2017   Procedure: COLONOSCOPY WITH PROPOFOL;  Surgeon: Jonathon Bellows, MD;  Location: [facility] ENDOSCOPY;  Service: Gastroenterology;  Laterality: N/A;   KNEE ARTHROPLASTY Right 2009   LOWER EXTREMITY ANGIOGRAPHY Left 07/21/2018   Procedure: LOWER EXTREMITY ANGIOGRAPHY;  Surgeon: Katha Cabal, MD;  Location: [facility];  Service: Cardiovascular;  Laterality: Left;   Family History  Problem Relation Age of Onset   Diabetes Mother    Heart failure Father    Diabetes Father    Breast cancer Neg Hx    Social History   Socioeconomic History   Marital status: Widowed    Spouse name: Not on file   Number of children: 2   Years of education: Not on file   Highest education level: Not on file  Occupational History   Not on file  Tobacco Use   Smoking status:  Former    Packs/day: 0.50    Years: 46.00    Pack years: 23.00    Types: Cigarettes    Quit date: 04/05/2013    Years since quitting: 7.5   Smokeless tobacco: Never  Vaping Use   Vaping Use: Never used  Substance and Sexual Activity   Alcohol use: Yes    Alcohol/week: 2.0 standard drinks    Types: 2 Standard drinks or equivalent per week    Comment: occasional   Drug use: No   Sexual activity: Not on file  Other Topics Concern   Not on file  Social History Narrative   Pt lives alone   Social Determinants of Health   Financial Resource Strain: Low Risk    Difficulty of Paying Living Expenses: Not hard at all  Food Insecurity: No Food Insecurity   Worried About Charity fundraiser in the Last Year: Never true  Ran Out of Food in the Last Year: Never true  Transportation Needs: No Transportation Needs   Lack of Transportation (Medical): No   Lack of Transportation (Non-Medical): No  Physical Activity: Inactive   Days of Exercise per Week: 0 days   Minutes of Exercise per Session: 0 min  Stress: No Stress Concern Present   Feeling of Stress : Not at all  Social Connections: Socially Isolated   Frequency of Communication with Friends and Family: More than three times a week   Frequency of Social Gatherings with Friends and Family: More than three times a week   Attends Religious Services: Never   Marine scientist or Organizations: No   Attends Archivist Meetings: Never   Marital Status: Widowed    Tobacco Counseling Counseling given: Not Answered   Clinical Intake:  Pre-visit preparation completed: Yes  Pain : No/denies pain     BMI - recorded: 33.87 Nutritional Status: BMI > 30  Obese Nutritional Risks: None Diabetes: Yes CBG done?: No Did pt. bring in CBG monitor from home?: No  How often do you need to have someone help you when you read instructions, pamphlets, or other written materials from your doctor or pharmacy?: 1 -  Never  Nutrition Risk Assessment:  Has the patient had any N/V/D within the last 2 months?  Yes  Does the patient have any non-healing wounds?  No  Has the patient had any unintentional weight loss or weight gain?  No   Diabetes:  Is the patient diabetic?  Yes  If diabetic, was a CBG obtained today?  No  Did the patient bring in their glucometer from home?  No  How often do you monitor your CBG's? 3 times per week.   Financial Strains and Diabetes Management:  Are you having any financial strains with the device, your supplies or your medication? No .  Does the patient want to be seen by Chronic Care Management for management of their diabetes?  No  Would the patient like to be referred to a Nutritionist or for Diabetic Management?  No   Diabetic Exams:  Diabetic Eye Exam: Completed 07/13/19 negative retinopathy. Overdue for diabetic eye exam. Pt has been advised about the importance in completing this exam.   Diabetic Foot Exam: Completed 07/14/20. Interpreter Needed?: No  Information entered by :: Clemetine Marker LPN   Activities of Daily Living In your present state of health, do you have any difficulty performing the following activities: 10/23/2020 01/11/2020  Hearing? N N  Vision? N N  Difficulty concentrating or making decisions? N N  Walking or climbing stairs? N N  Dressing or bathing? N N  Doing errands, shopping? N N  Preparing Food and eating ? N -  Using the Toilet? N -  In the past six months, have you accidently leaked urine? N -  Do you have problems with loss of bowel control? Y -  Comment frequent diarrhea -  Managing your Medications? N -  Managing your Finances? N -  Housekeeping or managing your Housekeeping? N -  Some recent data might be hidden    Patient Care Team: Glean Hess, MD as PCP - General (Internal Medicine) Delana Meyer, Dolores Lory, MD (Vascular Surgery) Anthonette Legato, MD (Nephrology)  Indicate any recent Medical Services you may have  received from other than [facility] providers in the past year (date may be approximate).     Assessment:   This is a routine wellness examination for  Nastasia.  Hearing/Vision screen Hearing Screening - Comments:: Pt denies hearing difficulty Vision Screening - Comments:: Annual vision screenings done by [facility]; due for exam  Dietary issues and exercise activities discussed: Current Exercise Habits: The patient does not participate in regular exercise at present, Exercise limited by: None identified   Goals Addressed             This Visit's Progress    Patient Stated       Patient states she would like to lower A1c back to less than 7% and increase activity        Depression Screen PHQ 2/9 Scores 10/23/2020 03/14/2020 01/11/2020 10/20/2019 09/13/2019 12/23/2018 09/09/2018  PHQ - 2 Score 0 0 0 0 0 0 1  PHQ- 9 Score - 0 0 - 0 0 1    Fall Risk Fall Risk  10/23/2020 03/14/2020 01/11/2020 10/20/2019 09/13/2019  Falls in the past year? 1 0 0 1 1  Number falls in past yr: 0 0 0 1 1  Injury with Fall? 0 0 0 0 0  Comment - - - - -  Risk Factor Category  - - - - -  Risk for fall due to : No Fall Risks - Impaired balance/gait;History of fall(s) History of fall(s) History of fall(s);Impaired balance/gait  Follow up Falls prevention discussed Falls evaluation completed Falls evaluation completed Falls prevention discussed Falls evaluation completed    FALL RISK PREVENTION PERTAINING TO THE HOME:  Any stairs in or around the home? Yes  If so, are there any without handrails? No  Home free of loose throw rugs in walkways, pet beds, electrical cords, etc? Yes  Adequate lighting in your home to reduce risk of falls? Yes   ASSISTIVE DEVICES UTILIZED TO PREVENT FALLS:  Life alert? No  Use of a cane, walker or w/c? No  Grab bars in the bathroom? Yes  Shower chair or bench in shower? Yes  Elevated toilet seat or a handicapped toilet? Yes   TIMED UP AND GO:  Was the test performed?  Yes .  Length of time to ambulate 10 feet: 6 sec.   Gait steady and fast without use of assistive device  Cognitive Function: Normal cognitive status assessed by direct observation by this Nurse Health Advisor. No abnormalities found.       6CIT Screen 10/20/2019 08/04/2017 08/05/2016  What Year? 0 points 0 points 0 points  What month? 0 points 0 points 0 points  What time? 0 points 0 points 0 points  Count back from 20 0 points 0 points 0 points  Months in reverse 0 points 0 points 0 points  Repeat phrase 0 points 0 points 0 points  Total Score 0 0 0    Immunizations Immunization History  Administered Date(s) Administered   Fluad Quad(high Dose 65+) 10/23/2020   Influenza, High Dose Seasonal PF 11/13/2016, 11/12/2018   Influenza-Unspecified 11/21/2016, 12/18/2017, 11/22/2019   PFIZER(Purple Top)SARS-COV-2 Vaccination 03/24/2019, 04/14/2019   Pneumococcal Conjugate-13 08/04/2015   Pneumococcal Polysaccharide-23 12/15/2012, 09/09/2018   Zoster Recombinat (Shingrix) 12/04/2017, 11/12/2018    TDAP status: Due, Education has been provided regarding the importance of this vaccine. Advised may receive this vaccine at local pharmacy or Health Dept. Aware to provide a copy of the vaccination record if obtained from local pharmacy or Health Dept. Verbalized acceptance and understanding.  Flu Vaccine status: Completed at today's visit  Pneumococcal vaccine status: Up to date  Covid-19 vaccine status: Completed vaccines  Qualifies for Shingles Vaccine? Yes  Zostavax completed No   Shingrix Completed?: Yes  Screening Tests Health Maintenance  Topic Date Due   COVID-19 Vaccine (3 - Booster for Pfizer series) 09/14/2019   OPHTHALMOLOGY EXAM  07/12/2020   TETANUS/TDAP  01/10/2021 (Originally 10/04/1969)   MAMMOGRAM  12/02/2020   HEMOGLOBIN A1C  01/13/2021   FOOT EXAM  07/14/2021   COLONOSCOPY (Pts 45-40yrs Insurance coverage will need to be confirmed)  05/09/2027   INFLUENZA  VACCINE  Completed   DEXA SCAN  Completed   Zoster Vaccines- Shingrix  Completed   Hepatitis C Screening  Addressed   HPV VACCINES  Aged Out    Health Maintenance  Health Maintenance Due  Topic Date Due   COVID-19 Vaccine (3 - Booster for Pfizer series) 09/14/2019   OPHTHALMOLOGY EXAM  07/12/2020    Colorectal cancer screening: Type of screening: Colonoscopy. Completed 05/08/17. Repeat every 10 years  Mammogram status: Completed 12/03/19. Repeat every year  Bone Density status: Completed 02/16/18. Results reflect: Bone density results: OSTEOPENIA. Repeat every 2 years. Pt declines repeat screening at this time.   Lung Cancer Screening: (Low Dose CT Chest recommended if Age 88-80 years, 30 pack-year currently smoking OR have quit w/in 15years.) does not qualify.   Additional Screening:  Hepatitis C Screening: does qualify; Completed 05/22/14  Vision Screening: Recommended annual ophthalmology exams for early detection of glaucoma and other disorders of the eye. Is the patient up to date with their annual eye exam?  No  Who is the provider or what is the name of the office in which the patient attends annual eye exams? [facility].   Dental Screening: Recommended annual dental exams for proper oral hygiene  Community Resource Referral / Chronic Care Management: CRR required this visit?  No   CCM required this visit?  No      Plan:     I have personally reviewed and noted the following in the patient's chart:   Medical and social history Use of alcohol, tobacco or illicit drugs  Current medications and supplements including opioid prescriptions.  Functional ability and status Nutritional status Physical activity Advanced directives List of other physicians Hospitalizations, surgeries, and ER visits in previous 12 months Vitals Screenings to include cognitive, depression, and falls Referrals and appointments  In addition, I have reviewed and discussed with  patient certain preventive protocols, quality metrics, and best practice recommendations. A written personalized care plan for preventive services as well as general preventive health recommendations were provided to patient.     Clemetine Marker, LPN   [id number]   Nurse Notes: pt c/o frequent diarrhea and concerned about continuing metformin due to stage 3 kidney disease. Pt states she takes immodium as needed but interested in referral to gastroenterology for IBS management. Pt to discuss with Dr. Army Melia. Pt also concerned that metformin is also causing diarrhea even though she has been taking it for 20+ years. A1c 7.2% 07/14/20 which is higher than usual for her and fasting blood sugar at home ranging from 160-200.

## 2020-10-23 NOTE — Patient Instructions (Signed)
Misty Villarreal , Thank you for taking time to come for your Medicare Wellness Visit. I appreciate your ongoing commitment to your health goals. Please review the following plan we discussed and let me know if I can assist you in the future.   Screening recommendations/referrals: Colonoscopy: done 05/08/17. Repeat 04/2027 Mammogram: done 12/03/19. Please call [phone] to schedule your mammogram.  Bone Density: done 02/16/18 Recommended yearly ophthalmology/optometry visit for glaucoma screening and checkup Recommended yearly dental visit for hygiene and checkup  Vaccinations: Influenza vaccine: done today Pneumococcal vaccine: done 09/09/18 Tdap vaccine: due Shingles vaccine: done 12/04/17 & 11/12/18   Covid-19:done 03/24/19 & 04/14/19  Advanced directives: Please bring a copy of your health care power of attorney and living will to the office at your convenience once you have completed those documents.   Conditions/risks identified: Recommend increasing physical activity to at least 3 days per week   Next appointment: Follow up in one year for your annual wellness visit    Preventive Care 65 Years and Older, Female Preventive care refers to lifestyle choices and visits with your health care provider that can promote health and wellness. What does preventive care include? A yearly physical exam. This is also called an annual well check. Dental exams once or twice a year. Routine eye exams. Ask your health care provider how often you should have your eyes checked. Personal lifestyle choices, including: Daily care of your teeth and gums. Regular physical activity. Eating a healthy diet. Avoiding tobacco and drug use. Limiting alcohol use. Practicing safe sex. Taking low-dose aspirin every day. Taking vitamin and mineral supplements as recommended by your health care provider. What happens during an annual well check? The services and screenings done by your health care provider during  your annual well check will depend on your age, overall health, lifestyle risk factors, and family history of disease. Counseling  Your health care provider may ask you questions about your: Alcohol use. Tobacco use. Drug use. Emotional well-being. Home and relationship well-being. Sexual activity. Eating habits. History of falls. Memory and ability to understand (cognition). Work and work Statistician. Reproductive health. Screening  You may have the following tests or measurements: Height, weight, and BMI. Blood pressure. Lipid and cholesterol levels. These may be checked every 5 years, or more frequently if you are over 20 years old. Skin check. Lung cancer screening. You may have this screening every year starting at age 66 if you have a 30-pack-year history of smoking and currently smoke or have quit within the past 15 years. Fecal occult blood test (FOBT) of the stool. You may have this test every year starting at age 13. Flexible sigmoidoscopy or colonoscopy. You may have a sigmoidoscopy every 5 years or a colonoscopy every 10 years starting at age 15. Hepatitis C blood test. Hepatitis B blood test. Sexually transmitted disease (STD) testing. Diabetes screening. This is done by checking your blood sugar (glucose) after you have not eaten for a while (fasting). You may have this done every 1-3 years. Bone density scan. This is done to screen for osteoporosis. You may have this done starting at age 34. Mammogram. This may be done every 1-2 years. Talk to your health care provider about how often you should have regular mammograms. Talk with your health care provider about your test results, treatment options, and if necessary, the need for more tests. Vaccines  Your health care provider may recommend certain vaccines, such as: Influenza vaccine. This is recommended every year. Tetanus, diphtheria, and  acellular pertussis (Tdap, Td) vaccine. You may need a Td booster every 10  years. Zoster vaccine. You may need this after age 30. Pneumococcal 13-valent conjugate (PCV13) vaccine. One dose is recommended after age 82. Pneumococcal polysaccharide (PPSV23) vaccine. One dose is recommended after age 45. Talk to your health care provider about which screenings and vaccines you need and how often you need them. This information is not intended to replace advice given to you by your health care provider. Make sure you discuss any questions you have with your health care provider. Document Released: 02/03/2015 Document Revised: 09/27/2015 Document Reviewed: 11/08/2014 Elsevier Interactive Patient Education  2017 [employer] Prevention in the Home Falls can cause injuries. They can happen to people of all ages. There are many things you can do to make your home safe and to help prevent falls. What can I do on the outside of my home? Regularly fix the edges of walkways and driveways and fix any cracks. Remove anything that might make you trip as you walk through a door, such as a raised step or threshold. Trim any bushes or trees on the path to your home. Use bright outdoor lighting. Clear any walking paths of anything that might make someone trip, such as rocks or tools. Regularly check to see if handrails are loose or broken. Make sure that both sides of any steps have handrails. Any raised decks and porches should have guardrails on the edges. Have any leaves, snow, or ice cleared regularly. Use sand or salt on walking paths during winter. Clean up any spills in your garage right away. This includes oil or grease spills. What can I do in the bathroom? Use night lights. Install grab bars by the toilet and in the tub and shower. Do not use towel bars as grab bars. Use non-skid mats or decals in the tub or shower. If you need to sit down in the shower, use a plastic, non-slip stool. Keep the floor dry. Clean up any water that spills on the floor as soon as it  happens. Remove soap buildup in the tub or shower regularly. Attach bath mats securely with double-sided non-slip rug tape. Do not have throw rugs and other things on the floor that can make you trip. What can I do in the bedroom? Use night lights. Make sure that you have a light by your bed that is easy to reach. Do not use any sheets or blankets that are too big for your bed. They should not hang down onto the floor. Have a firm chair that has side arms. You can use this for support while you get dressed. Do not have throw rugs and other things on the floor that can make you trip. What can I do in the kitchen? Clean up any spills right away. Avoid walking on wet floors. Keep items that you use a lot in easy-to-reach places. If you need to reach something above you, use a strong step stool that has a grab bar. Keep electrical cords out of the way. Do not use floor polish or wax that makes floors slippery. If you must use wax, use non-skid floor wax. Do not have throw rugs and other things on the floor that can make you trip. What can I do with my stairs? Do not leave any items on the stairs. Make sure that there are handrails on both sides of the stairs and use them. Fix handrails that are broken or loose. Make sure that  handrails are as long as the stairways. Check any carpeting to make sure that it is firmly attached to the stairs. Fix any carpet that is loose or worn. Avoid having throw rugs at the top or bottom of the stairs. If you do have throw rugs, attach them to the floor with carpet tape. Make sure that you have a light switch at the top of the stairs and the bottom of the stairs. If you do not have them, ask someone to add them for you. What else can I do to help prevent falls? Wear shoes that: Do not have high heels. Have rubber bottoms. Are comfortable and fit you well. Are closed at the toe. Do not wear sandals. If you use a stepladder: Make sure that it is fully opened.  Do not climb a closed stepladder. Make sure that both sides of the stepladder are locked into place. Ask someone to hold it for you, if possible. Clearly mark and make sure that you can see: Any grab bars or handrails. First and last steps. Where the edge of each step is. Use tools that help you move around (mobility aids) if they are needed. These include: Canes. Walkers. Scooters. Crutches. Turn on the lights when you go into a dark area. Replace any light bulbs as soon as they burn out. Set up your furniture so you have a clear path. Avoid moving your furniture around. If any of your floors are uneven, fix them. If there are any pets around you, be aware of where they are. Review your medicines with your doctor. Some medicines can make you feel dizzy. This can increase your chance of falling. Ask your doctor what other things that you can do to help prevent falls. This information is not intended to replace advice given to you by your health care provider. Make sure you discuss any questions you have with your health care provider. Document Released: 11/03/2008 Document Revised: 06/15/2015 Document Reviewed: 02/11/2014 Elsevier Interactive Patient Education  2017 [employer].

## 2020-10-27 ENCOUNTER — Ambulatory Visit: Payer: Medicare HMO | Admitting: Family Medicine

## 2020-10-27 ENCOUNTER — Encounter: Payer: Self-pay | Admitting: Family Medicine

## 2020-10-27 ENCOUNTER — Other Ambulatory Visit: Payer: Self-pay

## 2020-10-27 VITALS — BP 132/74 | HR 64 | Temp 98.3°F | Ht 62.0 in | Wt 184.0 lb

## 2020-10-27 DIAGNOSIS — K529 Noninfective gastroenteritis and colitis, unspecified: Secondary | ICD-10-CM | POA: Insufficient documentation

## 2020-10-27 DIAGNOSIS — N3091 Cystitis, unspecified with hematuria: Secondary | ICD-10-CM | POA: Diagnosis not present

## 2020-10-27 LAB — POCT URINALYSIS DIPSTICK
Bilirubin, UA: NEGATIVE
Glucose, UA: NEGATIVE
Ketones, UA: NEGATIVE
Nitrite, UA: NEGATIVE
Protein, UA: NEGATIVE
Spec Grav, UA: 1.015 (ref 1.010–1.025)
Urobilinogen, UA: NEGATIVE E.U./dL — AB
pH, UA: 5 (ref 5.0–8.0)

## 2020-10-27 MED ORDER — NITROFURANTOIN MONOHYD MACRO 100 MG PO CAPS: 100.0000 mg | ORAL_CAPSULE | Freq: Two times a day (BID) | ORAL | 0 refills | Status: DC

## 2020-10-27 MED ORDER — FLUCONAZOLE 150 MG PO TABS: 150.0000 mg | ORAL_TABLET | Freq: Once | ORAL | 2 refills | Status: AC

## 2020-10-27 NOTE — Assessment & Plan Note (Signed)
Chronic issue that is ongoing, may be a contributory element to patient's presenting urinary symptomatology.  Abdominal exam reveals soft, nontender, mildly distended and tympanic abdomen without hepatosplenomegaly, normoactive bowel sounds.  She does have upcoming follow-up visit with Dr. Army Melia, her PCP, for further evaluation management.  Over the interim, given antibiotic prescription, I have advised trial of OTC probiotics and she is amenable to this.  She was also advised to maintain adequate hydration given the ongoing diarrhea.

## 2020-10-27 NOTE — Patient Instructions (Addendum)
-   Dose Macrobid twice daily x7 days - If yeast symptoms occur, dose Diflucan, can be repeated x1 if needed - Continue your current hydration with water throughout the day - Recommend over-the-counter probiotic daily - Our office will contact you for any results dictating medication change - Maintain follow-up with Dr. Army Melia as scheduled - Contact us for any questions

## 2020-10-27 NOTE — Progress Notes (Signed)
Primary Care / Sports Medicine Office Visit  Patient Information:  Patient ID: Misty Villarreal, female DOB: 04-Feb-1950 Age: 70 y.o. MRN: [id number]   Misty Villarreal is a pleasant 70 y.o. female presenting with the following: 70 Chief Complaint  Patient presents with   Urinary Symptoms    Symptoms started last night: burning, dysuria, urinary urgency and frequency; 6/10 pain    Review of Systems pertinent details above   Patient Active Problem List   Diagnosis Date Noted   Hemorrhagic cystitis 10/27/2020   Chronic diarrhea 10/27/2020   Atherosclerosis of native arteries of extremity with intermittent claudication ([place]) 05/15/2020   Palpitations 03/29/2020   Stable angina pectoris ([place]) 03/29/2020   Aortic atherosclerosis ([place]) 11/19/2019   Hyperlipidemia 11/15/2019   Other fatigue 09/07/2019   Stage 3b chronic kidney disease ([place]) 12/23/2018   Restless leg syndrome 12/23/2018   Unruptured synovial cyst of popliteal space 12/23/2018   Plantar fasciitis of left foot 12/23/2018   Iron deficiency anemia 11/26/2018   B12 deficiency 11/26/2018   Hypogammaglobulinemia ([place]) 11/11/2018   Anemia due to stage 3b chronic kidney disease ([place]) 11/11/2018   Osteopenia determined by x-ray 02/16/2018   Major depressive disorder with single episode, in partial remission ([place]) 08/04/2017   Neck pain on right side 08/05/2016   Tinnitus of right ear 08/05/2016   Type II diabetes mellitus with complication ([place]) 27/03/5007   Lymphedema 06/21/2015   History of paroxysmal supraventricular tachycardia 06/21/2015   PAD (peripheral artery disease) ([place]) 02/02/2015   Hyperlipidemia associated with type 2 diabetes mellitus ([place]) 09/01/2014   Neuropathy 09/01/2014   Phlebectasia 09/01/2014   Essential (primary) hypertension 09/01/2014   Spondylolisthesis at L4-L5 level 07/05/2014   Arthritis, degenerative 01/31/2014   Past Medical History:  Diagnosis Date   Abnormal glandular  Papanicolaou smear of cervix 09/01/2014   Normal Pap 2012    Anemia    Chronic kidney disease    stage 3   Depression    Diabetes mellitus without complication ([place])    DVT (deep venous thrombosis) ([facility])    Hyperlipidemia    Hypertension    Peripheral vascular disease ([place])    Peripheral vascular disease ([place])    Slurred speech 12/03/2016   Outpatient Encounter Medications as of 10/27/2020  Medication Sig   albuterol (VENTOLIN HFA) 108 (90 Base) MCG/ACT inhaler Inhale 1 puff into the lungs as needed.   aspirin 81 MG chewable tablet Chew 81 mg by mouth daily.    atorvastatin (LIPITOR) 40 MG tablet TAKE 1 TABLET DAILY   Cholecalciferol (VITAMIN D) 50 MCG (2000 UT) CAPS Take 2 capsules by mouth daily.   clopidogrel (PLAVIX) 75 MG tablet TAKE 1 TABLET DAILY   Cyanocobalamin (VITAMIN B 12 PO) Take 1,000 mcg by mouth daily. 1000mg  daily   fexofenadine (ALLEGRA) 180 MG tablet Take 180 mg by mouth daily.   fluconazole (DIFLUCAN) 150 MG tablet Take 1 tablet (150 mg total) by mouth once for 1 dose.   lisinopril (ZESTRIL) 20 MG tablet TAKE 1 TABLET DAILY (REPLACES LISINOPRIL HCT)   meclizine (ANTIVERT) 12.5 MG tablet TAKE 1 TABLET(12.5 MG) BY MOUTH TWICE DAILY AS NEEDED FOR DIZZINESS   metFORMIN (GLUCOPHAGE) 500 MG tablet TAKE 1 TABLET TWICE A DAY WITH MEALS   metoprolol tartrate (LOPRESSOR) 50 MG tablet Take 50 mg by mouth 2 (two) times daily.   Multiple Vitamin tablet Take 1 tablet by mouth daily.    nitrofurantoin, macrocrystal-monohydrate, (MACROBID) 100 MG capsule Take 1 capsule (  100 mg total) by mouth 2 (two) times daily.   sertraline (ZOLOFT) 50 MG tablet TAKE 1 TABLET DAILY   No facility-administered encounter medications on file as of 10/27/2020.   Past Surgical History:  Procedure Laterality Date   ANGIOPLASTY / STENTING FEMORAL Left 2014, 2013   BRAIN MENINGIOMA EXCISION  1988   BREAST BIOPSY Right 1989   benign   CARPAL TUNNEL RELEASE Left 1980   CATARACT EXTRACTION W/PHACO  Right 09/02/2019   Procedure: CATARACT EXTRACTION PHACO AND INTRAOCULAR LENS PLACEMENT ([place]);  Surgeon: Eulogio Bear, MD;  Location: [facility] ORS;  Service: Ophthalmology;  Laterality: Right;  [place] 00:27.3 CDE 1.92 AP% Fluid Pack lot # [id number]   CATARACT EXTRACTION W/PHACO Left 10/18/2019   Procedure: CATARACT EXTRACTION PHACO AND INTRAOCULAR LENS PLACEMENT ([facility]) LEFT DIABETIC 2.55  00:28.5;  Surgeon: Eulogio Bear, MD;  Location: [facility];  Service: Ophthalmology;  Laterality: Left;  Diabetic - oral meds   CESAREAN SECTION     CHOLECYSTECTOMY  1987   COLONOSCOPY WITH PROPOFOL N/A 05/08/2017   Procedure: COLONOSCOPY WITH PROPOFOL;  Surgeon: Jonathon Bellows, MD;  Location[facility] END[facility];  Service: Gastroenterology;  Laterality: N/A;   KNEE ARTHROPLASTY Right 2009   LOWER EXTREMITY ANGIOGRAPHY Left 07/21/2018   Procedure: LOWER EXTREMITY ANGIOGRAPHY;  Surgeon: Katha Cabal, MD;  Location: [place] [facility];  Service: Cardiovascular;  Laterality: Left;    Vitals:   10/27/20 1623  BP: 132/74  Pulse: 64  Temp: 98.3 F (36.8 C)  SpO2: 97%   Vitals:   10/27/20 1623  Weight: 184 lb (83.5 kg)  Height: 5\' 2"  (1.575 m)   Body mass index is 33.65 kg/m.  No results found.   Independent interpretation of notes and tests performed by another provider:   None  Procedures performed:   None  Pertinent History, Exam, Impression, and Recommendations:   Chronic diarrhea Chronic issue that is ongoing, may be a contributory element to patient's presenting urinary symptomatology.  Abdominal exam reveals soft, nontender, mildly distended and tympanic abdomen without hepatosplenomegaly, normoactive bowel sounds.  She does have upcoming follow-up visit with Dr. Army Melia, her PCP, for further evaluation management.  Over the interim, given antibiotic prescription, I have advised trial of OTC probiotics and she is amenable to this.  She was also advised to maintain adequate hydration  given the ongoing diarrhea.  Hemorrhagic cystitis 1 day history of dysuria, UA with leukocytes and blood.  Denies any fevers, chills, nausea, vomiting, does have chronic diarrhea, no flank pain.  Physical examination reveals no CVA tenderness or suprapubic tenderness, vitals are reassuring.  Given her listed drug allergies I have written for Macrobid x7 days, fluconazole for use if yeastlike symptoms develop, and we will send the urine for culture analysis.  We will contact the patient if any results dictate medication change.  She can otherwise follow-up on an as-needed basis if symptoms fail to improve.   Orders & Medications Meds ordered this encounter  Medications   nitrofurantoin, macrocrystal-monohydrate, (MACROBID) 100 MG capsule    Sig: Take 1 capsule (100 mg total) by mouth 2 (two) times daily.    Dispense:  14 capsule    Refill:  0   fluconazole (DIFLUCAN) 150 MG tablet    Sig: Take 1 tablet (150 mg total) by mouth once for 1 dose.    Dispense:  1 tablet    Refill:  2   Orders Placed This Encounter  Procedures   Urine Culture   POCT  Urinalysis Dipstick     Return if symptoms worsen or fail to improve.     Montel Culver, MD   Primary Care Sports Medicine [place]

## 2020-10-27 NOTE — Assessment & Plan Note (Signed)
1 day history of dysuria, UA with leukocytes and blood.  Denies any fevers, chills, nausea, vomiting, does have chronic diarrhea, no flank pain.  Physical examination reveals no CVA tenderness or suprapubic tenderness, vitals are reassuring.  Given her listed drug allergies I have written for Macrobid x7 days, fluconazole for use if yeastlike symptoms develop, and we will send the urine for culture analysis.  We will contact the patient if any results dictate medication change.  She can otherwise follow-up on an as-needed basis if symptoms fail to improve.

## 2020-11-01 LAB — URINE CULTURE

## 2020-11-12 NOTE — Progress Notes (Signed)
MRN : [id number]  Misty Villarreal is a 70 y.o. (Aug 21, 1950) female who presents with chief complaint of check circulation.  History of Present Illness:   The patient returns to the office for followup and review status post angiogram with intervention on 07/21/2018.    Procedure(s) Performed:             1.  Introduction catheter into left lower extremity 3rd order catheter placement              2.  Contrast injection left lower extremity for distal runoff             3.  Crosser atherectomy of the left SFA and popliteal arteries             4.   Percutaneous transluminal angioplasty left superficial femoral artery and popliteal             5.  Star close closure right common femoral arteriotomy   The patient notes her lower extremity symptoms are stable.  She recently went to a concert and was able to walk to the venue without problem, she notes she did have some problems walking back but it was still OK.  No interval shortening of the patient's claudication distance or rest pain symptoms. No new ulcers or wounds have occurred since the last visit.     There have been no significant changes to the patient's overall health care.   The patient denies amaurosis fugax or recent TIA symptoms. There are no recent neurological changes noted. The patient denies history of DVT, PE or superficial thrombophlebitis. The patient denies recent episodes of angina or shortness of breath.    ABI's Rt=1.01 and Lt=0.67 (previous ABI's Rt=0.94 and Lt=0.76).   Previous duplex ultrasound of the arterial system bilaterally shows the SFA intervention site on the right is patent and the left is occluded. No change compared to the last study.  No outpatient medications have been marked as taking for the 11/13/20 encounter (Appointment) with Delana Meyer, Dolores Lory, MD.    Past Medical History:  Diagnosis Date   Abnormal glandular Papanicolaou smear of cervix 09/01/2014   Normal Pap 2012    Anemia     Chronic kidney disease    stage 3   Depression    Diabetes mellitus without complication ([place])    DVT (deep venous thrombosis) (HCC)    Hyperlipidemia    Hypertension    Peripheral vascular disease ([place])    Peripheral vascular disease ([place])    Slurred speech 12/03/2016    Past Surgical History:  Procedure Laterality Date   ANGIOPLASTY / STENTING FEMORAL Left 2014, 2013   [place] Right 1989   benign   CARPAL TUNNEL RELEASE Left 1980   CATARACT EXTRACTION W/PHACO Right 09/02/2019   Procedure: CATARACT EXTRACTION PHACO AND INTRAOCULAR LENS PLACEMENT ([place]);  Surgeon: Eulogio Bear, MD;  Location: [facility];  Service: Ophthalmology;  Laterality: Right;  [place] 00:27.3 CDE 1.92 AP% Fluid Pack lot # [id number]   CATARACT EXTRACTION W/PHACO Left 10/18/2019   Procedure: CATARACT EXTRACTION PHACO AND INTRAOCULAR LENS PLACEMENT (IOC) LEFT DIABETIC 2.55  00:28.5;  Surgeon: Eulogio Bear, MD;  Location: [facility];  Service: Ophthalmology;  Laterality: Left;  Diabetic - oral meds   CESAREAN SECTION     CHOLECYSTECTOMY  1987   COLONOSCOPY WITH PROPOFOL N/A 05/08/2017   Procedure: COLONOSCOPY WITH PROPOFOL;  Surgeon: Jonathon Bellows, MD;  Location:  [facility];  Service: Gastroenterology;  Laterality: N/A;   KNEE ARTHROPLASTY Right 2009   LOWER EXTREMITY ANGIOGRAPHY Left 07/21/2018   Procedure: LOWER EXTREMITY ANGIOGRAPHY;  Surgeon: Katha Cabal, MD;  Location: [facility];  Service: Cardiovascular;  Laterality: Left;    Social History Social History   Tobacco Use   Smoking status: Former    Packs/day: 0.50    Years: 46.00    Pack years: 23.00    Types: Cigarettes    Quit date: 04/05/2013    Years since quitting: 7.6   Smokeless tobacco: Never  Vaping Use   Vaping Use: Never used  Substance Use Topics   Alcohol use: Yes    Alcohol/week: 2.0 standard drinks    Types: 2 Standard drinks or equivalent per week   Drug use:  Never    Family History Family History  Problem Relation Age of Onset   Diabetes Mother    Heart failure Father    Diabetes Father    Breast cancer Neg Hx     Allergies  Allergen Reactions   Augmentin [Amoxicillin-Pot Clavulanate] Diarrhea   Levaquin [Levofloxacin] Nausea And Vomiting and Other (See Comments)    Ankle pain   Tetracycline     Other reaction(s): emesis   Cefaclor Rash   Cephalexin Rash   Rosuvastatin Diarrhea   Sulfa Antibiotics Rash    Other reaction(s): emesis     REVIEW OF SYSTEMS (Negative unless checked)  Constitutional: [] Weight loss  [] Fever  [] Chills Cardiac: [] Chest pain   [] Chest pressure   [] Palpitations   [] Shortness of breath when laying flat   [] Shortness of breath with exertion. Vascular:  [x] Pain in legs with walking   [] Pain in legs at rest  [] History of DVT   [] Phlebitis   [] Swelling in legs   [] Varicose veins   [] Non-healing ulcers Pulmonary:   [] Uses home oxygen   [] Productive cough   [] Hemoptysis   [] Wheeze  [] COPD   [] Asthma Neurologic:  [] Dizziness   [] Seizures   [] History of stroke   [] History of TIA  [] Aphasia   [] Vissual changes   [] Weakness or numbness in arm   [] Weakness or numbness in leg Musculoskeletal:   [] Joint swelling   [] Joint pain   [] Low back pain Hematologic:  [] Easy bruising  [] Easy bleeding   [] Hypercoagulable state   [] Anemic Gastrointestinal:  [] Diarrhea   [] Vomiting  [] Gastroesophageal reflux/heartburn   [] Difficulty swallowing. Genitourinary:  [] Chronic kidney disease   [] Difficult urination  [] Frequent urination   [] Blood in urine Skin:  [] Rashes   [] Ulcers  Psychological:  [] History of anxiety   []  History of major depression.  Physical Examination  There were no vitals filed for this visit. There is no height or weight on file to calculate BMI. Gen: WD/WN, NAD Head: [place]/AT, No temporalis wasting.  Ear/Nose/Throat: Hearing grossly intact, nares w/o erythema or drainage Eyes: PER, EOMI, sclera nonicteric.   Neck: Supple, no masses.  No bruit or JVD.  Pulmonary:  Good air movement, no audible wheezing, no use of accessory muscles.  Cardiac: RRR, normal S1, S2, no Murmurs. Vascular:  scattered varicosities present bilaterally.  Mild venous stasis changes to the legs bilaterally.  2-3+ soft pitting edema  Vessel Right Left  Radial Palpable Palpable  Carotid Palpable Palpable  PT Not Palpable Not Palpable  DP Not Palpable Not Palpable  Gastrointestinal: soft, non-distended. No guarding/no peritoneal signs.  Musculoskeletal: M/S 5/5 throughout.  No visible deformity.  Neurologic: CN 2-12 intact. Pain and light touch  intact in extremities.  Symmetrical.  Speech is fluent. Motor exam as listed above. Psychiatric: Judgment intact, Mood & affect appropriate for pt's clinical situation. Dermatologic: No rashes or ulcers noted.  No changes consistent with cellulitis.   CBC Lab Results  Component Value Date   WBC 6.2 07/26/2020   HGB 10.8 (L) 07/26/2020   HCT 32.4 (L) 07/26/2020   MCV 88.8 07/26/2020   PLT 228 07/26/2020    BMET    Component Value Date/Time   NA 139 07/14/2020 1037   NA 139 04/27/2013 0713   K 5.0 07/14/2020 1037   K 4.2 04/27/2013 0713   CL 103 07/14/2020 1037   CL 108 (H) 04/27/2013 0713   CO2 19 (L) 07/14/2020 1037   CO2 25 04/27/2013 0713   GLUCOSE 152 (H) 07/14/2020 1037   GLUCOSE 155 (H) 06/28/2019 1041   GLUCOSE 137 (H) 04/27/2013 0713   BUN 17 07/14/2020 1037   BUN 21 (H) 04/27/2013 0713   CREATININE 1.32 (H) 07/14/2020 1037   CREATININE 1.28 04/27/2013 0713   CALCIUM 10.0 07/14/2020 1037   CALCIUM 9.3 04/27/2013 0713   GFRNONAA 37 (L) 06/28/2019 1041   GFRNONAA 44 (L) 04/27/2013 0713   GFRAA 43 (L) 06/28/2019 1041   GFRAA 52 (L) 04/27/2013 0713   CrCl cannot be calculated (Patient's most recent lab result is older than the maximum 21 days allowed.).  COAG Lab Results  Component Value Date   INR 0.85 12/03/2016    Radiology No results  found.   Assessment/Plan 1. Atherosclerosis of native artery of both lower extremities with intermittent claudication (HCC)  Recommend:  The patient has evidence of atherosclerosis of the lower extremities with claudication.  The patient does not voice lifestyle limiting changes at this point in time.  Noninvasive studies do not suggest clinically significant change.  No invasive studies, angiography or surgery at this time The patient should continue walking and begin a more formal exercise program.  The patient should continue antiplatelet therapy and aggressive treatment of the lipid abnormalities  No changes in the patient's medications at this time   - VAS [place] ABI WITH/WO TBI; Future  2. Lymphedema No surgery or intervention at this point in time.    I have reviewed my discussion with the patient regarding venous insufficiency and secondary lymph edema and why it  causes symptoms. I have discussed with the patient the chronic skin changes that accompany these problems and the long term sequela such as ulceration and infection.  Patient will continue wearing graduated compression stockings class 1 (20-30 mmHg) on a daily basis a prescription was given to the patient to keep this updated. The patient will  put the stockings on first thing in the morning and removing them in the evening. The patient is instructed specifically not to sleep in the stockings.  In addition, behavioral modification including elevation during the day will be continued.  Diet and salt restriction was also discussed.  Previous duplex ultrasound of the lower extremities shows normal deep venous system, superficial reflux was not present.   Following the review of the ultrasound the patient will follow up in 12 months to reassess the degree of swelling and the control that graduated compression is offering.   The patient can be assessed for a Lymph Pump at that time.  However, at this time the patient states they  are satisfied with the control compression and elevation is yielding.    3. Essential (primary) hypertension Continue antihypertensive medications as  already ordered, these medications have been reviewed and there are no changes at this time.   4. Type II diabetes mellitus with complication ([place]) Continue hypoglycemic medications as already ordered, these medications have been reviewed and there are no changes at this time.  Hgb A1C to be monitored as already arranged by primary service   5. Mixed hyperlipidemia Continue statin as ordered and reviewed, no changes at this time    Hortencia Pilar, MD  11/12/2020 11:35 AM

## 2020-11-13 ENCOUNTER — Other Ambulatory Visit (INDEPENDENT_AMBULATORY_CARE_PROVIDER_SITE_OTHER): Payer: Self-pay | Admitting: Vascular Surgery

## 2020-11-13 ENCOUNTER — Ambulatory Visit (INDEPENDENT_AMBULATORY_CARE_PROVIDER_SITE_OTHER): Payer: Medicare HMO

## 2020-11-13 ENCOUNTER — Other Ambulatory Visit: Payer: Self-pay

## 2020-11-13 ENCOUNTER — Ambulatory Visit (INDEPENDENT_AMBULATORY_CARE_PROVIDER_SITE_OTHER): Payer: Medicare HMO | Admitting: Vascular Surgery

## 2020-11-13 ENCOUNTER — Encounter (INDEPENDENT_AMBULATORY_CARE_PROVIDER_SITE_OTHER): Payer: Self-pay | Admitting: Vascular Surgery

## 2020-11-13 VITALS — BP 137/80 | HR 64 | Ht 62.0 in | Wt 184.0 lb

## 2020-11-13 DIAGNOSIS — Z9889 Other specified postprocedural states: Secondary | ICD-10-CM

## 2020-11-13 DIAGNOSIS — I70213 Atherosclerosis of native arteries of extremities with intermittent claudication, bilateral legs: Secondary | ICD-10-CM

## 2020-11-13 DIAGNOSIS — I739 Peripheral vascular disease, unspecified: Secondary | ICD-10-CM | POA: Diagnosis not present

## 2020-11-13 DIAGNOSIS — I890 Lymphedema, not elsewhere classified: Secondary | ICD-10-CM

## 2020-11-13 DIAGNOSIS — E118 Type 2 diabetes mellitus with unspecified complications: Secondary | ICD-10-CM | POA: Diagnosis not present

## 2020-11-13 DIAGNOSIS — I10 Essential (primary) hypertension: Secondary | ICD-10-CM | POA: Diagnosis not present

## 2020-11-13 DIAGNOSIS — E782 Mixed hyperlipidemia: Secondary | ICD-10-CM

## 2020-11-15 ENCOUNTER — Telehealth: Payer: Self-pay | Admitting: Oncology

## 2020-11-15 NOTE — Telephone Encounter (Signed)
Brooke, can you please reach out to pt to r/s appts. Labs prior. Thanks

## 2020-11-15 NOTE — Telephone Encounter (Signed)
Pt called in to reschedule her appt. Please give her a call at [phone]

## 2020-11-19 ENCOUNTER — Encounter (INDEPENDENT_AMBULATORY_CARE_PROVIDER_SITE_OTHER): Payer: Self-pay | Admitting: Vascular Surgery

## 2020-11-24 LAB — HM DIABETES EYE EXAM

## 2020-11-28 ENCOUNTER — Inpatient Hospital Stay: Payer: Medicare HMO | Attending: Oncology

## 2020-11-28 ENCOUNTER — Other Ambulatory Visit: Payer: Self-pay

## 2020-11-28 DIAGNOSIS — E1122 Type 2 diabetes mellitus with diabetic chronic kidney disease: Secondary | ICD-10-CM | POA: Diagnosis not present

## 2020-11-28 DIAGNOSIS — D631 Anemia in chronic kidney disease: Secondary | ICD-10-CM | POA: Diagnosis not present

## 2020-11-28 DIAGNOSIS — E538 Deficiency of other specified B group vitamins: Secondary | ICD-10-CM | POA: Insufficient documentation

## 2020-11-28 DIAGNOSIS — N1832 Chronic kidney disease, stage 3b: Secondary | ICD-10-CM | POA: Diagnosis not present

## 2020-11-28 DIAGNOSIS — I129 Hypertensive chronic kidney disease with stage 1 through stage 4 chronic kidney disease, or unspecified chronic kidney disease: Secondary | ICD-10-CM | POA: Diagnosis not present

## 2020-11-28 DIAGNOSIS — D801 Nonfamilial hypogammaglobulinemia: Secondary | ICD-10-CM | POA: Insufficient documentation

## 2020-11-28 DIAGNOSIS — J432 Centrilobular emphysema: Secondary | ICD-10-CM | POA: Insufficient documentation

## 2020-11-28 LAB — CBC WITH DIFFERENTIAL/PLATELET
Abs Immature Granulocytes: 0.02 10*3/uL (ref 0.00–0.07)
Basophils Absolute: 0.1 10*3/uL (ref 0.0–0.1)
Basophils Relative: 1 %
Eosinophils Absolute: 0.3 10*3/uL (ref 0.0–0.5)
Eosinophils Relative: 5 %
HCT: 34.9 % — ABNORMAL LOW (ref 36.0–46.0)
Hemoglobin: 11.6 g/dL — ABNORMAL LOW (ref 12.0–15.0)
Immature Granulocytes: 0 %
Lymphocytes Relative: 23 %
Lymphs Abs: 1.5 10*3/uL (ref 0.7–4.0)
MCH: 29.7 pg (ref 26.0–34.0)
MCHC: 33.2 g/dL (ref 30.0–36.0)
MCV: 89.3 fL (ref 80.0–100.0)
Monocytes Absolute: 0.5 10*3/uL (ref 0.1–1.0)
Monocytes Relative: 8 %
Neutro Abs: 4.1 10*3/uL (ref 1.7–7.7)
Neutrophils Relative %: 63 %
Platelets: 270 10*3/uL (ref 150–400)
RBC: 3.91 MIL/uL (ref 3.87–5.11)
RDW: 12.4 % (ref 11.5–15.5)
WBC: 6.4 10*3/uL (ref 4.0–10.5)
nRBC: 0 % (ref 0.0–0.2)

## 2020-11-28 LAB — RETIC PANEL
Immature Retic Fract: 11.3 % (ref 2.3–15.9)
RBC.: 3.89 MIL/uL (ref 3.87–5.11)
Retic Count, Absolute: 61.5 10*3/uL (ref 19.0–186.0)
Retic Ct Pct: 1.6 % (ref 0.4–3.1)
Reticulocyte Hemoglobin: 33.2 pg (ref >27.9)

## 2020-11-28 LAB — VITAMIN B12: Vitamin B-12: 230 pg/mL (ref 180–914)

## 2020-11-28 LAB — TECHNOLOGIST SMEAR REVIEW: Plt Morphology: NORMAL

## 2020-11-28 LAB — FERRITIN: Ferritin: 124 ng/mL (ref 11–307)

## 2020-11-28 LAB — IRON AND TIBC
Iron: 98 ug/dL (ref 28–170)
Saturation Ratios: 31 % (ref 10.4–31.8)
TIBC: 312 ug/dL (ref 250–450)
UIBC: 214 ug/dL

## 2020-11-29 ENCOUNTER — Inpatient Hospital Stay: Payer: Medicare HMO

## 2020-11-29 ENCOUNTER — Inpatient Hospital Stay: Payer: Medicare HMO | Admitting: Oncology

## 2020-11-29 ENCOUNTER — Encounter: Payer: Self-pay | Admitting: Oncology

## 2020-11-29 ENCOUNTER — Other Ambulatory Visit: Payer: Self-pay

## 2020-11-29 VITALS — BP 133/78 | HR 62 | Temp 96.9°F | Resp 17 | Wt 182.0 lb

## 2020-11-29 VITALS — BP 131/78 | HR 66

## 2020-11-29 DIAGNOSIS — N1832 Chronic kidney disease, stage 3b: Secondary | ICD-10-CM | POA: Diagnosis not present

## 2020-11-29 DIAGNOSIS — D631 Anemia in chronic kidney disease: Secondary | ICD-10-CM

## 2020-11-29 DIAGNOSIS — D509 Iron deficiency anemia, unspecified: Secondary | ICD-10-CM | POA: Diagnosis not present

## 2020-11-29 DIAGNOSIS — D801 Nonfamilial hypogammaglobulinemia: Secondary | ICD-10-CM | POA: Diagnosis not present

## 2020-11-29 DIAGNOSIS — E538 Deficiency of other specified B group vitamins: Secondary | ICD-10-CM

## 2020-11-29 MED ORDER — SODIUM CHLORIDE 0.9 % IV SOLN: 200.0000 mg | Freq: Once | INTRAVENOUS | Status: DC

## 2020-11-29 MED ORDER — IRON SUCROSE 20 MG/ML IV SOLN
200.0000 mg | Freq: Once | INTRAVENOUS | Status: AC
  Administered 2020-11-29: 200 mg via INTRAVENOUS
  Filled 2020-11-29: qty 10

## 2020-11-29 MED ORDER — SODIUM CHLORIDE 0.9 % IV SOLN
INTRAVENOUS | Status: DC
  Filled 2020-11-29: qty 250

## 2020-11-29 NOTE — Progress Notes (Signed)
Hematology/Oncology progress note [phone] [phone]      Clinic Day:  11/29/2020  Referring physician: Glean Hess, MD  Chief Complaint: Misty Villarreal is a 70 y.o. female presents to follow up for anemia in CKD, hypogammaglobulinemia   PERTINENT HEMATOLOGY HISTORY  Patient previously followed up by Dr.Corcoran, patient switched care to me on 07/27/20 Extensive medical record review was performed by me  #Anemia in chronic kidney disease. 11/11/2018  protein electrophoresis showed no M spike  Patient received IV Venofer treatments  #Vitamin B12 deficiency 11/11/2018, vitamin B12 was 104.   Patient is on oral vitamin B12 supplementation. Anti-parietal antibody and intrinsic factor antibody were normal on 12/14/2018.   #stage IIIB chronic kidney disease felt secondary to diabetes.  .  Patient follows up with nephrology Dr. Holley Raring.   05/08/2017 colonoscopy revealed diverticulosis and non-bleeding internal hemorrhoids.  #Former smoker, Low dose chest CT on 11/16/2019 revealed Lung-RADS 2S, benign appearance or behavior. There was mild diffuse bronchial wall thickening with very mild centrilobular and paraseptal emphysema; imaging findings suggestive of underlying COPD. There were calcifications of the aortic valve. Echocardiographic correlation for evaluation of potential valvular dysfunction may be warranted if clinically indicated.   INTERVAL HISTORY Misty Villarreal is a 70 y.o. female who has above history reviewed by me today presents for follow up visit for anemia Problems and complaints are listed below:  Patient has received IV Venofer treatments since last visit.  She reports improvement of her fatigue level for a while.  Recently she has had intermittent diarrhea and some days.  She is willing to see gastroenterology for work-up of that. She currently takes vitamin B12 500 MCG daily.  Past Medical History:  Diagnosis Date   Abnormal  glandular Papanicolaou smear of cervix 09/01/2014   Normal Pap 2012    Anemia    Chronic kidney disease    stage 3   Depression    Diabetes mellitus without complication (HCC)    DVT (deep venous thrombosis) (HCC)    Hyperlipidemia    Hypertension    Peripheral vascular disease (HCC)    Peripheral vascular disease ([place])    Slurred speech 12/03/2016    Past Surgical History:  Procedure Laterality Date   ANGIOPLASTY / STENTING FEMORAL Left 2014, 2013   [place]   BREAST BIOPSY Right 1989   benign   CARPAL TUNNEL RELEASE Left 1980   CATARACT EXTRACTION W/PHACO Right 09/02/2019   Procedure: CATARACT EXTRACTION PHACO AND INTRAOCULAR LENS PLACEMENT ([place]);  Surgeon: Eulogio Bear, MD;  Location: [facility];  Service: Ophthalmology;  Laterality: Right;  [place] 00:27.3 CDE 1.92 AP% Fluid Pack lot # [id number]   CATARACT EXTRACTION W/PHACO Left 10/18/2019   Procedure: CATARACT EXTRACTION PHACO AND INTRAOCULAR LENS PLACEMENT (I[facility]) LEFT DIABETIC 2.55  00:28.5;  Surgeon: Eulogio Bear, MD;  Location: [place] [facility];  Service: Ophthalmology;  Laterality: Left;  Diabetic - oral meds   CESAREAN SECTION     CHOLECYSTECTOMY  1987   COLONOSCOPY WITH PROPOFOL N/A 05/08/2017   Procedure: COLONOSCOPY WITH PROPOFOL;  Surgeon: Jonathon Bellows, MD;  [facility]: [facility];  Service: Gastroenterology;  Laterality: N/A;   KNEE ARTHROPLASTY Right 2009   LOWER EXTREMITY ANGIOGRAPHY Left 07/21/2018   Procedure: LOWER EXTREMITY ANGIOGRAPHY;  Surgeon: Katha Cabal, MD;  Location: [facility];  Service: Cardiovascular;  Laterality: Left;    Family History  Problem Relation Age of Onset   Diabetes Mother    Heart  failure Father    Diabetes Father    Breast cancer Neg Hx     Social History:  reports that she quit smoking about 7 years ago. Her smoking use included cigarettes. She has a 23.00 pack-year smoking history. She has never used smokeless tobacco. She reports  current alcohol use of about 2.0 standard drinks per week. She reports that she does not use drugs. She previously smoked a pack of cigarettes a day for 40 years.  She drinks red wine occasionally.    Her husband, [name], died in 2018/06/19.    Allergies:  Allergies  Allergen Reactions   Augmentin [Amoxicillin-Pot Clavulanate] Diarrhea   Levaquin [Levofloxacin] Nausea And Vomiting and Other (See Comments)    Ankle pain   Tetracycline     Other reaction(s): emesis   Cefaclor Rash   Cephalexin Rash   Rosuvastatin Diarrhea   Sulfa Antibiotics Rash    Other reaction(s): emesis    Current Medications: Current Outpatient Medications  Medication Sig Dispense Refill   albuterol (VENTOLIN HFA) 108 (90 Base) MCG/ACT inhaler Inhale 1 puff into the lungs as needed.     aspirin 81 MG chewable tablet Chew 81 mg by mouth daily.      atorvastatin (LIPITOR) 40 MG tablet TAKE 1 TABLET DAILY 90 tablet 0   Cholecalciferol (VITAMIN D) 50 MCG (2000 UT) CAPS Take 2 capsules by mouth daily.     clopidogrel (PLAVIX) 75 MG tablet TAKE 1 TABLET DAILY 90 tablet 3   Cyanocobalamin (VITAMIN B 12 PO) Take 1,000 mcg by mouth daily. 1079m daily     fexofenadine (ALLEGRA) 180 MG tablet Take 180 mg by mouth daily.     lisinopril (ZESTRIL) 20 MG tablet TAKE 1 TABLET DAILY (REPLACES LISINOPRIL HCT) 90 tablet 0   meclizine (ANTIVERT) 12.5 MG tablet TAKE 1 TABLET(12.5 MG) BY MOUTH TWICE DAILY AS NEEDED FOR DIZZINESS 60 tablet 1   metFORMIN (GLUCOPHAGE) 500 MG tablet TAKE 1 TABLET TWICE A DAY WITH MEALS 180 tablet 0   metoprolol tartrate (LOPRESSOR) 50 MG tablet Take 50 mg by mouth 2 (two) times daily.     Multiple Vitamin tablet Take 1 tablet by mouth daily.      sertraline (ZOLOFT) 50 MG tablet TAKE 1 TABLET DAILY 90 tablet 0   nitrofurantoin, macrocrystal-monohydrate, (MACROBID) 100 MG capsule Take 1 capsule (100 mg total) by mouth 2 (two) times daily. 14 capsule 0   No current facility-administered medications for this  visit.    Review of Systems  Constitutional:  Positive for malaise/fatigue (run down). Negative for chills, diaphoresis, fever and weight loss (up 5 lbs).       Feels "better" overall.  HENT:  Negative for congestion, ear discharge, ear pain, hearing loss, nosebleeds, sinus pain, sore throat and tinnitus.   Eyes:  Negative for blurred vision, double vision and photophobia.  Respiratory:  Negative for cough, hemoptysis, sputum production and shortness of breath.   Cardiovascular:  Negative for chest pain, palpitations, orthopnea and leg swelling.         On Plavix for peripheral vascular disease  Gastrointestinal:  Negative for abdominal pain, blood in stool, constipation, diarrhea (on Imodium), heartburn, melena, nausea and vomiting.       IBS.  No prior EGD.  Genitourinary:  Negative for dysuria, frequency, hematuria and urgency.  Musculoskeletal:  Positive for myalgias (leg cramps). Negative for back pain, joint pain and neck pain.       Chronic leg pain.  Skin:  Negative for  itching and rash.  Neurological: Negative.  Negative for dizziness, tingling, tremors, sensory change, speech change, focal weakness, weakness and headaches.  Endo/Heme/Allergies:  Negative for environmental allergies (on Allegra). Does not bruise/bleed easily.  Psychiatric/Behavioral:  Positive for depression. Negative for hallucinations and memory loss. The patient is not nervous/anxious and does not have insomnia.   Performance status (ECOG):  1  Vital Signs: Blood pressure 133/78, pulse 62, temperature (!) 96.9 F (36.1 C), temperature source Tympanic, resp. rate 17, weight 182 lb (82.6 kg), SpO2 99 %.   Physical Exam Vitals and nursing note reviewed.  Constitutional:      General: She is not in acute distress.    Appearance: She is well-developed. She is not diaphoretic.  HENT:     Head: Normocephalic and atraumatic.     Mouth/Throat:     Mouth: Mucous membranes are moist.     Pharynx: Oropharynx is  clear.  Eyes:     General: No scleral icterus.    Pupils: Pupils are equal, round, and reactive to light.  Cardiovascular:     Rate and Rhythm: Normal rate and regular rhythm.     Heart sounds: Normal heart sounds. No murmur heard. Pulmonary:     Effort: Pulmonary effort is normal. No respiratory distress.     Breath sounds: Normal breath sounds. No wheezing or rales.  Chest:     Chest wall: No tenderness.  Abdominal:     General: Bowel sounds are normal. There is no distension.     Palpations: Abdomen is soft. There is no hepatomegaly, splenomegaly or mass.     Tenderness: There is no abdominal tenderness. There is no right CVA tenderness, guarding or rebound.  Musculoskeletal:        General: No swelling or tenderness. Normal range of motion.     Cervical back: Normal range of motion and neck supple.  Lymphadenopathy:     Head:     Right side of head: No preauricular, posterior auricular or occipital adenopathy.     Left side of head: No preauricular, posterior auricular or occipital adenopathy.     Cervical: No cervical adenopathy.     Upper Body:     Right upper body: No supraclavicular or axillary adenopathy.     Left upper body: No supraclavicular or axillary adenopathy.     Lower Body: No right inguinal adenopathy. No left inguinal adenopathy.  Skin:    General: Skin is warm and dry.  Neurological:     Mental Status: She is alert and oriented to person, place, and time.  Psychiatric:        Mood and Affect: Mood normal.   Appointment on 11/28/2020  Component Date Value Ref Range Status   Vitamin B-12 11/28/2020 230  180 - 914 pg/mL Final   Comment: (NOTE) This assay is not validated for testing neonatal or myeloproliferative syndrome specimens for Vitamin B12 levels. Performed at [facility], [place] [address] [place], [place] [address]    WBC 11/28/2020 6.4  4.0 - 10.5 K/uL Final   RBC 11/28/2020 3.91  3.87 - 5.11 MIL/uL Final   Hemoglobin 11/28/2020 11.6  (A)  12.0 - 15.0 g/dL Final   HCT 11/28/2020 34.9 (A)  36.0 - 46.0 % Final   MCV 11/28/2020 89.3  80.0 - 100.0 fL Final   MCH 11/28/2020 29.7  26.0 - 34.0 pg Final   MCHC 11/28/2020 33.2  30.0 - 36.0 g/dL Final   RDW 11/28/2020 12.4  11.5 -  15.5 % Final   Platelets 11/28/2020 270  150 - 400 K/uL Final   nRBC 11/28/2020 0.0  0.0 - 0.2 % Final   Neutrophils Relative % 11/28/2020 63  % Final   Neutro Abs 11/28/2020 4.1  1.7 - 7.7 K/uL Final   Lymphocytes Relative 11/28/2020 23  % Final   Lymphs Abs 11/28/2020 1.5  0.7 - 4.0 K/uL Final   Monocytes Relative 11/28/2020 8  % Final   Monocytes Absolute 11/28/2020 0.5  0.1 - 1.0 K/uL Final   Eosinophils Relative 11/28/2020 5  % Final   Eosinophils Absolute 11/28/2020 0.3  0.0 - 0.5 K/uL Final   Basophils Relative 11/28/2020 1  % Final   Basophils Absolute 11/28/2020 0.1  0.0 - 0.1 K/uL Final   Immature Granulocytes 11/28/2020 0  % Final   Abs Immature Granulocytes 11/28/2020 0.02  0.00 - 0.07 K/uL Final   Performed at [facility], [address], [place], [place] [address]   Retic Ct Pct 11/28/2020 1.6  0.4 - 3.1 % Final   RBC. 11/28/2020 3.89  3.87 - 5.11 MIL/uL Final   Retic Count, Absolute 11/28/2020 61.5  19.0 - 186.0 K/uL Final   Immature Retic Fract 11/28/2020 11.3  2.3 - 15.9 % Final   Reticulocyte Hemoglobin 11/28/2020 33.2  >27.9 pg Final   Comment:        Given the high negative predictive value of a RET-He result > 32 pg iron deficiency is essentially excluded. If this patient is anemic other etiologies should be considered. Performed at [facility], [address], [place], [place] [address]    Ferritin 11/28/2020 124  11 - 307 ng/mL Final   Performed at [facility], [address] [place], [place] [address]   Iron 11/28/2020 98  28 - 170 ug/dL Final   TIBC 11/28/2020 312  250 - 450 ug/dL Final   Saturation Ratios 11/28/2020 31  10.4 - 31.8 % Final   UIBC 11/28/2020 214  ug/dL Final    Performed at [facility], [address] [place], [place] [address]   WBC MORPHOLOGY 11/28/2020 MORPHOLOGY UNREMARKABLE   Final   RBC MORPHOLOGY 11/28/2020 MORPHOLOGY UNREMARKABLE   Final   Tech Review 11/28/2020 Normal platelet morphology   Final   Performed at [facility], [address], [place], [place] [address]    Assessment and assessment 1. Anemia due to stage 3b chronic kidney disease ([place])   2. B12 deficiency   3. Iron deficiency anemia, unspecified iron deficiency anemia type    #Anemia due to chronic kidney disease. Labs are reviewed and discussed with patient Reviewed hemoglobin has improved to 11.6. Iron panel has improved, ferritin is 125.  recommend patient to proceed with other dose of IV Venofer today.  # B12 deficiency Vitamin B12 level has decreased to 280 Recommend patient to resume vitamin B12 1000 MCG daily.  #Diarrhea, history of IBS.  Follow-up with gastroenterology.  RTC in 6  months for labs (CBC with diff, cmp , ferritin, iron studies B12).  I discussed the assessment and treatment plan with the patient.  The patient was provided an opportunity to ask questions and all were answered.  The patient agreed with the plan and demonstrated an understanding of the instructions.  The patient was advised to call back if the symptoms worsen or if the condition fails to improve as anticipated.  [name], MD, PhD [facility] Hematology Oncology 11/29/2020

## 2020-11-29 NOTE — Progress Notes (Signed)
No new concerns today 

## 2020-11-30 ENCOUNTER — Ambulatory Visit: Payer: Medicare HMO

## 2020-11-30 ENCOUNTER — Ambulatory Visit: Payer: Medicare HMO | Admitting: Oncology

## 2020-12-01 ENCOUNTER — Encounter: Payer: Self-pay | Admitting: Internal Medicine

## 2020-12-01 ENCOUNTER — Ambulatory Visit (INDEPENDENT_AMBULATORY_CARE_PROVIDER_SITE_OTHER): Payer: Medicare HMO | Admitting: Internal Medicine

## 2020-12-01 ENCOUNTER — Other Ambulatory Visit: Payer: Self-pay

## 2020-12-01 VITALS — BP 120/68 | HR 67 | Ht 62.0 in | Wt 179.4 lb

## 2020-12-01 DIAGNOSIS — N1832 Chronic kidney disease, stage 3b: Secondary | ICD-10-CM | POA: Diagnosis not present

## 2020-12-01 DIAGNOSIS — Z0000 Encounter for general adult medical examination without abnormal findings: Secondary | ICD-10-CM

## 2020-12-01 DIAGNOSIS — Z1231 Encounter for screening mammogram for malignant neoplasm of breast: Secondary | ICD-10-CM

## 2020-12-01 DIAGNOSIS — E1169 Type 2 diabetes mellitus with other specified complication: Secondary | ICD-10-CM | POA: Diagnosis not present

## 2020-12-01 DIAGNOSIS — E118 Type 2 diabetes mellitus with unspecified complications: Secondary | ICD-10-CM | POA: Diagnosis not present

## 2020-12-01 DIAGNOSIS — K529 Noninfective gastroenteritis and colitis, unspecified: Secondary | ICD-10-CM

## 2020-12-01 DIAGNOSIS — F324 Major depressive disorder, single episode, in partial remission: Secondary | ICD-10-CM

## 2020-12-01 DIAGNOSIS — E785 Hyperlipidemia, unspecified: Secondary | ICD-10-CM

## 2020-12-01 DIAGNOSIS — I10 Essential (primary) hypertension: Secondary | ICD-10-CM

## 2020-12-01 DIAGNOSIS — D801 Nonfamilial hypogammaglobulinemia: Secondary | ICD-10-CM

## 2020-12-01 LAB — POCT URINALYSIS DIPSTICK
Bilirubin, UA: NEGATIVE
Blood, UA: NEGATIVE
Glucose, UA: NEGATIVE
Ketones, UA: NEGATIVE
Leukocytes, UA: NEGATIVE
Nitrite, UA: NEGATIVE
Protein, UA: POSITIVE — AB
Spec Grav, UA: >=1.03 — AB (ref 1.010–1.025)
Urobilinogen, UA: 0.2 E.U./dL
pH, UA: 6 (ref 5.0–8.0)

## 2020-12-01 MED ORDER — CHOLESTYRAMINE LIGHT 4 G PO PACK: 4.0000 g | PACK | Freq: Two times a day (BID) | ORAL | 0 refills | Status: DC

## 2020-12-01 MED ORDER — ALBUTEROL SULFATE HFA 108 (90 BASE) MCG/ACT IN AERS: 2.0000 | INHALATION_SPRAY | Freq: Four times a day (QID) | RESPIRATORY_TRACT | 0 refills | Status: AC | PRN

## 2020-12-01 NOTE — Progress Notes (Signed)
Date:  12/01/2020   Name:  Misty Villarreal   DOB:  1950-10-20   MRN:  [id number]   Chief Complaint: Annual Exam (Breast Exam. No pap. ) Misty Villarreal is a 70 y.o. female who presents today for her Complete Annual Exam. She feels well. She reports exercising - not much. She reports she is sleeping well. Breast complaints - none.  Mammogram: 11/2019 DEXA: 01/2018 osteopenia hip/normal spine Pap smear: discontinued Colonoscopy: 04/2017  Immunization History  Administered Date(s) Administered   Fluad Quad(high Dose 65+) 10/23/2020   Influenza, High Dose Seasonal PF 11/13/2016, 11/12/2018   Influenza-Unspecified 11/21/2016, 12/18/2017, 11/22/2019   PFIZER(Purple Top)SARS-COV-2 Vaccination 03/24/2019, 04/14/2019   Pneumococcal Conjugate-13 08/04/2015   Pneumococcal Polysaccharide-23 12/15/2012, 09/09/2018   Zoster Recombinat (Shingrix) 12/04/2017, 11/12/2018    Diabetes She presents for her follow-up diabetic visit. She has type 2 diabetes mellitus. Her disease course has been stable. Pertinent negatives for hypoglycemia include no dizziness, headaches, nervousness/anxiousness or tremors. Pertinent negatives for diabetes include no chest pain, no fatigue, no polydipsia and no polyuria. Pertinent negatives for diabetic complications include no CVA. Current diabetic treatment includes oral agent (monotherapy). She is compliant with treatment all of the time. Her weight is stable. Her breakfast blood glucose is taken between 6-7 am. Her breakfast blood glucose range is generally 140-180 mg/dl. An ACE inhibitor/angiotensin II receptor blocker is being taken.  Hypertension This is a chronic problem. The problem is controlled. Pertinent negatives include no chest pain, headaches, palpitations or shortness of breath. Past treatments include ACE inhibitors and beta blockers. There is no history of kidney disease, CAD/MI or CVA.  Hyperlipidemia This is a chronic problem. The problem is  controlled. Pertinent negatives include no chest pain or shortness of breath. Current antihyperlipidemic treatment includes statins. The current treatment provides significant improvement of lipids.  Diarrhea  This is a chronic problem. The problem occurs 2 to 4 times per day. The problem has been unchanged. The stool consistency is described as Watery. The patient states that diarrhea does not awaken her from sleep. Pertinent negatives include no abdominal pain, arthralgias, chills, coughing, fever, headaches or vomiting. She has tried anti-motility drug for the symptoms. The treatment provided mild relief.  Depression        This is a chronic problem.  The problem has been resolved since onset.  Associated symptoms include no fatigue and no headaches.  Past treatments include SSRIs - Selective serotonin reuptake inhibitors.  Compliance with treatment is good.  Lab Results  Component Value Date   CREATININE 1.32 (H) 07/14/2020   BUN 17 07/14/2020   NA 139 07/14/2020   K 5.0 07/14/2020   CL 103 07/14/2020   CO2 19 (L) 07/14/2020   Lab Results  Component Value Date   CHOL 166 07/14/2020   HDL 46 07/14/2020   LDLCALC 79 07/14/2020   LDLDIRECT 76 07/14/2020   TRIG 250 (H) 07/14/2020   CHOLHDL 3.6 07/14/2020   Lab Results  Component Value Date   TSH 2.575 09/06/2019   Lab Results  Component Value Date   HGBA1C 7.2 (H) 07/14/2020   Lab Results  Component Value Date   WBC 6.4 11/28/2020   HGB 11.6 (L) 11/28/2020   HCT 34.9 (L) 11/28/2020   MCV 89.3 11/28/2020   PLT 270 11/28/2020   Lab Results  Component Value Date   ALT 19 07/14/2020   AST 14 07/14/2020   ALKPHOS 101 07/14/2020   BILITOT 0.6 07/14/2020  Review of Systems  Constitutional:  Negative for chills, fatigue and fever.  HENT:  Negative for congestion, hearing loss, tinnitus, trouble swallowing and voice change.   Eyes:  Negative for visual disturbance.  Respiratory:  Negative for cough, chest tightness,  shortness of breath and wheezing.   Cardiovascular:  Negative for chest pain, palpitations and leg swelling.  Gastrointestinal:  Positive for diarrhea. Negative for abdominal pain, constipation and vomiting.  Endocrine: Negative for polydipsia and polyuria.  Genitourinary:  Negative for dysuria, frequency, genital sores, vaginal bleeding and vaginal discharge.  Musculoskeletal:  Negative for arthralgias, gait problem and joint swelling.  Skin:  Negative for color change and rash.  Neurological:  Negative for dizziness, tremors, light-headedness and headaches.  Hematological:  Negative for adenopathy. Does not bruise/bleed easily.  Psychiatric/Behavioral:  Positive for depression. Negative for dysphoric mood and sleep disturbance. The patient is not nervous/anxious.    Patient Active Problem List   Diagnosis Date Noted   Hemorrhagic cystitis 10/27/2020   Chronic diarrhea 10/27/2020   Atherosclerosis of native arteries of extremity with intermittent claudication ([place]) 05/15/2020   Palpitations 03/29/2020   Stable angina pectoris ([place]) 03/29/2020   Aortic atherosclerosis ([place]) 11/19/2019   Hyperlipidemia 11/15/2019   Other fatigue 09/07/2019   Stage 3b chronic kidney disease ([place]) 12/23/2018   Restless leg syndrome 12/23/2018   Unruptured synovial cyst of popliteal space 12/23/2018   Plantar fasciitis of left foot 12/23/2018   Iron deficiency anemia 11/26/2018   B12 deficiency 11/26/2018   Hypogammaglobulinemia ([place]) 11/11/2018   Anemia due to stage 3b chronic kidney disease ([place]) 11/11/2018   Osteopenia determined by x-ray 02/16/2018   Major depressive disorder with single episode, in partial remission ([place]) 08/04/2017   Neck pain on right side 08/05/2016   Tinnitus of right ear 08/05/2016   Type II diabetes mellitus with complication ([place]) 34/74/2595   Lymphedema 06/21/2015   History of paroxysmal supraventricular tachycardia 06/21/2015   PAD (peripheral artery disease) ([place])  02/02/2015   Hyperlipidemia associated with type 2 diabetes mellitus ([place]) 09/01/2014   Neuropathy 09/01/2014   Phlebectasia 09/01/2014   Essential (primary) hypertension 09/01/2014   Spondylolisthesis at L4-L5 level 07/05/2014   Arthritis, degenerative 01/31/2014    Allergies  Allergen Reactions   Augmentin [Amoxicillin-Pot Clavulanate] Diarrhea   Levaquin [Levofloxacin] Nausea And Vomiting and Other (See Comments)    Ankle pain   Tetracycline     Other reaction(s): emesis   Cefaclor Rash   Cephalexin Rash   Rosuvastatin Diarrhea   Sulfa Antibiotics Rash    Other reaction(s): emesis    Past Surgical History:  Procedure Laterality Date   ANGIOPLASTY / STENTING FEMORAL Left 2014, 2013   BRAIN MENINGIOMA EXCISION  1988   BREAST BIOPSY Right 1989   benign   CARPAL TUNNEL RELEASE Left 1980   CATARACT EXTRACTION W/PHACO Right 09/02/2019   Procedure: CATARACT EXTRACTION PHACO AND INTRAOCULAR LENS PLACEMENT ([place]);  Surgeon: Eulogio Bear, MD;  Location: [facility];  Service: Ophthalmology;  Laterality: Right;  [place] 00:27.3 CDE 1.92 AP% Fluid Pack lot # [id number]   CATARACT EXTRACTION W/PHACO Left 10/18/2019   Procedure: CATARACT EXTRACTION PHACO AND INTRAOCULAR LENS PLACEMENT ([facility]) LEFT DIABETIC 2.55  00:28.5;  Surgeon: Eulogio Bear, MD;  Location: [place];  Service: Ophthalmology;  Laterality: Left;  Diabetic - oral meds   CESAREAN SECTION     CHOLECYSTECTOMY  1987   COLONOSCOPY WITH PROPOFOL N/A 05/08/2017   Procedure: COLONOSCOPY WITH PROPOFOL;  Surgeon: Jonathon Bellows, MD;  Location: [facility];  Service: Gastroenterology;  Laterality: N/A;   KNEE ARTHROPLASTY Right 2009   LOWER EXTREMITY ANGIOGRAPHY Left 07/21/2018   Procedure: LOWER EXTREMITY ANGIOGRAPHY;  Surgeon: Katha Cabal, MD;  Location: [facility];  Service: Cardiovascular;  Laterality: Left;    Social History   Tobacco Use   Smoking status: Former    Packs/day: 0.50    Years:  46.00    Pack years: 23.00    Types: Cigarettes    Quit date: 04/05/2013    Years since quitting: 7.6   Smokeless tobacco: Never  Vaping Use   Vaping Use: Never used  Substance Use Topics   Alcohol use: Yes    Alcohol/week: 2.0 standard drinks    Types: 2 Standard drinks or equivalent per week   Drug use: Never     Medication list has been reviewed and updated.  Current Meds  Medication Sig   albuterol (VENTOLIN HFA) 108 (90 Base) MCG/ACT inhaler Inhale 1 puff into the lungs as needed.   aspirin 81 MG chewable tablet Chew 81 mg by mouth daily.    atorvastatin (LIPITOR) 40 MG tablet TAKE 1 TABLET DAILY   Cholecalciferol (VITAMIN D) 50 MCG (2000 UT) CAPS Take 2 capsules by mouth daily.   clopidogrel (PLAVIX) 75 MG tablet TAKE 1 TABLET DAILY   Cyanocobalamin (VITAMIN B 12 PO) Take 1,000 mcg by mouth daily. 1000mg  daily   fexofenadine (ALLEGRA) 180 MG tablet Take 180 mg by mouth daily.   lisinopril (ZESTRIL) 20 MG tablet TAKE 1 TABLET DAILY (REPLACES LISINOPRIL HCT)   meclizine (ANTIVERT) 12.5 MG tablet TAKE 1 TABLET(12.5 MG) BY MOUTH TWICE DAILY AS NEEDED FOR DIZZINESS   metFORMIN (GLUCOPHAGE) 500 MG tablet TAKE 1 TABLET TWICE A DAY WITH MEALS   metoprolol tartrate (LOPRESSOR) 50 MG tablet Take 50 mg by mouth 2 (two) times daily.   Multiple Vitamin tablet Take 1 tablet by mouth daily.    sertraline (ZOLOFT) 50 MG tablet TAKE 1 TABLET DAILY    PHQ 2/9 Scores 12/01/2020 10/27/2020 10/23/2020 03/14/2020  PHQ - 2 Score 0 0 0 0  PHQ- 9 Score 0 0 - 0    GAD 7 : Generalized Anxiety Score 12/01/2020 10/27/2020 03/14/2020 01/11/2020  Nervous, Anxious, on Edge 0 0 0 0  Control/stop worrying 0 0 0 0  Worry too much - different things 0 0 0 0  Trouble relaxing 0 0 0 0  Restless 0 0 0 0  Easily annoyed or irritable 0 0 0 0  Afraid - awful might happen 0 0 0 0  Total GAD 7 Score 0 0 0 0  Anxiety Difficulty Not difficult at all Not difficult at all Not difficult at all Not difficult at all     BP Readings from Last 3 Encounters:  12/01/20 120/68  11/29/20 131/78  11/29/20 133/78    Physical Exam Vitals and nursing note reviewed.  Constitutional:      General: She is not in acute distress.    Appearance: She is well-developed.  HENT:     Head: Normocephalic and atraumatic.     Right Ear: Tympanic membrane and ear canal normal.     Left Ear: Tympanic membrane and ear canal normal.     Nose:     Right Sinus: No maxillary sinus tenderness.     Left Sinus: No maxillary sinus tenderness.  Eyes:     General: No scleral icterus.       Right eye: No discharge.  Left eye: No discharge.     Conjunctiva/sclera: Conjunctivae normal.  Neck:     Thyroid: No thyromegaly.     Vascular: No carotid bruit.  Cardiovascular:     Rate and Rhythm: Normal rate and regular rhythm.     Pulses: Normal pulses.     Heart sounds: Normal heart sounds.  Pulmonary:     Effort: Pulmonary effort is normal. No respiratory distress.     Breath sounds: No wheezing.  Chest:  Breasts:    Right: No mass, nipple discharge, skin change or tenderness.     Left: No mass, nipple discharge, skin change or tenderness.  Abdominal:     General: Bowel sounds are normal.     Palpations: Abdomen is soft.     Tenderness: There is no abdominal tenderness.  Musculoskeletal:     Cervical back: Normal range of motion. No erythema.     Right lower leg: No edema.     Left lower leg: No edema.  Lymphadenopathy:     Cervical: No cervical adenopathy.  Skin:    General: Skin is warm and dry.     Findings: No rash.  Neurological:     Mental Status: She is alert and oriented to person, place, and time.     Cranial Nerves: No cranial nerve deficit.     Sensory: No sensory deficit.     Deep Tendon Reflexes: Reflexes are normal and symmetric.  Psychiatric:        Attention and Perception: Attention normal.        Mood and Affect: Mood normal.    Wt Readings from Last 3 Encounters:  12/01/20 179 lb 6.4  oz (81.4 kg)  11/29/20 182 lb (82.6 kg)  11/13/20 184 lb (83.5 kg)    BP 120/68   Pulse 67   Ht 5\' 2"  (1.575 m)   Wt 179 lb 6.4 oz (81.4 kg)   LMP  (LMP Unknown)   SpO2 97%   BMI 32.81 kg/m   Assessment and Plan: 1. Annual physical exam Exam is normal except for weight. Encourage regular exercise and appropriate dietary changes. Up to date on screenings and immunizations.  2. Encounter for screening mammogram for breast cancer Schedule at [facility]  3. Essential (primary) hypertension Clinically stable exam with well controlled BP. Tolerating medications without side effects at this time. Pt to continue current regimen and low sodium diet; benefits of regular exercise as able discussed. - CBC with Differential/Platelet - TSH - POCT urinalysis dipstick  4. Type II diabetes mellitus with complication (HCC) Glucoses are running higher for unclear reason. She has doubled the metformin to 1000 mg bid with no change. She has ordered a new meter. - Comprehensive metabolic panel - Hemoglobin A1c  5. Hyperlipidemia associated with type 2 diabetes mellitus ([place]) Tolerating statin medication without side effects at this time LDL is at goal of < 70 on current dose Continue same therapy without change at this time. - Lipid panel  6. Stage 3b chronic kidney disease ([place]) Check labs  7. Chronic diarrhea S/p cholecystectomy; minimal response to imodium.  Will try Questran. - cholestyramine light (PREVALITE) 4 g packet; Take 1 packet (4 g total) by mouth 2 (two) times daily.  Dispense: 180 packet; Refill: 0 - Ambulatory referral to Gastroenterology  8. Hypogammaglobulinemia ([place]) Followed by Hem-Onc  9. Major depressive disorder with single episode, in partial remission (HCC) Clinically stable on current regimen with good control of symptoms, No SI or HI. Will continue current  therapy.   Partially dictated using Editor, commissioning. Any errors are unintentional.  Halina Maidens,  MD [facility]  12/01/2020

## 2020-12-02 LAB — CBC WITH DIFFERENTIAL/PLATELET
Basophils Absolute: 0.1 10*3/uL (ref 0.0–0.2)
Basos: 1 %
EOS (ABSOLUTE): 0.3 10*3/uL (ref 0.0–0.4)
Eos: 3 %
Hematocrit: 38.7 % (ref 34.0–46.6)
Hemoglobin: 13 g/dL (ref 11.1–15.9)
Immature Grans (Abs): 0 10*3/uL (ref 0.0–0.1)
Immature Granulocytes: 0 %
Lymphocytes Absolute: 1.6 10*3/uL (ref 0.7–3.1)
Lymphs: 18 %
MCH: 29.6 pg (ref 26.6–33.0)
MCHC: 33.6 g/dL (ref 31.5–35.7)
MCV: 88 fL (ref 79–97)
Monocytes Absolute: 0.7 10*3/uL (ref 0.1–0.9)
Monocytes: 8 %
Neutrophils Absolute: 6 10*3/uL (ref 1.4–7.0)
Neutrophils: 70 %
Platelets: 327 10*3/uL (ref 150–450)
RBC: 4.39 x10E6/uL (ref 3.77–5.28)
RDW: 12.3 % (ref 11.7–15.4)
WBC: 8.7 10*3/uL (ref 3.4–10.8)

## 2020-12-02 LAB — COMPREHENSIVE METABOLIC PANEL
ALT: 16 IU/L (ref 0–32)
AST: 18 IU/L (ref 0–40)
Albumin/Globulin Ratio: 1.8 (ref 1.2–2.2)
Albumin: 4.6 g/dL (ref 3.7–4.7)
Alkaline Phosphatase: 95 IU/L (ref 44–121)
BUN/Creatinine Ratio: 13 (ref 12–28)
BUN: 18 mg/dL (ref 8–27)
Bilirubin Total: 0.8 mg/dL (ref 0.0–1.2)
CO2: 20 mmol/L (ref 20–29)
Calcium: 10.1 mg/dL (ref 8.7–10.3)
Chloride: 105 mmol/L (ref 96–106)
Creatinine, Ser: 1.39 mg/dL — ABNORMAL HIGH (ref 0.57–1.00)
Globulin, Total: 2.5 g/dL (ref 1.5–4.5)
Glucose: 132 mg/dL — ABNORMAL HIGH (ref 70–99)
Potassium: 5.6 mmol/L — ABNORMAL HIGH (ref 3.5–5.2)
Sodium: 140 mmol/L (ref 134–144)
Total Protein: 7.1 g/dL (ref 6.0–8.5)
eGFR: 41 mL/min/{1.73_m2} — ABNORMAL LOW (ref >59)

## 2020-12-02 LAB — TSH: TSH: 3.64 u[IU]/mL (ref 0.450–4.500)

## 2020-12-02 LAB — LIPID PANEL
Chol/HDL Ratio: 3.4 ratio (ref 0.0–4.4)
Cholesterol, Total: 176 mg/dL (ref 100–199)
HDL: 52 mg/dL (ref >39)
LDL Chol Calc (NIH): 79 mg/dL (ref 0–99)
Triglycerides: 275 mg/dL — ABNORMAL HIGH (ref 0–149)
VLDL Cholesterol Cal: 45 mg/dL — ABNORMAL HIGH (ref 5–40)

## 2020-12-02 LAB — HEMOGLOBIN A1C
Est. average glucose Bld gHb Est-mCnc: 146 mg/dL
Hgb A1c MFr Bld: 6.7 % — ABNORMAL HIGH (ref 4.8–5.6)

## 2020-12-06 ENCOUNTER — Other Ambulatory Visit: Payer: Self-pay | Admitting: Internal Medicine

## 2020-12-06 DIAGNOSIS — E782 Mixed hyperlipidemia: Secondary | ICD-10-CM

## 2020-12-06 DIAGNOSIS — E118 Type 2 diabetes mellitus with unspecified complications: Secondary | ICD-10-CM

## 2020-12-06 DIAGNOSIS — I10 Essential (primary) hypertension: Secondary | ICD-10-CM

## 2020-12-06 NOTE — Telephone Encounter (Signed)
Requested Prescriptions  Pending Prescriptions Disp Refills  . lisinopril (ZESTRIL) 20 MG tablet [Pharmacy Med Name: LISINOPRIL TABS 20MG] 90 tablet 1    Sig: TAKE 1 TABLET DAILY (REPLACES LISINOPRIL HCT)     Cardiovascular:  ACE Inhibitors Failed - 12/06/2020  1:41 PM      Failed - Cr in normal range and within 180 days    Creatinine  Date Value Ref Range Status  04/27/2013 1.28 0.60 - 1.30 mg/dL Final   Creatinine, Ser  Date Value Ref Range Status  12/01/2020 1.39 (H) 0.57 - 1.00 mg/dL Final   Creatinine, Urine  Date Value Ref Range Status  06/28/2019 189 mg/dL Final         Failed - K in normal range and within 180 days    Potassium  Date Value Ref Range Status  12/01/2020 5.6 (H) 3.5 - 5.2 mmol/L Final  04/27/2013 4.2 3.5 - 5.1 mmol/L Final         Passed - Patient is not pregnant      Passed - Last BP in normal range    BP Readings from Last 1 Encounters:  12/01/20 120/68         Passed - Valid encounter within last 6 months    Recent Outpatient Visits          5 days ago Annual physical exam   [facility] Glean Hess, MD   1 month ago Hemorrhagic cystitis   [facility] Montel Culver, MD   4 months ago Community acquired pneumonia of right upper lobe of lung   [facility] Glean Hess, MD   8 months ago Pneumonia due to COVID-19 virus   [facility] Glean Hess, MD   11 months ago [facility] Glean Hess, MD      Future Appointments            In 3 months Army Melia Jesse Sans, MD [facility], [place]           . metFORMIN (GLUCOPHAGE) 500 MG tablet [Pharmacy Med Name: METFORMIN HCL TABS 500MG] 180 tablet 3    Sig: TAKE 1 TABLET TWICE A DAY WITH MEALS     Endocrinology:  Diabetes - Biguanides Failed - 12/06/2020  1:41 PM      Failed - Cr in normal range and within 360 days    Creatinine  Date Value Ref Range Status  04/27/2013 1.28 0.60 - 1.30 mg/dL Final    Creatinine, Ser  Date Value Ref Range Status  12/01/2020 1.39 (H) 0.57 - 1.00 mg/dL Final   Creatinine, Urine  Date Value Ref Range Status  06/28/2019 189 mg/dL Final         Failed - eGFR in normal range and within 360 days    EGFR (African American)  Date Value Ref Range Status  04/27/2013 52 (L)  Final   GFR calc Af Amer  Date Value Ref Range Status  06/28/2019 43 (L) >60 mL/min Final   EGFR (Non-African Amer.)  Date Value Ref Range Status  04/27/2013 44 (L)  Final    Comment:    eGFR values <9m/min/1.73 m2 may be an indication of chronic kidney disease (CKD). Calculated eGFR is useful in patients with stable renal function. The eGFR calculation will not be reliable in acutely ill patients when serum creatinine is changing rapidly. It is not useful in  patients on dialysis. The eGFR calculation may not  be applicable to patients at the low and high extremes of body sizes, pregnant women, and vegetarians.    GFR calc non Af Amer  Date Value Ref Range Status  06/28/2019 37 (L) >60 mL/min Final   eGFR  Date Value Ref Range Status  12/01/2020 41 (L) >59 mL/min/1.73 Final         Passed - HBA1C is between 0 and 7.9 and within 180 days    Hgb A1c MFr Bld  Date Value Ref Range Status  12/01/2020 6.7 (H) 4.8 - 5.6 % Final    Comment:             Prediabetes: 5.7 - 6.4          Diabetes: >6.4          Glycemic control for adults with diabetes: <7.0          Passed - Valid encounter within last 6 months    Recent Outpatient Visits          5 days ago Annual physical exam   [facility] Berglund, Laura H, MD   1 month ago Hemorrhagic cystitis   [facility] Matthews, Jason J, MD   4 months ago Community acquired pneumonia of right upper lobe of lung   [facility] Berglund, Laura H, MD   8 months ago Pneumonia due to COVID-19 virus   [facility] Berglund, Laura H, MD   11 months ago Tachycardia   [facility]  Berglund, Laura H, MD      Future Appointments            In 3 months Berglund, Laura H, MD [facility], [facility]           . atorvastatin (LIPITOR) 40 MG tablet [Pharmacy Med Name: ATORVASTATIN TABS 40MG] 90 tablet 3    Sig: TAKE 1 TABLET DAILY     Cardiovascular:  Antilipid - Statins Failed - 12/06/2020  1:41 PM      Failed - Triglycerides in normal range and within 360 days    Triglycerides  Date Value Ref Range Status  12/01/2020 275 (H) 0 - 149 mg/dL Final         Passed - Total Cholesterol in normal range and within 360 days    Cholesterol, Total  Date Value Ref Range Status  12/01/2020 176 100 - 199 mg/dL Final         Passed - LDL in normal range and within 360 days    LDL Chol Calc (NIH)  Date Value Ref Range Status  12/01/2020 79 0 - 99 mg/dL Final   Direct LDL  Date Value Ref Range Status  09/13/2019 181.1 (H) 0 - 99 mg/dL Final    Comment:    Performed at [facility], [address] [place], [place] [address]   LDL Direct  Date Value Ref Range Status  07/14/2020 76 0 - 99 mg/dL Final         Passed - HDL in normal range and within 360 days    HDL  Date Value Ref Range Status  12/01/2020 52 >39 mg/dL Final         Passed - Patient is not pregnant      Passed - Valid encounter within last 12 months    Recent Outpatient Visits          5 days ago Annual physical exam   [facility] Berglund, Laura H, MD     1 month ago Hemorrhagic cystitis   [facility] Montel Culver, MD   4 months ago Community acquired pneumonia of right upper lobe of lung   [facility] Glean Hess, MD   8 months ago Pneumonia due to COVID-19 virus   [facility] Glean Hess, MD   11 months ago [facility] Glean Hess, MD      Future Appointments            In 3 months Army Melia Jesse Sans, MD [facility], [facility]

## 2020-12-12 ENCOUNTER — Encounter: Payer: Self-pay | Admitting: Internal Medicine

## 2021-01-11 ENCOUNTER — Other Ambulatory Visit: Payer: Self-pay | Admitting: Internal Medicine

## 2021-01-11 DIAGNOSIS — F32A Depression, unspecified: Secondary | ICD-10-CM

## 2021-01-11 NOTE — Telephone Encounter (Signed)
Requested Prescriptions  Pending Prescriptions Disp Refills   sertraline (ZOLOFT) 50 MG tablet [Pharmacy Med Name: SERTRALINE HCL TABS 50MG ] 90 tablet 3    Sig: TAKE 1 TABLET DAILY (HAS UPCOMING APPOINTMENT PRIOR TO FURTHER REFILLS)     Psychiatry:  Antidepressants - SSRI Passed - 01/11/2021 12:09 AM      Passed - Completed PHQ-2 or PHQ-9 in the last 360 days      Passed - Valid encounter within last 6 months    Recent Outpatient Visits          1 month ago Annual physical exam   [facility] Glean Hess, MD   2 months ago Hemorrhagic cystitis   [facility] Montel Culver, MD   6 months ago Community acquired pneumonia of right upper lobe of lung   [facility] Glean Hess, MD   10 months ago Pneumonia due to COVID-19 virus   [facility] Glean Hess, MD   1 year ago [facility] Glean Hess, MD      Future Appointments            In 2 months Army Melia Jesse Sans, MD [facility], [facility]

## 2021-01-30 ENCOUNTER — Encounter: Payer: Self-pay | Admitting: Hematology and Oncology

## 2021-01-31 ENCOUNTER — Ambulatory Visit
Admission: RE | Admit: 2021-01-31 | Discharge: 2021-01-31 | Disposition: A | Discharge status: Home / Self Care | Payer: Medicare Other | Source: Ambulatory Visit | Attending: Internal Medicine | Admitting: Internal Medicine

## 2021-01-31 ENCOUNTER — Other Ambulatory Visit: Payer: Self-pay

## 2021-01-31 DIAGNOSIS — Z1231 Encounter for screening mammogram for malignant neoplasm of breast: Secondary | ICD-10-CM | POA: Insufficient documentation

## 2021-03-16 ENCOUNTER — Other Ambulatory Visit: Payer: Self-pay

## 2021-03-16 ENCOUNTER — Telehealth: Payer: Self-pay

## 2021-03-16 ENCOUNTER — Telehealth: Payer: Self-pay | Admitting: Acute Care

## 2021-03-16 NOTE — Telephone Encounter (Signed)
Attempted to reach pt to schedule annual LDCT.  Unable to reach pt. LVMM

## 2021-03-26 ENCOUNTER — Encounter: Payer: Self-pay | Admitting: Hematology and Oncology

## 2021-04-02 ENCOUNTER — Ambulatory Visit: Payer: Medicare Other | Admitting: Internal Medicine

## 2021-04-02 ENCOUNTER — Other Ambulatory Visit: Payer: Self-pay

## 2021-04-02 ENCOUNTER — Encounter: Payer: Self-pay | Admitting: Internal Medicine

## 2021-04-02 VITALS — BP 124/78 | HR 71 | Ht 62.0 in | Wt 182.0 lb

## 2021-04-02 DIAGNOSIS — I10 Essential (primary) hypertension: Secondary | ICD-10-CM

## 2021-04-02 DIAGNOSIS — E118 Type 2 diabetes mellitus with unspecified complications: Secondary | ICD-10-CM | POA: Diagnosis not present

## 2021-04-02 DIAGNOSIS — N1832 Chronic kidney disease, stage 3b: Secondary | ICD-10-CM

## 2021-04-02 DIAGNOSIS — R197 Diarrhea, unspecified: Secondary | ICD-10-CM

## 2021-04-02 MED ORDER — DAPAGLIFLOZIN PROPANEDIOL 10 MG PO TABS
10.0000 mg | ORAL_TABLET | Freq: Every day | ORAL | 1 refills | Status: DC
Start: 2021-04-02 — End: 2021-05-04

## 2021-04-02 NOTE — Progress Notes (Signed)
Date:  04/02/2021   Name:  Misty Villarreal   DOB:  1951-01-08   MRN:  [id number]   Chief Complaint: Diabetes  Diabetes She presents for her follow-up diabetic visit. She has type 2 diabetes mellitus. Her disease course has been stable. Pertinent negatives for hypoglycemia include no headaches, nervousness/anxiousness or tremors. There are no diabetic associated symptoms. Pertinent negatives for diabetes include no chest pain, no fatigue, no polydipsia and no polyuria. Current diabetic treatment includes oral agent (monotherapy). She is compliant with treatment all of the time. There is no change in her home blood glucose trend. Her breakfast blood glucose is taken between 6-7 am. Her breakfast blood glucose range is generally 130-140 mg/dl.  Diarrhea  This is a recurrent problem. The problem occurs 5 to 10 times per day. The problem has been waxing and waning. The stool consistency is described as Watery. The patient states that diarrhea does not awaken her from sleep. Pertinent negatives include no abdominal pain, arthralgias, coughing, fever or headaches. Treatments tried: [place].   Lab Results  Component Value Date   NA 140 12/01/2020   K 5.6 (H) 12/01/2020   CO2 20 12/01/2020   GLUCOSE 132 (H) 12/01/2020   BUN 18 12/01/2020   CREATININE 1.39 (H) 12/01/2020   CALCIUM 10.1 12/01/2020   EGFR 41 (L) 12/01/2020   GFRNONAA 37 (L) 06/28/2019   Lab Results  Component Value Date   CHOL 176 12/01/2020   HDL 52 12/01/2020   LDLCALC 79 12/01/2020   LDLDIRECT 76 07/14/2020   TRIG 275 (H) 12/01/2020   CHOLHDL 3.4 12/01/2020   Lab Results  Component Value Date   TSH 3.640 12/01/2020   Lab Results  Component Value Date   HGBA1C 6.7 (H) 12/01/2020   Lab Results  Component Value Date   WBC 8.7 12/01/2020   HGB 13.0 12/01/2020   HCT 38.7 12/01/2020   MCV 88 12/01/2020   PLT 327 12/01/2020   Lab Results  Component Value Date   ALT 16 12/01/2020   AST 18 12/01/2020    ALKPHOS 95 12/01/2020   BILITOT 0.8 12/01/2020   Lab Results  Component Value Date   VD25OH 21.79 (L) 09/13/2019     Review of Systems  Constitutional:  Negative for appetite change, fatigue, fever and unexpected weight change.  HENT:  Negative for tinnitus and trouble swallowing.   Eyes:  Negative for visual disturbance.  Respiratory:  Negative for cough, chest tightness and shortness of breath.   Cardiovascular:  Negative for chest pain, palpitations and leg swelling.  Gastrointestinal:  Positive for diarrhea. Negative for abdominal pain.  Endocrine: Negative for polydipsia and polyuria.  Genitourinary:  Negative for dysuria and hematuria.  Musculoskeletal:  Negative for arthralgias.  Neurological:  Negative for tremors, numbness and headaches.  Psychiatric/Behavioral:  Negative for dysphoric mood and sleep disturbance. The patient is not nervous/anxious.    Patient Active Problem List   Diagnosis Date Noted   Hemorrhagic cystitis 10/27/2020   Chronic diarrhea 10/27/2020   Atherosclerosis of native arteries of extremity with intermittent claudication ([place]) 05/15/2020   Palpitations 03/29/2020   Stable angina pectoris ([place]) 03/29/2020   Aortic atherosclerosis ([place]) 11/19/2019   Hyperlipidemia 11/15/2019   Other fatigue 09/07/2019   Stage 3b chronic kidney disease ([place]) 12/23/2018   Restless leg syndrome 12/23/2018   Unruptured synovial cyst of popliteal space 12/23/2018   Plantar fasciitis of left foot 12/23/2018   Iron deficiency anemia 11/26/2018   B12 deficiency 11/26/2018  Hypogammaglobulinemia ([place]) 11/11/2018   Anemia due to stage 3b chronic kidney disease ([place]) 11/11/2018   Osteopenia determined by x-ray 02/16/2018   Major depressive disorder with single episode, in partial remission ([place]) 08/04/2017   Neck pain on right side 08/05/2016   Tinnitus of right ear 08/05/2016   Type II diabetes mellitus with complication ([place]) 79/43/2761   Lymphedema 06/21/2015    History of paroxysmal supraventricular tachycardia 06/21/2015   PAD (peripheral artery disease) ([place]) 02/02/2015   Hyperlipidemia associated with type 2 diabetes mellitus ([place]) 09/01/2014   Neuropathy 09/01/2014   Phlebectasia 09/01/2014   Essential (primary) hypertension 09/01/2014   Spondylolisthesis at L4-L5 level 07/05/2014   Arthritis, degenerative 01/31/2014    Allergies  Allergen Reactions   Augmentin [Amoxicillin-Pot Clavulanate] Diarrhea   Levaquin [Levofloxacin] Nausea And Vomiting and Other (See Comments)    Ankle pain   Tetracycline     Other reaction(s): emesis   Cefaclor Rash   Cephalexin Rash   Rosuvastatin Diarrhea   Sulfa Antibiotics Rash    Other reaction(s): emesis    Past Surgical History:  Procedure Laterality Date   ANGIOPLASTY / STENTING FEMORAL Left 2014, 2013   BRAIN MENINGIOMA EXCISION  1988   BREAST BIOPSY Right 1989   benign   CARPAL TUNNEL RELEASE Left 1980   CATARACT EXTRACTION W/PHACO Right 09/02/2019   Procedure: CATARACT EXTRACTION PHACO AND INTRAOCULAR LENS PLACEMENT ([place]);  Surgeon: Eulogio Bear, MD;  Location: [facility] ORS;  Service: Ophthalmology;  Laterality: Right;  [place] 00:27.3 CDE 1.92 AP% Fluid Pack lot # [id number]   CATARACT EXTRACTION W/PHACO Left 10/18/2019   Procedure: CATARACT EXTRACTION PHACO AND INTRAOCULAR LENS PLACEMENT (IOC) LEFT DIABETIC 2.55  00:28.5;  Surgeon: Eulogio Bear, MD;  Location: [place];  Service: Ophthalmology;  Laterality: Left;  Diabetic - oral meds   CESAREAN SECTION     CHOLECYSTECTOMY  1987   COLONOSCOPY WITH PROPOFOL N/A 05/08/2017   Procedure: COLONOSCOPY WITH PROPOFOL;  Surgeon: Jonathon Bellows, MD;  Location: [facility] ENDOSCOPY;  Service: Gastroenterology;  Laterality: N/A;   KNEE ARTHROPLASTY Right 2009   LOWER EXTREMITY ANGIOGRAPHY Left 07/21/2018   Procedure: LOWER EXTREMITY ANGIOGRAPHY;  Surgeon: Katha Cabal, MD;  Location: [place] [facility];  Service: Cardiovascular;  Laterality:  Left;    Social History   Tobacco Use   Smoking status: Former    Packs/day: 0.50    Years: 46.00    Pack years: 23.00    Types: Cigarettes    Quit date: 04/05/2013    Years since quitting: 7.9   Smokeless tobacco: Never  Vaping Use   Vaping Use: Never used  Substance Use Topics   Alcohol use: Yes    Alcohol/week: 2.0 standard drinks    Types: 2 Standard drinks or equivalent per week   Drug use: Never     Medication list has been reviewed and updated.  Current Meds  Medication Sig   albuterol (VENTOLIN HFA) 108 (90 Base) MCG/ACT inhaler Inhale 2 puffs into the lungs every 6 (six) hours as needed.   aspirin 81 MG chewable tablet Chew 81 mg by mouth daily.    atorvastatin (LIPITOR) 40 MG tablet TAKE 1 TABLET DAILY   Cholecalciferol (VITAMIN D) 50 MCG (2000 UT) CAPS Take 2 capsules by mouth daily.   clopidogrel (PLAVIX) 75 MG tablet TAKE 1 TABLET DAILY   Cyanocobalamin (VITAMIN B 12 PO) Take 1,000 mcg by mouth daily. 1050m daily   fexofenadine (ALLEGRA) 180 MG tablet Take 180 mg by  mouth daily.   lisinopril (ZESTRIL) 20 MG tablet TAKE 1 TABLET DAILY (REPLACES LISINOPRIL HCT)   meclizine (ANTIVERT) 12.5 MG tablet TAKE 1 TABLET(12.5 MG) BY MOUTH TWICE DAILY AS NEEDED FOR DIZZINESS   metFORMIN (GLUCOPHAGE) 500 MG tablet TAKE 1 TABLET TWICE A DAY WITH MEALS   metoprolol tartrate (LOPRESSOR) 50 MG tablet Take 50 mg by mouth 2 (two) times daily.   Multiple Vitamin tablet Take 1 tablet by mouth daily.    sertraline (ZOLOFT) 50 MG tablet TAKE 1 TABLET DAILY (HAS UPCOMING APPOINTMENT PRIOR TO FURTHER REFILLS)    PHQ 2/9 Scores 04/02/2021 12/01/2020 10/27/2020 10/23/2020  PHQ - 2 Score 0 0 0 0  PHQ- 9 Score 1 0 0 -    GAD 7 : Generalized Anxiety Score 04/02/2021 12/01/2020 10/27/2020 03/14/2020  Nervous, Anxious, on Edge 0 0 0 0  Control/stop worrying 0 0 0 0  Worry too much - different things 0 0 0 0  Trouble relaxing 0 0 0 0  Restless 0 0 0 0  Easily annoyed or irritable 0 0 0 0   Afraid - awful might happen 0 0 0 0  Total GAD 7 Score 0 0 0 0  Anxiety Difficulty Not difficult at all Not difficult at all Not difficult at all Not difficult at all    BP Readings from Last 3 Encounters:  04/02/21 124/78  12/01/20 120/68  11/29/20 131/78    Physical Exam Vitals and nursing note reviewed.  Constitutional:      General: She is not in acute distress.    Appearance: Normal appearance. She is well-developed.  HENT:     Head: Normocephalic and atraumatic.  Cardiovascular:     Rate and Rhythm: Normal rate and regular rhythm.     Pulses: Normal pulses.     Heart sounds: No murmur heard. Pulmonary:     Effort: Pulmonary effort is normal. No respiratory distress.     Breath sounds: No wheezing or rhonchi.  Abdominal:     Tenderness: There is no abdominal tenderness.  Musculoskeletal:     Cervical back: Normal range of motion.     Right lower leg: No edema.     Left lower leg: No edema.  Lymphadenopathy:     Cervical: No cervical adenopathy.  Skin:    General: Skin is warm and dry.     Capillary Refill: Capillary refill takes less than 2 seconds.     Findings: No rash.  Neurological:     General: No focal deficit present.     Mental Status: She is alert and oriented to person, place, and time.  Psychiatric:        Mood and Affect: Mood normal.        Behavior: Behavior normal.    Wt Readings from Last 3 Encounters:  04/02/21 182 lb (82.6 kg)  12/01/20 179 lb 6.4 oz (81.4 kg)  11/29/20 182 lb (82.6 kg)    BP 124/78    Pulse 71    Ht '5\' 2"'  (1.575 m)    Wt 182 lb (82.6 kg)    LMP  (LMP Unknown)    SpO2 95%    BMI 33.29 kg/m   Assessment and Plan: 1. Type II diabetes mellitus with complication (HCC) Clinically stable by exam and report without s/s of hypoglycemia. DM complicated by hypertension and dyslipidemia. However, intermittent diarrhea may be due to metformin.  Will stop metformin and begin Farxiga 10 mg - sample and Rx sent. - Hemoglobin A1c -  Microalbumin / creatinine urine ratio - dapagliflozin propanediol (FARXIGA) 10 MG TABS tablet; Take 1 tablet (10 mg total) by mouth daily before breakfast.  Dispense: 90 tablet; Refill: 1  2. Diarrhea, unspecified type Did not respond to [place] Will stop metformin as above. Still trying to get in with GI  3. Essential (primary) hypertension Clinically stable exam with well controlled BP. Tolerating medications without side effects at this time. Pt to continue current regimen and low sodium diet; benefits of regular exercise as able discussed.  4. Stage 3b chronic kidney disease ([place]) Has been stable - starting Farxiga may be protective as well. - Basic metabolic panel   Partially dictated using Editor, commissioning. Any errors are unintentional.  Halina Maidens, MD [facility]  04/02/2021

## 2021-04-02 NOTE — Patient Instructions (Addendum)
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 ? ?Stop metformin and begin [place] once a day. ?Use the samples while we send the Rx and do any prior authorization that is needed. ?

## 2021-04-03 LAB — BASIC METABOLIC PANEL
BUN/Creatinine Ratio: 25 (ref 12–28)
BUN: 35 mg/dL — ABNORMAL HIGH (ref 8–27)
CO2: 20 mmol/L (ref 20–29)
Calcium: 10.3 mg/dL (ref 8.7–10.3)
Chloride: 101 mmol/L (ref 96–106)
Creatinine, Ser: 1.4 mg/dL — ABNORMAL HIGH (ref 0.57–1.00)
Glucose: 154 mg/dL — ABNORMAL HIGH (ref 70–99)
Potassium: 5 mmol/L (ref 3.5–5.2)
Sodium: 139 mmol/L (ref 134–144)
eGFR: 40 mL/min/{1.73_m2} — ABNORMAL LOW (ref >59)

## 2021-04-03 LAB — HEMOGLOBIN A1C
Est. average glucose Bld gHb Est-mCnc: 157 mg/dL
Hgb A1c MFr Bld: 7.1 % — ABNORMAL HIGH (ref 4.8–5.6)

## 2021-04-04 ENCOUNTER — Encounter: Payer: Self-pay | Admitting: Internal Medicine

## 2021-04-04 ENCOUNTER — Other Ambulatory Visit: Payer: Self-pay | Admitting: Internal Medicine

## 2021-04-04 DIAGNOSIS — E118 Type 2 diabetes mellitus with unspecified complications: Secondary | ICD-10-CM

## 2021-04-04 MED ORDER — BLOOD GLUCOSE MONITOR KIT: PACK | 0 refills | Status: AC

## 2021-04-09 LAB — MICROALBUMIN / CREATININE URINE RATIO: Microalb Creat Ratio: 17

## 2021-04-09 LAB — PROTEIN / CREATININE RATIO, URINE: Creatinine, Urine: 243

## 2021-04-09 LAB — MICROALBUMIN, URINE: Microalb, Ur: 4.1

## 2021-04-10 LAB — MICROALBUMIN / CREATININE URINE RATIO

## 2021-04-20 ENCOUNTER — Encounter: Payer: Self-pay | Admitting: Internal Medicine

## 2021-04-20 ENCOUNTER — Telehealth (INDEPENDENT_AMBULATORY_CARE_PROVIDER_SITE_OTHER): Payer: Medicare Other | Admitting: Internal Medicine

## 2021-04-20 VITALS — Ht 62.0 in

## 2021-04-20 DIAGNOSIS — U071 COVID-19: Secondary | ICD-10-CM

## 2021-04-20 MED ORDER — MOLNUPIRAVIR EUA 200MG CAPSULE: 4.0000 | ORAL_CAPSULE | Freq: Two times a day (BID) | ORAL | 0 refills | Status: AC

## 2021-04-20 MED ORDER — PROMETHAZINE-DM 6.25-15 MG/5ML PO SYRP: 5.0000 mL | ORAL_SOLUTION | Freq: Four times a day (QID) | ORAL | 0 refills | Status: DC | PRN

## 2021-04-20 NOTE — Progress Notes (Signed)
? ? ?Date:  04/20/2021  ? ?Name:  Misty Villarreal   DOB:  06/05/1950   MRN:  [id number] ? ?This encounter was conducted via video encounter due to the need for social distancing in light of the Covid-19 pandemic.  The patient was correctly identified.  I advised that I am conducting the visit from a secure room in my office at [facility] clinic.  The patient is located at home. The limitations of this form of encounter were discussed with the patient and he/she agreed to proceed.  Some vital signs will be absent.  The connection via video was terminated early due to loss of audio. ? ?Chief Complaint: Covid Positive (Home test positive 04/19/21) ? ?URI  ?This is a new problem. Episode onset: 4 days. The problem has been gradually worsening. There has been no fever. Associated symptoms include congestion, coughing, headaches, joint pain, rhinorrhea, sinus pain and a sore throat. She has tried acetaminophen and decongestant for the symptoms. The treatment provided mild relief.  ? ?Lab Results  ?Component Value Date  ? NA 139 04/02/2021  ? K 5.0 04/02/2021  ? CO2 20 04/02/2021  ? GLUCOSE 154 (H) 04/02/2021  ? BUN 35 (H) 04/02/2021  ? CREATININE 1.40 (H) 04/02/2021  ? CALCIUM 10.3 04/02/2021  ? EGFR 40 (L) 04/02/2021  ? GFRNONAA 37 (L) 06/28/2019  ? ?Lab Results  ?Component Value Date  ? CHOL 176 12/01/2020  ? HDL 52 12/01/2020  ? [place] 79 12/01/2020  ? LDLDIRECT 76 07/14/2020  ? TRIG 275 (H) 12/01/2020  ? CHOLHDL 3.4 12/01/2020  ? ?Lab Results  ?Component Value Date  ? TSH 3.640 12/01/2020  ? ?Lab Results  ?Component Value Date  ? HGBA1C 7.1 (H) 04/02/2021  ? ?Lab Results  ?Component Value Date  ? WBC 8.7 12/01/2020  ? HGB 13.0 12/01/2020  ? HCT 38.7 12/01/2020  ? MCV 88 12/01/2020  ? PLT 327 12/01/2020  ? ?Lab Results  ?Component Value Date  ? ALT 16 12/01/2020  ? AST 18 12/01/2020  ? ALKPHOS 95 12/01/2020  ? BILITOT 0.8 12/01/2020  ? ?Lab Results  ?Component Value Date  ? VD25OH 21.79 (L) 09/13/2019  ?   ? ?Review of Systems  ?Constitutional:  Positive for fatigue. Negative for fever.  ?HENT:  Positive for congestion, rhinorrhea, sinus pain, sore throat and voice change.   ?Respiratory:  Positive for cough.   ?Musculoskeletal:  Positive for joint pain and myalgias.  ?Neurological:  Positive for headaches.  ? ?Patient Active Problem List  ? Diagnosis Date Noted  ? Hemorrhagic cystitis 10/27/2020  ? Chronic diarrhea 10/27/2020  ? Atherosclerosis of native arteries of extremity with intermittent claudication ([place]) 05/15/2020  ? Palpitations 03/29/2020  ? Stable angina pectoris ([place]) 03/29/2020  ? Aortic atherosclerosis ([place]) 11/19/2019  ? Hyperlipidemia 11/15/2019  ? Other fatigue 09/07/2019  ? Stage 3b chronic kidney disease ([place]) 12/23/2018  ? Restless leg syndrome 12/23/2018  ? Unruptured synovial cyst of popliteal space 12/23/2018  ? Plantar fasciitis of left foot 12/23/2018  ? Iron deficiency anemia 11/26/2018  ? B12 deficiency 11/26/2018  ? Hypogammaglobulinemia ([place]) 11/11/2018  ? Anemia due to stage 3b chronic kidney disease ([place]) 11/11/2018  ? Osteopenia determined by x-ray 02/16/2018  ? Major depressive disorder with single episode, in partial remission ([place]) 08/04/2017  ? Neck pain on right side 08/05/2016  ? Tinnitus of right ear 08/05/2016  ? Type II diabetes mellitus with complication ([place]) 47/42/5956  ? Lymphedema 06/21/2015  ? History of  paroxysmal supraventricular tachycardia 06/21/2015  ? PAD (peripheral artery disease) ([place]) 02/02/2015  ? Hyperlipidemia associated with type 2 diabetes mellitus ([place]) 09/01/2014  ? Neuropathy 09/01/2014  ? Phlebectasia 09/01/2014  ? Essential (primary) hypertension 09/01/2014  ? Spondylolisthesis at L4-L5 level 07/05/2014  ? Arthritis, degenerative 01/31/2014  ? ? ?Allergies  ?Allergen Reactions  ? Augmentin [Amoxicillin-Pot Clavulanate] Diarrhea  ? Levaquin [Levofloxacin] Nausea And Vomiting and Other (See Comments)  ?  Ankle pain  ? Tetracycline   ?  Other  reaction(s): emesis  ? Cefaclor Rash  ? Cephalexin Rash  ? Rosuvastatin Diarrhea  ? Sulfa Antibiotics Rash  ?  Other reaction(s): emesis  ? ? ?Past Surgical History:  ?Procedure Laterality Date  ? ANGIOPLASTY / STENTING FEMORAL Left 2014, 2013  ? BRAIN MENINGIOMA EXCISION  1988  ? BREAST BIOPSY Right 1989  ? benign  ? CARPAL TUNNEL RELEASE Left 1980  ? CATARACT EXTRACTION W/PHACO Right 09/02/2019  ? Procedure: CATARACT EXTRACTION PHACO AND INTRAOCULAR LENS PLACEMENT (IOC);  Surgeon: Eulogio Bear, MD;  Location: [facility] ORS;  Service: Ophthalmology;  Laterality: Right;  [place] 00:27.3 ?CDE 1.92 ?AP% Fluid Pack lot # [id number]  ? CATARACT EXTRACTION W/PHACO Left 10/18/2019  ? Procedure: CATARACT EXTRACTION PHACO AND INTRAOCULAR LENS PLACEMENT (IOC) LEFT DIABETIC 2.55  00:28.5;  Surgeon: Eulogio Bear, MD;  Location: [place];  Service: Ophthalmology;  Laterality: Left;  Diabetic - oral meds  ? CESAREAN SECTION    ? CHOLECYSTECTOMY  1987  ? COLONOSCOPY WITH PROPOFOL N/A 05/08/2017  ? Procedure: COLONOSCOPY WITH PROPOFOL;  Surgeon: Jonathon Bellows, MD;  Location: [facility] ENDOSCOPY;  Service: Gastroenterology;  Laterality: N/A;  ? KNEE ARTHROPLASTY Right 2009  ? LOWER EXTREMITY ANGIOGRAPHY Left 07/21/2018  ? Procedure: LOWER EXTREMITY ANGIOGRAPHY;  Surgeon: Katha Cabal, MD;  Location: [place] [facility];  Service: Cardiovascular;  Laterality: Left;  ? ? ?Social History  ? ?Tobacco Use  ? Smoking status: Former  ?  Packs/day: 0.50  ?  Years: 46.00  ?  Pack years: 23.00  ?  Types: Cigarettes  ?  Quit date: 04/05/2013  ?  Years since quitting: 8.0  ? Smokeless tobacco: Never  ?Vaping Use  ? Vaping Use: Never used  ?Substance Use Topics  ? Alcohol use: Yes  ?  Alcohol/week: 2.0 standard drinks  ?  Types: 2 Standard drinks or equivalent per week  ? Drug use: Never  ? ? ? ?Medication list has been reviewed and updated. ? ?Current Meds  ?Medication Sig  ? albuterol (VENTOLIN HFA) 108 (90 Base) MCG/ACT inhaler Inhale 2  puffs into the lungs every 6 (six) hours as needed.  ? aspirin 81 MG chewable tablet Chew 81 mg by mouth daily.   ? atorvastatin (LIPITOR) 40 MG tablet TAKE 1 TABLET DAILY  ? blood glucose meter kit and supplies KIT Dispense based on patient and insurance preference. Use to check BS bid  ? Cholecalciferol (VITAMIN D) 50 MCG (2000 UT) CAPS Take 2 capsules by mouth daily.  ? clopidogrel (PLAVIX) 75 MG tablet TAKE 1 TABLET DAILY  ? Cyanocobalamin (VITAMIN B 12 PO) Take 1,000 mcg by mouth daily. 1022m daily  ? dapagliflozin propanediol (FARXIGA) 10 MG TABS tablet Take 1 tablet (10 mg total) by mouth daily before breakfast.  ? fexofenadine (ALLEGRA) 180 MG tablet Take 180 mg by mouth daily.  ? lisinopril (ZESTRIL) 20 MG tablet TAKE 1 TABLET DAILY (REPLACES LISINOPRIL HCT)  ? meclizine (ANTIVERT) 12.5 MG tablet TAKE 1 TABLET(12.5 MG)  BY MOUTH TWICE DAILY AS NEEDED FOR DIZZINESS  ? metoprolol tartrate (LOPRESSOR) 50 MG tablet Take 50 mg by mouth 2 (two) times daily.  ? Multiple Vitamin tablet Take 1 tablet by mouth daily.   ? sertraline (ZOLOFT) 50 MG tablet TAKE 1 TABLET DAILY (HAS UPCOMING APPOINTMENT PRIOR TO FURTHER REFILLS)  ? ? ? ?  04/20/2021  ? 10:54 AM 04/02/2021  ? 10:59 AM 12/01/2020  ? 10:35 AM 10/27/2020  ?  4:22 PM  ?GAD 7 : Generalized Anxiety Score  ?Nervous, Anxious, on Edge 0 0 0 0  ?Control/stop worrying 0 0 0 0  ?Worry too much - different things 0 0 0 0  ?Trouble relaxing 0 0 0 0  ?Restless 0 0 0 0  ?Easily annoyed or irritable 0 0 0 0  ?Afraid - awful might happen 0 0 0 0  ?Total GAD 7 Score 0 0 0 0  ?Anxiety Difficulty  Not difficult at all Not difficult at all Not difficult at all  ? ? ? ?  04/20/2021  ? 10:54 AM  ?Depression screen PHQ 2/9  ?Decreased Interest 0  ?Down, Depressed, Hopeless 0  ?PHQ - 2 Score 0  ?Altered sleeping 1  ?Tired, decreased energy 1  ?Change in appetite 0  ?Feeling bad or failure about yourself  0  ?Trouble concentrating 0  ?Moving slowly or fidgety/restless 0  ?Suicidal  thoughts 0  ?PHQ-9 Score 2  ? ? ?BP Readings from Last 3 Encounters:  ?04/02/21 124/78  ?12/01/20 120/68  ?11/29/20 131/78  ? ? ?Physical Exam ?Vitals and nursing note reviewed.  ?Constitutional:   ?   General: She is not

## 2021-05-03 ENCOUNTER — Encounter: Payer: Self-pay | Admitting: Internal Medicine

## 2021-05-03 ENCOUNTER — Telehealth: Payer: Self-pay

## 2021-05-03 NOTE — Telephone Encounter (Signed)
PA completed waiting on insurance approval. ? ?Key: [id number] ? ?KP ?

## 2021-05-04 ENCOUNTER — Other Ambulatory Visit: Payer: Self-pay | Admitting: Internal Medicine

## 2021-05-04 DIAGNOSIS — E118 Type 2 diabetes mellitus with unspecified complications: Secondary | ICD-10-CM

## 2021-05-04 MED ORDER — EMPAGLIFLOZIN 25 MG PO TABS: 25.0000 mg | ORAL_TABLET | Freq: Every day | ORAL | 0 refills | Status: DC

## 2021-05-04 NOTE — Telephone Encounter (Signed)
Denied  KP 

## 2021-05-07 ENCOUNTER — Encounter: Payer: Self-pay | Admitting: Hematology and Oncology

## 2021-05-09 ENCOUNTER — Other Ambulatory Visit: Payer: Self-pay | Admitting: Internal Medicine

## 2021-05-10 ENCOUNTER — Other Ambulatory Visit: Payer: Self-pay | Admitting: Internal Medicine

## 2021-05-11 NOTE — Telephone Encounter (Signed)
Requested medication (s) are due for refill today: yes ? ?Requested medication (s) are on the active medication list: yes ? ?Last refill:  09/14/20 #60 1 RF ? ?Future visit scheduled: yes ? ?Notes to clinic:  med not delegated to NT to RF ? ? ?Requested Prescriptions  ?Pending Prescriptions Disp Refills  ? meclizine (ANTIVERT) 12.5 MG tablet [Pharmacy Med Name: MECLIZINE 12.'5MG'$  (RX) TABLETS] 60 tablet 1  ?  Sig: TAKE 1 TABLET(12.5 MG) BY MOUTH TWICE DAILY AS NEEDED FOR DIZZINESS  ?  ? Not Delegated - Gastroenterology: Antiemetics Failed - 05/10/2021  1:50 PM  ?  ?  Failed - This refill cannot be delegated  ?  ?  Passed - Valid encounter within last 6 months  ?  Recent Outpatient Visits   ? ?      ? 3 weeks ago COVID-19 virus infection  ? [facility] Glean Hess, MD  ? 1 month ago Type II diabetes mellitus with complication [facility])  ? [facility] Glean Hess, MD  ? 5 months ago Annual physical exam  ? [facility] Glean Hess, MD  ? 6 months ago Hemorrhagic cystitis  ? [facility] Montel Culver, MD  ? 10 months ago Community acquired pneumonia of right upper lobe of lung  ? [facility] Glean Hess, MD  ? ?  ?  ?Future Appointments   ? ?        ? In 2 months [facility] Mel[facility] Jesse Sans, MD [facility] [place]  ? ?  ? ? ?  ?  ?  ? ? ? ? ?

## 2021-05-14 ENCOUNTER — Ambulatory Visit (INDEPENDENT_AMBULATORY_CARE_PROVIDER_SITE_OTHER): Payer: Medicare Other | Admitting: Vascular Surgery

## 2021-05-14 ENCOUNTER — Ambulatory Visit (INDEPENDENT_AMBULATORY_CARE_PROVIDER_SITE_OTHER): Payer: Medicare Other

## 2021-05-14 ENCOUNTER — Encounter (INDEPENDENT_AMBULATORY_CARE_PROVIDER_SITE_OTHER): Payer: Self-pay | Admitting: Vascular Surgery

## 2021-05-14 ENCOUNTER — Encounter: Payer: Self-pay | Admitting: Hematology and Oncology

## 2021-05-14 VITALS — BP 145/76 | HR 67 | Resp 16 | Wt 183.0 lb

## 2021-05-14 DIAGNOSIS — I890 Lymphedema, not elsewhere classified: Secondary | ICD-10-CM | POA: Diagnosis not present

## 2021-05-14 DIAGNOSIS — I70213 Atherosclerosis of native arteries of extremities with intermittent claudication, bilateral legs: Secondary | ICD-10-CM

## 2021-05-14 DIAGNOSIS — I10 Essential (primary) hypertension: Secondary | ICD-10-CM | POA: Diagnosis not present

## 2021-05-14 DIAGNOSIS — E118 Type 2 diabetes mellitus with unspecified complications: Secondary | ICD-10-CM

## 2021-05-14 DIAGNOSIS — I25119 Atherosclerotic heart disease of native coronary artery with unspecified angina pectoris: Secondary | ICD-10-CM | POA: Diagnosis not present

## 2021-05-14 DIAGNOSIS — I2510 Atherosclerotic heart disease of native coronary artery without angina pectoris: Secondary | ICD-10-CM | POA: Insufficient documentation

## 2021-05-14 NOTE — Progress Notes (Addendum)
? ? ? ? ?MRN : [id number] ? ?Misty Villarreal is a 71 y.o. (02/24/1950) female who presents with chief complaint of check circulation. ? ?History of Present Illness:  ? ?The patient returns to the office for followup and review status post angiogram with intervention on 07/21/2018.  ?  ?Procedure(s) Performed: ?            1.  Introduction catheter into left lower extremity 3rd order catheter placement  ?            2.  Contrast injection left lower extremity for distal runoff ?            3.  Crosser atherectomy of the left SFA and popliteal arteries ?            4.   Percutaneous transluminal angioplasty left superficial femoral artery and popliteal ?            5.  Star close closure right common femoral arteriotomy ?  ?The patient notes her lower extremity symptoms are stable.  She recently went to a concert and was able to walk to the venue without problem, she notes she did have some problems walking back but it was still OK.  No interval shortening of the patient's claudication distance or rest pain symptoms. No new ulcers or wounds have occurred since the last visit.   ?  ?There have been no significant changes to the patient's overall health care. ?  ?The patient denies amaurosis fugax or recent TIA symptoms. There are no recent neurological changes noted. ?The patient denies history of DVT, PE or superficial thrombophlebitis. ?The patient denies recent episodes of angina or shortness of breath.  ?  ?ABI's Rt=0.99 and Lt=0.0.71 (previous ABI's Rt=1.01 and Lt=0.67). ?  ?Previous duplex ultrasound of the arterial system bilaterally shows the SFA intervention site on the right is patent and the left is occluded. No change compared to the last study. ?  ? ?No outpatient medications have been marked as taking for the 05/14/21 encounter (Appointment) with Delana Meyer, Dolores Lory, MD.  ? ? ?Past Medical History:  ?Diagnosis Date  ? Abnormal glandular Papanicolaou smear of cervix 09/01/2014  ? Normal Pap 2012   ? Anemia    ? Chronic kidney disease   ? stage 3  ? Depression   ? Diabetes mellitus without complication ([place])   ? DVT (deep venous thrombosis) ([place])   ? Hyperlipidemia   ? Hypertension   ? Peripheral vascular disease ([place])   ? Peripheral vascular disease ([place])   ? Slurred speech 12/03/2016  ? ? ?Past Surgical History:  ?Procedure Laterality Date  ? ANGIOPLASTY / STENTING FEMORAL Left 2014, 2013  ? BRAIN MENINGIOMA EXCISION  1988  ? BREAST BIOPSY Right 1989  ? benign  ? CARPAL TUNNEL RELEASE Left 1980  ? CATARACT EXTRACTION W/PHACO Right 09/02/2019  ? Procedure: CATARACT EXTRACTION PHACO AND INTRAOCULAR LENS PLACEMENT (IOC);  Surgeon: Eulogio Bear, MD;  Location: [facility];  Service: Ophthalmology;  Laterality: Right;  [place] 00:27.3 ?CDE 1.92 ?AP% Fluid Pack lot # [id number]  ? CATARACT EXTRACTION W/PHACO Left 10/18/2019  ? Procedure: CATARACT EXTRACTION PHACO AND INTRAOCULAR LENS PLACEMENT (IOC) LEFT DIABETIC 2.55  00:28.5;  Surgeon: Eulogio Bear, MD;  Location: [place];  Service: Ophthalmology;  Laterality: Left;  Diabetic - oral meds  ? CESAREAN SECTION    ? CHOLECYSTECTOMY  1987  ? COLONOSCOPY WITH PROPOFOL N/A 05/08/2017  ? Procedure: COLONOSCOPY WITH PROPOFOL;  Surgeon: Vicente Males,  Bailey Mech, MD;  Location: [place];  Service: Gastroenterology;  Laterality: N/A;  ? KNEE ARTHROPLASTY Right 2009  ? LOWER EXTREMITY ANGIOGRAPHY Left 07/21/2018  ? Procedure: LOWER EXTREMITY ANGIOGRAPHY;  Surgeon: Katha Cabal, MD;  Location: [facility];  Service: Cardiovascular;  Laterality: Left;  ? ? ?Social History ?Social History  ? ?Tobacco Use  ? Smoking status: Former  ?  Packs/day: 0.50  ?  Years: 46.00  ?  Pack years: 23.00  ?  Types: Cigarettes  ?  Quit date: 04/05/2013  ?  Years since quitting: 8.1  ? Smokeless tobacco: Never  ?Vaping Use  ? Vaping Use: Never used  ?Substance Use Topics  ? Alcohol use: Yes  ?  Alcohol/week: 2.0 standard drinks  ?  Types: 2 Standard drinks or equivalent per week  ? Drug use:  Never  ? ? ?Family History ?Family History  ?Problem Relation Age of Onset  ? Diabetes Mother   ? Heart failure Father   ? Diabetes Father   ? Breast cancer Neg Hx   ? ? ?Allergies  ?Allergen Reactions  ? Augmentin [Amoxicillin-Pot Clavulanate] Diarrhea  ? Levaquin [Levofloxacin] Nausea And Vomiting and Other (See Comments)  ?  Ankle pain  ? Tetracycline   ?  Other reaction(s): emesis  ? Cefaclor Rash  ? Cephalexin Rash  ? Rosuvastatin Diarrhea  ? Sulfa Antibiotics Rash  ?  Other reaction(s): emesis  ? ? ? ?REVIEW OF SYSTEMS (Negative unless checked) ? ?Constitutional: '[]'$ Weight loss  '[]'$ Fever  '[]'$ Chills ?Cardiac: '[]'$ Chest pain   '[]'$ Chest pressure   '[]'$ Palpitations   '[]'$ Shortness of breath when laying flat   '[]'$ Shortness of breath with exertion. ?Vascular:  '[x]'$ Pain in legs with walking   '[]'$ Pain in legs at rest  '[]'$ History of DVT   '[]'$ Phlebitis   '[]'$ Swelling in legs   '[]'$ Varicose veins   '[]'$ Non-healing ulcers ?Pulmonary:   '[]'$ Uses home oxygen   '[]'$ Productive cough   '[]'$ Hemoptysis   '[]'$ Wheeze  '[]'$ COPD   '[]'$ Asthma ?Neurologic:  '[]'$ Dizziness   '[]'$ Seizures   '[]'$ History of stroke   '[]'$ History of TIA  '[]'$ Aphasia   '[]'$ Vissual changes   '[]'$ Weakness or numbness in arm   '[]'$ Weakness or numbness in leg ?Musculoskeletal:   '[]'$ Joint swelling   '[]'$ Joint pain   '[]'$ Low back pain ?Hematologic:  '[]'$ Easy bruising  '[]'$ Easy bleeding   '[]'$ Hypercoagulable state   '[]'$ Anemic ?Gastrointestinal:  '[]'$ Diarrhea   '[]'$ Vomiting  '[]'$ Gastroesophageal reflux/heartburn   '[]'$ Difficulty swallowing. ?Genitourinary:  '[]'$ Chronic kidney disease   '[]'$ Difficult urination  '[]'$ Frequent urination   '[]'$ Blood in urine ?Skin:  '[]'$ Rashes   '[]'$ Ulcers  ?Psychological:  '[]'$ History of anxiety   '[]'$  History of major depression. ? ?Physical Examination ? ?There were no vitals filed for this visit. ?There is no height or weight on file to calculate BMI. ?Gen: WD/WN, NAD ?Head: Center/AT, No temporalis wasting.  ?Ear/Nose/Throat: Hearing grossly intact, nares w/o erythema or drainage ?Eyes: PER, EOMI, sclera nonicteric.   ?Neck: Supple, no masses.  No bruit or JVD.  ?Pulmonary:  Good air movement, no audible wheezing, no use of accessory muscles.  ?Cardiac: RRR, normal S1, S2, no Murmurs. ?Vascular:  mild trophic changes, no open wounds ?Vessel Right Left  ?Radial Palpable Palpable  ?PT Not Palpable Not Palpable  ?DP Not Palpable Not Palpable  ?Gastrointestinal: soft, non-distended. No guarding/no peritoneal signs.  ?Musculoskeletal: M/S 5/5 throughout.  No visible deformity.  ?Neurologic: CN 2-12 intact. Pain and light touch intact in extremities.  Symmetrical.  Speech is fluent. Motor exam as listed  above. ?Psychiatric: Judgment intact, Mood & affect appropriate for pt's clinical situation. ?Dermatologic: No rashes or ulcers noted.  No changes consistent with cellulitis. ? ? ?CBC ?Lab Results  ?Component Value Date  ? WBC 8.7 12/01/2020  ? HGB 13.0 12/01/2020  ? HCT 38.7 12/01/2020  ? MCV 88 12/01/2020  ? PLT 327 12/01/2020  ? ? ?BMET ?   ?Component Value Date/Time  ? NA 139 04/02/2021 1132  ? NA 139 04/27/2013 0713  ? K 5.0 04/02/2021 1132  ? K 4.2 04/27/2013 0713  ? CL 101 04/02/2021 1132  ? CL 108 (H) 04/27/2013 8502  ? CO2 20 04/02/2021 1132  ? CO2 25 04/27/2013 0713  ? GLUCOSE 154 (H) 04/02/2021 1132  ? GLUCOSE 155 (H) 06/28/2019 1041  ? GLUCOSE 137 (H) 04/27/2013 0713  ? BUN 35 (H) 04/02/2021 1132  ? BUN 21 (H) 04/27/2013 7741  ? CREATININE 1.40 (H) 04/02/2021 1132  ? CREATININE 1.28 04/27/2013 0713  ? CALCIUM 10.3 04/02/2021 1132  ? CALCIUM 9.3 04/27/2013 0713  ? GFRNONAA 37 (L) 06/28/2019 1041  ? GFRNONAA 44 (L) 04/27/2013 2878  ? GFRAA 43 (L) 06/28/2019 1041  ? GFRAA 52 (L) 04/27/2013 0713  ? ?CrCl cannot be calculated (Patient's most recent lab result is older than the maximum 21 days allowed.). ? ?COAG ?Lab Results  ?Component Value Date  ? INR 0.85 12/03/2016  ? ? ?Radiology ?No results found. ? ? ?Assessment/Plan ?1. Atherosclerosis of native artery of both lower extremities with intermittent claudication ([place]) ?  Recommend: ? ?The patient has evidence of atherosclerosis of the lower extremities with claudication.  The patient does not voice lifestyle limiting changes at this point in time. ? ?Noninvasive studies do n

## 2021-05-22 ENCOUNTER — Other Ambulatory Visit: Payer: Self-pay

## 2021-05-22 DIAGNOSIS — E118 Type 2 diabetes mellitus with unspecified complications: Secondary | ICD-10-CM

## 2021-05-22 MED ORDER — DAPAGLIFLOZIN PROPANEDIOL 10 MG PO TABS: 10.0000 mg | ORAL_TABLET | Freq: Every day | ORAL | 3 refills | Status: DC

## 2021-05-22 MED ORDER — DAPAGLIFLOZIN PROPANEDIOL 10 MG PO TABS: 10.0000 mg | ORAL_TABLET | Freq: Every day | ORAL | 1 refills | Status: DC

## 2021-05-25 ENCOUNTER — Emergency Department: Payer: Medicare Other

## 2021-05-25 ENCOUNTER — Other Ambulatory Visit: Payer: Self-pay

## 2021-05-25 ENCOUNTER — Emergency Department
Admission: EM | Admit: 2021-05-25 | Discharge: 2021-05-25 | Disposition: A | Discharge status: Home / Self Care | Payer: Medicare Other | Attending: Emergency Medicine | Admitting: Emergency Medicine

## 2021-05-25 ENCOUNTER — Ambulatory Visit: Payer: Self-pay | Admitting: *Deleted

## 2021-05-25 DIAGNOSIS — K5792 Diverticulitis of intestine, part unspecified, without perforation or abscess without bleeding: Secondary | ICD-10-CM

## 2021-05-25 DIAGNOSIS — N189 Chronic kidney disease, unspecified: Secondary | ICD-10-CM | POA: Insufficient documentation

## 2021-05-25 DIAGNOSIS — K5732 Diverticulitis of large intestine without perforation or abscess without bleeding: Secondary | ICD-10-CM | POA: Insufficient documentation

## 2021-05-25 DIAGNOSIS — I2510 Atherosclerotic heart disease of native coronary artery without angina pectoris: Secondary | ICD-10-CM | POA: Diagnosis not present

## 2021-05-25 DIAGNOSIS — I129 Hypertensive chronic kidney disease with stage 1 through stage 4 chronic kidney disease, or unspecified chronic kidney disease: Secondary | ICD-10-CM | POA: Diagnosis not present

## 2021-05-25 DIAGNOSIS — R1031 Right lower quadrant pain: Secondary | ICD-10-CM | POA: Diagnosis present

## 2021-05-25 DIAGNOSIS — E1122 Type 2 diabetes mellitus with diabetic chronic kidney disease: Secondary | ICD-10-CM | POA: Diagnosis not present

## 2021-05-25 DIAGNOSIS — D649 Anemia, unspecified: Secondary | ICD-10-CM | POA: Diagnosis not present

## 2021-05-25 LAB — URINALYSIS, ROUTINE W REFLEX MICROSCOPIC
Bilirubin Urine: NEGATIVE
Glucose, UA: >=500 mg/dL — AB
Hgb urine dipstick: NEGATIVE
Ketones, ur: NEGATIVE mg/dL
Leukocytes,Ua: NEGATIVE
Nitrite: NEGATIVE
Protein, ur: NEGATIVE mg/dL
Specific Gravity, Urine: 1.023 (ref 1.005–1.030)
pH: 5 (ref 5.0–8.0)

## 2021-05-25 LAB — COMPREHENSIVE METABOLIC PANEL
ALT: 35 U/L (ref 0–44)
AST: 31 U/L (ref 15–41)
Albumin: 3.6 g/dL (ref 3.5–5.0)
Alkaline Phosphatase: 129 U/L — ABNORMAL HIGH (ref 38–126)
Anion gap: 11 (ref 5–15)
BUN: 41 mg/dL — ABNORMAL HIGH (ref 8–23)
CO2: 21 mmol/L — ABNORMAL LOW (ref 22–32)
Calcium: 9.2 mg/dL (ref 8.9–10.3)
Chloride: 105 mmol/L (ref 98–111)
Creatinine, Ser: 1.7 mg/dL — ABNORMAL HIGH (ref 0.44–1.00)
GFR, Estimated: 32 mL/min — ABNORMAL LOW (ref >60)
Glucose, Bld: 181 mg/dL — ABNORMAL HIGH (ref 70–99)
Potassium: 4.6 mmol/L (ref 3.5–5.1)
Sodium: 137 mmol/L (ref 135–145)
Total Bilirubin: 0.9 mg/dL (ref 0.3–1.2)
Total Protein: 7.3 g/dL (ref 6.5–8.1)

## 2021-05-25 LAB — CBC
HCT: 35.5 % — ABNORMAL LOW (ref 36.0–46.0)
Hemoglobin: 11.8 g/dL — ABNORMAL LOW (ref 12.0–15.0)
MCH: 30.1 pg (ref 26.0–34.0)
MCHC: 33.2 g/dL (ref 30.0–36.0)
MCV: 90.6 fL (ref 80.0–100.0)
Platelets: 315 10*3/uL (ref 150–400)
RBC: 3.92 MIL/uL (ref 3.87–5.11)
RDW: 12.2 % (ref 11.5–15.5)
WBC: 6.6 10*3/uL (ref 4.0–10.5)
nRBC: 0 % (ref 0.0–0.2)

## 2021-05-25 LAB — LIPASE, BLOOD: Lipase: 33 U/L (ref 11–51)

## 2021-05-25 MED ORDER — LACTATED RINGERS IV BOLUS
1000.0000 mL | Freq: Once | INTRAVENOUS | Status: AC
  Administered 2021-05-25: 1000 mL via INTRAVENOUS

## 2021-05-25 MED ORDER — ONDANSETRON 4 MG PO TBDP: 4.0000 mg | ORAL_TABLET | Freq: Three times a day (TID) | ORAL | 0 refills | Status: DC | PRN

## 2021-05-25 MED ORDER — IOHEXOL 300 MG/ML  SOLN
80.0000 mL | Freq: Once | INTRAMUSCULAR | Status: AC | PRN
Start: 2021-05-25 — End: 2021-05-25
  Administered 2021-05-25: 80 mL via INTRAVENOUS
  Filled 2021-05-25: qty 80

## 2021-05-25 MED ORDER — AMOXICILLIN-POT CLAVULANATE 875-125 MG PO TABS: 1.0000 | ORAL_TABLET | Freq: Two times a day (BID) | ORAL | 0 refills | Status: AC

## 2021-05-25 NOTE — ED Provider Notes (Signed)
? ?[facility] ?Provider Note ? ? ? Event Date/Time  ? First MD Initiated Contact with Patient 05/25/21 1355   ?  (approximate) ? ? ?History  ? ?Chief Complaint ?Abdominal Pain ? ? ?HPI ? ?Misty Villarreal is a 71 y.o. female with past medical history of hypertension, hyperlipidemia, diabetes, CAD, CKD, peripheral vascular disease, and DVT who presents to the ED complaining of abdominal pain.  Patient reports that she has been having increasing cramping pain in the bilateral lower quadrants of her abdomen for the past 3 days.  Pain is described as constant and not exacerbated or alleviated by anything in particular.  She has been feeling constipated and has tried 2 enemas at home without significant relief, denies any nausea or vomiting.  She has not had any fevers, dysuria, hematuria, or flank pain.  She spoke with her PCPs office earlier today and was referred to the ED for further evaluation. ?  ? ? ?Physical Exam  ? ?Triage Vital Signs: ?ED Triage Vitals  ?Enc Vitals Group  ?   BP 05/25/21 1244 114/81  ?   Pulse Rate 05/25/21 1244 66  ?   Resp 05/25/21 1244 20  ?   Temp 05/25/21 1244 98.1 ?F (36.7 ?C)  ?   Temp Source 05/25/21 1244 Oral  ?   SpO2 05/25/21 1244 94 %  ?   Weight 05/25/21 1244 175 lb (79.4 kg)  ?   Height 05/25/21 1244 '5\' 2"'$  (1.575 m)  ?   Head Circumference --   ?   Peak Flow --   ?   Pain Score 05/25/21 1249 8  ?   Pain Loc --   ?   Pain Edu? --   ?   Excl. in [place]? --   ? ? ?Most recent vital signs: ?Vitals:  ? 05/25/21 1244  ?BP: 114/81  ?Pulse: 66  ?Resp: 20  ?Temp: 98.1 ?F (36.7 ?C)  ?SpO2: 94%  ? ? ?Constitutional: Alert and oriented. ?Eyes: Conjunctivae are normal. ?Head: Atraumatic. ?Nose: No congestion/rhinnorhea. ?Mouth/Throat: Mucous membranes are moist. ?Cardiovascular: Normal rate, regular rhythm. Grossly normal heart sounds.  2+ radial pulses bilaterally. ?Respiratory: Normal respiratory effort.  No retractions. Lungs CTAB. ?Gastrointestinal: Soft and diffusely  tender to palpation with no rebound or guarding. No distention. ?Musculoskeletal: No lower extremity tenderness nor edema.  ?Neurologic:  Normal speech and language. No gross focal neurologic deficits are appreciated. ? ? ? ?ED Results / Procedures / Treatments  ? ?Labs ?(all labs ordered are listed, but only abnormal results are displayed) ?Labs Reviewed  ?COMPREHENSIVE METABOLIC PANEL - Abnormal; Notable for the following components:  ?    Result Value  ? CO2 21 (*)   ? Glucose, Bld 181 (*)   ? BUN 41 (*)   ? Creatinine, Ser 1.70 (*)   ? Alkaline Phosphatase 129 (*)   ? GFR, Estimated 32 (*)   ? All other components within normal limits  ?CBC - Abnormal; Notable for the following components:  ? Hemoglobin 11.8 (*)   ? HCT 35.5 (*)   ? All other components within normal limits  ?URINALYSIS, ROUTINE W REFLEX MICROSCOPIC - Abnormal; Notable for the following components:  ? Color, Urine YELLOW (*)   ? APPearance HAZY (*)   ? Glucose, UA >=500 (*)   ? Bacteria, UA RARE (*)   ? All other components within normal limits  ?LIPASE, BLOOD  ? ?RADIOLOGY ?CT of abdomen/pelvis reviewed by me with inflammatory changes noted  in the left lower quadrant but no focal fluid collections or dilated bowel loops. ? ?PROCEDURES: ? ?Critical Care performed: No ? ?Procedures ? ? ?MEDICATIONS ORDERED IN ED: ?Medications  ?lactated ringers bolus 1,000 mL (1,000 mLs Intravenous New Bag/Given 05/25/21 1510)  ?iohexol (OMNIPAQUE) 300 MG/ML solution 80 mL (80 mLs Intravenous Contrast Given 05/25/21 1520)  ? ? ? ?IMPRESSION / MDM / ASSESSMENT AND PLAN / ED COURSE  ?I reviewed the triage vital signs and the nursing notes. ?             ?               ? ?71 y.o. female with past medical history of hypertension, hyperlipidemia, diabetes, CAD, CKD, peripheral vascular disease, and DVT who presents to the ED with 3 days of increasing bilateral lower quadrant crampy abdominal pain associated with constipation. ? ?Differential diagnosis includes, but is  not limited to, bowel obstruction, diverticulitis, UTI, kidney stone, gastritis, appendicitis, constipation. ? ?Patient nontoxic-appearing and in no acute distress, vital signs are unremarkable.  She is diffusely tender to palpation on exam and we will further assess with CT scan to rule out bowel obstruction, diverticulitis, or appendicitis.  Labs thus far are reassuring with CBC showing mild anemia with no leukocytosis, BMP without electrolyte abnormality, does show mild AKI on top of patient's chronic kidney disease.  LFTs and lipase are unremarkable.  We will hydrate with IV fluids for mild AKI, UA does not show signs of infection or hematuria to suggest kidney stone. ? ?CT scan is consistent with an uncomplicated diverticulitis, no evidence of bowel obstruction.  Patient reports feeling better following IV fluids and is appropriate for outpatient management with antibiotics and bowel rest.  She was counseled on dietary changes as well as over-the-counter management of constipation, will be provided with prescription for Augmentin for her diverticulitis.  She does report diarrhea in the past with diverticulitis but given her other antibiotic allergies, this appears to be the best option.  She will be prescribed Zofran for use as needed and was counseled to follow-up with her PCP, otherwise to return to the ED for new or worsening symptoms.  Patient agrees with plan. ? ?  ? ? ?FINAL CLINICAL IMPRESSION(S) / ED DIAGNOSES  ? ?Final diagnoses:  ?Diverticulitis  ? ? ? ?Rx / DC Orders  ? ?ED Discharge Orders   ? ?      Ordered  ?  amoxicillin-clavulanate (AUGMENTIN) 875-125 MG tablet  2 times daily       ? 05/25/21 1553  ?  ondansetron (ZOFRAN-ODT) 4 MG disintegrating tablet  Every 8 hours PRN       ? 05/25/21 1553  ? ?  ?  ? ?  ? ? ? ?Note:  This document was prepared using Dragon voice recognition software and may include unintentional dictation errors. ?  ?Blake Divine, MD ?05/25/21 1555 ? ?

## 2021-05-25 NOTE — ED Triage Notes (Signed)
Patient to ER via POV with complaints of lower abdominal cramping and constipation. Reports symptoms started on Tuesday night. Last bowel movement was Wednesday after using an enema, also used an enema last night with no relief. Reports that she usually has diarrhea and has to take anti-diarrhea medication (last dose last weekend). Reports having a lot of gas.  ?

## 2021-05-25 NOTE — Discharge Instructions (Addendum)

## 2021-05-25 NOTE — Telephone Encounter (Signed)
Reason for Disposition ? [1] SEVERE pain AND [2] age > 70 years ? ?Answer Assessment - Initial Assessment Questions ?1. LOCATION: "Where does it hurt?"  ?    Abd pain on and off for 3 days.   I thought it was constipation.   I did pass some stool.   I did 3 enemas.    I'm having a lot of gas.  My bowels are not moving.   It doesn't feel like constipation.   ?I've been told I have diverticulitis. ?I was having a lot of diarrhea from metformin.   My medications were changed which helped.    ?The other night I had diarrhea and I started cramping and I've been cramping on and off since.    ?2. RADIATION: "Does the pain shoot anywhere else?" (e.g., chest, back) ?    No.   It's my lower abd across the whole bottom up to my belly button. ?3. ONSET: "When did the pain begin?" (e.g., minutes, hours or days ago)  ?    3 days ago ?4. SUDDEN: "Gradual or sudden onset?" ?    *No Answer* ?5. PATTERN "Does the pain come and go, or is it constant?" ?   - If constant: "Is it getting better, staying the same, or worsening?"  ?    (Note: Constant means the pain never goes away completely; most serious pain is constant and it progresses)  ?   - If intermittent: "How long does it last?" "Do you have pain now?" ?    (Note: Intermittent means the pain goes away completely between bouts) ?    Intermittent cramping for last 3 days. ?6. SEVERITY: "How bad is the pain?"  (e.g., Scale 1-10; mild, moderate, or severe) ?  - MILD (1-3): doesn't interfere with normal activities, abdomen soft and not tender to touch  ?  - MODERATE (4-7): interferes with normal activities or awakens from sleep, abdomen tender to touch  ?  - SEVERE (8-10): excruciating pain, doubled over, unable to do any normal activities  ?    *No Answer* ?7. RECURRENT SYMPTOM: "Have you ever had this type of stomach pain before?" If Yes, ask: "When was the last time?" and "What happened that time?"  ?    No ?8. CAUSE: "What do you think is causing the stomach pain?" ?    I don't  know.    My stools are flat and hard looking.    ?9. RELIEVING/AGGRAVATING FACTORS: "What makes it better or worse?" (e.g., movement, antacids, bowel movement) ?    Nothing ?10. OTHER SYMPTOMS: "Do you have any other symptoms?" (e.g., back pain, diarrhea, fever, urination pain, vomiting) ?       ?11. PREGNANCY: "Is there any chance you are pregnant?" "When was your last menstrual period?" ? ?Protocols used: Abdominal Pain - Female-A-AH ? ?

## 2021-05-25 NOTE — Telephone Encounter (Signed)
Noted   Pt going to ED.  KP 

## 2021-05-25 NOTE — Telephone Encounter (Signed)
?  Chief Complaint: C/o severe lower abd pain for the last 3 days. ?Symptoms: intermittent cramping, difficulty passing stool.   It's flat and hard when she does pass some.   Did 2 enemas with little results. ?Frequency: Intermittently for the last 3 days. ?Pertinent Negatives: Patient denies vomiting.    "This feels different than constipation". ?Disposition: '[x]'$ ED /'[]'$ Urgent Care (no appt availability in office) / '[]'$ Appointment(In office/virtual)/ '[]'$  [place] [facility]/ '[]'$ Home Care/ '[]'$ Refused Recommended Disposition /'[]'$ [place] [facility]/ '[]'$  Follow-up with PCP ?Additional Notes: Going to [facility].   Information sent to [facility] for Dr. Army Melia.  ?

## 2021-05-25 NOTE — ED Notes (Signed)
PT provided with dc ppw. Pt  questions answered.  Pt follow up and rx information reviewed. Pt  declines vs at dc. Pt  provides verbal consent for dc and is ambulatory to lobby on foot alert and oriented x4. ?

## 2021-05-29 ENCOUNTER — Inpatient Hospital Stay: Payer: Medicare Other | Attending: Oncology

## 2021-05-29 DIAGNOSIS — E1122 Type 2 diabetes mellitus with diabetic chronic kidney disease: Secondary | ICD-10-CM | POA: Diagnosis not present

## 2021-05-29 DIAGNOSIS — I129 Hypertensive chronic kidney disease with stage 1 through stage 4 chronic kidney disease, or unspecified chronic kidney disease: Secondary | ICD-10-CM | POA: Insufficient documentation

## 2021-05-29 DIAGNOSIS — J432 Centrilobular emphysema: Secondary | ICD-10-CM | POA: Insufficient documentation

## 2021-05-29 DIAGNOSIS — D631 Anemia in chronic kidney disease: Secondary | ICD-10-CM | POA: Insufficient documentation

## 2021-05-29 DIAGNOSIS — N1832 Chronic kidney disease, stage 3b: Secondary | ICD-10-CM | POA: Diagnosis not present

## 2021-05-29 DIAGNOSIS — D801 Nonfamilial hypogammaglobulinemia: Secondary | ICD-10-CM | POA: Insufficient documentation

## 2021-05-29 DIAGNOSIS — K589 Irritable bowel syndrome without diarrhea: Secondary | ICD-10-CM | POA: Insufficient documentation

## 2021-05-29 DIAGNOSIS — Z87891 Personal history of nicotine dependence: Secondary | ICD-10-CM | POA: Diagnosis not present

## 2021-05-29 DIAGNOSIS — E538 Deficiency of other specified B group vitamins: Secondary | ICD-10-CM | POA: Insufficient documentation

## 2021-05-29 LAB — CBC WITH DIFFERENTIAL/PLATELET
Abs Immature Granulocytes: 0.04 10*3/uL (ref 0.00–0.07)
Basophils Absolute: 0.1 10*3/uL (ref 0.0–0.1)
Basophils Relative: 1 %
Eosinophils Absolute: 0.3 10*3/uL (ref 0.0–0.5)
Eosinophils Relative: 4 %
HCT: 36.1 % (ref 36.0–46.0)
Hemoglobin: 11.8 g/dL — ABNORMAL LOW (ref 12.0–15.0)
Immature Granulocytes: 1 %
Lymphocytes Relative: 21 %
Lymphs Abs: 1.5 10*3/uL (ref 0.7–4.0)
MCH: 29.4 pg (ref 26.0–34.0)
MCHC: 32.7 g/dL (ref 30.0–36.0)
MCV: 90 fL (ref 80.0–100.0)
Monocytes Absolute: 0.6 10*3/uL (ref 0.1–1.0)
Monocytes Relative: 8 %
Neutro Abs: 4.4 10*3/uL (ref 1.7–7.7)
Neutrophils Relative %: 65 %
Platelets: 348 10*3/uL (ref 150–400)
RBC: 4.01 MIL/uL (ref 3.87–5.11)
RDW: 12 % (ref 11.5–15.5)
WBC: 6.9 10*3/uL (ref 4.0–10.5)
nRBC: 0 % (ref 0.0–0.2)

## 2021-05-29 LAB — COMPREHENSIVE METABOLIC PANEL
ALT: 19 U/L (ref 0–44)
AST: 22 U/L (ref 15–41)
Albumin: 3.8 g/dL (ref 3.5–5.0)
Alkaline Phosphatase: 99 U/L (ref 38–126)
Anion gap: 8 (ref 5–15)
BUN: 33 mg/dL — ABNORMAL HIGH (ref 8–23)
CO2: 23 mmol/L (ref 22–32)
Calcium: 9.2 mg/dL (ref 8.9–10.3)
Chloride: 102 mmol/L (ref 98–111)
Creatinine, Ser: 1.9 mg/dL — ABNORMAL HIGH (ref 0.44–1.00)
GFR, Estimated: 28 mL/min — ABNORMAL LOW (ref >60)
Glucose, Bld: 168 mg/dL — ABNORMAL HIGH (ref 70–99)
Potassium: 4.8 mmol/L (ref 3.5–5.1)
Sodium: 133 mmol/L — ABNORMAL LOW (ref 135–145)
Total Bilirubin: 0.8 mg/dL (ref 0.3–1.2)
Total Protein: 7.4 g/dL (ref 6.5–8.1)

## 2021-05-29 LAB — RETIC PANEL
Immature Retic Fract: 12.9 % (ref 2.3–15.9)
RBC.: 4.03 MIL/uL (ref 3.87–5.11)
Retic Count, Absolute: 69.7 10*3/uL (ref 19.0–186.0)
Retic Ct Pct: 1.7 % (ref 0.4–3.1)
Reticulocyte Hemoglobin: 33.9 pg (ref >27.9)

## 2021-05-29 LAB — IRON AND TIBC
Iron: 109 ug/dL (ref 28–170)
Saturation Ratios: 36 % — ABNORMAL HIGH (ref 10.4–31.8)
TIBC: 304 ug/dL (ref 250–450)
UIBC: 195 ug/dL

## 2021-05-29 LAB — FERRITIN: Ferritin: 158 ng/mL (ref 11–307)

## 2021-05-29 LAB — VITAMIN B12: Vitamin B-12: 526 pg/mL (ref 180–914)

## 2021-05-31 ENCOUNTER — Inpatient Hospital Stay: Payer: Medicare Other

## 2021-05-31 ENCOUNTER — Encounter: Payer: Self-pay | Admitting: Oncology

## 2021-05-31 ENCOUNTER — Inpatient Hospital Stay: Payer: Medicare Other | Admitting: Oncology

## 2021-05-31 VITALS — BP 151/88 | HR 57 | Temp 96.9°F | Wt 183.0 lb

## 2021-05-31 DIAGNOSIS — N1832 Chronic kidney disease, stage 3b: Secondary | ICD-10-CM | POA: Diagnosis not present

## 2021-05-31 DIAGNOSIS — D509 Iron deficiency anemia, unspecified: Secondary | ICD-10-CM

## 2021-05-31 DIAGNOSIS — D631 Anemia in chronic kidney disease: Secondary | ICD-10-CM | POA: Diagnosis not present

## 2021-05-31 DIAGNOSIS — D801 Nonfamilial hypogammaglobulinemia: Secondary | ICD-10-CM | POA: Diagnosis not present

## 2021-05-31 NOTE — Progress Notes (Signed)
?Hematology/Oncology Progress note ?[phone] [phone] ?  ? ?  ? ? ? ?Clinic Day:  05/31/2021 ? ?Referring physician: Glean Hess, MD ? ?Chief Complaint: Misty Villarreal is a 71 y.o. female presents to follow up for anemia in CKD, hypogammaglobulinemia  ? ?PERTINENT HEMATOLOGY HISTORY  ?Patient previously followed up by Dr.Corcoran, patient switched care to me on 07/27/20 ?Extensive medical record review was performed by me ? ?#Anemia in chronic kidney disease. ?11/11/2018  protein electrophoresis showed no M spike  ?Patient received IV Venofer treatments ? ?#Vitamin B12 deficiency ?11/11/2018, vitamin B12 was 104.   ?Patient is on oral vitamin B12 supplementation. ?Anti-parietal antibody and intrinsic factor antibody were normal on 12/14/2018. ?  ?#stage IIIB chronic kidney disease felt secondary to diabetes.   ?11/18/2018 UPEP 24 h negative for M protein. Serum light chain ratio normal.  ?Patient follows up with nephrology Dr. Holley Raring. ?  ?05/08/2017 colonoscopy revealed diverticulosis and non-bleeding internal hemorrhoids. ? ?#Former smoker, ?Low dose chest CT on 11/16/2019 revealed Lung-RADS 2S, benign appearance or behavior. There was mild diffuse bronchial wall thickening with very mild centrilobular and paraseptal emphysema; imaging findings suggestive of underlying COPD. There were calcifications of the aortic valve. Echocardiographic correlation for evaluation of potential valvular dysfunction may be warranted if clinically indicated. ? ? ?INTERVAL HISTORY ?Misty Villarreal is a 71 y.o. female who has above history reviewed by me today presents for follow up visit for anemia ?Problems and complaints are listed below:  ?Previously tolerated IV Venofer treatments well.  Chronic IBS symptoms. ?She currently takes vitamin B12 1000 MCG daily. ? ?Past Medical History:  ?Diagnosis Date  ? Abnormal glandular Papanicolaou smear of cervix 09/01/2014  ? Normal Pap 2012   ? Anemia   ?  Chronic kidney disease   ? stage 3  ? Depression   ? Diabetes mellitus without complication ([place])   ? DVT (deep venous thrombosis) ([place])   ? Hyperlipidemia   ? Hypertension   ? Peripheral vascular disease ([place])   ? Peripheral vascular disease ([place])   ? Slurred speech 12/03/2016  ? ? ?Past Surgical History:  ?Procedure Laterality Date  ? ANGIOPLASTY / STENTING FEMORAL Left 2014, 2013  ? BRAIN MENINGIOMA EXCISION  1988  ? BREAST BIOPSY Right 1989  ? benign  ? CARPAL TUNNEL RELEASE Left 1980  ? CATARACT EXTRACTION W/PHACO Right 09/02/2019  ? Procedure: CATARACT EXTRACTION PHACO AND INTRAOCULAR LENS PLACEMENT (IOC);  Surgeon: Eulogio Bear, MD;  Location: [facility];  Service: Ophthalmology;  Laterality: Right;  [place] 00:27.3 ?CDE 1.92 ?AP% Fluid Pack lot # [id number]  ? CATARACT EXTRACTION W/PHACO Left 10/18/2019  ? Procedure: CATARACT EXTRACTION PHACO AND INTRAOCULAR LENS PLACEMENT (IOC) LEFT DIABETIC 2.55  00:28.5;  Surgeon: Eulogio Bear, MD;  Location: [place];  Service: Ophthalmology;  Laterality: Left;  Diabetic - oral meds  ? CESAREAN SECTION    ? CHOLECYSTECTOMY  1987  ? COLONOSCOPY WITH PROPOFOL N/A 05/08/2017  ? Procedure: COLONOSCOPY WITH PROPOFOL;  Surgeon: Jonathon Bellows, MD;  Location: [facility];  Service: Gastroenterology;  Laterality: N/A;  ? KNEE ARTHROPLASTY Right 2009  ? LOWER EXTREMITY ANGIOGRAPHY Left 07/21/2018  ? Procedure: LOWER EXTREMITY ANGIOGRAPHY;  Surgeon: Katha Cabal, MD;  Location: [place] [facility];  Service: Cardiovascular;  Laterality: Left;  ? ? ?Family History  ?Problem Relation Age of Onset  ? Diabetes Mother   ? Heart failure Father   ? Diabetes Father   ? Breast cancer Neg Hx   ? ? ?  Social History:  reports that she quit smoking about 8 years ago. Her smoking use included cigarettes. She has a 23.00 pack-year smoking history. She has never used smokeless tobacco. She reports current alcohol use of about 2.0 standard drinks per week. She reports that she does  not use drugs. She previously smoked a pack of cigarettes a day for 40 years.  She drinks red wine occasionally.    Her husband, [name], died in 06-24-18.   ? ?Allergies:  ?Allergies  ?Allergen Reactions  ? Augmentin [Amoxicillin-Pot Clavulanate] Diarrhea  ? Levaquin [Levofloxacin] Nausea And Vomiting and Other (See Comments)  ?  Ankle pain  ? Tetracycline   ?  Other reaction(s): emesis  ? Cefaclor Rash  ? Cephalexin Rash  ? Rosuvastatin Diarrhea  ? Sulfa Antibiotics Rash  ?  Other reaction(s): emesis  ? ? ?Current Medications: ?Current Outpatient Medications  ?Medication Sig Dispense Refill  ? albuterol (VENTOLIN HFA) 108 (90 Base) MCG/ACT inhaler Inhale 2 puffs into the lungs every 6 (six) hours as needed. 54 g 0  ? aspirin 81 MG chewable tablet Chew 81 mg by mouth daily.     ? atorvastatin (LIPITOR) 40 MG tablet TAKE 1 TABLET DAILY 90 tablet 3  ? blood glucose meter kit and supplies KIT Dispense based on patient and insurance preference. Use to check BS bid 1 each 0  ? Cholecalciferol (VITAMIN D) 50 MCG (2000 UT) CAPS Take 2 capsules by mouth daily.    ? clopidogrel (PLAVIX) 75 MG tablet TAKE 1 TABLET DAILY 90 tablet 3  ? Cyanocobalamin (VITAMIN B 12 PO) Take 1,000 mcg by mouth daily. 1027m daily    ? dapagliflozin propanediol (FARXIGA) 10 MG TABS tablet Take 1 tablet (10 mg total) by mouth daily before breakfast. 90 tablet 1  ? empagliflozin (JARDIANCE) 25 MG TABS tablet Take 1 tablet (25 mg total) by mouth daily before breakfast. 90 tablet 0  ? fexofenadine (ALLEGRA) 180 MG tablet Take 180 mg by mouth daily.    ? lisinopril (ZESTRIL) 20 MG tablet TAKE 1 TABLET DAILY (REPLACES LISINOPRIL HCT) 90 tablet 1  ? meclizine (ANTIVERT) 12.5 MG tablet TAKE 1 TABLET(12.5 MG) BY MOUTH TWICE DAILY AS NEEDED FOR DIZZINESS 60 tablet 0  ? metoprolol tartrate (LOPRESSOR) 50 MG tablet Take 50 mg by mouth 2 (two) times daily.    ? Multiple Vitamin tablet Take 1 tablet by mouth daily.     ? ondansetron (ZOFRAN-ODT) 4 MG  disintegrating tablet Take 1 tablet (4 mg total) by mouth every 8 (eight) hours as needed for nausea or vomiting. 12 tablet 0  ? promethazine-dextromethorphan (PROMETHAZINE-DM) 6.25-15 MG/5ML syrup Take 5 mLs by mouth 4 (four) times daily as needed for cough. 180 mL 0  ? sertraline (ZOLOFT) 50 MG tablet TAKE 1 TABLET DAILY (HAS UPCOMING APPOINTMENT PRIOR TO FURTHER REFILLS) 90 tablet 1  ? ?No current facility-administered medications for this visit.  ? ? ?Review of Systems  ?Constitutional:  Positive for malaise/fatigue. Negative for chills, diaphoresis, fever and weight loss.  ?     Feels "better" overall.  ?HENT:  Negative for congestion, ear discharge, ear pain, hearing loss, nosebleeds, sinus pain, sore throat and tinnitus.   ?Eyes:  Negative for blurred vision, double vision and photophobia.  ?Respiratory:  Negative for cough, hemoptysis, sputum production and shortness of breath.   ?Cardiovascular:  Negative for chest pain, palpitations, orthopnea and leg swelling.  ?       On Plavix for peripheral vascular disease  ?Gastrointestinal:  Negative for abdominal pain, blood in stool, constipation, diarrhea (on Imodium), heartburn, melena, nausea and vomiting.  ?     IBS.  No prior EGD.  ?Genitourinary:  Negative for dysuria, frequency, hematuria and urgency.  ?Musculoskeletal:  Positive for myalgias (leg cramps). Negative for back pain, joint pain and neck pain.  ?     Chronic leg pain.  ?Skin:  Negative for itching and rash.  ?Neurological: Negative.  Negative for dizziness, tingling, tremors, sensory change, speech change, focal weakness, weakness and headaches.  ?Endo/Heme/Allergies:  Negative for environmental allergies (on Allegra). Does not bruise/bleed easily.  ?Psychiatric/Behavioral:  Positive for depression. Negative for hallucinations and memory loss. The patient is not nervous/anxious and does not have insomnia.   ?Performance status (ECOG):  1 ? ?Vital Signs: ?There were no vitals taken for this  visit. ? ? ?Physical Exam ?Vitals and nursing note reviewed.  ?Constitutional:   ?   General: She is not in acute distress. ?   Appearance: She is well-developed. She is not diaphoretic.  ?HENT:  ?   Head: Normocephal

## 2021-06-05 ENCOUNTER — Other Ambulatory Visit (INDEPENDENT_AMBULATORY_CARE_PROVIDER_SITE_OTHER): Payer: Self-pay | Admitting: Vascular Surgery

## 2021-06-15 ENCOUNTER — Other Ambulatory Visit: Payer: Self-pay | Admitting: Internal Medicine

## 2021-06-15 DIAGNOSIS — F32A Depression, unspecified: Secondary | ICD-10-CM

## 2021-06-19 NOTE — Telephone Encounter (Signed)
Appointment 08/06/21- Rx sent to cover that date Requested Prescriptions  Pending Prescriptions Disp Refills  . sertraline (ZOLOFT) 50 MG tablet [Pharmacy Med Name: Sertraline HCl 50 MG Oral Tablet] 90 tablet 3    Sig: TAKE 1 TABLET BY MOUTH DAILY     Psychiatry:  Antidepressants - SSRI - sertraline Passed - 06/15/2021 10:26 PM      Passed - AST in normal range and within 360 days    AST  Date Value Ref Range Status  05/29/2021 22 15 - 41 U/L Final         Passed - ALT in normal range and within 360 days    ALT  Date Value Ref Range Status  05/29/2021 19 0 - 44 U/L Final         Passed - Completed PHQ-2 or PHQ-9 in the last 360 days      Passed - Valid encounter within last 6 months    Recent Outpatient Visits          2 months ago COVID-19 virus infection   [facility] Glean Hess, MD   2 months ago Type II diabetes mellitus with complication [facility])   [facility] Glean Hess, MD   6 months ago Annual physical exam   [facility] Glean Hess, MD   7 months ago Hemorrhagic cystitis   [facility] Montel Culver, MD   11 months ago Community acquired pneumonia of right upper lobe of lung   [facility] Glean Hess, MD      Future Appointments            In 1 month Army Melia, Jesse Sans, MD [facility], [facility]

## 2021-07-27 ENCOUNTER — Other Ambulatory Visit: Payer: Self-pay | Admitting: Internal Medicine

## 2021-07-27 DIAGNOSIS — I10 Essential (primary) hypertension: Secondary | ICD-10-CM

## 2021-07-30 NOTE — Telephone Encounter (Signed)
Requested Prescriptions  Pending Prescriptions Disp Refills  . lisinopril (ZESTRIL) 20 MG tablet [Pharmacy Med Name: Lisinopril 20 MG Oral Tablet] 90 tablet 0    Sig: TAKE 1 TABLET BY MOUTH DAILY  (REPLACES LISINOPRIL HCT)     Cardiovascular:  ACE Inhibitors Failed - 07/27/2021 10:15 PM      Failed - Cr in normal range and within 180 days    Creatinine  Date Value Ref Range Status  04/27/2013 1.28 0.60 - 1.30 mg/dL Final   Creatinine, Ser  Date Value Ref Range Status  05/29/2021 1.90 (H) 0.44 - 1.00 mg/dL Final   Creatinine, Urine  Date Value Ref Range Status  06/28/2019 189 mg/dL Final         Failed - Last BP in normal range    BP Readings from Last 1 Encounters:  05/31/21 (!) 151/88         Passed - K in normal range and within 180 days    Potassium  Date Value Ref Range Status  05/29/2021 4.8 3.5 - 5.1 mmol/L Final  04/27/2013 4.2 3.5 - 5.1 mmol/L Final         Passed - Patient is not pregnant      Passed - Valid encounter within last 6 months    Recent Outpatient Visits          3 months ago COVID-19 virus infection   [facility] Glean Hess, MD   3 months ago Type II diabetes mellitus with complication [facility])   [facility] Glean Hess, MD   8 months ago Annual physical exam   [facility] Glean Hess, MD   9 months ago Hemorrhagic cystitis   [place], Jason J, MD   1 year ago Community acquired pneumonia of right upper lobe of lung   [facility] Glean Hess, MD      Future Appointments            In 1 week Army Melia Jesse Sans, MD [facility], [facility]

## 2021-08-06 ENCOUNTER — Encounter: Payer: Self-pay | Admitting: Internal Medicine

## 2021-08-06 ENCOUNTER — Encounter: Payer: Self-pay | Admitting: Hematology and Oncology

## 2021-08-06 ENCOUNTER — Ambulatory Visit: Payer: Medicare Other | Admitting: Internal Medicine

## 2021-08-06 VITALS — BP 120/80 | HR 87 | Ht 62.0 in | Wt 184.0 lb

## 2021-08-06 DIAGNOSIS — I10 Essential (primary) hypertension: Secondary | ICD-10-CM

## 2021-08-06 DIAGNOSIS — E118 Type 2 diabetes mellitus with unspecified complications: Secondary | ICD-10-CM

## 2021-08-06 DIAGNOSIS — D801 Nonfamilial hypogammaglobulinemia: Secondary | ICD-10-CM | POA: Diagnosis not present

## 2021-08-06 DIAGNOSIS — F324 Major depressive disorder, single episode, in partial remission: Secondary | ICD-10-CM | POA: Diagnosis not present

## 2021-08-06 LAB — POCT GLYCOSYLATED HEMOGLOBIN (HGB A1C): Hemoglobin A1C: 6.6 % — AB (ref 4.0–5.6)

## 2021-08-06 NOTE — Progress Notes (Signed)
Date:  08/06/2021   Name:  Misty Villarreal   DOB:  October 08, 1950   MRN:  [id number]   Chief Complaint: Diabetes and Hypertension  Hypertension This is a chronic problem. The problem is controlled. Pertinent negatives include no chest pain, headaches, palpitations or shortness of breath. Past treatments include ACE inhibitors and beta blockers. The current treatment provides moderate improvement. Hypertensive end-organ damage includes kidney disease and CAD/MI. There is no history of CVA.  Diabetes She presents for her follow-up diabetic visit. She has type 2 diabetes mellitus. Her disease course has been improving. Pertinent negatives for hypoglycemia include no headaches or tremors. Pertinent negatives for diabetes include no chest pain, no fatigue, no polydipsia and no polyuria. Diabetic complications include heart disease and nephropathy. Pertinent negatives for diabetic complications include no CVA. Current diabetic treatment includes oral agent (monotherapy) Wilder Glade). Her breakfast blood glucose is taken between 6-7 am. Her breakfast blood glucose range is generally 130-140 mg/dl. An ACE inhibitor/angiotensin II receptor blocker is being taken. Eye exam is current.    Lab Results  Component Value Date   NA 133 (L) 05/29/2021   K 4.8 05/29/2021   CO2 23 05/29/2021   GLUCOSE 168 (H) 05/29/2021   BUN 33 (H) 05/29/2021   CREATININE 1.90 (H) 05/29/2021   CALCIUM 9.2 05/29/2021   EGFR 40 (L) 04/02/2021   GFRNONAA 28 (L) 05/29/2021   Lab Results  Component Value Date   CHOL 176 12/01/2020   HDL 52 12/01/2020   LDLCALC 79 12/01/2020   LDLDIRECT 76 07/14/2020   TRIG 275 (H) 12/01/2020   CHOLHDL 3.4 12/01/2020   Lab Results  Component Value Date   TSH 3.640 12/01/2020   Lab Results  Component Value Date   HGBA1C 7.1 (H) 04/02/2021   Lab Results  Component Value Date   WBC 6.9 05/29/2021   HGB 11.8 (L) 05/29/2021   HCT 36.1 05/29/2021   MCV 90.0 05/29/2021   PLT 348  05/29/2021   Lab Results  Component Value Date   ALT 19 05/29/2021   AST 22 05/29/2021   ALKPHOS 99 05/29/2021   BILITOT 0.8 05/29/2021   Lab Results  Component Value Date   VD25OH 21.79 (L) 09/13/2019     Review of Systems  Constitutional:  Negative for appetite change, fatigue, fever and unexpected weight change.  HENT:  Negative for tinnitus and trouble swallowing.   Eyes:  Negative for visual disturbance.  Respiratory:  Negative for cough, chest tightness and shortness of breath.   Cardiovascular:  Negative for chest pain, palpitations and leg swelling.  Gastrointestinal:  Negative for abdominal pain.  Endocrine: Negative for polydipsia and polyuria.  Genitourinary:  Negative for dysuria and hematuria.  Musculoskeletal:  Negative for arthralgias.  Neurological:  Negative for tremors, numbness and headaches.  Psychiatric/Behavioral:  Negative for dysphoric mood.     Patient Active Problem List   Diagnosis Date Noted   CAD (coronary artery disease) 05/14/2021   Hemorrhagic cystitis 10/27/2020   Chronic diarrhea 10/27/2020   Atherosclerosis of native arteries of extremity with intermittent claudication ([place]) 05/15/2020   Palpitations 03/29/2020   Stable angina pectoris ([place]) 03/29/2020   Aortic atherosclerosis ([place]) 11/19/2019   Hyperlipidemia 11/15/2019   Other fatigue 09/07/2019   Stage 3b chronic kidney disease ([place]) 12/23/2018   Restless leg syndrome 12/23/2018   Unruptured synovial cyst of popliteal space 12/23/2018   Plantar fasciitis of left foot 12/23/2018   Iron deficiency anemia 11/26/2018   B12 deficiency 11/26/2018  Hypogammaglobulinemia ([place]) 11/11/2018   Anemia due to stage 3b chronic kidney disease ([place]) 11/11/2018   Osteopenia determined by x-ray 02/16/2018   Major depressive disorder with single episode, in partial remission ([place]) 08/04/2017   Neck pain on right side 08/05/2016   Tinnitus of right ear 08/05/2016   Type II diabetes mellitus with  complication ([place]) 44/81/8563   Lymphedema 06/21/2015   History of paroxysmal supraventricular tachycardia 06/21/2015   PAD (peripheral artery disease) ([place]) 02/02/2015   Hyperlipidemia associated with type 2 diabetes mellitus ([place]) 09/01/2014   Neuropathy 09/01/2014   Phlebectasia 09/01/2014   Essential (primary) hypertension 09/01/2014   Spondylolisthesis at L4-L5 level 07/05/2014   Arthritis, degenerative 01/31/2014    Allergies  Allergen Reactions   Augmentin [Amoxicillin-Pot Clavulanate] Diarrhea   Levaquin [Levofloxacin] Nausea And Vomiting and Other (See Comments)    Ankle pain   Tetracycline     Other reaction(s): emesis   Cefaclor Rash   Cephalexin Rash   Rosuvastatin Diarrhea   Sulfa Antibiotics Rash    Other reaction(s): emesis    Past Surgical History:  Procedure Laterality Date   ANGIOPLASTY / STENTING FEMORAL Left 2014, 2013   BRAIN MENINGIOMA EXCISION  1988   BREAST BIOPSY Right 1989   benign   CARPAL TUNNEL RELEASE Left 1980   CATARACT EXTRACTION W/PHACO Right 09/02/2019   Procedure: CATARACT EXTRACTION PHACO AND INTRAOCULAR LENS PLACEMENT ([place]);  Surgeon: Eulogio Bear, MD;  Location: [facility];  Service: Ophthalmology;  Laterality: Right;  [place] 00:27.3 CDE 1.92 AP% Fluid Pack lot # [id number]   CATARACT EXTRACTION W/PHACO Left 10/18/2019   Procedure: CATARACT EXTRACTION PHACO AND INTRAOCULAR LENS PLACEMENT (IOC) LEFT DIABETIC 2.55  00:28.5;  Surgeon: Eulogio Bear, MD;  Location: [place];  Service: Ophthalmology;  Laterality: Left;  Diabetic - oral meds   CESAREAN SECTION     CHOLECYSTECTOMY  1987   COLONOSCOPY WITH PROPOFOL N/A 05/08/2017   Procedure: COLONOSCOPY WITH PROPOFOL;  Surgeon: Jonathon Bellows, MD;  [facility];  Service: Gastroenterology;  Laterality: N/A;   KNEE ARTHROPLASTY Right 2009   LOWER EXTREMITY ANGIOGRAPHY Left 07/21/2018   Procedure: LOWER EXTREMITY ANGIOGRAPHY;  Surgeon: Katha Cabal, MD;  Location: [place] [facility];  Service: Cardiovascular;  Laterality: Left;    Social History   Tobacco Use   Smoking status: Former    Packs/day: 0.50    Years: 46.00    Total pack years: 23.00    Types: Cigarettes    Quit date: 04/05/2013    Years since quitting: 8.3   Smokeless tobacco: Never  Vaping Use   Vaping Use: Never used  Substance Use Topics   Alcohol use: Yes    Alcohol/week: 2.0 standard drinks of alcohol    Types: 2 Standard drinks or equivalent per week   Drug use: Never     Medication list has been reviewed and updated.  Current Meds  Medication Sig   albuterol (VENTOLIN HFA) 108 (90 Base) MCG/ACT inhaler Inhale 2 puffs into the lungs every 6 (six) hours as needed.   aspirin 81 MG chewable tablet Chew 81 mg by mouth daily.    atorvastatin (LIPITOR) 40 MG tablet TAKE 1 TABLET DAILY   blood glucose meter kit and supplies KIT Dispense based on patient and insurance preference. Use to check BS bid   Cholecalciferol (VITAMIN D) 50 MCG (2000 UT) CAPS Take 2 capsules by mouth daily.   clopidogrel (PLAVIX) 75 MG tablet TAKE 1 TABLET DAILY  Cyanocobalamin (VITAMIN B 12 PO) Take 1,000 mcg by mouth daily. 1060m daily   dapagliflozin propanediol (FARXIGA) 10 MG TABS tablet Take 1 tablet (10 mg total) by mouth daily before breakfast.   fexofenadine (ALLEGRA) 180 MG tablet Take 180 mg by mouth daily.   lisinopril (ZESTRIL) 20 MG tablet TAKE 1 TABLET BY MOUTH DAILY  (REPLACES LISINOPRIL HCT)   meclizine (ANTIVERT) 12.5 MG tablet TAKE 1 TABLET(12.5 MG) BY MOUTH TWICE DAILY AS NEEDED FOR DIZZINESS   metoprolol tartrate (LOPRESSOR) 50 MG tablet Take 50 mg by mouth 2 (two) times daily.   Multiple Vitamin tablet Take 1 tablet by mouth daily.    ondansetron (ZOFRAN-ODT) 4 MG disintegrating tablet Take 1 tablet (4 mg total) by mouth every 8 (eight) hours as needed for nausea or vomiting.   sertraline (ZOLOFT) 50 MG tablet TAKE 1 TABLET BY MOUTH DAILY       08/06/2021    2:57 PM 04/20/2021    10:54 AM 04/02/2021   10:59 AM 12/01/2020   10:35 AM  GAD 7 : Generalized Anxiety Score  Nervous, Anxious, on Edge 0 0 0 0  Control/stop worrying 0 0 0 0  Worry too much - different things 0 0 0 0  Trouble relaxing 0 0 0 0  Restless 0 0 0 0  Easily annoyed or irritable 0 0 0 0  Afraid - awful might happen 0 0 0 0  Total GAD 7 Score 0 0 0 0  Anxiety Difficulty Not difficult at all  Not difficult at all Not difficult at all       08/06/2021    2:57 PM 04/20/2021   10:54 AM 04/02/2021   10:59 AM  Depression screen PHQ 2/9  Decreased Interest 0 0 0  Down, Depressed, Hopeless 0 0 0  PHQ - 2 Score 0 0 0  Altered sleeping 0 1 0  Tired, decreased energy 0 1 1  Change in appetite 0 0 0  Feeling bad or failure about yourself  0 0 0  Trouble concentrating 0 0 0  Moving slowly or fidgety/restless 0 0 0  Suicidal thoughts 0 0 0  PHQ-9 Score 0 2 1  Difficult doing work/chores Not difficult at all  Not difficult at all    BP Readings from Last 3 Encounters:  08/06/21 120/80  05/31/21 (!) 151/88  05/25/21 114/81    Physical Exam Vitals and nursing note reviewed.  Constitutional:      General: She is not in acute distress.    Appearance: She is well-developed.  HENT:     Head: Normocephalic and atraumatic.  Cardiovascular:     Rate and Rhythm: Normal rate and regular rhythm.  Pulmonary:     Effort: Pulmonary effort is normal. No respiratory distress.     Breath sounds: No wheezing or rhonchi.  Musculoskeletal:     Cervical back: Normal range of motion.     Right lower leg: No edema.     Left lower leg: No edema.  Skin:    General: Skin is warm and dry.     Capillary Refill: Capillary refill takes less than 2 seconds.     Findings: No rash.  Neurological:     General: No focal deficit present.     Mental Status: She is alert and oriented to person, place, and time.  Psychiatric:        Mood and Affect: Mood normal.        Behavior: Behavior normal.  Wt Readings  from Last 3 Encounters:  08/06/21 184 lb (83.5 kg)  05/31/21 183 lb (83 kg)  05/25/21 175 lb (79.4 kg)    BP 120/80   Pulse 87   Ht '5\' 2"'  (1.575 m)   Wt 184 lb (83.5 kg)   LMP  (LMP Unknown)   SpO2 97%   BMI 33.65 kg/m   Assessment and Plan: 1. Essential (primary) hypertension Clinically stable exam with well controlled BP. Tolerating medications without side effects at this time. Pt to continue current regimen and low sodium diet; benefits of regular exercise as able discussed.  2. Type II diabetes mellitus with complication ([place]) Clinically stable by exam and report without s/s of hypoglycemia. DM complicated by hypertension and dyslipidemia. Tolerating medications well without side effects or other concerns. Tolerating Farxiga well. - POCT glycosylated hemoglobin (Hb A1C) = 6.9 down from 7.1  3. Hypogammaglobulinemia ([place]) Stable, followed by Oncology  4. Major depressive disorder with single episode, in partial remission (HCC) Clinically stable on current regimen with good control of symptoms, No SI or HI. Will continue current therapy with Sertraline   Partially dictated using Editor, commissioning. Any errors are unintentional.  Halina Maidens, MD [facility]  08/06/2021

## 2021-09-01 ENCOUNTER — Other Ambulatory Visit: Payer: Self-pay | Admitting: Internal Medicine

## 2021-09-01 DIAGNOSIS — F32A Depression, unspecified: Secondary | ICD-10-CM

## 2021-09-03 NOTE — Telephone Encounter (Signed)
Requested Prescriptions  Pending Prescriptions Disp Refills  . sertraline (ZOLOFT) 50 MG tablet [Pharmacy Med Name: Sertraline HCl 50 MG Oral Tablet] 90 tablet 0    Sig: TAKE 1 TABLET BY MOUTH DAILY     Psychiatry:  Antidepressants - SSRI - sertraline Passed - 09/01/2021 11:30 PM      Passed - AST in normal range and within 360 days    AST  Date Value Ref Range Status  05/29/2021 22 15 - 41 U/L Final         Passed - ALT in normal range and within 360 days    ALT  Date Value Ref Range Status  05/29/2021 19 0 - 44 U/L Final         Passed - Completed PHQ-2 or PHQ-9 in the last 360 days      Passed - Valid encounter within last 6 months    Recent Outpatient Visits          4 weeks ago Essential (primary) hypertension   [facility] Glean Hess, MD   4 months ago COVID-19 virus infection   [facility] Glean Hess, MD   5 months ago Type II diabetes mellitus with complication [facility])   [facility] Glean Hess, MD   9 months ago Annual physical exam   [facility] Glean Hess, MD   10 months ago Hemorrhagic cystitis   [facility] Montel Culver, MD      Future Appointments            In 3 months Army Melia Jesse Sans, MD [facility], [facility]

## 2021-10-06 ENCOUNTER — Other Ambulatory Visit: Payer: Self-pay | Admitting: Internal Medicine

## 2021-10-06 DIAGNOSIS — E118 Type 2 diabetes mellitus with unspecified complications: Secondary | ICD-10-CM

## 2021-10-08 NOTE — Telephone Encounter (Signed)
Requested Prescriptions  Pending Prescriptions Disp Refills  . FARXIGA 10 MG TABS tablet [Pharmacy Med Name: Farxiga 10 MG Oral Tablet] 90 tablet 3    Sig: TAKE 1 TABLET BY MOUTH DAILY  BEFORE BREAKFAST     Endocrinology:  Diabetes - SGLT2 Inhibitors Failed - 10/06/2021 10:31 PM      Failed - Cr in normal range and within 360 days    Creatinine  Date Value Ref Range Status  04/27/2013 1.28 0.60 - 1.30 mg/dL Final   Creatinine, Ser  Date Value Ref Range Status  05/29/2021 1.90 (H) 0.44 - 1.00 mg/dL Final   Creatinine, Urine  Date Value Ref Range Status  04/09/2021 243  Final         Failed - eGFR in normal range and within 360 days    EGFR (African American)  Date Value Ref Range Status  04/27/2013 52 (L)  Final   GFR calc Af Amer  Date Value Ref Range Status  06/28/2019 43 (L) >60 mL/min Final   EGFR (Non-African Amer.)  Date Value Ref Range Status  04/27/2013 44 (L)  Final    Comment:    eGFR values <64m/min/1.73 m2 may be an indication of chronic kidney disease (CKD). Calculated eGFR is useful in patients with stable renal function. The eGFR calculation will not be reliable in acutely ill patients when serum creatinine is changing rapidly. It is not useful in  patients on dialysis. The eGFR calculation may not be applicable to patients at the low and high extremes of body sizes, pregnant women, and vegetarians.    GFR, Estimated  Date Value Ref Range Status  05/29/2021 28 (L) >60 mL/min Final    Comment:    (NOTE) Calculated using the CKD-EPI Creatinine Equation (2021)    eGFR  Date Value Ref Range Status  04/02/2021 40 (L) >59 mL/min/1.73 Final         Passed - HBA1C is between 0 and 7.9 and within 180 days    Hemoglobin A1C  Date Value Ref Range Status  08/06/2021 6.6 (A) 4.0 - 5.6 % Final   Hgb A1c MFr Bld  Date Value Ref Range Status  04/02/2021 7.1 (H) 4.8 - 5.6 % Final    Comment:             Prediabetes: 5.7 - 6.4          Diabetes: >6.4           Glycemic control for adults with diabetes: <7.0          Passed - Valid encounter within last 6 months    Recent Outpatient Visits          2 months ago Essential (primary) hypertension   [place] [facility]   5 months ago COVID-19 virus infection   [place] [facility]   6 months ago Type II diabetes mellitus with complication ([place]   [place] [facility]   10 months ago Annual physical exam   [facility]   11 months ago Hemorrhagic cystitis   [place] [facility] JE[facility]ley Abide [facility]      Future Appointments  In 2 months Army Melia, Jesse Sans, MD [place] [facility] at [facility], [facility]

## 2021-10-16 ENCOUNTER — Other Ambulatory Visit: Payer: Self-pay | Admitting: Internal Medicine

## 2021-10-16 MED ORDER — MECLIZINE HCL 12.5 MG PO TABS
12.5000 mg | ORAL_TABLET | Freq: Two times a day (BID) | ORAL | 0 refills | Status: DC | PRN
Start: 2021-10-16 — End: 2021-12-25

## 2021-10-20 ENCOUNTER — Other Ambulatory Visit: Payer: Self-pay | Admitting: Internal Medicine

## 2021-10-20 DIAGNOSIS — I10 Essential (primary) hypertension: Secondary | ICD-10-CM

## 2021-10-22 NOTE — Telephone Encounter (Signed)
Requested Prescriptions  Pending Prescriptions Disp Refills  . lisinopril (ZESTRIL) 20 MG tablet [Pharmacy Med Name: Lisinopril 20 MG Oral Tablet] 90 tablet 0    Sig: TAKE 1 TABLET BY MOUTH DAILY  (REPLACES LISINOPRIL HCT)     Cardiovascular:  ACE Inhibitors Failed - 10/20/2021 11:07 PM      Failed - Cr in normal range and within 180 days    Creatinine  Date Value Ref Range Status  04/27/2013 1.28 0.60 - 1.30 mg/dL Final   Creatinine, Ser  Date Value Ref Range Status  05/29/2021 1.90 (H) 0.44 - 1.00 mg/dL Final   Creatinine, Urine  Date Value Ref Range Status  04/09/2021 243  Final         Passed - K in normal range and within 180 days    Potassium  Date Value Ref Range Status  05/29/2021 4.8 3.5 - 5.1 mmol/L Final  04/27/2013 4.2 3.5 - 5.1 mmol/L Final         Passed - Patient is not pregnant      Passed - Last BP in normal range    BP Readings from Last 1 Encounters:  08/06/21 120/80         Passed - Valid encounter within last 6 months    Recent Outpatient Visits          2 months ago Essential (primary) hypertension   [place] [facility] at [facility], Jesse Sans, MD   6 months ago COVID-19 virus infection   [place] [facility] at [facility], Jesse Sans, MD   6 months ago Type II diabetes mellitus with complication [facility])   [place] [facility] at [facility], Jesse Sans, MD   10 months ago Annual physical exam   [facility] at [facility], Jesse Sans, MD   12 months ago Hemorrhagic cystitis   [place] [facility] at [facility], Earley Abide, MD      Future Appointments            In 1 month Army Melia, Jesse Sans, MD [place] [facility] at [facility], [facility]

## 2021-10-24 ENCOUNTER — Ambulatory Visit: Payer: Medicare HMO

## 2021-11-12 ENCOUNTER — Encounter: Payer: Self-pay | Admitting: Hematology and Oncology

## 2021-11-14 ENCOUNTER — Ambulatory Visit (INDEPENDENT_AMBULATORY_CARE_PROVIDER_SITE_OTHER): Payer: Medicare Other

## 2021-11-14 VITALS — Ht 62.0 in | Wt 184.0 lb

## 2021-11-14 DIAGNOSIS — Z122 Encounter for screening for malignant neoplasm of respiratory organs: Secondary | ICD-10-CM

## 2021-11-14 DIAGNOSIS — Z0000 Encounter for general adult medical examination without abnormal findings: Secondary | ICD-10-CM

## 2021-11-14 NOTE — Progress Notes (Signed)
Subjective:   Misty Villarreal is a 71 y.o. female who presents for Medicare Annual (Subsequent) preventive examination.  I connected with  Margree Gimbel Franzel on 11/14/21 by a audio enabled telemedicine application and verified that I am speaking with the correct person using two identifiers.  Patient Location: Home  Provider Location: Office/Clinic  I discussed the limitations of evaluation and management by telemedicine. The patient expressed understanding and agreed to proceed.    Review of Systems    Defer to PCP Cardiac Risk Factors include: advanced age (>73mn, >>64women);diabetes mellitus     Objective:    Today's Vitals   11/14/21 1358 11/14/21 1406  Weight: 184 lb (83.5 kg)   Height: '5\' 2"'  (1.575 m)   PainSc: 0-No pain 0-No pain   Body mass index is 33.65 kg/m.     11/14/2021    2:08 PM 05/31/2021    1:29 PM 05/25/2021   12:49 PM 10/23/2020   11:35 AM 07/27/2020   10:39 AM 12/28/2019    1:08 PM 10/20/2019   11:46 AM  Advanced Directives  Does Patient Have a Medical Advance Directive? No No No No No No No  Does patient want to make changes to medical advance directive? No - Patient declined   No - Patient declined No - Patient declined No - Patient declined   Would patient like information on creating a medical advance directive?     No - Patient declined No - Patient declined Yes (MAU/Ambulatory/Procedural Areas - Information given)    Current Medications (verified) Outpatient Encounter Medications as of 11/14/2021  Medication Sig   albuterol (VENTOLIN HFA) 108 (90 Base) MCG/ACT inhaler Inhale 2 puffs into the lungs every 6 (six) hours as needed.   aspirin 81 MG chewable tablet Chew 81 mg by mouth daily.    atorvastatin (LIPITOR) 40 MG tablet TAKE 1 TABLET DAILY   blood glucose meter kit and supplies KIT Dispense based on patient and insurance preference. Use to check BS bid   Cholecalciferol (VITAMIN D) 50 MCG (2000 UT) CAPS Take 2 capsules by mouth daily.    clopidogrel (PLAVIX) 75 MG tablet TAKE 1 TABLET DAILY   Cyanocobalamin (VITAMIN B 12 PO) Take 1,000 mcg by mouth daily. 10031mdaily   FARXIGA 10 MG TABS tablet TAKE 1 TABLET BY MOUTH DAILY  BEFORE BREAKFAST   fexofenadine (ALLEGRA) 180 MG tablet Take 180 mg by mouth daily.   lisinopril (ZESTRIL) 20 MG tablet TAKE 1 TABLET BY MOUTH DAILY  (REPLACES LISINOPRIL HCT)   meclizine (ANTIVERT) 12.5 MG tablet Take 1 tablet (12.5 mg total) by mouth 2 (two) times daily as needed for dizziness.   metoprolol tartrate (LOPRESSOR) 50 MG tablet Take 50 mg by mouth 2 (two) times daily.   Multiple Vitamin tablet Take 1 tablet by mouth daily.    ondansetron (ZOFRAN-ODT) 4 MG disintegrating tablet Take 1 tablet (4 mg total) by mouth every 8 (eight) hours as needed for nausea or vomiting.   sertraline (ZOLOFT) 50 MG tablet TAKE 1 TABLET BY MOUTH DAILY   No facility-administered encounter medications on file as of 11/14/2021.    Allergies (verified) Augmentin [amoxicillin-pot clavulanate], Levaquin [levofloxacin], Tetracycline, Cefaclor, Cephalexin, Rosuvastatin, and Sulfa antibiotics   History: Past Medical History:  Diagnosis Date   Abnormal glandular Papanicolaou smear of cervix 09/01/2014   Normal Pap 2012    Anemia    Chronic kidney disease    stage 3   Depression    Diabetes mellitus without complication (  [place])    DVT (deep venous thrombosis) ([place])    Hyperlipidemia    Hypertension    Peripheral vascular disease ([place])    Peripheral vascular disease ([place])    Slurred speech 12/03/2016   Past Surgical History:  Procedure Laterality Date   ANGIOPLASTY / STENTING FEMORAL Left 2014, 2013   [place]   BREAST BIOPSY Right 1989   benign   CARPAL TUNNEL RELEASE Left 1980   CATARACT EXTRACTION W/PHACO Right 09/02/2019   Procedure: CATARACT EXTRACTION PHACO AND INTRAOCULAR LENS PLACEMENT ([place]);  Surgeon: Eulogio Bear, MD;  Location: [facility] ORS;  Service: Ophthalmology;   Laterality: Right;  [place] 00:27.3 CDE 1.92 AP% Fluid Pack lot # [id number]   CATARACT EXTRACTION W/PHACO Left 10/18/2019   Procedure: CATARACT EXTRACTION PHACO AND INTRAOCULAR LENS PLACEMENT (I[facility]) LEFT DIABETIC 2.55  00:28.5;  Surgeon: Eulogio Bear, MD;  Location: [place];  Service: Ophthalmology;  Laterality: Left;  Diabetic - oral meds   CESAREAN SECTION     CHOLECYSTECTOMY  1987   COLONOSCOPY WITH PROPOFOL N/A 05/08/2017   Procedure: COLONOSCOPY WITH PROPOFOL;  Surgeon: Jonathon Bellows, MD;  Location: [facility] END[facility]COPY;  Service: Gastroenterology;  Laterality: N/A;   KNEE ARTHROPLASTY Right 2009   LOWER EXTREMITY ANGIOGRAPHY Left 07/21/2018   Procedure: LOWER EXTREMITY ANGIOGRAPHY;  Surgeon: Katha Cabal, MD;  Location: [place] [facility];  Service: Cardiovascular;  Laterality: Left;   Family History  Problem Relation Age of Onset   Diabetes Mother    Heart failure Father    Diabetes Father    Breast cancer Neg Hx    Social History   Socioeconomic History   Marital status: Widowed    Spouse name: Not on file   Number of children: 2   Years of education: Not on file   Highest education level: Not on file  Occupational History   Not on file  Tobacco Use   Smoking status: Former    Packs/day: 0.50    Years: 46.00    Total pack years: 23.00    Types: Cigarettes    Quit date: 04/05/2013    Years since quitting: 8.6   Smokeless tobacco: Never  Vaping Use   Vaping Use: Never used  Substance and Sexual Activity   Alcohol use: Yes    Alcohol/week: 2.0 standard drinks of alcohol    Types: 2 Standard drinks or equivalent per week   Drug use: Never   Sexual activity: Not Currently    Partners: Male  Other Topics Concern   Not on file  Social History Narrative   Pt lives alone   Social Determinants of Health   Financial Resource Strain: Low Risk  (11/14/2021)   Overall Financial Resource Strain (CARDIA)    Difficulty of Paying Living Expenses: Not hard at all   Food Insecurity: No Food Insecurity (11/14/2021)   Hunger Vital Sign    Worried About Running Out of Food in the Last Year: Never true    Ran Out of Food in the Last Year: Never true  Transportation Needs: No Transportation Needs (11/14/2021)   PRAPARE - Hydrologist (Medical): No    Lack of Transportation (Non-Medical): No  Physical Activity: Insufficiently Active (11/14/2021)   Exercise Vital Sign    Days of Exercise per Week: 7 days    Minutes of Exercise per Session: 20 min  Stress: No Stress Concern Present (11/14/2021)   [place]  Stress Questionnaire    Feeling of Stress : Not at all  Social Connections: Socially Isolated (11/14/2021)   Social Connection and Isolation Panel [NHANES]    Frequency of Communication with Friends and Family: More than three times a week    Frequency of Social Gatherings with Friends and Family: More than three times a week    Attends Religious Services: Never    Marine scientist or Organizations: No    Attends Archivist Meetings: Never    Marital Status: Widowed    Tobacco Counseling Counseling given: Not Answered   Clinical Intake:  Pre-visit preparation completed: Yes  Pain : No/denies pain Pain Score: 0-No pain     Diabetes: Yes     Diabetic? Yes     Information entered by :: Wyatt Haste, [place] of Daily Living    11/14/2021    2:09 PM 04/20/2021   10:54 AM  In your present state of health, do you have any difficulty performing the following activities:  Hearing? 0 0  Vision? 0 0  Difficulty concentrating or making decisions? 0 0  Walking or climbing stairs? 0 0  Dressing or bathing? 0 0  Doing errands, shopping?  0  Preparing Food and eating ? N   Using the Toilet? N   In the past six months, have you accidently leaked urine? N   Do you have problems with loss of bowel control? N   Managing your Medications?  N   Managing your Finances? N   Housekeeping or managing your Housekeeping? N     Patient Care Team: Glean Hess, MD as PCP - General (Internal Medicine) Delana Meyer, Dolores Lory, MD (Vascular Surgery) Anthonette Legato, MD (Nephrology)  Indicate any recent Medical Services you may have received from other than [facility] providers in the past year (date may be approximate).     Assessment:   This is a routine wellness examination for [place].  Hearing/Vision screen Hearing Screening - Comments:: No concerns. Vision Screening - Comments:: Wears glasses.  Dietary issues and exercise activities discussed: Current Exercise Habits: The patient does not participate in regular exercise at present   Goals Addressed   None   Depression Screen    11/14/2021    2:07 PM 08/06/2021    2:57 PM 04/20/2021   10:54 AM 04/02/2021   10:59 AM 12/01/2020   10:34 AM 10/27/2020    4:21 PM 10/23/2020   11:32 AM  PHQ 2/9 Scores  PHQ - 2 Score 0 0 0 0 0 0 0  PHQ- 9 Score 0 0 2 1 0 0     Fall Risk    11/14/2021    2:09 PM 08/06/2021    2:57 PM 04/20/2021   10:54 AM 04/02/2021   11:00 AM 12/01/2020   10:35 AM  Fall Risk   Falls in the past year? 1 0 0 0 0  Number falls in past yr: 1 0 0 0 0  Injury with Fall? 0 0 0 0 0  Risk for fall due to : History of fall(s) No Fall Risks No Fall Risks No Fall Risks No Fall Risks  Follow up Falls evaluation completed Falls evaluation completed Falls evaluation completed Falls evaluation completed Falls evaluation completed    [place]:  Any stairs in or around the home? Yes  If so, are there any without handrails? Yes  Home free of loose throw rugs in walkways, pet beds, electrical  cords, etc? Yes  Adequate lighting in your home to reduce risk of falls? Yes   ASSISTIVE DEVICES UTILIZED TO PREVENT FALLS:  Life alert? No  Use of a cane, walker or w/c? No  Grab bars in the bathroom? Yes  Shower chair or bench in shower? Yes   Elevated toilet seat or a handicapped toilet? No   Cognitive Function:        11/14/2021    2:09 PM 10/20/2019   11:44 AM 08/04/2017   10:55 AM 08/05/2016   10:27 AM  6CIT Screen  What Year? 0 points 0 points 0 points 0 points  What month? 0 points 0 points 0 points 0 points  What time? 0 points 0 points 0 points 0 points  Count back from 20 0 points 0 points 0 points 0 points  Months in reverse 0 points 0 points 0 points 0 points  Repeat phrase 0 points 0 points 0 points 0 points  Total Score 0 points 0 points 0 points 0 points    Immunizations Immunization History  Administered Date(s) Administered   Fluad Quad(high Dose 65+) 10/23/2020   Influenza, High Dose Seasonal PF 11/13/2016, 11/12/2018   Influenza-Unspecified 11/21/2016, 12/18/2017, 11/22/2019   PFIZER(Purple Top)SARS-COV-2 Vaccination 03/24/2019, 04/14/2019   Pneumococcal Conjugate-13 08/04/2015   Pneumococcal Polysaccharide-23 12/15/2012, 09/09/2018   Zoster Recombinat (Shingrix) 12/04/2017, 11/12/2018    TDAP status: Due, Education has been provided regarding the importance of this vaccine. Advised may receive this vaccine at local pharmacy or Health Dept. Aware to provide a copy of the vaccination record if obtained from local pharmacy or Health Dept. Verbalized acceptance and understanding.  Flu Vaccine status: Due, Education has been provided regarding the importance of this vaccine. Advised may receive this vaccine at local pharmacy or Health Dept. Aware to provide a copy of the vaccination record if obtained from local pharmacy or Health Dept. Verbalized acceptance and understanding.  Pneumococcal vaccine status: Up to date  Covid-19 vaccine status: Completed vaccines  Qualifies for Shingles Vaccine? Yes   Zostavax completed No   Shingrix Completed?: Yes  Screening Tests Health Maintenance  Topic Date Due   TETANUS/TDAP  Never done   Lung Cancer Screening  11/15/2020   INFLUENZA VACCINE  08/21/2021    COVID-19 Vaccine (3 - Pfizer series) 11/30/2021 (Originally 06/09/2019)   OPHTHALMOLOGY EXAM  11/24/2021   MAMMOGRAM  01/31/2022   HEMOGLOBIN A1C  02/06/2022   Diabetic kidney evaluation - GFR measurement  05/30/2022   FOOT EXAM  08/07/2022   Diabetic kidney evaluation - Urine ACR  08/14/2022   Medicare Annual Wellness (AWV)  12/15/2022   COLONOSCOPY (Pts 45-18yr Insurance coverage will need to be confirmed)  05/09/2027   Pneumonia Vaccine 72 Years old  Completed   DEXA SCAN  Completed   Zoster Vaccines- Shingrix  Completed   Hepatitis C Screening  Addressed   HPV VACCINES  Aged Out    Health Maintenance  Health Maintenance Due  Topic Date Due   TETANUS/TDAP  Never done   Lung Cancer Screening  11/15/2020   INFLUENZA VACCINE  08/21/2021    Colorectal cancer screening: Type of screening: Colonoscopy. Completed 05/08/2017. Repeat every 10 years  Mammogram status: Completed 01/31/2021. Repeat every year  Bone Density status: Completed 02/16/2018. Results reflect: Bone density results: OSTEOPENIA. Repeat every 2 years.  Lung Cancer Screening: (Low Dose CT Chest recommended if Age 72-80years, 30 pack-year currently smoking OR have quit w/in 15years.) does qualify.   Lung Cancer Screening Referral:  no.  Additional Screening:  Hepatitis C Screening: does qualify; Completed 05/22/2014  Vision Screening: Recommended annual ophthalmology exams for early detection of glaucoma and other disorders of the eye. Is the patient up to date with their annual eye exam?  Yes  Who is the provider or what is the name of the office in which the patient attends annual eye exams? Dr Edison P[facility] If pt is not established with a provider, would they like to be referred to a provider to establish care?  N/A .   Dental Screening: Recommended annual dental exams for proper oral hygiene  Community Resource Referral / Chronic Care Management: CRR required this visit?  No   CCM  required this visit?  No      Plan:     I have personally reviewed and noted the following in the patient's chart:   Medical and social history Use of alcohol, tobacco or illicit drugs  Current medications and supplements including opioid prescriptions. Patient is not currently taking opioid prescriptions. Functional ability and status Nutritional status Physical activity Advanced directives List of other physicians Hospitalizations, surgeries, and ER visits in previous 12 months Vitals Screenings to include cognitive, depression, and falls Referrals and appointments  In addition, I have reviewed and discussed with patient certain preventive protocols, quality metrics, and best practice recommendations. A written personalized care plan for preventive services as well as general preventive health recommendations were provided to patient.     Clista Bernhardt, [place]   11/14/2021    Ms. Boxx , Thank you for taking time to come for your Medicare Wellness Visit. I appreciate your ongoing commitment to your health goals. Please review the following plan we discussed and let me know if I can assist you in the future.   These are the goals we discussed:  Goals      Increase physical activity     Pt would like to increase physical activity to at least 3 days per week     Patient Stated     Patient states she would like to lower A1c back to less than 7% and increase activity         This is a list of the screening recommended for you and due dates:  Health Maintenance  Topic Date Due   Tetanus Vaccine  Never done   Screening for Lung Cancer  11/15/2020   Flu Shot  08/21/2021   COVID-19 Vaccine (3 - Pfizer series) 11/30/2021*   Eye exam for diabetics  11/24/2021   Mammogram  01/31/2022   Hemoglobin A1C  02/06/2022   Yearly kidney function blood test for diabetes  05/30/2022   Complete foot exam   08/07/2022   Yearly kidney health urinalysis for diabetes  08/14/2022    Medicare Annual Wellness Visit  12/15/2022   Colon Cancer Screening  05/09/2027   Pneumonia Vaccine  Completed   DEXA scan (bone density measurement)  Completed   Zoster (Shingles) Vaccine  Completed   Hepatitis C Screening: USPSTF Recommendation to screen - Ages 76-79 yo.  Addressed   HPV Vaccine  Aged Out  *Topic was postponed. The date shown is not the original due date.      Nurse Notes: NONE.

## 2021-11-19 ENCOUNTER — Encounter (INDEPENDENT_AMBULATORY_CARE_PROVIDER_SITE_OTHER): Payer: Self-pay

## 2021-11-25 ENCOUNTER — Other Ambulatory Visit: Payer: Self-pay | Admitting: Internal Medicine

## 2021-11-25 DIAGNOSIS — F32A Depression, unspecified: Secondary | ICD-10-CM

## 2021-11-26 ENCOUNTER — Other Ambulatory Visit: Payer: Self-pay

## 2021-11-26 DIAGNOSIS — N1832 Chronic kidney disease, stage 3b: Secondary | ICD-10-CM

## 2021-11-26 NOTE — Telephone Encounter (Signed)
Requested Prescriptions  Pending Prescriptions Disp Refills   sertraline (ZOLOFT) 50 MG tablet [Pharmacy Med Name: Sertraline HCl 50 MG Oral Tablet] 90 tablet 0    Sig: TAKE 1 TABLET BY MOUTH DAILY     Psychiatry:  Antidepressants - SSRI - sertraline Passed - 11/25/2021 10:10 PM      Passed - AST in normal range and within 360 days    AST  Date Value Ref Range Status  05/29/2021 22 15 - 41 U/L Final         Passed - ALT in normal range and within 360 days    ALT  Date Value Ref Range Status  05/29/2021 19 0 - 44 U/L Final         Passed - Completed PHQ-2 or PHQ-9 in the last 360 days      Passed - Valid encounter within last 6 months    Recent Outpatient Visits           3 months ago Essential (primary) hypertension   [place] [facility] at [facility], Jesse Sans, MD   7 months ago COVID-19 virus infection   [place] [facility] at [facility], Jesse Sans, MD   7 months ago Type II diabetes mellitus with complication [facility])   [place] and [facility] Medicine at [facility], Jesse Sans, MD   12 months ago Annual physical exam   [facility] at [facility], Jesse Sans, MD   1 year ago Hemorrhagic cystitis   [place] [facility] at [facility], Earley Abide, MD       Future Appointments             In 1 week Army Melia, Jesse Sans, MD [place] [facility] at [facility], [facility]

## 2021-11-27 ENCOUNTER — Inpatient Hospital Stay: Payer: Medicare Other | Attending: Oncology

## 2021-11-27 DIAGNOSIS — E538 Deficiency of other specified B group vitamins: Secondary | ICD-10-CM | POA: Insufficient documentation

## 2021-11-27 DIAGNOSIS — K589 Irritable bowel syndrome without diarrhea: Secondary | ICD-10-CM | POA: Diagnosis not present

## 2021-11-27 DIAGNOSIS — E1122 Type 2 diabetes mellitus with diabetic chronic kidney disease: Secondary | ICD-10-CM | POA: Diagnosis not present

## 2021-11-27 DIAGNOSIS — D631 Anemia in chronic kidney disease: Secondary | ICD-10-CM | POA: Insufficient documentation

## 2021-11-27 DIAGNOSIS — N1832 Chronic kidney disease, stage 3b: Secondary | ICD-10-CM | POA: Insufficient documentation

## 2021-11-27 LAB — CBC WITH DIFFERENTIAL/PLATELET
Abs Immature Granulocytes: 0.02 10*3/uL (ref 0.00–0.07)
Basophils Absolute: 0.1 10*3/uL (ref 0.0–0.1)
Basophils Relative: 1 %
Eosinophils Absolute: 0.2 10*3/uL (ref 0.0–0.5)
Eosinophils Relative: 3 %
HCT: 37.6 % (ref 36.0–46.0)
Hemoglobin: 12.5 g/dL (ref 12.0–15.0)
Immature Granulocytes: 0 %
Lymphocytes Relative: 22 %
Lymphs Abs: 1.4 10*3/uL (ref 0.7–4.0)
MCH: 29.4 pg (ref 26.0–34.0)
MCHC: 33.2 g/dL (ref 30.0–36.0)
MCV: 88.5 fL (ref 80.0–100.0)
Monocytes Absolute: 0.5 10*3/uL (ref 0.1–1.0)
Monocytes Relative: 8 %
Neutro Abs: 3.9 10*3/uL (ref 1.7–7.7)
Neutrophils Relative %: 66 %
Platelets: 261 10*3/uL (ref 150–400)
RBC: 4.25 MIL/uL (ref 3.87–5.11)
RDW: 12.5 % (ref 11.5–15.5)
WBC: 6.1 10*3/uL (ref 4.0–10.5)
nRBC: 0 % (ref 0.0–0.2)

## 2021-11-27 LAB — IRON AND TIBC
Iron: 90 ug/dL (ref 28–170)
Saturation Ratios: 27 % (ref 10.4–31.8)
TIBC: 336 ug/dL (ref 250–450)
UIBC: 246 ug/dL

## 2021-11-27 LAB — COMPREHENSIVE METABOLIC PANEL
ALT: 17 U/L (ref 0–44)
AST: 20 U/L (ref 15–41)
Albumin: 4 g/dL (ref 3.5–5.0)
Alkaline Phosphatase: 74 U/L (ref 38–126)
Anion gap: 10 (ref 5–15)
BUN: 37 mg/dL — ABNORMAL HIGH (ref 8–23)
CO2: 21 mmol/L — ABNORMAL LOW (ref 22–32)
Calcium: 9 mg/dL (ref 8.9–10.3)
Chloride: 104 mmol/L (ref 98–111)
Creatinine, Ser: 1.77 mg/dL — ABNORMAL HIGH (ref 0.44–1.00)
GFR, Estimated: 30 mL/min — ABNORMAL LOW (ref >60)
Glucose, Bld: 195 mg/dL — ABNORMAL HIGH (ref 70–99)
Potassium: 4.2 mmol/L (ref 3.5–5.1)
Sodium: 135 mmol/L (ref 135–145)
Total Bilirubin: 1.1 mg/dL (ref 0.3–1.2)
Total Protein: 7.2 g/dL (ref 6.5–8.1)

## 2021-11-27 LAB — RETIC PANEL
Immature Retic Fract: 9 % (ref 2.3–15.9)
RBC.: 4.25 MIL/uL (ref 3.87–5.11)
Retic Count, Absolute: 74 10*3/uL (ref 19.0–186.0)
Retic Ct Pct: 1.7 % (ref 0.4–3.1)
Reticulocyte Hemoglobin: 34.8 pg (ref >27.9)

## 2021-11-27 LAB — FERRITIN: Ferritin: 66 ng/mL (ref 11–307)

## 2021-11-27 LAB — VITAMIN B12: Vitamin B-12: 256 pg/mL (ref 180–914)

## 2021-11-28 MED FILL — Iron Sucrose Inj 20 MG/ML (Fe Equiv): INTRAVENOUS | Qty: 10 | Status: AC

## 2021-11-29 ENCOUNTER — Ambulatory Visit: Payer: Medicare Other | Admitting: Oncology

## 2021-11-29 ENCOUNTER — Inpatient Hospital Stay: Payer: Medicare Other | Admitting: Oncology

## 2021-11-29 ENCOUNTER — Encounter: Payer: Self-pay | Admitting: Oncology

## 2021-11-29 ENCOUNTER — Ambulatory Visit: Payer: Medicare Other

## 2021-11-29 ENCOUNTER — Inpatient Hospital Stay: Payer: Medicare Other

## 2021-11-29 VITALS — BP 152/70 | HR 59 | Temp 96.4°F | Wt 189.8 lb

## 2021-11-29 DIAGNOSIS — D631 Anemia in chronic kidney disease: Secondary | ICD-10-CM

## 2021-11-29 DIAGNOSIS — E538 Deficiency of other specified B group vitamins: Secondary | ICD-10-CM | POA: Diagnosis not present

## 2021-11-29 DIAGNOSIS — N1832 Chronic kidney disease, stage 3b: Secondary | ICD-10-CM

## 2021-11-29 NOTE — Assessment & Plan Note (Signed)
#  Anemia due to chronic kidney disease. Labs reviewed and discussed with patient. Hemoglobin is normal. Iron panel is stable.  Hold off additional IV Venofer treatment at this point.  Observation.

## 2021-11-29 NOTE — Assessment & Plan Note (Signed)
Recommend Vitamin B12 1056mg daily

## 2021-11-29 NOTE — Progress Notes (Signed)
Hematology/Oncology Progress note [phone] [phone]) [phone]         ASSESSMENT & PLAN:   Anemia due to stage 3b chronic kidney disease #Anemia due to chronic kidney disease. Labs reviewed and discussed with patient. Hemoglobin is normal. Iron panel is stable.  Hold off additional IV Venofer treatment at this point.  Observation.  B12 deficiency Recommend Vitamin B12 1019mg daily   Orders Placed This Encounter  Procedures   CBC with Differential/Platelet    Standing Status:   Future    Standing Expiration Date:   11/30/2022   Comprehensive metabolic panel    Standing Status:   Future    Standing Expiration Date:   11/29/2022   Iron and TIBC    Standing Status:   Future    Standing Expiration Date:   11/30/2022   Ferritin    Standing Status:   Future    Standing Expiration Date:   11/30/2022   Vitamin B12    Standing Status:   Future    Standing Expiration Date:   11/30/2022   Retic Panel    Standing Status:   Future    Standing Expiration Date:   11/30/2022   Follow up in 6 months.  All questions were answered. The patient knows to call the clinic with any problems, questions or concerns.  ZEarlie Server MD, PhD [facility] 11/29/2021    Chief Complaint: CDarianna Amyis a 71y.o. female presents to follow up for anemia in CKD, hypogammaglobulinemia   PERTINENT HEMATOLOGY HISTORY  Patient previously followed up by Misty Villarreal, patient switched care to me on 07/27/20 Extensive medical record review was performed by me  #Anemia in chronic kidney disease. 11/11/2018  protein electrophoresis showed no M spike  Patient received IV Venofer treatments  #Vitamin B12 deficiency 11/11/2018, vitamin B12 was 104.   Patient is on oral vitamin B12 supplementation. Anti-parietal antibody and intrinsic factor antibody were normal on 12/14/2018.   #stage IIIB chronic kidney disease felt secondary to diabetes.   11/18/2018 UPEP 24 h  negative for M protein. Serum light chain ratio normal.  Patient follows up with nephrology Misty Villarreal   05/08/2017 colonoscopy revealed diverticulosis and non-bleeding internal hemorrhoids.  #Former smoker, Low dose chest CT on 11/16/2019 revealed Lung-RADS 2S, benign appearance or behavior. There was mild diffuse bronchial wall thickening with very mild centrilobular and paraseptal emphysema; imaging findings suggestive of underlying COPD. There were calcifications of the aortic valve. Echocardiographic correlation for evaluation of potential valvular dysfunction may be warranted if clinically indicated.   INTERVAL HISTORY CTrenee Igoeis a 71y.o. female who has above history reviewed by me today presents for follow up visit for anemia Previously tolerated IV Venofer treatments well.  Chronic IBS symptoms. She currently takes vitamin B12 1000 MCG daevery other day  Past Medical History:  Diagnosis Date   Abnormal glandular Papanicolaou smear of cervix 09/01/2014   Normal Pap 2012    Anemia    Chronic kidney disease    stage 3   Depression    Diabetes mellitus without complication (HCC)    DVT (deep venous thrombosis) (HCC)    Hyperlipidemia    Hypertension    [place]ipheral [place] disease [place]    Peripheral vascular disease ([place]    Slurred speech 12/03/2016    Past Surgical History:  Procedure Laterality Date   ANGIOPLASTY / STENTING F[place]ORAL [place]   CARPAL TUNNEL RELEASE Left 1980   CATARACT EXTRACTION W/PHACO Right 09/02/2019   Procedure: CATARACT EXTRACTION PHACO AND INTRAOCULAR LENS PLACEMENT (IOC);  Surgeon: Misty Bear, MD;  Location: [facility] OR[facility];  Service: Ophthalmology;  Laterality: Right;  [place] 00:27.3 CDE 1.92 AP% Fluid Pack lot # [id number]   CATARACT EXTRACTION W/PHACO Left 10/18/2019   Procedure: CATARACT EXTRACTION PHACO AND INTRAOCULAR LENS PLACEMENT (IOC) LEFT DIABETIC 2.55   00:28.5;  Surgeon: Misty Bear, MD;  Location: [facility];  Service: Ophthalmology;  Laterality: Left;  Diabetic - oral meds   CESAREAN SECTION     CHOLECYSTECTOMY  1987   COLONOSCOPY WITH PROPOFOL N/A 05/08/2017   Procedure: COLONOSCOPY WITH PROPOFOL;  Surgeon: Misty Bellows, MD;  Location: [facility] ENDOSCOPY;  Service: Gastroenterology;  Laterality: N/A;   KNEE ARTHROPLASTY Right 2009   LOWER EXTREMITY ANGIOGRAPHY Left 07/21/2018   Procedure: LOWER EXTREMITY ANGIOGRAPHY;  Surgeon: Misty Cabal, MD;  Location: [facility];  Service: Cardiovascular;  Laterality: Left;    Family History  Problem Relation Age of Onset   Diabetes Mother    Heart failure Father    Diabetes Father    Breast cancer Neg Hx     Social History:  reports that she quit smoking about 8 years ago. Her smoking use included cigarettes. She has a 23.00 pack-year smoking history. She has never used smokeless tobacco. She reports current alcohol use of about 2.0 standard drinks of alcohol per week. She reports that she does not use drugs. She previously smoked a pack of cigarettes a day for 40 years.  She drinks red wine occasionally.    Her husband, Misty Villarreal, died in 06/28/18.    Allergies:  Allergies  Allergen Reactions   Augmentin [Amoxicillin-Pot Clavulanate] Diarrhea   Levaquin [Levofloxacin] Nausea And Vomiting and Other (See Comments)    Ankle pain   Tetracycline     Other reaction(s): emesis   Cefaclor Rash   Cephalexin Rash   Rosuvastatin Diarrhea   Sulfa Antibiotics Rash    Other reaction(s): emesis    Current Medications: Current Outpatient Medications  Medication Sig Dispense Refill   albuterol (VENTOLIN HFA) 108 (90 Base) MCG/ACT inhaler Inhale 2 puffs into the lungs every 6 (six) hours as needed. 54 g 0   aspirin 81 MG chewable tablet Chew 81 mg by mouth daily.      atorvastatin (LIPITOR) 40 MG tablet TAKE 1 TABLET DAILY 90 tablet 3   blood glucose meter kit and supplies KIT  Dispense based on patient and insurance preference. Use to check BS bid 1 each 0   Cholecalciferol (VITAMIN D) 50 MCG (2000 UT) CAPS Take 2 capsules by mouth daily.     clopidogrel (PLAVIX) 75 MG tablet TAKE 1 TABLET DAILY 90 tablet 3   Cyanocobalamin (VITAMIN B 12 PO) Take 1,000 mcg by mouth daily. 1018m daily     FARXIGA 10 MG TABS tablet TAKE 1 TABLET BY MOUTH DAILY  BEFORE BREAKFAST 90 tablet 3   fexofenadine (ALLEGRA) 180 MG tablet Take 180 mg by mouth daily.     lisinopril (ZESTRIL) 20 MG tablet TAKE 1 TABLET BY MOUTH DAILY  (REPLACES LISINOPRIL HCT) 90 tablet 0   meclizine (ANTIVERT) 12.5 MG tablet Take 1 tablet (12.5 mg total) by mouth 2 (two) times daily as needed for dizziness. 60 tablet 0   metoprolol tartrate (LOPRESSOR) 50 MG tablet Take 50 mg by mouth 2 (two) times daily.     Multiple Vitamin  tablet Take 1 tablet by mouth daily.      ondansetron (ZOFRAN-ODT) 4 MG disintegrating tablet Take 1 tablet (4 mg total) by mouth every 8 (eight) hours as needed for nausea or vomiting. 12 tablet 0   sertraline (ZOLOFT) 50 MG tablet TAKE 1 TABLET BY MOUTH DAILY 90 tablet 0   No current facility-administered medications for this visit.    Review of Systems  Constitutional:  Positive for malaise/fatigue. Negative for chills, diaphoresis, fever and weight loss.       Feels "better" overall.  HENT:  Negative for congestion, ear discharge, ear pain, hearing loss, nosebleeds, sinus pain, sore throat and tinnitus.   Eyes:  Negative for blurred vision, double vision and photophobia.  Respiratory:  Negative for cough, hemoptysis, sputum production and shortness of breath.   Cardiovascular:  Negative for chest pain, palpitations, orthopnea and leg swelling.         On Plavix for peripheral vascular disease  Gastrointestinal:  Negative for abdominal pain, blood in stool, constipation, diarrhea (on Imodium), heartburn, melena, nausea and vomiting.       IBS.  No prior EGD.  Genitourinary:  Negative  for dysuria, frequency, hematuria and urgency.  Musculoskeletal:  Positive for myalgias (leg cramps). Negative for back pain, joint pain and neck pain.       Chronic leg pain.  Skin:  Negative for itching and rash.  Neurological: Negative.  Negative for dizziness, tingling, tremors, sensory change, speech change, focal weakness, weakness and headaches.  Endo/Heme/Allergies:  Negative for environmental allergies (on Allegra). Does not bruise/bleed easily.  Psychiatric/Behavioral:  Positive for depression. Negative for hallucinations and memory loss. The patient is not nervous/anxious and does not have insomnia.    Performance status (ECOG):  1  Vital Signs: Blood pressure (!) 152/70, pulse (!) 59, temperature (!) 96.4 F (35.8 C), temperature source Tympanic, weight 189 lb 12.8 oz (86.1 kg), SpO2 97 %.   Physical Exam Vitals and nursing note reviewed.  Constitutional:      General: She is not in acute distress.    Appearance: She is well-developed. She is not diaphoretic.  HENT:     Head: Normocephalic and atraumatic.  Eyes:     General: No scleral icterus.    Pupils: Pupils are equal, round, and reactive to light.  Cardiovascular:     Rate and Rhythm: Normal rate.     Heart sounds: No murmur heard. Pulmonary:     Effort: Pulmonary effort is normal. No respiratory distress.  Abdominal:     General: Bowel sounds are normal. There is no distension.     Palpations: Abdomen is soft. There is no hepatomegaly, splenomegaly or mass.     Tenderness: There is no rebound.  Musculoskeletal:        General: No swelling or tenderness. Normal range of motion.     Cervical back: Normal range of motion and neck supple.  Lymphadenopathy:     Head:     Right side of head: No preauricular, posterior auricular or occipital adenopathy.     Left side of head: No preauricular, posterior auricular or occipital adenopathy.     Cervical: No cervical adenopathy.     Upper Body:     Right upper body:  No supraclavicular or axillary adenopathy.     Left upper body: No supraclavicular or axillary adenopathy.     Lower Body: No right inguinal adenopathy. No left inguinal adenopathy.  Skin:    General: Skin is warm and dry.  Neurological:     Mental Status: She is alert and oriented to person, place, and time.  Psychiatric:        Mood and Affect: Mood normal.    Labs.    Latest Ref Rng & Units 11/27/2021   10:34 AM 05/29/2021   10:05 AM 05/25/2021   12:50 PM  CBC  WBC 4.0 - 10.5 K/uL 6.1  6.9  6.6   Hemoglobin 12.0 - 15.0 g/dL 12.5  11.8  11.8   Hematocrit 36.0 - 46.0 % 37.6  36.1  35.5   Platelets 150 - 400 K/uL 261  348  315       Latest Ref Rng & Units 11/27/2021   10:34 AM 05/29/2021   10:05 AM 05/25/2021   12:50 PM  CMP  Glucose 70 - 99 mg/dL 195  168  181   BUN 8 - 23 mg/dL 37  33  41   Creatinine 0.44 - 1.00 mg/dL 1.77  1.90  1.70   Sodium 135 - 145 mmol/L 135  133  137   Potassium 3.5 - 5.1 mmol/L 4.2  4.8  4.6   Chloride 98 - 111 mmol/L 104  102  105   CO2 22 - 32 mmol/L _0 Calcium 8.9 - 10.3 mg/dL 9.0  9.2  9.2   Total Protein 6.5 - 8.1 g/dL 7.2  7.4  7.3   Total Bilirubin 0.3 - 1.2 mg/dL 1.1  0.8  0.9   Alkaline Phos 38 - 126 U/L 74  99  129   AST 15 - 41 U/L _1 ALT 0 - 44 U/L 17  19  35    Lab Results  Component Value Date   IRON 90 11/27/2021   TIBC 336 11/27/2021   FERRITIN 66 11/27/2021

## 2021-11-30 LAB — HM DIABETES EYE EXAM

## 2021-12-07 ENCOUNTER — Encounter: Payer: Self-pay | Admitting: Internal Medicine

## 2021-12-07 ENCOUNTER — Ambulatory Visit (INDEPENDENT_AMBULATORY_CARE_PROVIDER_SITE_OTHER): Payer: Medicare Other | Admitting: Internal Medicine

## 2021-12-07 VITALS — BP 112/78 | HR 68 | Ht 62.0 in | Wt 189.0 lb

## 2021-12-07 DIAGNOSIS — Z0000 Encounter for general adult medical examination without abnormal findings: Secondary | ICD-10-CM

## 2021-12-07 DIAGNOSIS — F17201 Nicotine dependence, unspecified, in remission: Secondary | ICD-10-CM | POA: Diagnosis not present

## 2021-12-07 DIAGNOSIS — N1832 Chronic kidney disease, stage 3b: Secondary | ICD-10-CM

## 2021-12-07 DIAGNOSIS — E118 Type 2 diabetes mellitus with unspecified complications: Secondary | ICD-10-CM | POA: Diagnosis not present

## 2021-12-07 DIAGNOSIS — F324 Major depressive disorder, single episode, in partial remission: Secondary | ICD-10-CM

## 2021-12-07 DIAGNOSIS — I10 Essential (primary) hypertension: Secondary | ICD-10-CM

## 2021-12-07 DIAGNOSIS — E1169 Type 2 diabetes mellitus with other specified complication: Secondary | ICD-10-CM

## 2021-12-07 DIAGNOSIS — Z23 Encounter for immunization: Secondary | ICD-10-CM | POA: Diagnosis not present

## 2021-12-07 DIAGNOSIS — D631 Anemia in chronic kidney disease: Secondary | ICD-10-CM

## 2021-12-07 DIAGNOSIS — Z1231 Encounter for screening mammogram for malignant neoplasm of breast: Secondary | ICD-10-CM | POA: Diagnosis not present

## 2021-12-07 DIAGNOSIS — G250 Essential tremor: Secondary | ICD-10-CM | POA: Insufficient documentation

## 2021-12-07 DIAGNOSIS — E785 Hyperlipidemia, unspecified: Secondary | ICD-10-CM

## 2021-12-07 NOTE — Progress Notes (Signed)
Date:  12/07/2021   Name:  Misty Villarreal   DOB:  1951-01-16   MRN:  [id number]   Chief Complaint: Annual Exam Beth Goodlin Bischoff is a 71 y.o. female who presents today for her Complete Annual Exam. She feels well. She reports exercising / walking. She reports she is sleeping well. Breast complaints - none.  Mammogram: 01/2021 DEXA: 01/2018 Pap smear: discontinued Colonoscopy: 04/2017 repeat 10 yrs  Health Maintenance Due  Topic Date Due   Lung Cancer Screening  11/15/2020   INFLUENZA VACCINE  08/21/2021   OPHTHALMOLOGY EXAM  11/24/2021    Immunization History  Administered Date(s) Administered   Fluad Quad(high Dose 65+) 10/23/2020   Influenza, High Dose Seasonal PF 11/13/2016, 11/12/2018   Influenza-Unspecified 11/21/2016, 12/18/2017, 11/22/2019   PFIZER(Purple Top)SARS-COV-2 Vaccination 03/24/2019, 04/14/2019   Pneumococcal Conjugate-13 08/04/2015   Pneumococcal Polysaccharide-23 12/15/2012, 09/09/2018   Zoster Recombinat (Shingrix) 12/04/2017, 11/12/2018    Hypertension This is a chronic problem. The problem is controlled. Pertinent negatives include no chest pain, headaches, palpitations or shortness of breath. Past treatments include ACE inhibitors. The current treatment provides significant improvement. There are no compliance problems.  Hypertensive end-organ damage includes kidney disease. There is no history of CAD/MI or CVA.  Diabetes She presents for her follow-up diabetic visit. She has type 2 diabetes mellitus. Her disease course has been stable. Hypoglycemia symptoms include dizziness and tremors. Pertinent negatives for hypoglycemia include no headaches, nervousness/anxiousness or speech difficulty. Pertinent negatives for diabetes include no chest pain, no fatigue, no polydipsia, no polyuria and no weakness. Pertinent negatives for diabetic complications include no CVA. Current diabetic treatments: farxiga. An ACE inhibitor/angiotensin II receptor blocker is  being taken. Eye exam is current.  Hyperlipidemia This is a chronic problem. The problem is controlled. Pertinent negatives include no chest pain or shortness of breath. Current antihyperlipidemic treatment includes statins. The current treatment provides moderate improvement of lipids.  Tremor - onset about 6 months ago in hands.  Interferes to a moderate degree with writing, eating and drinking.  No tremor of head, no tremor at rest.  No family hx of tremor.    Lab Results  Component Value Date   NA 135 11/27/2021   K 4.2 11/27/2021   CO2 21 (L) 11/27/2021   GLUCOSE 195 (H) 11/27/2021   BUN 37 (H) 11/27/2021   CREATININE 1.77 (H) 11/27/2021   CALCIUM 9.0 11/27/2021   EGFR 40 (L) 04/02/2021   GFRNONAA 30 (L) 11/27/2021   Lab Results  Component Value Date   CHOL 176 12/01/2020   HDL 52 12/01/2020   LDLCALC 79 12/01/2020   LDLDIRECT 76 07/14/2020   TRIG 275 (H) 12/01/2020   CHOLHDL 3.4 12/01/2020   Lab Results  Component Value Date   TSH 3.640 12/01/2020   Lab Results  Component Value Date   HGBA1C 6.6 (A) 08/06/2021   Lab Results  Component Value Date   WBC 6.1 11/27/2021   HGB 12.5 11/27/2021   HCT 37.6 11/27/2021   MCV 88.5 11/27/2021   PLT 261 11/27/2021   Lab Results  Component Value Date   ALT 17 11/27/2021   AST 20 11/27/2021   ALKPHOS 74 11/27/2021   BILITOT 1.1 11/27/2021   Lab Results  Component Value Date   VD25OH 21.79 (L) 09/13/2019     Review of Systems  Constitutional:  Negative for chills, fatigue and fever.  HENT:  Negative for congestion, hearing loss, tinnitus, trouble swallowing and voice change.   Eyes:  Negative for visual disturbance.  Respiratory:  Negative for cough, chest tightness, shortness of breath and wheezing.   Cardiovascular:  Negative for chest pain, palpitations and leg swelling.  Gastrointestinal:  Negative for abdominal pain, constipation, diarrhea and vomiting.  Endocrine: Negative for polydipsia and polyuria.   Genitourinary:  Negative for dysuria, frequency, genital sores, vaginal bleeding and vaginal discharge.  Musculoskeletal:  Negative for arthralgias, gait problem and joint swelling.  Skin:  Negative for color change and rash.  Neurological:  Positive for dizziness and tremors. Negative for speech difficulty, weakness, light-headedness, numbness and headaches.  Hematological:  Negative for adenopathy. Does not bruise/bleed easily.  Psychiatric/Behavioral:  Negative for dysphoric mood and sleep disturbance. The patient is not nervous/anxious.     Patient Active Problem List   Diagnosis Date Noted   CAD (coronary artery disease) 05/14/2021   Chronic diarrhea 10/27/2020   Atherosclerosis of native arteries of extremity with intermittent claudication ([place]) 05/15/2020   Palpitations 03/29/2020   Stable angina pectoris 03/29/2020   Aortic atherosclerosis ([place]) 11/19/2019   Other fatigue 09/07/2019   Stage 3b chronic kidney disease ([place]) 12/23/2018   Restless leg syndrome 12/23/2018   Unruptured synovial cyst of popliteal space 12/23/2018   Plantar fasciitis of left foot 12/23/2018   B12 deficiency 11/26/2018   Hypogammaglobulinemia ([place]) 11/11/2018   Anemia due to stage 3b chronic kidney disease ([place]) 11/11/2018   Osteopenia determined by x-ray 02/16/2018   Major depressive disorder with single episode, in partial remission ([place]) 08/04/2017   Neck pain on right side 08/05/2016   Tinnitus of right ear 08/05/2016   Type II diabetes mellitus with complication ([place]) 19/37/9024   Lymphedema 06/21/2015   History of paroxysmal supraventricular tachycardia 06/21/2015   PAD (peripheral artery disease) ([place]) 02/02/2015   Hyperlipidemia associated with type 2 diabetes mellitus ([place]) 09/01/2014   Neuropathy 09/01/2014   Phlebectasia 09/01/2014   Essential (primary) hypertension 09/01/2014   Spondylolisthesis at L4-L5 level 07/05/2014   Arthritis, degenerative 01/31/2014    Allergies  Allergen  Reactions   Augmentin [Amoxicillin-Pot Clavulanate] Diarrhea   Levaquin [Levofloxacin] Nausea And Vomiting and Other (See Comments)    Ankle pain   Tetracycline     Other reaction(s): emesis   Cefaclor Rash   Cephalexin Rash   Rosuvastatin Diarrhea   Sulfa Antibiotics Rash    Other reaction(s): emesis    Past Surgical History:  Procedure Laterality Date   ANGIOPLASTY / STENTING FEMORAL Left 2014, 2013   BRAIN MENINGIOMA EXCISION  1988   BREAST BIOPSY Right 1989   benign   CARPAL TUNNEL RELEASE Left 1980   CATARACT EXTRACTION W/PHACO Right 09/02/2019   Procedure: CATARACT EXTRACTION PHACO AND INTRAOCULAR LENS PLACEMENT ([place]);  Surgeon: Eulogio Bear, MD;  Location: [facility];  Service: Ophthalmology;  Laterality: Right;  [place] 00:27.3 CDE 1.92 AP% Fluid Pack lot # [id number]   CATARACT EXTRACTION W/PHACO Left 10/18/2019   Procedure: CATARACT EXTRACTION PHACO AND INTRAOCULAR LENS PLACEMENT ([facility]) LEFT DIABETIC 2.55  00:28.5;  Surgeon: Eulogio Bear, MD;  Location: [place];  Service: Ophthalmology;  Laterality: Left;  Diabetic - oral meds   CESAREAN SECTION     CHOLECYSTECTOMY  1987   COLONOSCOPY WITH PROPOFOL N/A 05/08/2017   Procedure: COLONOSCOPY WITH PROPOFOL;  Surgeon: Jonathon Bellows, MD;  Location: [facility];  Service: Gastroenterology;  Laterality: N/A;   KNEE ARTHROPLASTY Right 2009   LOWER EXTREMITY ANGIOGRAPHY Left 07/21/2018   Procedure: LOWER EXTREMITY ANGIOGRAPHY;  Surgeon: Katha Cabal, MD;  Location:  [facility];  Service: Cardiovascular;  Laterality: Left;    Social History   Tobacco Use   Smoking status: Former    Packs/day: 0.50    Years: 46.00    Total pack years: 23.00    Types: Cigarettes    Quit date: 04/05/2013    Years since quitting: 8.6   Smokeless tobacco: Never  Vaping Use   Vaping Use: Never used  Substance Use Topics   Alcohol use: Yes    Alcohol/week: 2.0 standard drinks of alcohol    Types: 2 Standard drinks or  equivalent per week   Drug use: Never     Medication list has been reviewed and updated.  Current Meds  Medication Sig   albuterol (VENTOLIN HFA) 108 (90 Base) MCG/ACT inhaler Inhale 2 puffs into the lungs every 6 (six) hours as needed.   aspirin 81 MG chewable tablet Chew 81 mg by mouth daily.    atorvastatin (LIPITOR) 40 MG tablet TAKE 1 TABLET DAILY   blood glucose meter kit and supplies KIT Dispense based on patient and insurance preference. Use to check BS bid   Cholecalciferol (VITAMIN D) 50 MCG (2000 UT) CAPS Take 2 capsules by mouth daily.   clopidogrel (PLAVIX) 75 MG tablet TAKE 1 TABLET DAILY   Cyanocobalamin (VITAMIN B 12 PO) Take 1,000 mcg by mouth daily. 1034m daily   FARXIGA 10 MG TABS tablet TAKE 1 TABLET BY MOUTH DAILY  BEFORE BREAKFAST   fexofenadine (ALLEGRA) 180 MG tablet Take 180 mg by mouth daily.   lisinopril (ZESTRIL) 20 MG tablet TAKE 1 TABLET BY MOUTH DAILY  (REPLACES LISINOPRIL HCT)   meclizine (ANTIVERT) 12.5 MG tablet Take 1 tablet (12.5 mg total) by mouth 2 (two) times daily as needed for dizziness.   metoprolol tartrate (LOPRESSOR) 50 MG tablet Take 50 mg by mouth 2 (two) times daily.   Multiple Vitamin tablet Take 1 tablet by mouth daily.    ondansetron (ZOFRAN-ODT) 4 MG disintegrating tablet Take 1 tablet (4 mg total) by mouth every 8 (eight) hours as needed for nausea or vomiting.   sertraline (ZOLOFT) 50 MG tablet TAKE 1 TABLET BY MOUTH DAILY       12/07/2021   10:01 AM 08/06/2021    2:57 PM 04/20/2021   10:54 AM 04/02/2021   10:59 AM  GAD 7 : Generalized Anxiety Score  Nervous, Anxious, on Edge 0 0 0 0  Control/stop worrying 0 0 0 0  Worry too much - different things 0 0 0 0  Trouble relaxing 0 0 0 0  Restless 0 0 0 0  Easily annoyed or irritable 0 0 0 0  Afraid - awful might happen 0 0 0 0  Total GAD 7 Score 0 0 0 0  Anxiety Difficulty Not difficult at all Not difficult at all  Not difficult at all       12/07/2021   10:01 AM 11/14/2021     2:07 PM 08/06/2021    2:57 PM  Depression screen PHQ 2/9  Decreased Interest 0 0 0  Down, Depressed, Hopeless 0 0 0  PHQ - 2 Score 0 0 0  Altered sleeping 0 0 0  Tired, decreased energy 0 0 0  Change in appetite 0 0 0  Feeling bad or failure about yourself  0 0 0  Trouble concentrating 0 0 0  Moving slowly or fidgety/restless 0 0 0  Suicidal thoughts 0 0 0  PHQ-9 Score 0 0 0  Difficult doing  work/chores Not difficult at all Not difficult at all Not difficult at all    BP Readings from Last 3 Encounters:  12/07/21 112/78  11/29/21 (!) 152/70  08/06/21 120/80    Physical Exam Vitals and nursing note reviewed.  Constitutional:      General: She is not in acute distress.    Appearance: She is well-developed.  HENT:     Head: Normocephalic and atraumatic.     Right Ear: Tympanic membrane and ear canal normal.     Left Ear: Tympanic membrane and ear canal normal.     Nose:     Right Sinus: No maxillary sinus tenderness.     Left Sinus: No maxillary sinus tenderness.  Eyes:     General: No scleral icterus.       Right eye: No discharge.        Left eye: No discharge.     Conjunctiva/sclera: Conjunctivae normal.  Neck:     Thyroid: No thyromegaly.     Vascular: No carotid bruit.  Cardiovascular:     Rate and Rhythm: Normal rate and regular rhythm.     Pulses: Normal pulses.     Heart sounds: Normal heart sounds.  Pulmonary:     Effort: Pulmonary effort is normal. No respiratory distress.     Breath sounds: No wheezing.  Chest:  Breasts:    Right: No mass, nipple discharge, skin change or tenderness.     Left: No mass, nipple discharge, skin change or tenderness.  Abdominal:     General: Bowel sounds are normal.     Palpations: Abdomen is soft.     Tenderness: There is no abdominal tenderness.  Musculoskeletal:     Cervical back: Normal range of motion. No erythema.     Right lower leg: No edema.     Left lower leg: No edema.  Lymphadenopathy:     Cervical: No  cervical adenopathy.  Skin:    General: Skin is warm and dry.     Capillary Refill: Capillary refill takes less than 2 seconds.     Findings: No rash.  Neurological:     General: No focal deficit present.     Mental Status: She is alert and oriented to person, place, and time.     Cranial Nerves: No cranial nerve deficit.     Sensory: No sensory deficit.     Motor: Tremor present. No weakness, atrophy, abnormal muscle tone or pronator drift.     Coordination: Coordination is intact.     Gait: Gait is intact.     Deep Tendon Reflexes: Reflexes are normal and symmetric.     Comments: Fine tremor with intention of both UEs,  L>R  Psychiatric:        Attention and Perception: Attention normal.        Mood and Affect: Mood normal.     Wt Readings from Last 3 Encounters:  12/07/21 189 lb (85.7 kg)  11/29/21 189 lb 12.8 oz (86.1 kg)  11/14/21 184 lb (83.5 kg)    BP 112/78 (BP Location: Left Arm, Patient Position: Sitting, Cuff Size: Normal)   Pulse 68   Ht _0  (1.575 m)   Wt 189 lb (85.7 kg)   LMP  (LMP Unknown)   SpO2 95%   BMI 34.57 kg/m   Assessment and Plan: 1. Annual physical exam Exam is normal except for weight. Encourage regular exercise and appropriate dietary changes. Up to date on screenings and immunizations.  2. Encounter  for screening mammogram for breast cancer Schedule in January - MM 3D SCREEN BREAST BILATERAL  3. Essential (primary) hypertension Clinically stable exam with well controlled BP. Tolerating medications without side effects at this time. Pt to continue current regimen and low sodium diet; benefits of regular exercise as able discussed. - TSH  4. Type II diabetes mellitus with complication (HCC) Clinically stable by exam and report without s/s of hypoglycemia. DM complicated by hypertension and dyslipidemia. Tolerating medications well without side effects or other concerns. - Hemoglobin A1c  5. Hyperlipidemia associated with type 2  diabetes mellitus ([place]) Tolerating statin medication without side effects at this time LDL is at goal of < 70 on current dose Continue same therapy without change at this time. - Lipid panel  6. Anemia due to stage 3b chronic kidney disease ([place]) Followed by Hematology Recent labs stable and did not require IV iron.  7. Stage 3b chronic kidney disease ([place]) Stable, followed by Nephrology.  8. Major depressive disorder with single episode, in partial remission ([place]) Clinically stable on current regimen with good control of symptoms, No SI or HI. Will continue current therapy.  9. Benign essential tremor Recent onset - no features to suggest parkinsonism Could try gabapentin or other medications but she declines at this time Will discuss and re-evaluate at next visit - she can return sooner if needed  10. Need for immunization against influenza - Flu Vaccine QUAD High Dose(Fluad)  11. Tobacco use disorder, moderate, in sustained remission Has been referred to [facility] but has not been called. Will look into the status.   Partially dictated using Editor, commissioning. Any errors are unintentional.  Halina Maidens, MD [facility]  12/07/2021

## 2021-12-08 LAB — HEMOGLOBIN A1C
Est. average glucose Bld gHb Est-mCnc: 183 mg/dL
Hgb A1c MFr Bld: 8 % — ABNORMAL HIGH (ref 4.8–5.6)

## 2021-12-08 LAB — TSH: TSH: 3.2 u[IU]/mL (ref 0.450–4.500)

## 2021-12-08 LAB — LIPID PANEL
Chol/HDL Ratio: 4.5 ratio — ABNORMAL HIGH (ref 0.0–4.4)
Cholesterol, Total: 211 mg/dL — ABNORMAL HIGH (ref 100–199)
HDL: 47 mg/dL (ref >39)
LDL Chol Calc (NIH): 117 mg/dL — ABNORMAL HIGH (ref 0–99)
Triglycerides: 268 mg/dL — ABNORMAL HIGH (ref 0–149)
VLDL Cholesterol Cal: 47 mg/dL — ABNORMAL HIGH (ref 5–40)

## 2021-12-10 ENCOUNTER — Other Ambulatory Visit: Payer: Self-pay

## 2021-12-10 DIAGNOSIS — Z87891 Personal history of nicotine dependence: Secondary | ICD-10-CM

## 2021-12-10 DIAGNOSIS — Z122 Encounter for screening for malignant neoplasm of respiratory organs: Secondary | ICD-10-CM

## 2021-12-22 ENCOUNTER — Other Ambulatory Visit: Payer: Self-pay | Admitting: Internal Medicine

## 2021-12-22 DIAGNOSIS — I10 Essential (primary) hypertension: Secondary | ICD-10-CM

## 2021-12-24 NOTE — Telephone Encounter (Signed)
Requested Prescriptions  Pending Prescriptions Disp Refills   lisinopril (ZESTRIL) 20 MG tablet [Pharmacy Med Name: Lisinopril 20 MG Oral Tablet] 90 tablet 3    Sig: TAKE 1 TABLET BY MOUTH DAILY     Cardiovascular:  ACE Inhibitors Failed - 12/22/2021 11:23 PM      Failed - Cr in normal range and within 180 days    Creatinine  Date Value Ref Range Status  04/27/2013 1.28 0.60 - 1.30 mg/dL Final   Creatinine, Ser  Date Value Ref Range Status  11/27/2021 1.77 (H) 0.44 - 1.00 mg/dL Final   Creatinine, Urine  Date Value Ref Range Status  04/09/2021 243  Final         Passed - K in normal range and within 180 days    Potassium  Date Value Ref Range Status  11/27/2021 4.2 3.5 - 5.1 mmol/L Final  04/27/2013 4.2 3.5 - 5.1 mmol/L Final         Passed - Patient is not pregnant      Passed - Last BP in normal range    BP Readings from Last 1 Encounters:  12/07/21 112/78         Passed - Valid encounter within last 6 months    Recent Outpatient Visits           2 weeks ago Annual physical exam   [place] [facility], Jesse Sans, MD   4 months ago Essential (primary) hypertension   [place] [facility] at [facility], Jesse Sans, MD   8 months ago COVID-19 virus infection   [place] [facility] at [facility], Jesse Sans, MD   8 months ago Type II diabetes mellitus with complication [facility])   [place] [facility] at [facility], Jesse Sans, MD   1 year ago Annual physical exam   [facility] at [facility], Jesse Sans, MD       Future Appointments             In 3 months Army Melia, Jesse Sans, MD [place] [facility] at [facility], [facility]

## 2021-12-25 ENCOUNTER — Other Ambulatory Visit: Payer: Self-pay | Admitting: Internal Medicine

## 2021-12-26 ENCOUNTER — Ambulatory Visit: Admission: RE | Admit: 2021-12-26 | Payer: Medicare Other | Source: Ambulatory Visit

## 2022-01-13 ENCOUNTER — Other Ambulatory Visit: Payer: Self-pay | Admitting: Internal Medicine

## 2022-01-13 DIAGNOSIS — E782 Mixed hyperlipidemia: Secondary | ICD-10-CM

## 2022-01-27 ENCOUNTER — Other Ambulatory Visit: Payer: Self-pay | Admitting: Internal Medicine

## 2022-01-27 DIAGNOSIS — F32A Depression, unspecified: Secondary | ICD-10-CM

## 2022-01-28 NOTE — Telephone Encounter (Signed)
Requested Prescriptions  Pending Prescriptions Disp Refills   sertraline (ZOLOFT) 50 MG tablet [Pharmacy Med Name: Sertraline HCl 50 MG Oral Tablet] 90 tablet 1    Sig: TAKE 1 TABLET BY MOUTH DAILY     Psychiatry:  Antidepressants - SSRI - sertraline Passed - 01/27/2022 10:30 PM      Passed - AST in normal range and within 360 days    AST  Date Value Ref Range Status  11/27/2021 20 15 - 41 U/L Final         Passed - ALT in normal range and within 360 days    ALT  Date Value Ref Range Status  11/27/2021 17 0 - 44 U/L Final         Passed - Completed PHQ-2 or PHQ-9 in the last 360 days      Passed - Valid encounter within last 6 months    Recent Outpatient Visits           1 month ago Annual physical exam   [place] [facility] at [facility], Jesse Sans, MD   5 months ago Essential (primary) hypertension   [place] [facility] at [facility], Jesse Sans, MD   9 months ago COVID-19 virus infection   [place] [facility] at [facility], Jesse Sans, MD   10 months ago Type II diabetes mellitus with complication [facility])   [place] [facility] at [facility], Jesse Sans, MD   1 year ago Annual physical exam   [facility] at [facility], Jesse Sans, MD       Future Appointments             In 1 month Army Melia, Jesse Sans, MD [place] [facility] at [facility], [facility]

## 2022-02-21 ENCOUNTER — Ambulatory Visit
Admission: RE | Admit: 2022-02-21 | Discharge: 2022-02-21 | Disposition: A | Discharge status: Home / Self Care | Payer: Medicare Other | Source: Ambulatory Visit | Attending: Internal Medicine | Admitting: Internal Medicine

## 2022-02-21 DIAGNOSIS — Z1231 Encounter for screening mammogram for malignant neoplasm of breast: Secondary | ICD-10-CM | POA: Insufficient documentation

## 2022-02-25 ENCOUNTER — Other Ambulatory Visit: Payer: Self-pay | Admitting: Internal Medicine

## 2022-03-14 ENCOUNTER — Other Ambulatory Visit: Payer: Self-pay

## 2022-03-14 ENCOUNTER — Emergency Department: Payer: Medicare Other

## 2022-03-14 ENCOUNTER — Inpatient Hospital Stay
Admission: EM | Admit: 2022-03-14 | Discharge: 2022-03-16 | DRG: 392 | Disposition: A | Discharge status: Home / Self Care | Payer: Medicare Other | Attending: Osteopathic Medicine | Admitting: Osteopathic Medicine

## 2022-03-14 DIAGNOSIS — Z87891 Personal history of nicotine dependence: Secondary | ICD-10-CM

## 2022-03-14 DIAGNOSIS — K580 Irritable bowel syndrome with diarrhea: Secondary | ICD-10-CM | POA: Diagnosis not present

## 2022-03-14 DIAGNOSIS — Z8249 Family history of ischemic heart disease and other diseases of the circulatory system: Secondary | ICD-10-CM

## 2022-03-14 DIAGNOSIS — Z881 Allergy status to other antibiotic agents status: Secondary | ICD-10-CM

## 2022-03-14 DIAGNOSIS — R1032 Left lower quadrant pain: Secondary | ICD-10-CM | POA: Diagnosis not present

## 2022-03-14 DIAGNOSIS — D6489 Other specified anemias: Secondary | ICD-10-CM | POA: Diagnosis present

## 2022-03-14 DIAGNOSIS — Z833 Family history of diabetes mellitus: Secondary | ICD-10-CM

## 2022-03-14 DIAGNOSIS — Z6833 Body mass index (BMI) 33.0-33.9, adult: Secondary | ICD-10-CM

## 2022-03-14 DIAGNOSIS — E1122 Type 2 diabetes mellitus with diabetic chronic kidney disease: Secondary | ICD-10-CM | POA: Diagnosis present

## 2022-03-14 DIAGNOSIS — Z79899 Other long term (current) drug therapy: Secondary | ICD-10-CM

## 2022-03-14 DIAGNOSIS — R112 Nausea with vomiting, unspecified: Secondary | ICD-10-CM | POA: Diagnosis not present

## 2022-03-14 DIAGNOSIS — E785 Hyperlipidemia, unspecified: Secondary | ICD-10-CM | POA: Diagnosis present

## 2022-03-14 DIAGNOSIS — I5032 Chronic diastolic (congestive) heart failure: Secondary | ICD-10-CM | POA: Diagnosis present

## 2022-03-14 DIAGNOSIS — Z7982 Long term (current) use of aspirin: Secondary | ICD-10-CM

## 2022-03-14 DIAGNOSIS — Z8719 Personal history of other diseases of the digestive system: Secondary | ICD-10-CM | POA: Diagnosis not present

## 2022-03-14 DIAGNOSIS — N179 Acute kidney failure, unspecified: Secondary | ICD-10-CM | POA: Diagnosis present

## 2022-03-14 DIAGNOSIS — Z95820 Peripheral vascular angioplasty status with implants and grafts: Secondary | ICD-10-CM | POA: Diagnosis not present

## 2022-03-14 DIAGNOSIS — R109 Unspecified abdominal pain: Secondary | ICD-10-CM | POA: Diagnosis present

## 2022-03-14 DIAGNOSIS — K529 Noninfective gastroenteritis and colitis, unspecified: Principal | ICD-10-CM

## 2022-03-14 DIAGNOSIS — E860 Dehydration: Secondary | ICD-10-CM | POA: Diagnosis present

## 2022-03-14 DIAGNOSIS — T503X5A Adverse effect of electrolytic, caloric and water-balance agents, initial encounter: Secondary | ICD-10-CM | POA: Diagnosis present

## 2022-03-14 DIAGNOSIS — N1832 Chronic kidney disease, stage 3b: Secondary | ICD-10-CM | POA: Diagnosis present

## 2022-03-14 DIAGNOSIS — Z86011 Personal history of benign neoplasm of the brain: Secondary | ICD-10-CM

## 2022-03-14 DIAGNOSIS — Z86718 Personal history of other venous thrombosis and embolism: Secondary | ICD-10-CM | POA: Diagnosis not present

## 2022-03-14 DIAGNOSIS — K56600 Partial intestinal obstruction, unspecified as to cause: Secondary | ICD-10-CM | POA: Diagnosis present

## 2022-03-14 DIAGNOSIS — I2510 Atherosclerotic heart disease of native coronary artery without angina pectoris: Secondary | ICD-10-CM | POA: Diagnosis present

## 2022-03-14 DIAGNOSIS — E669 Obesity, unspecified: Secondary | ICD-10-CM | POA: Diagnosis present

## 2022-03-14 DIAGNOSIS — Z888 Allergy status to other drugs, medicaments and biological substances status: Secondary | ICD-10-CM

## 2022-03-14 DIAGNOSIS — Z882 Allergy status to sulfonamides status: Secondary | ICD-10-CM

## 2022-03-14 DIAGNOSIS — I70202 Unspecified atherosclerosis of native arteries of extremities, left leg: Secondary | ICD-10-CM | POA: Diagnosis present

## 2022-03-14 DIAGNOSIS — I130 Hypertensive heart and chronic kidney disease with heart failure and stage 1 through stage 4 chronic kidney disease, or unspecified chronic kidney disease: Secondary | ICD-10-CM | POA: Diagnosis present

## 2022-03-14 DIAGNOSIS — F32A Depression, unspecified: Secondary | ICD-10-CM | POA: Diagnosis present

## 2022-03-14 DIAGNOSIS — I25119 Atherosclerotic heart disease of native coronary artery with unspecified angina pectoris: Secondary | ICD-10-CM | POA: Diagnosis not present

## 2022-03-14 DIAGNOSIS — R197 Diarrhea, unspecified: Secondary | ICD-10-CM | POA: Diagnosis not present

## 2022-03-14 DIAGNOSIS — I739 Peripheral vascular disease, unspecified: Secondary | ICD-10-CM | POA: Diagnosis present

## 2022-03-14 DIAGNOSIS — Z880 Allergy status to penicillin: Secondary | ICD-10-CM

## 2022-03-14 DIAGNOSIS — E1129 Type 2 diabetes mellitus with other diabetic kidney complication: Secondary | ICD-10-CM | POA: Diagnosis present

## 2022-03-14 DIAGNOSIS — Z7902 Long term (current) use of antithrombotics/antiplatelets: Secondary | ICD-10-CM

## 2022-03-14 DIAGNOSIS — I10 Essential (primary) hypertension: Secondary | ICD-10-CM | POA: Diagnosis not present

## 2022-03-14 DIAGNOSIS — E1151 Type 2 diabetes mellitus with diabetic peripheral angiopathy without gangrene: Secondary | ICD-10-CM | POA: Diagnosis present

## 2022-03-14 DIAGNOSIS — E782 Mixed hyperlipidemia: Secondary | ICD-10-CM | POA: Diagnosis not present

## 2022-03-14 DIAGNOSIS — Z9049 Acquired absence of other specified parts of digestive tract: Secondary | ICD-10-CM

## 2022-03-14 LAB — URINALYSIS, ROUTINE W REFLEX MICROSCOPIC
Bacteria, UA: NONE SEEN
Bilirubin Urine: NEGATIVE
Glucose, UA: >=500 mg/dL — AB
Hgb urine dipstick: NEGATIVE
Ketones, ur: NEGATIVE mg/dL
Leukocytes,Ua: NEGATIVE
Nitrite: NEGATIVE
Protein, ur: 100 mg/dL — AB
Specific Gravity, Urine: 1.022 (ref 1.005–1.030)
pH: 5 (ref 5.0–8.0)

## 2022-03-14 LAB — APTT: aPTT: 28 seconds (ref 24–36)

## 2022-03-14 LAB — GASTROINTESTINAL PANEL BY PCR, STOOL (REPLACES STOOL CULTURE)

## 2022-03-14 LAB — CBC
HCT: 42.4 % (ref 36.0–46.0)
Hemoglobin: 14.1 g/dL (ref 12.0–15.0)
MCH: 29.7 pg (ref 26.0–34.0)
MCHC: 33.3 g/dL (ref 30.0–36.0)
MCV: 89.5 fL (ref 80.0–100.0)
Platelets: 324 10*3/uL (ref 150–400)
RBC: 4.74 MIL/uL (ref 3.87–5.11)
RDW: 12.3 % (ref 11.5–15.5)
WBC: 12.4 10*3/uL — ABNORMAL HIGH (ref 4.0–10.5)
nRBC: 0 % (ref 0.0–0.2)

## 2022-03-14 LAB — COMPREHENSIVE METABOLIC PANEL
ALT: 19 U/L (ref 0–44)
AST: 24 U/L (ref 15–41)
Albumin: 4.2 g/dL (ref 3.5–5.0)
Alkaline Phosphatase: 84 U/L (ref 38–126)
Anion gap: 11 (ref 5–15)
BUN: 37 mg/dL — ABNORMAL HIGH (ref 8–23)
CO2: 19 mmol/L — ABNORMAL LOW (ref 22–32)
Calcium: 8.7 mg/dL — ABNORMAL LOW (ref 8.9–10.3)
Chloride: 106 mmol/L (ref 98–111)
Creatinine, Ser: 1.88 mg/dL — ABNORMAL HIGH (ref 0.44–1.00)
GFR, Estimated: 28 mL/min — ABNORMAL LOW (ref >60)
Glucose, Bld: 214 mg/dL — ABNORMAL HIGH (ref 70–99)
Potassium: 4.2 mmol/L (ref 3.5–5.1)
Sodium: 136 mmol/L (ref 135–145)
Total Bilirubin: 0.9 mg/dL (ref 0.3–1.2)
Total Protein: 7.3 g/dL (ref 6.5–8.1)

## 2022-03-14 LAB — C DIFFICILE QUICK SCREEN W PCR REFLEX
C Diff antigen: NEGATIVE
C Diff interpretation: NOT DETECTED
C Diff toxin: NEGATIVE

## 2022-03-14 LAB — GLUCOSE, CAPILLARY
Glucose-Capillary: 160 mg/dL — ABNORMAL HIGH (ref 70–99)
Glucose-Capillary: 134 mg/dL — ABNORMAL HIGH (ref 70–99)
Glucose-Capillary: 149 mg/dL — ABNORMAL HIGH (ref 70–99)

## 2022-03-14 LAB — CBG MONITORING, ED: Glucose-Capillary: 190 mg/dL — ABNORMAL HIGH (ref 70–99)

## 2022-03-14 LAB — PROTIME-INR
INR: 1 (ref 0.8–1.2)
Prothrombin Time: 13.3 seconds (ref 11.4–15.2)

## 2022-03-14 LAB — LIPASE, BLOOD: Lipase: 68 U/L — ABNORMAL HIGH (ref 11–51)

## 2022-03-14 LAB — BRAIN NATRIURETIC PEPTIDE: B Natriuretic Peptide: 142.2 pg/mL — ABNORMAL HIGH (ref 0.0–100.0)

## 2022-03-14 MED ORDER — CIPROFLOXACIN IN D5W 400 MG/200ML IV SOLN
400.0000 mg | Freq: Once | INTRAVENOUS | Status: AC
  Administered 2022-03-14: 400 mg via INTRAVENOUS
  Filled 2022-03-14: qty 200

## 2022-03-14 MED ORDER — HEPARIN SODIUM (PORCINE) 5000 UNIT/ML IJ SOLN
5000.0000 [IU] | Freq: Three times a day (TID) | INTRAMUSCULAR | Status: DC
  Administered 2022-03-14 – 2022-03-16 (×4): 5000 [IU] via SUBCUTANEOUS
  Filled 2022-03-14 (×5): qty 1

## 2022-03-14 MED ORDER — SODIUM CHLORIDE 0.9 % IV SOLN: INTRAVENOUS | Status: DC

## 2022-03-14 MED ORDER — SODIUM CHLORIDE 0.9 % IV BOLUS
1000.0000 mL | Freq: Once | INTRAVENOUS | Status: AC
  Administered 2022-03-14: 1000 mL via INTRAVENOUS

## 2022-03-14 MED ORDER — ATORVASTATIN CALCIUM 20 MG PO TABS
40.0000 mg | ORAL_TABLET | Freq: Every day | ORAL | Status: DC
  Administered 2022-03-14 – 2022-03-16 (×3): 40 mg via ORAL
  Filled 2022-03-14 (×3): qty 2

## 2022-03-14 MED ORDER — INSULIN ASPART 100 UNIT/ML IJ SOLN
0.0000 [IU] | Freq: Three times a day (TID) | INTRAMUSCULAR | Status: DC
  Administered 2022-03-14 – 2022-03-16 (×5): 1 [IU] via SUBCUTANEOUS
  Filled 2022-03-14 (×5): qty 1

## 2022-03-14 MED ORDER — MORPHINE SULFATE (PF) 2 MG/ML IV SOLN: 2.0000 mg | INTRAVENOUS | Status: DC | PRN

## 2022-03-14 MED ORDER — CYANOCOBALAMIN 500 MCG PO TABS
1000.0000 ug | ORAL_TABLET | Freq: Every day | ORAL | Status: DC
  Administered 2022-03-14 – 2022-03-16 (×3): 1000 ug via ORAL
  Filled 2022-03-14 (×3): qty 2

## 2022-03-14 MED ORDER — ONDANSETRON HCL 4 MG PO TABS: 4.0000 mg | ORAL_TABLET | Freq: Four times a day (QID) | ORAL | Status: DC | PRN

## 2022-03-14 MED ORDER — ADULT MULTIVITAMIN W/MINERALS CH
1.0000 | ORAL_TABLET | Freq: Every day | ORAL | Status: DC
  Administered 2022-03-14 – 2022-03-16 (×3): 1 via ORAL
  Filled 2022-03-14 (×3): qty 1

## 2022-03-14 MED ORDER — ALBUTEROL SULFATE (2.5 MG/3ML) 0.083% IN NEBU: 2.5000 mg | INHALATION_SOLUTION | Freq: Four times a day (QID) | RESPIRATORY_TRACT | Status: DC | PRN

## 2022-03-14 MED ORDER — MECLIZINE HCL 25 MG PO TABS: 12.5000 mg | ORAL_TABLET | Freq: Two times a day (BID) | ORAL | Status: DC | PRN

## 2022-03-14 MED ORDER — ENOXAPARIN SODIUM 30 MG/0.3ML IJ SOSY: 30.0000 mg | PREFILLED_SYRINGE | INTRAMUSCULAR | Status: DC

## 2022-03-14 MED ORDER — TRAZODONE HCL 50 MG PO TABS: 25.0000 mg | ORAL_TABLET | Freq: Every evening | ORAL | Status: DC | PRN

## 2022-03-14 MED ORDER — LORATADINE 10 MG PO TABS
10.0000 mg | ORAL_TABLET | Freq: Every day | ORAL | Status: DC
  Administered 2022-03-14 – 2022-03-16 (×3): 10 mg via ORAL
  Filled 2022-03-14 (×3): qty 1

## 2022-03-14 MED ORDER — ASPIRIN 81 MG PO CHEW
81.0000 mg | CHEWABLE_TABLET | Freq: Every day | ORAL | Status: DC
  Administered 2022-03-14 – 2022-03-16 (×3): 81 mg via ORAL
  Filled 2022-03-14 (×3): qty 1

## 2022-03-14 MED ORDER — METOPROLOL TARTRATE 50 MG PO TABS
50.0000 mg | ORAL_TABLET | Freq: Two times a day (BID) | ORAL | Status: DC
  Administered 2022-03-14 – 2022-03-16 (×5): 50 mg via ORAL
  Filled 2022-03-14: qty 2
  Filled 2022-03-14 (×4): qty 1

## 2022-03-14 MED ORDER — INSULIN ASPART 100 UNIT/ML IJ SOLN: 0.0000 [IU] | Freq: Every day | INTRAMUSCULAR | Status: DC

## 2022-03-14 MED ORDER — ONDANSETRON 4 MG PO TBDP
4.0000 mg | ORAL_TABLET | Freq: Once | ORAL | Status: AC | PRN
  Administered 2022-03-14: 4 mg via ORAL
  Filled 2022-03-14: qty 1

## 2022-03-14 MED ORDER — HYDRALAZINE HCL 20 MG/ML IJ SOLN: 5.0000 mg | INTRAMUSCULAR | Status: DC | PRN

## 2022-03-14 MED ORDER — OXYCODONE-ACETAMINOPHEN 5-325 MG PO TABS: 1.0000 | ORAL_TABLET | ORAL | Status: DC | PRN

## 2022-03-14 MED ORDER — METRONIDAZOLE 500 MG/100ML IV SOLN
500.0000 mg | Freq: Once | INTRAVENOUS | Status: AC
  Administered 2022-03-14: 500 mg via INTRAVENOUS
  Filled 2022-03-14: qty 100

## 2022-03-14 MED ORDER — SERTRALINE HCL 50 MG PO TABS
50.0000 mg | ORAL_TABLET | Freq: Every day | ORAL | Status: DC
  Administered 2022-03-14 – 2022-03-16 (×3): 50 mg via ORAL
  Filled 2022-03-14 (×3): qty 1

## 2022-03-14 MED ORDER — VITAMIN D 25 MCG (1000 UNIT) PO TABS
4000.0000 [IU] | ORAL_TABLET | Freq: Every day | ORAL | Status: DC
  Administered 2022-03-14 – 2022-03-16 (×3): 4000 [IU] via ORAL
  Filled 2022-03-14 (×3): qty 4

## 2022-03-14 MED ORDER — ACETAMINOPHEN 650 MG RE SUPP: 650.0000 mg | Freq: Four times a day (QID) | RECTAL | Status: DC | PRN

## 2022-03-14 MED ORDER — ONDANSETRON HCL 4 MG/2ML IJ SOLN: 4.0000 mg | Freq: Four times a day (QID) | INTRAMUSCULAR | Status: DC | PRN

## 2022-03-14 MED ORDER — ACETAMINOPHEN 325 MG PO TABS: 650.0000 mg | ORAL_TABLET | Freq: Four times a day (QID) | ORAL | Status: DC | PRN

## 2022-03-14 MED ORDER — ONDANSETRON HCL 4 MG/2ML IJ SOLN
4.0000 mg | Freq: Once | INTRAMUSCULAR | Status: AC
  Administered 2022-03-14: 4 mg via INTRAVENOUS
  Filled 2022-03-14: qty 2

## 2022-03-14 NOTE — H&P (Signed)
History and Physical    Misty Villarreal [id number] DOB: Jan 17, 1951 DOA: 03/14/2022  Referring MD/NP/PA:   PCP: Glean Hess, MD   Patient coming from:  The patient is coming from home.  At baseline, pt is independent for most of ADL.        Chief Complaint: abdominal pain, nausea, vomiting, diarrhea  HPI: Misty Villarreal is a 72 y.o. female with medical history significant of IBS, diverticulitis, chronic diarrhea, HTN, HLD, DM, PVD, CAD, dCHF, depression, CKD-3b, who presents with abdominal pain, nausea, vomiting, diarrhea.  Patient states that her symptoms started yesterday afternoon.  She has abdominal pain, which is located in the left lower quadrant, constant, initially severe, cramping, currently mild, nonradiating.  Associate with nausea, vomiting and diarrhea.  Patient has had multiple episodes of nonbilious nonbloody vomiting.  Patient states that she has chronic diarrhea due to IBS, but her diarrhea has worsened since yesterday.  She has had many times of watery diarrhea, no blood in stool.  Denies fever or chills.  No chest pain, cough, shortness breath.  No symptoms of UTI.  Data reviewed independently and ED Course: pt was found to have WBC 12.4, slightly worsening renal function, temperature normal, blood pressure 135/71, heart rate is 74, RR 18, oxygen saturation 97% on room air.  Patient is admitted to [facility] bed as inpatient. Dr. Milas Gain of surgery is consulted.   CT-abd/pelvis: 1. Dilated distal jejunum and multiple mid to lower abdominal ileal segments with normal caliber right lower quadrant small bowel. Findings could be due to ileus or partial small bowel obstruction. A discrete transitional segment could not be found. Occult internal hernia is possible but there are no secondary CT findings to suggest this. 2. There is fluid in the proximal 2/3 of the colon. 3. There is stranding in the anterior left lower abdominal small bowel mesentery which could  be congestive or could be inflammatory such as due to nonspecific enteritis. 4. Mild scattered free fluid in the abdomen and pelvis. 5. Diverticulosis without evidence of diverticulitis. There was sigmoid diverticulitis on the prior study but it is not seen today. 6. Aortic and coronary artery atherosclerosis. 7. Small hiatal hernia. 8. Small umbilical and bilateral inguinal fat hernias. 9. Osteopenia and degenerative change.   Aortic Atherosclerosis (ICD10-I70.0).   EKG: I have personally reviewed.  Sinus rhythm, QTc 442, low voltage, Q-wave in lead III   Review of Systems:   General: no fevers, chills, no body weight gain,  has fatigue HEENT: no blurry vision, hearing changes or sore throat Respiratory: no dyspnea, coughing, wheezing CV: no chest pain, no palpitations GI: has nausea, vomiting, abdominal pain, diarrhea GU: no dysuria, burning on urination, increased urinary frequency, hematuria  Ext: has trace leg edema Neuro: no unilateral weakness, numbness, or tingling, no vision change or hearing loss Skin: no rash, no skin tear. MSK: No muscle spasm, no deformity, no limitation of range of movement in spin Heme: No easy bruising.  Travel history: No recent long distant travel.   Allergy:  Allergies  Allergen Reactions   Augmentin [Amoxicillin-Pot Clavulanate] Diarrhea   Levaquin [Levofloxacin] Nausea And Vomiting and Other (See Comments)    Ankle pain   Tetracycline     Other reaction(s): emesis   Cefaclor Rash   Cephalexin Rash   Rosuvastatin Diarrhea   Sulfa Antibiotics Rash    Other reaction(s): emesis    Past Medical History:  Diagnosis Date   Abnormal glandular Papanicolaou smear of cervix 09/01/2014  Normal Pap 2012    Anemia    Chronic kidney disease    stage 3   Depression    Diabetes mellitus without complication (HCC)    DVT (deep venous thrombosis) (HCC)    Hyperlipidemia    Hypertension    Peripheral vascular disease (HCC)    Peripheral  vascular disease ([place])    Slurred speech 12/03/2016    Past Surgical History:  Procedure Laterality Date   ANGIOPLASTY / STENTING FEMORAL Left 2014, 2013   [place]   BREAST BIOPSY Right 1989   benign   CARPAL TUNNEL RELEASE Left 1980   CATARACT EXTRACTION W/PHACO Right 09/02/2019   Procedure: CATARACT EXTRACTION PHACO AND INTRAOCULAR LENS PLACEMENT ([place]);  Surgeon: Eulogio Bear, MD;  Location: [facility];  Service: Ophthalmology;  Laterality: Right;  [place] 00:27.3 CDE 1.92 AP% Fluid Pack lot # [id number]   CATARACT EXTRACTION W/PHACO Left 10/18/2019   Procedure: CATARACT EXTRACTION PHACO AND INTRAOCULAR LENS PLACEMENT ([facility]) LEFT DIABETIC 2.55  00:28.5;  Surgeon: Eulogio Bear, MD;  Location: [place] [facility];  Service: Ophthalmology;  Laterality: Left;  Diabetic - oral meds   CESAREAN SECTION     CHOLECYSTECTOMY  1987   COLONOSCOPY WITH PROPOFOL N/A 05/08/2017   Procedure: COLONOSCOPY WITH PROPOFOL;  Surgeon: Jonathon Bellows, MD;  Location: [facility];  Service: Gastroenterology;  Laterality: N/A;   KNEE ARTHROPLASTY Right 2009   LOWER EXTREMITY ANGIOGRAPHY Left 07/21/2018   Procedure: LOWER EXTREMITY ANGIOGRAPHY;  Surgeon: Katha Cabal, MD;  Location: [facility] [facility];  Service: Cardiovascular;  Laterality: Left;    Social History:  reports that she quit smoking about 8 years ago. Her smoking use included cigarettes. She has a 23.00 pack-year smoking history. She has never used smokeless tobacco. She reports current alcohol use of about 2.0 standard drinks of alcohol per week. She reports that she does not use drugs.  Family History:  Family History  Problem Relation Age of Onset   Diabetes Mother    Heart failure Father    Diabetes Father    Breast cancer Neg Hx      Prior to Admission medications   Medication Sig Start Date End Date Taking? Authorizing Provider  albuterol (VENTOLIN HFA) 108 (90 Base) MCG/ACT inhaler Inhale 2 puffs into the  lungs every 6 (six) hours as needed. 12/01/20   Glean Hess, MD  aspirin 81 MG chewable tablet Chew 81 mg by mouth daily.     [provider]  atorvastatin (LIPITOR) 40 MG tablet TAKE 1 TABLET DAILY 01/14/22   Glean Hess, MD  blood glucose meter kit and supplies KIT Dispense based on patient and insurance preference. Use to check BS bid 04/04/21   Glean Hess, MD  Cholecalciferol (VITAMIN D) 50 MCG (2000 UT) CAPS Take 2 capsules by mouth daily.    [provider]  clopidogrel (PLAVIX) 75 MG tablet TAKE 1 TABLET DAILY 06/05/21   Schnier, Dolores Lory, MD  Cyanocobalamin (VITAMIN B 12 PO) Take 1,000 mcg by mouth daily. 1097m daily    [provider]  FARXIGA 10 MG TABS tablet TAKE 1 TABLET BY MOUTH DAILY  BEFORE BREAKFAST 10/08/21   BGlean Hess MD  fexofenadine (ALLEGRA) 180 MG tablet Take 180 mg by mouth daily.    [provider]  lisinopril (ZESTRIL) 20 MG tablet TAKE 1 TABLET BY MOUTH DAILY  (REPLACES LISINOPRIL HCT) 10/22/21   BGlean Hess MD  meclizine (ANTIVERT) 12.5  MG tablet TAKE 1 TABLET(12.5 MG) BY MOUTH TWICE DAILY AS NEEDED FOR DIZZINESS 02/25/22   Glean Hess, MD  metoprolol tartrate (LOPRESSOR) 50 MG tablet Take 50 mg by mouth 2 (two) times daily. 10/17/20   [provider]  Multiple Vitamin tablet Take 1 tablet by mouth daily.     [provider]  ondansetron (ZOFRAN-ODT) 4 MG disintegrating tablet Take 1 tablet (4 mg total) by mouth every 8 (eight) hours as needed for nausea or vomiting. 05/25/21   Blake Divine, MD  sertraline (ZOLOFT) 50 MG tablet TAKE 1 TABLET BY MOUTH DAILY 01/28/22   Glean Hess, MD    Physical Exam: Vitals:   03/14/22 0223 03/14/22 0224 03/14/22 0647  BP: 131/74  135/71  Pulse: 72  75  Resp: 18  16  Temp: 98.2 F (36.8 C)  98 F (36.7 C)  TempSrc: Oral  Oral  SpO2: 96%  97%  Weight:  83.5 kg   Height:  5' 2"$  (1.575 m)    General: Not in acute distress HEENT:        Eyes: PERRL, EOMI, no scleral icterus.       ENT: No discharge from the ears and nose, no pharynx injection, no tonsillar enlargement.        Neck: No JVD, no bruit, no mass felt. Heme: No neck lymph node enlargement. Cardiac: S1/S2, RRR, No murmurs, No gallops or rubs. Respiratory: No rales, wheezing, rhonchi or rubs. GI: Soft, nondistended, has tenderness in LLQ, no rebound pain, no organomegaly, BS present. GU: No hematuria Ext: has trace leg edema bilaterally. 1+DP/PT pulse bilaterally. Musculoskeletal: No joint deformities, No joint redness or warmth, no limitation of ROM in spin. Skin: No rashes.  Neuro: Alert, oriented X3, cranial nerves II-XII grossly intact, moves all extremities normally. Psych: Patient is not psychotic, no suicidal or hemocidal ideation.  Labs on Admission: I have personally reviewed following labs and imaging studies  CBC: Recent Labs  Lab 03/14/22 0225  WBC 12.4*  HGB 14.1  HCT 42.4  MCV 89.5  PLT 0000000   Basic Metabolic Panel: Recent Labs  Lab 03/14/22 0225  NA 136  K 4.2  CL 106  CO2 19*  GLUCOSE 214*  BUN 37*  CREATININE 1.88*  CALCIUM 8.7*   GFR: Estimated Creatinine Clearance: 27.1 mL/min (A) (by C-G formula based on SCr of 1.88 mg/dL (H)). Liver Function Tests: Recent Labs  Lab 03/14/22 0225  AST 24  ALT 19  ALKPHOS 84  BILITOT 0.9  PROT 7.3  ALBUMIN 4.2   Recent Labs  Lab 03/14/22 0225  LIPASE 68*   No results for input(s): "AMMONIA" in the last 168 hours. Coagulation Profile: No results for input(s): "INR", "PROTIME" in the last 168 hours. Cardiac Enzymes: No results for input(s): "CKTOTAL", "CKMB", "CKMBINDEX", "TROPONINI" in the last 168 hours. BNP (last 3 results) No results for input(s): "PROBNP" in the last 8760 hours. HbA1C: No results for input(s): "HGBA1C" in the last 72 hours. CBG: No results for input(s): "GLUCAP" in the last 168 hours. Lipid Profile: No results for input(s): "CHOL", "HDL", "LDLCALC",  "TRIG", "CHOLHDL", "LDLDIRECT" in the last 72 hours. Thyroid Function Tests: No results for input(s): "TSH", "T4TOTAL", "FREET4", "T3FREE", "THYROIDAB" in the last 72 hours. Anemia Panel: No results for input(s): "VITAMINB12", "FOLATE", "FERRITIN", "TIBC", "IRON", "RETICCTPCT" in the last 72 hours. Urine analysis:    Component Value Date/Time   COLORURINE YELLOW (A) 03/14/2022 0230   APPEARANCEUR CLEAR (A) 03/14/2022 0230  LABSPEC 1.022 03/14/2022 0230   PHURINE 5.0 03/14/2022 0230   GLUCOSEU >=500 (A) 03/14/2022 0230   HGBUR NEGATIVE 03/14/2022 0230   BILIRUBINUR NEGATIVE 03/14/2022 0230   BILIRUBINUR neg 12/01/2020 1126   KETONESUR NEGATIVE 03/14/2022 0230   PROTEINUR 100 (A) 03/14/2022 0230   UROBILINOGEN 0.2 12/01/2020 1126   NITRITE NEGATIVE 03/14/2022 0230   LEUKOCYTESUR NEGATIVE 03/14/2022 0230   Sepsis Labs: @LABRCNTIP$ (procalcitonin:4,lacticidven:4) )No results found for this or any previous visit (from the past 240 hour(s)).   Radiological Exams on Admission: CT ABDOMEN PELVIS WO CONTRAST  Result Date: 03/14/2022 CLINICAL DATA:  Left lower quadrant pain, nausea, vomiting and diarrhea onset last night. History of advanced sigmoid diverticulosis. EXAM: CT ABDOMEN AND PELVIS WITHOUT CONTRAST TECHNIQUE: Multidetector CT imaging of the abdomen and pelvis was performed following the standard protocol without IV contrast. RADIATION DOSE REDUCTION: This exam was performed according to the departmental dose-optimization program which includes automated exposure control, adjustment of the mA and/or kV according to patient size and/or use of iterative reconstruction technique. COMPARISON:  CT with IV contrast 05/25/2021. FINDINGS: Lower chest: Lung bases are clear of infiltrate. There is eventration and chronic moderate elevation of the anterior right hemidiaphragm, with adjacent atelectasis in the anterior right lower lobe. The cardiac size is normal. There is calcification in the  thoracic aorta and moderate to heavily in the coronary arteries. No pericardial effusion. Small hiatal hernia. Hepatobiliary: No focal abnormality in the unenhanced liver is seen with mild steatosis. Old cholecystectomy. Mild chronic intrahepatic and extrahepatic biliary prominence with stable 12 mm common bile duct without filling defects. Pancreas: Partially atrophic, otherwise unremarkable without contrast. Spleen: Unremarkable without contrast. Adrenals/Urinary Tract: There is no adrenal mass. There are scattered renovascular calcifications at both renal hila but no collecting system stones are seen. Small scar-like cortical defects again noted outer left kidney bilateral partial renal atrophy. There is no hydronephrosis or ureteral stones. No bladder thickening is seen. Stomach/Bowel: There is mild fluid in the stomach with normal wall thickness. The duodenum and proximal jejunum are normal caliber. The distal jejunum and multiple mid to lower abdominal ileal segments are dilated up to 3.2 cm. The right lower quadrant small bowel is normal caliber but is not collapsed. A discrete transitional segment could not be found. There is stranding in the anterior left lower abdominal small bowel mesentery which could be congestive or could be inflammatory such as due to nonspecific enteritis. The appendix is normal and there is fluid throughout the proximal 2/3 of the colon. There are numerous diverticula in the ascending, descending and sigmoid colon but there are no focal inflammatory changes. Vascular/Lymphatic: There is heavy aortoiliac calcific plaque and additional plaques in the visceral branch arteries. No adenopathy is seen. Reproductive: Uterus and bilateral adnexa are unremarkable. Other: There is mild scattered free fluid in the mesenteric folds in the abdomen, small amount in the perihepatic space and small amount of scattered ascites in the pelvis. There is no free air, free hemorrhage or abscess. There  are small bilateral inguinal fat hernias and a small umbilical fat hernia. No incarcerated hernia is seen. Musculoskeletal: Osteopenia with degenerative change thoracic and lumbar spine. Mild hip DJD. No acute or other significant osseous findings. IMPRESSION: 1. Dilated distal jejunum and multiple mid to lower abdominal ileal segments with normal caliber right lower quadrant small bowel. Findings could be due to ileus or partial small bowel obstruction. A discrete transitional segment could not be found. Occult internal hernia is possible but there are  no secondary CT findings to suggest this. 2. There is fluid in the proximal 2/3 of the colon. 3. There is stranding in the anterior left lower abdominal small bowel mesentery which could be congestive or could be inflammatory such as due to nonspecific enteritis. 4. Mild scattered free fluid in the abdomen and pelvis. 5. Diverticulosis without evidence of diverticulitis. There was sigmoid diverticulitis on the prior study but it is not seen today. 6. Aortic and coronary artery atherosclerosis. 7. Small hiatal hernia. 8. Small umbilical and bilateral inguinal fat hernias. 9. Osteopenia and degenerative change. Aortic Atherosclerosis (ICD10-I70.0). Electronically Signed   By: Telford Nab M.D.   On: 03/14/2022 04:13      Assessment/Plan Principal Problem:   Abdominal pain Active Problems:   Nausea vomiting and diarrhea   Partial small bowel obstruction (HCC)   Essential (primary) hypertension   CAD (coronary artery disease)   HLD (hyperlipidemia)   Type II diabetes mellitus with renal manifestations (HCC)   Stage 3b chronic kidney disease (HCC)   Chronic diastolic CHF (congestive heart failure) (HCC)   PAD (peripheral artery disease) (HCC)   Depression   Obesity (BMI 30-39.9)   Assessment and Plan:   Abdominal pain, and nausea vomiting and diarrhea: CT scan showed possible ileus versus partial small bowel obstruction and nonspecific  inflammatory enteritis.  Mild leukocytosis with WBC 12.4, no fever.  Patient was given 1 dose of Flagyl and Cipro in ED, will hold off abx now. Consulted Dr. Milas Gain of surgery.  -admit to MedSurg bed as inpatient -As needed Zofran -As needed morphine and Percocet for pain -IV fluid: 1 L normal saline, 9.5 cc/h -Blood culture -Follow-up C. difficile and GI pathogen panel  Possible partial small bowel obstruction vs. Ileus -Follow-up Dr. Erline Levine recommendation  Essential (primary) hypertension -IV hydralazine as needed -Hold lisinopril due to slightly worsening renal function -Metoprolol  CAD (coronary artery disease): No chest pain -Aspirin, Lipitor -Hold Plavix in case patient need procedure  HLD (hyperlipidemia) -Lipitor  Type II diabetes mellitus with renal manifestations [facility]): Recent A1c 8.0, poorly controlled.  Patient taking Wilder Glade -SSI  Stage 3b chronic kidney disease ([place]): Slightly worsened than baseline.  Recent baseline creatinine 1.3-1.70.  Her creatinine is 1.88, BUN 37, GFR 28.  Likely due to dehydration. -Hold lisinopril -IV fluid as above  Chronic diastolic CHF (congestive heart failure) ([place]): 2D echo 12/04/2016 showed EF of 55-60% with grade 1 diastolic dysfunction.  Patient has trace leg edema, no shortness breath.  CHF seem to be compensated. -Check BNP -Watch volume status closely  PAD (peripheral artery disease) ([place]): S/p of ANGIOPLASTY / STENTING FEMORAL of left leg. -Hold Plavix in case patient needs any procedure -Continue aspirin and Lipitor  Depression -Continue home medications  Obesity (BMI 30-39.9): Body weight 83.5 kg, BMI 33.65 -Exercise and healthy diet -Encourage to lose weight      DVT ppx: SQ Heparin     Code Status: Full code  Family Communication: I offered to call her family, but patient states that her daughter was with her earlier in ED who knows what is going on for her.  She said I do need to call her  family.  Disposition Plan:  Anticipate discharge back to previous environment  Consults called:   Dr. Milas Gain of surgery is consulted.  Admission status and Level of care: Med-Surg:   as inpt     Dispo: The patient is from: Home  Anticipated d/c is to: Home              Anticipated d/c date is: 2 days              Patient currently is not medically stable to d/c.    Severity of Illness:  The appropriate patient status for this patient is INPATIENT. Inpatient status is judged to be reasonable and necessary in order to provide the required intensity of service to ensure the patient's safety. The patient's presenting symptoms, physical exam findings, and initial radiographic and laboratory data in the context of their chronic comorbidities is felt to place them at high risk for further clinical deterioration. Furthermore, it is not anticipated that the patient will be medically stable for discharge from the hospital within 2 midnights of admission.   * I certify that at the point of admission it is my clinical judgment that the patient will require inpatient hospital care spanning beyond 2 midnights from the point of admission due to high intensity of service, high risk for further deterioration and high frequency of surveillance required.*       Date of Service 03/14/2022    Ivor Costa [facility]   If 7PM-7AM, please contact night-coverage [email or web address] 03/14/2022, 7:55 AM

## 2022-03-14 NOTE — Consult Note (Signed)
[facility] SURGICAL CONSULTATION NOTE (initial) - [id number]   HISTORY OF PRESENT ILLNESS (HPI):  72 y.o. female presented to [facility] ED today for evaluation of nausea and vomiting. Patient reports the acute onset of LLQ abdominal pain with associated nausea, emesis, and liquid diarrhea last night at home. Nothing specific seemed to trigger this. She denied any new exposures regarding food, water, nor travel. She does have a history of IBS and chronic diarrhea but this was different. No fever, chills, cough, CP, SOB, urinary changes. Previous abdominal surgeries positive for open cholecystectomy and c-section. Work up in the ED revealed a mild leukocytosis to 12.4K, stable renal function with sCr - 1.88, No significant electrolyte derangements, UA without gross evidence of UTI, and stool studies are pending. She did have CT Abdomen/Pelvis concerning for possible ileus vs enteritis vs partial SBO. She was admitted to the medicine service for rehydration.  This morning, she reports feeling markedly better. Abdominal pain essentially resolved aside from residual soreness. Nausea, emesis, and diarrhea have subsided. She reports feeling hungry.   Surgery is consulted by hospitalist physician Dr. Ivor Costa, MD in this context for evaluation and management of possible enteritis vs ileus vs SBO.   PAST MEDICAL HISTORY (PMH):  Past Medical History:  Diagnosis Date   Abnormal glandular Papanicolaou smear of cervix 09/01/2014   Normal Pap 2012    Anemia    Chronic kidney disease    stage 3   Depression    Diabetes mellitus without complication (HCC)    DVT (deep venous thrombosis) (HCC)    Hyperlipidemia    Hypertension    Peripheral vascular disease ([place])    Peripheral vascular disease ([place])    Slurred speech 12/03/2016     PAST SURGICAL HISTORY ([place]):  Past Surgical History:  Procedure Laterality Date   ANGIOPLASTY / STENTING FEMORAL Left 2014, 2013   [place] Right 1989   benign   CARPAL TUNNEL RELEASE Left 1980   CATARACT EXTRACTION W/PHACO Right 09/02/2019   Procedure: CATARACT EXTRACTION PHACO AND INTRAOCULAR LENS PLACEMENT ([place]);  Surgeon: Eulogio Bear, MD;  Location: [facility] ORS;  Service: Ophthalmology;  Laterality: Right;  [place] 00:27.3 CDE 1.92 AP% Fluid Pack lot # [id number]   CATARACT EXTRACTION W/PHACO Left 10/18/2019   Procedure: CATARACT EXTRACTION PHACO AND INTRAOCULAR LENS PLACEMENT (IOC) LEFT DIABETIC 2.55  00:28.5;  Surgeon: Eulogio Bear, MD;  Location: [place];  Service: Ophthalmology;  Laterality: Left;  Diabetic - oral meds   CESAREAN SECTION     CHOLECYSTECTOMY  1987   COLONOSCOPY WITH PROPOFOL N/A 05/08/2017   Procedure: COLONOSCOPY WITH PROPOFOL;  Surgeon: Jonathon Bellows, MD;  Location: [facility];  Service: Gastroenterology;  Laterality: N/A;   KNEE ARTHROPLASTY Right 2009   LOWER EXTREMITY ANGIOGRAPHY Left 07/21/2018   Procedure: LOWER EXTREMITY ANGIOGRAPHY;  Surgeon: Katha Cabal, MD;  Location: [place] [facility];  Service: Cardiovascular;  Laterality: Left;     MEDICATIONS:  Prior to Admission medications   Medication Sig Start Date End Date Taking? Authorizing Provider  albuterol (VENTOLIN HFA) 108 (90 Base) MCG/ACT inhaler Inhale 2 puffs into the lungs every 6 (six) hours as needed. 12/01/20  Yes Glean Hess, MD  aspirin 81 MG chewable tablet Chew 81 mg by mouth daily.    Yes [provider]  atorvastatin (LIPITOR) 40 MG tablet TAKE 1 TABLET DAILY 01/14/22  Yes Glean Hess, MD  chlorhexidine (PERIDEX) 0.12 %  solution Use as directed 15 mLs in the mouth or throat in the morning, at noon, and at bedtime. 01/02/22  Yes [provider]  Cholecalciferol (VITAMIN D) 50 MCG (2000 UT) CAPS Take 2 capsules by mouth daily.   Yes [provider]  clopidogrel (PLAVIX) 75 MG tablet TAKE 1 TABLET DAILY 06/05/21  Yes Schnier, Dolores Lory, MD  FARXIGA 10  MG TABS tablet TAKE 1 TABLET BY MOUTH DAILY  BEFORE BREAKFAST 10/08/21  Yes Glean Hess, MD  fexofenadine (ALLEGRA) 180 MG tablet Take 180 mg by mouth daily.   Yes [provider]  lisinopril (ZESTRIL) 20 MG tablet TAKE 1 TABLET BY MOUTH DAILY  (REPLACES LISINOPRIL HCT) 10/22/21  Yes Glean Hess, MD  loperamide (IMODIUM A-D) 2 MG tablet Take 2 mg by mouth as needed for diarrhea or loose stools.   Yes [provider]  meclizine (ANTIVERT) 12.5 MG tablet TAKE 1 TABLET(12.5 MG) BY MOUTH TWICE DAILY AS NEEDED FOR DIZZINESS 02/25/22  Yes Glean Hess, MD  metoprolol tartrate (LOPRESSOR) 50 MG tablet Take 50 mg by mouth 2 (two) times daily. 10/17/20  Yes [provider]  ondansetron (ZOFRAN-ODT) 4 MG disintegrating tablet Take 1 tablet (4 mg total) by mouth every 8 (eight) hours as needed for nausea or vomiting. 05/25/21  Yes Blake Divine, MD  sertraline (ZOLOFT) 50 MG tablet TAKE 1 TABLET BY MOUTH DAILY 01/28/22  Yes Glean Hess, MD  blood glucose meter kit and supplies KIT Dispense based on patient and insurance preference. Use to check BS bid 04/04/21   Glean Hess, MD  Cyanocobalamin (VITAMIN B 12 PO) Take 1,000 mcg by mouth daily. 1041m daily    [provider]  Multiple Vitamin tablet Take 1 tablet by mouth daily.     [provider]     ALLERGIES:  Allergies  Allergen Reactions   Augmentin [Amoxicillin-Pot Clavulanate] Diarrhea   Levaquin [Levofloxacin] Nausea And Vomiting and Other (See Comments)    Ankle pain   Tetracycline     Other reaction(s): emesis   Cefaclor Rash   Cephalexin Rash   Rosuvastatin Diarrhea   Sulfa Antibiotics Rash    Other reaction(s): emesis     SOCIAL HISTORY:  Social History   Socioeconomic History   Marital status: Widowed    Spouse name: Not on file   Number of children: 2   Years of education: Not on file   Highest education level: Not on file  Occupational History   Not on file   Tobacco Use   Smoking status: Former    Packs/day: 0.50    Years: 46.00    Total pack years: 23.00    Types: Cigarettes    Quit date: 04/05/2013    Years since quitting: 8.9   Smokeless tobacco: Never  Vaping Use   Vaping Use: Never used  Substance and Sexual Activity   Alcohol use: Yes    Alcohol/week: 2.0 standard drinks of alcohol    Types: 2 Standard drinks or equivalent per week   Drug use: Never   Sexual activity: Not Currently    Partners: Male  Other Topics Concern   Not on file  Social History Narrative   Pt lives alone   Social Determinants of Health   Financial Resource Strain: Low Risk  (11/14/2021)   Overall Financial Resource Strain (CARDIA)    Difficulty of Paying Living Expenses: Not hard at all  Food Insecurity: No Food Insecurity (11/14/2021)   Hunger  Vital Sign    Worried About Charity fundraiser in the Last Year: Never true    Ran Out of Food in the Last Year: Never true  Transportation Needs: No Transportation Needs (11/14/2021)   PRAPARE - Hydrologist (Medical): No    Lack of Transportation (Non-Medical): No  Physical Activity: Insufficiently Active (11/14/2021)   Exercise Vital Sign    Days of Exercise per Week: 7 days    Minutes of Exercise per Session: 20 min  Stress: No Stress Concern Present (11/14/2021)   [place]    Feeling of Stress : Not at all  Social Connections: Socially Isolated (11/14/2021)   Social Connection and Isolation Panel [NHANES]    Frequency of Communication with Friends and Family: More than three times a week    Frequency of Social Gatherings with Friends and Family: More than three times a week    Attends Religious Services: Never    Marine scientist or Organizations: No    Attends Archivist Meetings: Never    Marital Status: Widowed  Intimate Partner Violence: Not At Risk (11/14/2021)   Humiliation,  Afraid, Rape, and Kick questionnaire    Fear of Current or Ex-Partner: No    Emotionally Abused: No    Physically Abused: No    Sexually Abused: No     FAMILY HISTORY:  Family History  Problem Relation Age of Onset   Diabetes Mother    Heart failure Father    Diabetes Father    Breast cancer Neg Hx       REVIEW OF SYSTEMS:  Review of Systems  Constitutional:  Negative for chills and fever.  HENT:  Negative for congestion and sore throat.   Respiratory:  Negative for cough and shortness of breath.   Cardiovascular:  Negative for chest pain and palpitations.  Gastrointestinal:  Positive for abdominal pain, diarrhea, nausea and vomiting. Negative for blood in stool, constipation and melena.  Genitourinary:  Negative for dysuria and urgency.  All other systems reviewed and are negative.   VITAL SIGNS:  Temp:  [98 F (36.7 C)-98.2 F (36.8 C)] 98 F (36.7 C) (02/22 0647) Pulse Rate:  [72-75] 75 (02/22 0647) Resp:  [16-18] 16 (02/22 0647) BP: (131-135)/(71-74) 135/71 (02/22 0647) SpO2:  [96 %-97 %] 97 % (02/22 0647) Weight:  [83.5 kg] 83.5 kg (02/22 0224)     Height: 5' 2"$  (157.5 cm) Weight: 83.5 kg BMI (Calculated): 33.65   INTAKE/OUTPUT:  02/21 0701 - 02/22 0700 In: 1200.3 [IV Piggyback:1200.3] Out: -   PHYSICAL EXAM:  Physical Exam Vitals and nursing note reviewed. Exam conducted with a chaperone present.  Constitutional:      General: She is not in acute distress.    Appearance: Normal appearance. She is obese. She is not ill-appearing or toxic-appearing.     Comments: Patient resting in bed, NAD  HENT:     Head: Normocephalic and atraumatic.  Eyes:     General: No scleral icterus.    Conjunctiva/sclera: Conjunctivae normal.  Cardiovascular:     Rate and Rhythm: Normal rate and regular rhythm.     Pulses: Normal pulses.  Pulmonary:     Effort: Pulmonary effort is normal. No respiratory distress.  Abdominal:     General: Abdomen is flat. A surgical scar is  present. There is no distension.     Palpations: Abdomen is soft.     Tenderness: There  is no abdominal tenderness. There is no guarding or rebound.     Comments: Abdomen is soft, non-tender, non-distended, no rebound/guarding. She is certainly without peritonitis   Genitourinary:    Comments: Deferred Musculoskeletal:     Right lower leg: No edema.     Left lower leg: No edema.  Skin:    General: Skin is warm and dry.     Coloration: Skin is not pale.     Findings: No erythema.  Neurological:     General: No focal deficit present.     Mental Status: She is alert and oriented to person, place, and time.  Psychiatric:        Mood and Affect: Mood normal.        Behavior: Behavior normal.      Labs:     Latest Ref Rng & Units 03/14/2022    2:25 AM 11/27/2021   10:34 AM 05/29/2021   10:05 AM  CBC  WBC 4.0 - 10.5 K/uL 12.4  6.1  6.9   Hemoglobin 12.0 - 15.0 g/dL 14.1  12.5  11.8   Hematocrit 36.0 - 46.0 % 42.4  37.6  36.1   Platelets 150 - 400 K/uL 324  261  348       Latest Ref Rng & Units 03/14/2022    2:25 AM 11/27/2021   10:34 AM 05/29/2021   10:05 AM  CMP  Glucose 70 - 99 mg/dL 214  195  168   BUN 8 - 23 mg/dL 37  37  33   Creatinine 0.44 - 1.00 mg/dL 1.88  1.77  1.90   Sodium 135 - 145 mmol/L 136  135  133   Potassium 3.5 - 5.1 mmol/L 4.2  4.2  4.8   Chloride 98 - 111 mmol/L 106  104  102   CO2 22 - 32 mmol/L 19  21  23   $ Calcium 8.9 - 10.3 mg/dL 8.7  9.0  9.2   Total Protein 6.5 - 8.1 g/dL 7.3  7.2  7.4   Total Bilirubin 0.3 - 1.2 mg/dL 0.9  1.1  0.8   Alkaline Phos 38 - 126 U/L 84  74  99   AST 15 - 41 U/L 24  20  22   $ ALT 0 - 44 U/L 19  17  19      $ Imaging studies:   CT Abdomen/Pelvis (03/14/2022) personally reviewed which shows generalized, mild, dilation of small bowel, no transition zone appreciable, there is no free air nor pneumatosis, noted diverticulosis, no evidence of diverticulitis, and radiology report reviewed below:  IMPRESSION: 1. Dilated distal  jejunum and multiple mid to lower abdominal ileal segments with normal caliber right lower quadrant small bowel. Findings could be due to ileus or partial small bowel obstruction. A discrete transitional segment could not be found. Occult internal hernia is possible but there are no secondary CT findings to suggest this. 2. There is fluid in the proximal 2/3 of the colon. 3. There is stranding in the anterior left lower abdominal small bowel mesentery which could be congestive or could be inflammatory such as due to nonspecific enteritis. 4. Mild scattered free fluid in the abdomen and pelvis. 5. Diverticulosis without evidence of diverticulitis. There was sigmoid diverticulitis on the prior study but it is not seen today. 6. Aortic and coronary artery atherosclerosis. 7. Small hiatal hernia. 8. Small umbilical and bilateral inguinal fat hernias. 9. Osteopenia and degenerative change.   Assessment/Plan: (ICD-10's: K52.9) 72 y.o. female with  crampy abdominal pain and N/V/D likely secondary to enteritis   - Overall clinical picture appears more consistent with enteritis given acute onset with nausea, emesis, and diarrhea with now rapid resolution. IBS exacerbation is also possible. There is no evidence of ischemia nor bowel compromise on imaging. Ileus vs SBO are unlikely at this time given continued bowel function and lack of visualized transition zone on CT. This remains a remote possibility given her surgical history but suspicion is overall very low.    - For now, I think it is reasonable to trial CLD and advance as tolerated  - I do not think she needs NGT nor surgical interventions at this time - Continue IVF support; wean as diet advances  - Monitor abdominal examination; on-going bowel function - Can consider serial KUBs if needed; no indicated currently - Pain control prn; antiemetics prn  - Follow up stool studies if available - Mobilize as tolerated   - Further management per  primary service   - General surgery will sign off for now as there are no needs for now. Please call with questions, concerns, or if clinical condition changes.   All of the above findings and recommendations were discussed with the patient, and all of patient's questions were answered to her expressed satisfaction.  Thank you for the opportunity to participate in this patient's care.   -- Edison Simon, PA-C [facility] 03/14/2022, 8:38 AM M-F: 7am - 4pm

## 2022-03-14 NOTE — Progress Notes (Signed)
PHARMACIST - PHYSICIAN COMMUNICATION  CONCERNING:  Enoxaparin (Lovenox) for DVT Prophylaxis    RECOMMENDATION: Patient was prescribed enoxaprin 35m q24 hours for VTE prophylaxis.   Filed Weights   03/14/22 0224  Weight: 83.5 kg (184 lb)    Body mass index is 33.65 kg/m.  Estimated Creatinine Clearance: 27.1 mL/min (A) (by C-G formula based on SCr of 1.88 mg/dL (H)).  Patient is candidate for enoxaparin 349mevery 24 hours based on CrCl <3060min or Weight <45kg  DESCRIPTION: [facility]  Patient is now receiving enoxaparin 30 mg every 24 hours   NatRenda RollsharmD, MBABeebe Medical Center22/2024 6:35 AM

## 2022-03-14 NOTE — ED Triage Notes (Signed)
Patient presents with nausea, vomiting and diarrhea that started at 2030 on 03/13/2022. No chest pain or shortness of breath. Patient took imodium at 2030 and Tylenol PM. Hx of diverticulitis.

## 2022-03-14 NOTE — ED Provider Notes (Signed)
[facility] Provider Note    Event Date/Time   First MD Initiated Contact with Patient 03/14/22 [phone]     (approximate)   History   Nausea and Emesis   HPI  Misty Villarreal is a 72 y.o. female   Past medical history of irritable bowel syndrome, chronic diarrhea, CKD, diabetes, DVT, hypertension, hyperlipidemia, PVD, diverticulitis who presents with left lower quadrant abdominal cramping pain as well as nausea vomiting diarrhea.  No GI bleeding.  No fever.  Onset was earlier today.  She does have chronic diarrhea in the setting of her IBS but this cramping abdominal pain is different.  She has had diverticulitis in the past.  She has no dysuria or frequency but feels that her urine is stronger smelling.   She denies any other acute medical complaints.  Independent Historian contributed to assessment above: Daughter at bedside  External Medical Documents Reviewed: Emergency department visit from May 2023 where she was diagnosed with diverticulitis      Physical Exam   Triage Vital Signs: ED Triage Vitals  Enc Vitals Group     BP 03/14/22 0223 131/74     Pulse Rate 03/14/22 0223 72     Resp 03/14/22 0223 18     Temp 03/14/22 0223 98.2 F (36.8 C)     Temp Source 03/14/22 0223 Oral     SpO2 03/14/22 0223 96 %     Weight 03/14/22 0224 184 lb (83.5 kg)     Height 03/14/22 0224 5' 2"$  (1.575 m)     Head Circumference --      Peak Flow --      Pain Score 03/14/22 0224 6     Pain Loc --      Pain Edu? --      Excl. in [place]? --     Most recent vital signs: Vitals:   03/14/22 0223  BP: 131/74  Pulse: 72  Resp: 18  Temp: 98.2 F (36.8 C)  SpO2: 96%    General: Awake, no distress.  CV:  Good peripheral perfusion.  Resp:  Normal effort.  Abd:  No distention.  Other:  Awake alert comfortable appearing with normal vital signs.  Appears euvolemic.  She does have a soft abdomen but it is tender to the left lower quadrant.   ED Results /  Procedures / Treatments   Labs (all labs ordered are listed, but only abnormal results are displayed) Labs Reviewed  LIPASE, BLOOD - Abnormal; Notable for the following components:      Result Value   Lipase 68 (*)    All other components within normal limits  COMPREHENSIVE METABOLIC PANEL - Abnormal; Notable for the following components:   CO2 19 (*)    Glucose, Bld 214 (*)    BUN 37 (*)    Creatinine, Ser 1.88 (*)    Calcium 8.7 (*)    GFR, Estimated 28 (*)    All other components within normal limits  CBC - Abnormal; Notable for the following components:   WBC 12.4 (*)    All other components within normal limits  URINALYSIS, ROUTINE W REFLEX MICROSCOPIC - Abnormal; Notable for the following components:   Color, Urine YELLOW (*)    APPearance CLEAR (*)    Glucose, UA >=500 (*)    Protein, ur 100 (*)    All other components within normal limits     I ordered and reviewed the above labs they are notable for blood cell  count is elevated at 12.4, her creatinine is chronically elevated 1.88 and a GFR of 28 at baseline.  Lipase slightly elevated 68.  Urine shows no bacteria or leukocytes  EKG  ED ECG REPORT I, Lucillie Garfinkel, the attending physician, personally viewed and interpreted this ECG.   Date: 03/14/2022  EKG Time: 0242  Rate: 69  Rhythm: nsr  Axis: nl  Intervals:none  ST&T Change: No acute ischemic changes    RADIOLOGY I independently reviewed and interpreted CT scan of the abdomen pelvis and see no obvious infectious or obstructive processes   PROCEDURES:  Critical Care performed: No  Procedures   MEDICATIONS ORDERED IN ED: Medications  metroNIDAZOLE (FLAGYL) IVPB 500 mg (has no administration in time range)  ciprofloxacin (CIPRO) IVPB 400 mg (has no administration in time range)  ondansetron (ZOFRAN-ODT) disintegrating tablet 4 mg (4 mg Oral Given 03/14/22 0228)  sodium chloride 0.9 % bolus 1,000 mL (0 mLs Intravenous Stopped 03/14/22 0405)   ondansetron (ZOFRAN) injection 4 mg (4 mg Intravenous Given 03/14/22 0438)    External physician / consultants:  I spoke with radiologist Dr. Ardyth Gal regarding care plan for this patient.   IMPRESSION / MDM / ASSESSMENT AND PLAN / ED COURSE  I reviewed the triage vital signs and the nursing notes.                                Patient's presentation is most consistent with acute presentation with potential threat to life or bodily function.  Differential diagnosis includes, but is not limited to, diverticulitis, intra-abdominal infection, obstruction, perforation, abscess, dehydration electrolyte disturbance, UTI, viral gastroenteritis or colitis   The patient is on the cardiac monitor to evaluate for evidence of arrhythmia and/or significant heart rate changes.  MDM: Is a patient with a tender abdomen nausea vomiting diarrhea concern for intra-abdominal infection history of diverticulitis, will obtain CT scan-I discussed with Dr. Ardyth Gal radiology given her low GFR and we will do a dry CT scan of the abdomen pelvis and if it proves to be poor visualization then can proceed with IV contrast-enhanced scan.  Patient's pain is well-controlled at this time intermittent cramping pain and declines narcotic pain medication.  Will give an IV crystalloid bolus for dehydration in the setting of vomiting and diarrhea.  Check urine for urinary tract infection.   Labs with a mild white blood cell count a CT scan with no emergent surgical pathologies but several abnormal findings including questionable ileus or small bowel obstruction, partial, and some stranding along the left lower abdominal small bowel mesentery could be related to enteritis.  The patient continues to have profuse watery diarrhea in the emergency department.  She is afebrile and there is no blood in the diarrhea.  No recent hospitalizations, antibiotics or travel.  Given abnormal findings on CT scan questionable partial small bowel  obstruction as well as enteritis, I will admit her for observation and start ciprofloxacin and Flagyl by IV.        FINAL CLINICAL IMPRESSION(S) / ED DIAGNOSES   Final diagnoses:  Enteritis  Abdominal pain, vomiting, and diarrhea  Small bowel obstruction, partial ([place])     Rx / DC Orders   ED Discharge Orders     None        Note:  This document was prepared using Dragon voice recognition software and may include unintentional dictation errors.    Lucillie Garfinkel, MD 03/14/22 [phone]

## 2022-03-14 NOTE — ED Notes (Signed)
1 set of blood cultures collected at this time.

## 2022-03-14 NOTE — ED Notes (Addendum)
Patient resting comfortably in stretcher. Blaine Hamper, MD at bedside. NAD noted.

## 2022-03-15 DIAGNOSIS — E1129 Type 2 diabetes mellitus with other diabetic kidney complication: Secondary | ICD-10-CM

## 2022-03-15 DIAGNOSIS — F32A Depression, unspecified: Secondary | ICD-10-CM

## 2022-03-15 DIAGNOSIS — R112 Nausea with vomiting, unspecified: Secondary | ICD-10-CM

## 2022-03-15 DIAGNOSIS — E782 Mixed hyperlipidemia: Secondary | ICD-10-CM

## 2022-03-15 DIAGNOSIS — Z794 Long term (current) use of insulin: Secondary | ICD-10-CM

## 2022-03-15 DIAGNOSIS — I5032 Chronic diastolic (congestive) heart failure: Secondary | ICD-10-CM

## 2022-03-15 DIAGNOSIS — I25119 Atherosclerotic heart disease of native coronary artery with unspecified angina pectoris: Secondary | ICD-10-CM

## 2022-03-15 LAB — BASIC METABOLIC PANEL
Anion gap: 8 (ref 5–15)
BUN: 31 mg/dL — ABNORMAL HIGH (ref 8–23)
CO2: 16 mmol/L — ABNORMAL LOW (ref 22–32)
Calcium: 8 mg/dL — ABNORMAL LOW (ref 8.9–10.3)
Chloride: 114 mmol/L — ABNORMAL HIGH (ref 98–111)
Creatinine, Ser: 1.58 mg/dL — ABNORMAL HIGH (ref 0.44–1.00)
GFR, Estimated: 35 mL/min — ABNORMAL LOW (ref >60)
Glucose, Bld: 134 mg/dL — ABNORMAL HIGH (ref 70–99)
Potassium: 4.4 mmol/L (ref 3.5–5.1)
Sodium: 138 mmol/L (ref 135–145)

## 2022-03-15 LAB — TYPE AND SCREEN
ABO/RH(D): A POS
Antibody Screen: NEGATIVE

## 2022-03-15 LAB — CBC
HCT: 31.8 % — ABNORMAL LOW (ref 36.0–46.0)
Hemoglobin: 10.3 g/dL — ABNORMAL LOW (ref 12.0–15.0)
MCH: 29.9 pg (ref 26.0–34.0)
MCHC: 32.4 g/dL (ref 30.0–36.0)
MCV: 92.2 fL (ref 80.0–100.0)
Platelets: 184 10*3/uL (ref 150–400)
RBC: 3.45 MIL/uL — ABNORMAL LOW (ref 3.87–5.11)
RDW: 12.7 % (ref 11.5–15.5)
WBC: 7.1 10*3/uL (ref 4.0–10.5)
nRBC: 0 % (ref 0.0–0.2)

## 2022-03-15 LAB — GLUCOSE, CAPILLARY
Glucose-Capillary: 150 mg/dL — ABNORMAL HIGH (ref 70–99)
Glucose-Capillary: 136 mg/dL — ABNORMAL HIGH (ref 70–99)
Glucose-Capillary: 137 mg/dL — ABNORMAL HIGH (ref 70–99)
Glucose-Capillary: 151 mg/dL — ABNORMAL HIGH (ref 70–99)

## 2022-03-15 LAB — HEMOGLOBIN AND HEMATOCRIT, BLOOD
HCT: 32.5 % — ABNORMAL LOW (ref 36.0–46.0)
Hemoglobin: 10.7 g/dL — ABNORMAL LOW (ref 12.0–15.0)

## 2022-03-15 NOTE — Progress Notes (Signed)
       CROSS COVER NOTE  NAME: Minal Kniss MRN: [id number] DOB : 12/01/1950    HPI/Events of Note   Nurse reports 4 gm drop in HB from 14.1 to 10.   Assessment and  Interventions   Assessment: Latest vitals stable 117/49 (68) and heart rate of 64. No evidence of overt GI bleed Plan: Repeat H & H in 4 hours Type and screen, transfuse f hgb elow 7 7       Cristal Generous NP [facility]

## 2022-03-15 NOTE — Hospital Course (Addendum)
Misty Villarreal is a 72 y.o. female with medical history significant of IBS, diverticulitis, chronic diarrhea, HTN, HLD, DM, PVD, CAD, dCHF, depression, CKD-3b. Patient presents to ED from home on 03/14/22 with LLQ abd pain, nausea, vomiting and multiple episodes watery diarrhea that started at 2030 on 03/13/2022.  02/22: in ED, WBC 12.4, slightly worsening renal functionCr 1.88, Lipase 68, temperature normal, blood pressure 135/71, heart rate is 74, RR 18, oxygen saturation 97% on room air. CT abd/pelv (+)concern for ileus vs PSBO, nonspecific enteritis, NO diverticulitis. Patient is admitted to [facility] bed as inpatient. Dr. Milas Gain of surgery was consulted. Per surgical team, more concerning for enteritis vs IBS exacerbation, ileus/SBO unlikely but remotely possible given surgical history. CLD to advance as tolerated, no NGT needed, consider serial KUB if needed, follow stool studies. Surgical team s/o to reconsult prn.  02/23: Stool studies negative including CDiff. Overnight NP responded to Hgb 14.1 --> 10, repeat H/H ordered. Cr improved some to 1.58 Symptoms improved initially then diarrhea again, will leave on gentle IV fluids, pt would like to trial slowly advancing diet  02/24: *** diet, *** diarrhea. BP higher, restarted home lisinopril at lower dose ('10mg'$ ). Hgb stable.   Consultants:  General surgery   Procedures: none      ASSESSMENT & PLAN:   Principal Problem:   Abdominal pain Active Problems:   Nausea vomiting and diarrhea   Partial small bowel obstruction (HCC)   Essential (primary) hypertension   CAD (coronary artery disease)   HLD (hyperlipidemia)   Type II diabetes mellitus with renal manifestations (HCC)   Stage 3b chronic kidney disease (HCC)   Chronic diastolic CHF (congestive heart failure) (HCC)   PAD (peripheral artery disease) (HCC)   Depression   Obesity (BMI 30-39.9)   Enteritis   Abdominal pain, and nausea vomiting and diarrhea Ileus/SBO less  likely than nonspecific enteritis Chronic diarrhea Improved w/ bowel rest  Suspect irritable bowel syndrome Patient was given 1 dose of Flagyl and Cipro in ED but abx were not continued  C. difficile and GI pathogen panel negative  IV fluids, d/c as advance diet adequately Antiemetics Antidiarrheals  Pain control  Follow Blood cultures --> NGx2d GI referral made for outpatient follow up   Essential (primary) hypertension IV hydralazine as needed restarted lisinopril  Continue Metoprolol   Anemia Likely hemodilutional d/t IV fluids  Has been stable Follow CBC outpatient   CAD (coronary artery disease): No chest pain Aspirin, Lipitor Hold Plavix for now given Hgb drop   HLD (hyperlipidemia) Lipitor   Type II diabetes mellitus with renal manifestations [facility]): Recent A1c 8.0, decently controlled.   Patient taking Farxiga SSI inpatient   AKI on Stage 3b chronic kidney disease ([place]) POA - resolved Slightly worsened than baseline on admission.   Recent baseline creatinine 1.3-1.70.   Likely dehydration  Improved w/ IV fluids Restrated lisinopril at lower dose IV fluid as above - d/c now that tolerating po    Chronic diastolic CHF (congestive heart failure) ([place]):  2D echo 12/04/2016 showed EF of 55-60% with grade 1 diastolic dysfunction.  CHF seem to be compensated. Watch volume status closely   PAD (peripheral artery disease) ([place]):  S/p of ANGIOPLASTY / STENTING FEMORAL of left leg. Hold Plavix for now pending repeat HH Continue aspirin and Lipitor   Depression Continue home medications   Obesity (BMI 30-39.9): Body weight 83.5 kg, BMI 33.65 Exercise and healthy diet Encourage to lose weight    DVT prophylaxis: SCD for now  pending repeat HH Pertinent IV fluids/nutrition: NS at 75 mL/h advancing CLD  Central lines / invasive devices: none  Code Status: FULL CODE   Current Admission Status: inpatient  TOC needs / Dispo plan: none Barriers to discharge /  significant pending items: clinical improvement, off IV fliuds and tolerating po

## 2022-03-15 NOTE — Progress Notes (Signed)
PROGRESS NOTE    Misty Villarreal   [id number] DOB: [birth year]  DOA: 03/14/2022 Date of Service: 03/15/22 PCP: Glean Hess, MD     Brief Narrative / Hospital Course:  Misty Villarreal is a 72 y.o. female with medical history significant of IBS, diverticulitis, chronic diarrhea, HTN, HLD, DM, PVD, CAD, dCHF, depression, CKD-3b. Patient presents to ED from home on 03/14/22 with LLQ abd pain, nausea, vomiting and multiple episodes watery diarrhea that started at 2030 on 03/13/2022.  02/22: in ED, WBC 12.4, slightly worsening renal functionCr 1.88, Lipase 68, temperature normal, blood pressure 135/71, heart rate is 74, RR 18, oxygen saturation 97% on room air. CT abd/pelv (+)concern for ileus vs PSBO, nonspecific enteritis, NO diverticulitis. Patient is admitted to [facility] bed as inpatient. Dr. Milas Gain of surgery was consulted. Per surgical team, more concerning for enteritis vs IBS exacerbation, ileus/SBO unlikely but remotely possible given surgical history. CLD to advance as tolerated, no NGT needed, consider serial KUB if needed, follow stool studies. Surgical team s/o to reconsult prn.  02/23: Stool studies negative including CDiff. Overnight NP responded to Hgb 14.1 --> 10, repeat H/H ordered. Cr improved some to 1.58 Symptoms improved initially then diarrhea again, will leave on gentle IV fluids, pt would like to trial slowly advancing diet   Consultants:  General surgery   Procedures: none      ASSESSMENT & PLAN:   Principal Problem:   Abdominal pain Active Problems:   Nausea vomiting and diarrhea   Partial small bowel obstruction (HCC)   Essential (primary) hypertension   CAD (coronary artery disease)   HLD (hyperlipidemia)   Type II diabetes mellitus with renal manifestations (HCC)   Stage 3b chronic kidney disease (HCC)   Chronic diastolic CHF (congestive heart failure) (HCC)   PAD (peripheral artery disease) (HCC)   Depression   Obesity (BMI  30-39.9)   Enteritis   Abdominal pain, and nausea vomiting and diarrhea Ileus/SBO less likely than nonspecific enteritis Improved w/ bowel rest  Patient was given 1 dose of Flagyl and Cipro in ED but abx were not continued  C. difficile and GI pathogen panel negative  IV fluids, d/c as advance diet adequately Antiemetics Pain control  Follow Blood cultures   Essential (primary) hypertension IV hydralazine as needed Hold lisinopril d/t borderline BP and initially had AKI  Continue Metoprolol   CAD (coronary artery disease): No chest pain Aspirin, Lipitor Hold Plavix for now given Hgb drop   HLD (hyperlipidemia) Lipitor   Type II diabetes mellitus with renal manifestations [facility]): Recent A1c 8.0, decently controlled.   Patient taking Farxiga SSI inpatient   AKI on Stage 3b chronic kidney disease ([place]) POA - resolved Slightly worsened than baseline.  Recent baseline creatinine 1.3-1.70.   Likely dehydration  Hold lisinopril IV fluid as above   Chronic diastolic CHF (congestive heart failure) ([place]):  2D echo 12/04/2016 showed EF of 55-60% with grade 1 diastolic dysfunction.  CHF seem to be compensated. Watch volume status closely   PAD (peripheral artery disease) ([place]):  S/p of ANGIOPLASTY / STENTING FEMORAL of left leg. Hold Plavix for now pending repeat HH Continue aspirin and Lipitor   Depression Continue home medications   Obesity (BMI 30-39.9): Body weight 83.5 kg, BMI 33.65 Exercise and healthy diet Encourage to lose weight    DVT prophylaxis: SCD for now pending repeat HH Pertinent IV fluids/nutrition: NS at 75 mL/h advancing CLD  Central lines / invasive devices: none  Code Status: FULL CODE  Current Admission Status: inpatient  TOC needs / Dispo plan: none Barriers to discharge / significant pending items: clinical improvement, off IV fliuds and tolerating po              Subjective / Brief ROS:  Patient reports initially better then  diarrhea again, this has been a chronic issue for her. Abdominal pain is improved though but not resolved, she's like to trial advancing diet  Denies CP/SOB.  Pain controlled.  Denies new weakness.  Tolerating CLD.  Reports no concerns w/ urination/defecation.   Family Communication: none at this time     Objective Findings:  Vitals:   03/14/22 2026 03/15/22 0402 03/15/22 0424 03/15/22 0821  BP: 133/72 (!) 116/49 (!) 117/49 117/70  Pulse: 66 67 64 68  Resp: '17 17 15 18  '$ Temp: 98.1 F (36.7 C) 97.7 F (36.5 C) 97.7 F (36.5 C) 98 F (36.7 C)  TempSrc: Oral Oral Oral   SpO2: 100% 97% 97% 96%  Weight:      Height:        Intake/Output Summary (Last 24 hours) at 03/15/2022 1354 Last data filed at 03/15/2022 0446 Gross per 24 hour  Intake 1183.77 ml  Output --  Net 1183.77 ml   Filed Weights   03/14/22 0224  Weight: 83.5 kg    Examination:  Physical Exam Constitutional:      General: She is not in acute distress. Cardiovascular:     Rate and Rhythm: Normal rate and regular rhythm.  Pulmonary:     Effort: Pulmonary effort is normal.     Breath sounds: Normal breath sounds.  Abdominal:     General: Abdomen is flat. Bowel sounds are normal. There is no distension.     Palpations: Abdomen is soft.  Skin:    General: Skin is warm and dry.  Neurological:     General: No focal deficit present.     Mental Status: She is alert.  Psychiatric:        Mood and Affect: Mood normal.        Behavior: Behavior normal.          Scheduled Medications:   aspirin  81 mg Oral Daily   atorvastatin  40 mg Oral Daily   cholecalciferol  4,000 Units Oral Daily   cyanocobalamin  1,000 mcg Oral Daily   heparin injection (subcutaneous)  5,000 Units Subcutaneous Q8H   insulin aspart  0-5 Units Subcutaneous QHS   insulin aspart  0-9 Units Subcutaneous TID WC   loratadine  10 mg Oral Daily   metoprolol tartrate  50 mg Oral BID   multivitamin with minerals  1 tablet Oral Daily    sertraline  50 mg Oral Daily    Continuous Infusions:  sodium chloride 75 mL/hr at 03/15/22 0427    PRN Medications:  acetaminophen **OR** acetaminophen, albuterol, hydrALAZINE, meclizine, morphine injection, ondansetron **OR** ondansetron (ZOFRAN) IV, oxyCODONE-acetaminophen, traZODone  Antimicrobials from admission:  Anti-infectives (From admission, onward)    Start     Dose/Rate Route Frequency Ordered Stop   03/14/22 0430  metroNIDAZOLE (FLAGYL) IVPB 500 mg        500 mg 100 mL/hr over 60 Minutes Intravenous  Once 03/14/22 0429 03/14/22 1233   03/14/22 0430  ciprofloxacin (CIPRO) IVPB 400 mg        400 mg 200 mL/hr over 60 Minutes Intravenous  Once 03/14/22 0429 03/14/22 0622           Data Reviewed:  I have  personally reviewed the following...  CBC: Recent Labs  Lab 03/14/22 0225 03/15/22 0221 03/15/22 1330  WBC 12.4* 7.1  --   HGB 14.1 10.3* 10.7*  HCT 42.4 31.8* 32.5*  MCV 89.5 92.2  --   PLT 324 184  --    Basic Metabolic Panel: Recent Labs  Lab 03/14/22 0225 03/15/22 0221  NA 136 138  K 4.2 4.4  CL 106 114*  CO2 19* 16*  GLUCOSE 214* 134*  BUN 37* 31*  CREATININE 1.88* 1.58*  CALCIUM 8.7* 8.0*   GFR: Estimated Creatinine Clearance: 32.3 mL/min (A) (by C-G formula based on SCr of 1.58 mg/dL (H)). Liver Function Tests: Recent Labs  Lab 03/14/22 0225  AST 24  ALT 19  ALKPHOS 84  BILITOT 0.9  PROT 7.3  ALBUMIN 4.2   Recent Labs  Lab 03/14/22 0225  LIPASE 68*   No results for input(s): "AMMONIA" in the last 168 hours. Coagulation Profile: Recent Labs  Lab 03/14/22 1227  INR 1.0   Cardiac Enzymes: No results for input(s): "CKTOTAL", "CKMB", "CKMBINDEX", "TROPONINI" in the last 168 hours. BNP (last 3 results) No results for input(s): "PROBNP" in the last 8760 hours. HbA1C: No results for input(s): "HGBA1C" in the last 72 hours. CBG: Recent Labs  Lab 03/14/22 1206 03/14/22 1614 03/14/22 2120 03/15/22 0822 03/15/22 1246   GLUCAP 160* 134* 149* 150* 136*   Lipid Profile: No results for input(s): "CHOL", "HDL", "LDLCALC", "TRIG", "CHOLHDL", "LDLDIRECT" in the last 72 hours. Thyroid Function Tests: No results for input(s): "TSH", "T4TOTAL", "FREET4", "T3FREE", "THYROIDAB" in the last 72 hours. Anemia Panel: No results for input(s): "VITAMINB12", "FOLATE", "FERRITIN", "TIBC", "IRON", "RETICCTPCT" in the last 72 hours. Most Recent Urinalysis On File:     Component Value Date/Time   COLORURINE YELLOW (A) 03/14/2022 0230   APPEARANCEUR CLEAR (A) 03/14/2022 0230   LABSPEC 1.022 03/14/2022 0230   PHURINE 5.0 03/14/2022 0230   GLUCOSEU >=500 (A) 03/14/2022 0230   HGBUR NEGATIVE 03/14/2022 0230   BILIRUBINUR NEGATIVE 03/14/2022 0230   BILIRUBINUR neg 12/01/2020 1126   KETONESUR NEGATIVE 03/14/2022 0230   PROTEINUR 100 (A) 03/14/2022 0230   UROBILINOGEN 0.2 12/01/2020 1126   NITRITE NEGATIVE 03/14/2022 0230   LEUKOCYTESUR NEGATIVE 03/14/2022 0230   Sepsis Labs: '@LABRCNTIP'$ (procalcitonin:4,lacticidven:4) Microbiology: Recent Results (from the past 240 hour(s))  Culture, blood (Routine X 2) w Reflex to ID Panel     Status: None (Preliminary result)   Collection Time: 03/14/22  8:32 AM   Specimen: BLOOD  Result Value Ref Range Status   Specimen Description BLOOD BLOOD LEFT FOREARM  Final   Special Requests   Final    BOTTLES DRAWN AEROBIC AND ANAEROBIC Blood Culture results may not be optimal due to an excessive volume of blood received in culture bottles   Culture   Final    NO GROWTH < 24 HOURS Performed at [facility], [address] [place], [place] [address]    Report Status PENDING  Incomplete  Culture, blood (Routine X 2) w Reflex to ID Panel     Status: None (Preliminary result)   Collection Time: 03/14/22 12:27 PM   Specimen: BLOOD  Result Value Ref Range Status   Specimen Description BLOOD RIGHT ANTECUBITAL  Final   Special Requests   Final    BOTTLES DRAWN AEROBIC AND ANAEROBIC  Blood Culture adequate volume   Culture   Final    NO GROWTH < 24 HOURS Performed at [facility], [address] [place],  [place] [address]    Report Status PENDING  Incomplete  C Difficile Quick Screen w PCR reflex     Status: None   Collection Time: 03/14/22 12:55 PM   Specimen: STOOL  Result Value Ref Range Status   C Diff antigen NEGATIVE NEGATIVE Final   C Diff toxin NEGATIVE NEGATIVE Final   C Diff interpretation No C. difficile detected.  Final    Comment: Performed at [facility], [address] [place], [place] [address]  Gastrointestinal Panel by PCR , Stool     Status: None   Collection Time: 03/14/22 12:55 PM   Specimen: STOOL  Result Value Ref Range Status   Campylobacter species NOT DETECTED NOT DETECTED Final   Plesimonas shigelloides NOT DETECTED NOT DETECTED Final   Salmonella species NOT DETECTED NOT DETECTED Final   Yersinia enterocolitica NOT DETECTED NOT DETECTED Final   Vibrio species NOT DETECTED NOT DETECTED Final   Vibrio cholerae NOT DETECTED NOT DETECTED Final   Enteroaggregative E coli (EAEC) NOT DETECTED NOT DETECTED Final   Enteropathogenic E coli (EPEC) NOT DETECTED NOT DETECTED Final   Enterotoxigenic E coli (ETEC) NOT DETECTED NOT DETECTED Final   Shiga like toxin producing E coli (STEC) NOT DETECTED NOT DETECTED Final   Shigella/Enteroinvasive E coli (EIEC) NOT DETECTED NOT DETECTED Final   Cryptosporidium NOT DETECTED NOT DETECTED Final   Cyclospora cayetanensis NOT DETECTED NOT DETECTED Final   Entamoeba histolytica NOT DETECTED NOT DETECTED Final   Giardia lamblia NOT DETECTED NOT DETECTED Final   Adenovirus F40/41 NOT DETECTED NOT DETECTED Final   Astrovirus NOT DETECTED NOT DETECTED Final   Norovirus GI/GII NOT DETECTED NOT DETECTED Final   Rotavirus A NOT DETECTED NOT DETECTED Final   Sapovirus (I, II, IV, and V) NOT DETECTED NOT DETECTED Final    Comment: Performed at [facility], [address]  [place], [place] [address]      Radiology Studies last 3 days: CT ABDOMEN PELVIS WO CONTRAST  Result Date: 03/14/2022 CLINICAL DATA:  Left lower quadrant pain, nausea, vomiting and diarrhea onset last night. History of advanced sigmoid diverticulosis. EXAM: CT ABDOMEN AND PELVIS WITHOUT CONTRAST TECHNIQUE: Multidetector CT imaging of the abdomen and pelvis was performed following the standard protocol without IV contrast. RADIATION DOSE REDUCTION: This exam was performed according to the departmental dose-optimization program which includes automated exposure control, adjustment of the mA and/or kV according to patient size and/or use of iterative reconstruction technique. COMPARISON:  CT with IV contrast 05/25/2021. FINDINGS: Lower chest: Lung bases are clear of infiltrate. There is eventration and chronic moderate elevation of the anterior right hemidiaphragm, with adjacent atelectasis in the anterior right lower lobe. The cardiac size is normal. There is calcification in the thoracic aorta and moderate to heavily in the coronary arteries. No pericardial effusion. Small hiatal hernia. Hepatobiliary: No focal abnormality in the unenhanced liver is seen with mild steatosis. Old cholecystectomy. Mild chronic intrahepatic and extrahepatic biliary prominence with stable 12 mm common bile duct without filling defects. Pancreas: Partially atrophic, otherwise unremarkable without contrast. Spleen: Unremarkable without contrast. Adrenals/Urinary Tract: There is no adrenal mass. There are scattered renovascular calcifications at both renal hila but no collecting system stones are seen. Small scar-like cortical defects again noted outer left kidney bilateral partial renal atrophy. There is no hydronephrosis or ureteral stones. No bladder thickening is seen. Stomach/Bowel: There is mild fluid in the stomach with normal wall thickness. The duodenum and proximal jejunum are normal caliber. The distal jejunum  and multiple mid  to lower abdominal ileal segments are dilated up to 3.2 cm. The right lower quadrant small bowel is normal caliber but is not collapsed. A discrete transitional segment could not be found. There is stranding in the anterior left lower abdominal small bowel mesentery which could be congestive or could be inflammatory such as due to nonspecific enteritis. The appendix is normal and there is fluid throughout the proximal 2/3 of the colon. There are numerous diverticula in the ascending, descending and sigmoid colon but there are no focal inflammatory changes. Vascular/Lymphatic: There is heavy aortoiliac calcific plaque and additional plaques in the visceral branch arteries. No adenopathy is seen. Reproductive: Uterus and bilateral adnexa are unremarkable. Other: There is mild scattered free fluid in the mesenteric folds in the abdomen, small amount in the perihepatic space and small amount of scattered ascites in the pelvis. There is no free air, free hemorrhage or abscess. There are small bilateral inguinal fat hernias and a small umbilical fat hernia. No incarcerated hernia is seen. Musculoskeletal: Osteopenia with degenerative change thoracic and lumbar spine. Mild hip DJD. No acute or other significant osseous findings. IMPRESSION: 1. Dilated distal jejunum and multiple mid to lower abdominal ileal segments with normal caliber right lower quadrant small bowel. Findings could be due to ileus or partial small bowel obstruction. A discrete transitional segment could not be found. Occult internal hernia is possible but there are no secondary CT findings to suggest this. 2. There is fluid in the proximal 2/3 of the colon. 3. There is stranding in the anterior left lower abdominal small bowel mesentery which could be congestive or could be inflammatory such as due to nonspecific enteritis. 4. Mild scattered free fluid in the abdomen and pelvis. 5. Diverticulosis without evidence of diverticulitis. There was sigmoid  diverticulitis on the prior study but it is not seen today. 6. Aortic and coronary artery atherosclerosis. 7. Small hiatal hernia. 8. Small umbilical and bilateral inguinal fat hernias. 9. Osteopenia and degenerative change. Aortic Atherosclerosis (ICD10-I70.0). Electronically Signed   By: Telford Nab M.D.   On: 03/14/2022 04:13             LOS: 1 day    Emeterio Reeve, DO [facility] 03/15/2022, 1:54 PM    Dictation software may have been used to generate the above note. Typos may occur and escape review in typed/dictated notes. Please contact Dr Sheppard Coil directly for clarity if needed.  Staff may message me via secure chat in [place]  but this may not receive an immediate response,  please page me for urgent matters!  If 7PM-7AM, please contact night coverage [email or web address]

## 2022-03-15 NOTE — Plan of Care (Signed)
Lab did two hemoglobin checks this AM due to a 4 point drop in HG. 2AM on 03/14/22 HG was 14.1, this morning it dropped to 10.3. Lab checked this twice. Vitals normal, BP a little soft, 117/49. Patient has no complaints, no weakness, no SOB, no lightheadedness, no abdominal distention. Paged NP Randol Kern, NP put in order for type and screen and said to hold the 0600 dose of heparin.

## 2022-03-15 NOTE — TOC Initial Note (Signed)
Transition of Care [facility]) - Initial/Assessment Note    Patient Details  Name: Misty Villarreal MRN: [id number] Date of Birth: [birth year]  Transition of Care [facility]) CM/SW Contact:    Laurena Slimmer, RN Phone Number: 03/15/2022, 4:01 PM  Clinical Narrative:                  Transition of Care [facility]) Screening Note   Patient Details  Name: Misty Villarreal Date of Birth: 1950/11/24   Transition of Care [facility]) CM/SW Contact:    Laurena Slimmer, RN Phone Number: 03/15/2022, 4:01 PM    Transition of Care Department [facility]) has reviewed patient and no TOC needs have been identified at this time. We will continue to monitor patient advancement through interdisciplinary progression rounds. If new patient transition needs arise, please place a TOC consult.          Patient Goals and CMS Choice            Expected Discharge Plan and Services                                              Prior Living Arrangements/Services                       Activities of Daily Living Home Assistive Devices/Equipment: Environmental consultant (specify type), Cane (specify quad or straight), Eyeglasses ADL Screening (condition at time of admission) Patient's cognitive ability adequate to safely complete daily activities?: Yes Is the patient deaf or have difficulty hearing?: No Does the patient have difficulty seeing, even when wearing glasses/contacts?: No Does the patient have difficulty concentrating, remembering, or making decisions?: No Patient able to express need for assistance with ADLs?: Yes Does the patient have difficulty dressing or bathing?: No Independently performs ADLs?: Yes (appropriate for developmental age) Does the patient have difficulty walking or climbing stairs?: No Weakness of Legs: None Weakness of Arms/Hands: None  Permission Sought/Granted                  Emotional Assessment              Admission diagnosis:  Enteritis [K52.9] Partial small  bowel obstruction ([place]) [K56.600] Small bowel obstruction, partial ([place]) [K56.600] Abdominal pain, vomiting, and diarrhea [R10.9, R11.10, R19.7] Patient Active Problem List   Diagnosis Date Noted   Partial small bowel obstruction ([place]) 03/14/2022   Abdominal pain 03/14/2022   HLD (hyperlipidemia) 03/14/2022   Type II diabetes mellitus with renal manifestations ([place]) 03/14/2022   Chronic diastolic CHF (congestive heart failure) ([place]) 03/14/2022   Obesity (BMI 30-39.9) 03/14/2022   Depression 03/14/2022   Nausea vomiting and diarrhea 03/14/2022   Enteritis 03/14/2022   Benign essential tremor 12/07/2021   CAD (coronary artery disease) 05/14/2021   Chronic diarrhea 10/27/2020   Atherosclerosis of native arteries of extremity with intermittent claudication ([place]) 05/15/2020   Palpitations 03/29/2020   Stable angina pectoris 03/29/2020   Aortic atherosclerosis ([place]) 11/19/2019   Other fatigue 09/07/2019   Stage 3b chronic kidney disease ([place]) 12/23/2018   Restless leg syndrome 12/23/2018   Unruptured synovial cyst of popliteal space 12/23/2018   Plantar fasciitis of left foot 12/23/2018   B12 deficiency 11/26/2018   Hypogammaglobulinemia ([place]) 11/11/2018   Anemia due to stage 3b chronic kidney disease ([place]) 11/11/2018   Osteopenia determined by x-ray 02/16/2018   Major depressive  disorder with single episode, in partial remission ([place]) 08/04/2017   Neck pain on right side 08/05/2016   Tinnitus of right ear 08/05/2016   Type II diabetes mellitus with complication ([place]) [id number]   Lymphedema 06/21/2015   History of paroxysmal supraventricular tachycardia 06/21/2015   PAD (peripheral artery disease) ([place]) 02/02/2015   Hyperlipidemia associated with type 2 diabetes mellitus ([place]) 09/01/2014   Neuropathy 09/01/2014   Phlebectasia 09/01/2014   Essential (primary) hypertension 09/01/2014   Spondylolisthesis at L4-L5 level 07/05/2014   Arthritis, degenerative 01/31/2014   PCP:   Glean Hess, MD Pharmacy:   [facility] [place], [place] [address] [place] [address] Phone: [phone] Fax: [place], [address] [place] [address] Phone: [phone] Fax: [phone]     Social Determinants of Health (SDOH) Social History: [place]: No Food Insecurity (03/14/2022)  Housing: Low Risk  (03/14/2022)  Transportation Needs: No Transportation Needs (03/14/2022)  Utilities: Not At Risk (03/14/2022)  Alcohol Screen: Low Risk  (11/14/2021)  Depression (PHQ2-9): Low Risk  (12/07/2021)  Financial Resource Strain: Low Risk  (11/14/2021)  Physical Activity: Insufficiently Active (11/14/2021)  Social Connections: Socially Isolated (11/14/2021)  Stress: No Stress Concern Present (11/14/2021)  Tobacco Use: Medium Risk (03/14/2022)   SDOH Interventions:     Readmission Risk Interventions     No data to display

## 2022-03-16 LAB — CBC
HCT: 30.5 % — ABNORMAL LOW (ref 36.0–46.0)
Hemoglobin: 10.1 g/dL — ABNORMAL LOW (ref 12.0–15.0)
MCH: 29.9 pg (ref 26.0–34.0)
MCHC: 33.1 g/dL (ref 30.0–36.0)
MCV: 90.2 fL (ref 80.0–100.0)
Platelets: 180 10*3/uL (ref 150–400)
RBC: 3.38 MIL/uL — ABNORMAL LOW (ref 3.87–5.11)
RDW: 12.5 % (ref 11.5–15.5)
WBC: 6.2 10*3/uL (ref 4.0–10.5)
nRBC: 0 % (ref 0.0–0.2)

## 2022-03-16 LAB — BASIC METABOLIC PANEL
Anion gap: 7 (ref 5–15)
BUN: 28 mg/dL — ABNORMAL HIGH (ref 8–23)
CO2: 19 mmol/L — ABNORMAL LOW (ref 22–32)
Calcium: 8.3 mg/dL — ABNORMAL LOW (ref 8.9–10.3)
Chloride: 113 mmol/L — ABNORMAL HIGH (ref 98–111)
Creatinine, Ser: 1.48 mg/dL — ABNORMAL HIGH (ref 0.44–1.00)
GFR, Estimated: 37 mL/min — ABNORMAL LOW (ref >60)
Glucose, Bld: 163 mg/dL — ABNORMAL HIGH (ref 70–99)
Potassium: 4.8 mmol/L (ref 3.5–5.1)
Sodium: 139 mmol/L (ref 135–145)

## 2022-03-16 LAB — GLUCOSE, CAPILLARY: Glucose-Capillary: 150 mg/dL — ABNORMAL HIGH (ref 70–99)

## 2022-03-16 MED ORDER — LISINOPRIL 10 MG PO TABS
10.0000 mg | ORAL_TABLET | Freq: Every day | ORAL | Status: DC
  Administered 2022-03-16: 10 mg via ORAL
  Filled 2022-03-16: qty 1

## 2022-03-16 MED ORDER — LISINOPRIL 20 MG PO TABS: 10.0000 mg | ORAL_TABLET | Freq: Every day | ORAL | 0 refills | Status: DC

## 2022-03-16 MED ORDER — ONDANSETRON HCL 4 MG PO TABS: 4.0000 mg | ORAL_TABLET | Freq: Four times a day (QID) | ORAL | 0 refills | Status: DC | PRN

## 2022-03-16 MED ORDER — DIPHENOXYLATE-ATROPINE 2.5-0.025 MG PO TABS: 1.0000 | ORAL_TABLET | Freq: Four times a day (QID) | ORAL | 0 refills | Status: DC | PRN

## 2022-03-16 MED ORDER — CLOPIDOGREL BISULFATE 75 MG PO TABS: 75.0000 mg | ORAL_TABLET | Freq: Every day | ORAL | 3 refills | Status: DC

## 2022-03-16 NOTE — Discharge Summary (Signed)
Physician Discharge Summary   Patient: Misty Villarreal MRN: [id number]  DOB: [birth year]   Admit:     Date of Admission: 03/14/2022 Admitted from: home   Discharge: Date of discharge: 03/16/22 Disposition: Home Condition at discharge: good  CODE STATUS: [place]     Discharge Physician: Emeterio Reeve, DO [facility]     PCP: Glean Hess, MD  Recommendations for Outpatient Follow-up:  Follow up with PCP Glean Hess, MD in 1-2 weeks Please obtain labs/tests: CBC, BMP in 1-2 weeks - follow up anemia and AKI/renal function - restart Plavix if Hgb >10 Please monitor BP and adjust meds as needed Ensure GI follow up  Please follow up on the following pending results: none PCP AND OTHER OUTPATIENT PROVIDERS: SEE BELOW FOR SPECIFIC DISCHARGE INSTRUCTIONS PRINTED FOR PATIENT IN ADDITION TO GENERIC AVS PATIENT INFO    Discharge Instructions     Ambulatory referral to Gastroenterology   Complete by: As directed    What is the reason for referral?: Other Comment - chronic diarrhea   Discharge instructions   Complete by: As directed    LOMOTIL RX SENT FOR DIARRHEA, ALSO SENT REFERRAL FOR GASTROENTEROLOGY SPECIALIST FOR POSSIBLE FURTHER WORKUP.   MILD ANEMIA - LIKELY DILUTION OF RED BLOOD CELLS DUE TO IV FLUIDS, AS A PRECAUTION WIL PLAN TO HOLD CLOPIDOGREL AKA PLAVIX UNTIL A REPEAT BLOOD COUNT CAN BE CHECKED.   MILD LOW BLOOD PRESSURE - TAKE USUAL DOSE METOPROLOL AND HALF DOSE LISINOPRIL FOR NOW UNTIL YOUR BLOOD PRESSURE IS RECHECKED AT A DOCTORS OFFICE / CAN RESTART WHOLE TABLET LISINOPRIL IF BP AT HOME CONSISTENTLY HIGHER THAN 140/90   Increase activity slowly   Complete by: As directed          Discharge Diagnoses: Principal Problem:   Abdominal pain Active Problems:   Nausea vomiting and diarrhea   Partial small bowel obstruction (HCC)   Essential (primary) hypertension   CAD (coronary artery disease)   HLD (hyperlipidemia)   Type II  diabetes mellitus with renal manifestations (HCC)   Stage 3b chronic kidney disease (HCC)   Chronic diastolic CHF (congestive heart failure) (HCC)   PAD (peripheral artery disease) (HCC)   Depression   Obesity (BMI 30-39.9)   Enteritis       Hospital Course: Misty Villarreal is a 72 y.o. female with medical history significant of IBS, diverticulitis, chronic diarrhea, HTN, HLD, DM, PVD, CAD, dCHF, depression, CKD-3b. Patient presents to ED from home on 03/14/22 with LLQ abd pain, nausea, vomiting and multiple episodes watery diarrhea that started at 2030 on 03/13/2022.  02/22: in ED, WBC 12.4, slightly worsening renal functionCr 1.88, Lipase 68, temperature normal, blood pressure 135/71, heart rate is 74, RR 18, oxygen saturation 97% on room air. CT abd/pelv (+)concern for ileus vs PSBO, nonspecific enteritis, NO diverticulitis. Patient is admitted to [facility] bed as inpatient. Dr. Milas Gain of surgery was consulted. Per surgical team, more concerning for enteritis vs IBS exacerbation, ileus/SBO unlikely but remotely possible given surgical history. CLD to advance as tolerated, no NGT needed, consider serial KUB if needed, follow stool studies. Surgical team s/o to reconsult prn.  02/23: Stool studies negative including CDiff. Overnight NP responded to Hgb 14.1 --> 10, repeat H/H ordered. Cr improved some to 1.58 Symptoms improved initially then diarrhea again, will leave on gentle IV fluids, pt would like to trial slowly advancing diet  02/24: tolerating solid diet, resolved diarrhea. BP higher, restarted home lisinopril at lower dose (  $'10mg'G$ ). Hgb stable.   Consultants:  General surgery   Procedures: none      ASSESSMENT & PLAN:   Principal Problem:   Abdominal pain Active Problems:   Nausea vomiting and diarrhea   Partial small bowel obstruction (HCC)   Essential (primary) hypertension   CAD (coronary artery disease)   HLD (hyperlipidemia)   Type II diabetes mellitus with  renal manifestations (HCC)   Stage 3b chronic kidney disease (HCC)   Chronic diastolic CHF (congestive heart failure) (HCC)   PAD (peripheral artery disease) (HCC)   Depression   Obesity (BMI 30-39.9)   Enteritis   Abdominal pain, and nausea vomiting and diarrhea Ileus/SBO less likely than nonspecific enteritis Chronic diarrhea Improved w/ bowel rest  Suspect irritable bowel syndrome Patient was given 1 dose of Flagyl and Cipro in ED but abx were not continued  C. difficile and GI pathogen panel negative  IV fluids, d/c'ed as advance diet adequately Antiemetics Antidiarrheals  Pain control  Follow Blood cultures --> NGx2d GI referral made for outpatient follow up   Essential (primary) hypertension IV hydralazine as needed restarted lisinopril at half dose  Continue Metoprolol   Anemia Likely hemodilutional d/t IV fluids  Has been stable Follow CBC outpatient   CAD (coronary artery disease): No chest pain Aspirin, Lipitor Hold Plavix for now given Hgb drop   HLD (hyperlipidemia) Lipitor   Type II diabetes mellitus with renal manifestations [facility]): Recent A1c 8.0, decently controlled.   Patient taking Farxiga SSI inpatient Resume home meds on d/c    AKI on Stage 3b chronic kidney disease ([place]) POA - resolved Slightly worsened than baseline on admission.   Recent baseline creatinine 1.3-1.70.   Likely dehydration  Improved w/ IV fluids Restrated lisinopril at lower dose IV fluid as above - d/c now that tolerating po    Chronic diastolic CHF (congestive heart failure) ([place]):  2D echo 12/04/2016 showed EF of 55-60% with grade 1 diastolic dysfunction.  CHF seem to be compensated. Watch volume status closely   PAD (peripheral artery disease) ([place]):  S/p of ANGIOPLASTY / STENTING FEMORAL of left leg. Hold Plavix for now pending repeat HH Continue aspirin and Lipitor   Depression Continue home medications   Obesity (BMI 30-39.9): Body weight 83.5 kg, BMI  33.65 Exercise and healthy diet Encourage to lose weight            Discharge Instructions  Allergies as of 03/16/2022       Reactions   Augmentin [amoxicillin-pot Clavulanate] Diarrhea   Levaquin [levofloxacin] Nausea And Vomiting, Other (See Comments)   Ankle pain   Tetracycline    Other reaction(s): emesis   Cefaclor Rash   Cephalexin Rash   Rosuvastatin Diarrhea   Sulfa Antibiotics Rash   Other reaction(s): emesis        Medication List     TAKE these medications    albuterol 108 (90 Base) MCG/ACT inhaler Commonly known as: VENTOLIN HFA Inhale 2 puffs into the lungs every 6 (six) hours as needed.   aspirin 81 MG chewable tablet Chew 81 mg by mouth daily.   atorvastatin 40 MG tablet Commonly known as: LIPITOR TAKE 1 TABLET DAILY   blood glucose meter kit and supplies Kit Dispense based on patient and insurance preference. Use to check BS bid   chlorhexidine 0.12 % solution Commonly known as: PERIDEX Use as directed 15 mLs in the mouth or throat in the morning, at noon, and at bedtime.   clopidogrel 75 MG tablet  Commonly known as: PLAVIX Take 1 tablet (75 mg total) by mouth daily. [place] DISCHARGE UNTIL YOUR PRIMARY DOCTOR CAN CHECK A BLOOD COUNT What changed: additional instructions   diphenoxylate-atropine 2.5-0.025 MG tablet Commonly known as: Lomotil Take 1 tablet by mouth 4 (four) times daily as needed for diarrhea or loose stools.   Farxiga 10 MG Tabs tablet Generic drug: dapagliflozin propanediol TAKE 1 TABLET BY MOUTH DAILY  BEFORE BREAKFAST   fexofenadine 180 MG tablet Commonly known as: ALLEGRA Take 180 mg by mouth daily.   lisinopril 20 MG tablet Commonly known as: ZESTRIL Take 0.5 tablets (10 mg total) by mouth daily. HALF TABLET (10 MG) FOR NOW, CAN INCREASE TO 1 TABLET (20 MG) PO DAILY IF BLOOD PRESSURE HIGHER THAN 140/90 What changed: See the new instructions.   loperamide 2 MG tablet Commonly known  as: IMODIUM A-D Take 2 mg by mouth as needed for diarrhea or loose stools.   meclizine 12.5 MG tablet Commonly known as: ANTIVERT TAKE 1 TABLET(12.5 MG) BY MOUTH TWICE DAILY AS NEEDED FOR DIZZINESS   metoprolol tartrate 50 MG tablet Commonly known as: LOPRESSOR Take 50 mg by mouth 2 (two) times daily.   Multiple Vitamin tablet Take 1 tablet by mouth daily.   ondansetron 4 MG disintegrating tablet Commonly known as: ZOFRAN-ODT Take 1 tablet (4 mg total) by mouth every 8 (eight) hours as needed for nausea or vomiting.   ondansetron 4 MG tablet Commonly known as: ZOFRAN Take 1-2 tablets (4-8 mg total) by mouth every 6 (six) hours as needed for nausea or vomiting.   sertraline 50 MG tablet Commonly known as: ZOLOFT TAKE 1 TABLET BY MOUTH DAILY   VITAMIN B 12 PO Take 1,000 mcg by mouth daily. '1000mg'$  daily   Vitamin D 50 MCG (2000 UT) Caps Take 2 capsules by mouth daily.          Allergies  Allergen Reactions   Augmentin [Amoxicillin-Pot Clavulanate] Diarrhea   Levaquin [Levofloxacin] Nausea And Vomiting and Other (See Comments)    Ankle pain   Tetracycline     Other reaction(s): emesis   Cefaclor Rash   Cephalexin Rash   Rosuvastatin Diarrhea   Sulfa Antibiotics Rash    Other reaction(s): emesis     Subjective: pt reports feeling well this mornig, no concerns    Discharge Exam: BP (!) 161/69 (BP Location: Left Arm)   Pulse 63   Temp 98.5 F (36.9 C) (Oral)   Resp 20   Ht '5\' 2"'$  (1.575 m)   Wt 83.5 kg   LMP  (LMP Unknown)   SpO2 98%   BMI 33.65 kg/m  General: Pt is alert, awake, not in acute distress Cardiovascular: RRR, S1/S2, no rubs, no gallops Respiratory: CTA bilaterally, no wheezing, no rhonchi Abdominal: Soft, NT, ND, bowel sounds WNL Extremities: no edema, no cyanosis     The results of significant diagnostics from this hospitalization (including imaging, microbiology, ancillary and laboratory) are listed below for reference.      Microbiology: Recent Results (from the past 240 hour(s))  Culture, blood (Routine X 2) w Reflex to ID Panel     Status: None (Preliminary result)   Collection Time: 03/14/22  8:32 AM   Specimen: BLOOD  Result Value Ref Range Status   Specimen Description BLOOD BLOOD LEFT FOREARM  Final   Special Requests   Final    BOTTLES DRAWN AEROBIC AND ANAEROBIC Blood Culture results may not be optimal due to an excessive  volume of blood received in culture bottles   Culture   Final    NO GROWTH 2 DAYS Performed at [facility], [address] [place], [place] [address]    Report Status PENDING  Incomplete  Culture, blood (Routine X 2) w Reflex to ID Panel     Status: None (Preliminary result)   Collection Time: 03/14/22 12:27 PM   Specimen: BLOOD  Result Value Ref Range Status   Specimen Description BLOOD RIGHT ANTECUBITAL  Final   Special Requests   Final    BOTTLES DRAWN AEROBIC AND ANAEROBIC Blood Culture adequate volume   Culture   Final    NO GROWTH 2 DAYS Performed at [facility], [address] [place], [place] [address]    Report Status PENDING  Incomplete  C Difficile Quick Screen w PCR reflex     Status: None   Collection Time: 03/14/22 12:55 PM   Specimen: STOOL  Result Value Ref Range Status   C Diff antigen NEGATIVE NEGATIVE Final   C Diff toxin NEGATIVE NEGATIVE Final   C Diff interpretation No C. difficile detected.  Final    Comment: Performed at [facility], [address] [place], [place] [address]  Gastrointestinal Panel by PCR , Stool     Status: None   Collection Time: 03/14/22 12:55 PM   Specimen: STOOL  Result Value Ref Range Status   Campylobacter species NOT DETECTED NOT DETECTED Final   Plesimonas shigelloides NOT DETECTED NOT DETECTED Final   Salmonella species NOT DETECTED NOT DETECTED Final   Yersinia enterocolitica NOT DETECTED NOT DETECTED Final   Vibrio species NOT DETECTED NOT DETECTED Final   Vibrio cholerae  NOT DETECTED NOT DETECTED Final   Enteroaggregative E coli (EAEC) NOT DETECTED NOT DETECTED Final   Enteropathogenic E coli (EPEC) NOT DETECTED NOT DETECTED Final   Enterotoxigenic E coli (ETEC) NOT DETECTED NOT DETECTED Final   Shiga like toxin producing E coli (STEC) NOT DETECTED NOT DETECTED Final   Shigella/Enteroinvasive E coli (EIEC) NOT DETECTED NOT DETECTED Final   Cryptosporidium NOT DETECTED NOT DETECTED Final   Cyclospora cayetanensis NOT DETECTED NOT DETECTED Final   Entamoeba histolytica NOT DETECTED NOT DETECTED Final   Giardia lamblia NOT DETECTED NOT DETECTED Final   Adenovirus F40/41 NOT DETECTED NOT DETECTED Final   Astrovirus NOT DETECTED NOT DETECTED Final   Norovirus GI/GII NOT DETECTED NOT DETECTED Final   Rotavirus A NOT DETECTED NOT DETECTED Final   Sapovirus (I, II, IV, and V) NOT DETECTED NOT DETECTED Final    Comment: Performed at [facility], [address] [place],  [address]     Labs: BNP (last 3 results) Recent Labs    03/14/22 0225  BNP [id number]*   Basic Metabolic Panel: Recent Labs  Lab 03/14/22 0225 03/15/22 0221 03/16/22 0555  NA 136 138 139  K 4.2 4.4 4.8  CL 106 114* 113*  CO2 19* 16* 19*  GLUCOSE 214* 134* 163*  BUN 37* 31* 28*  CREATININE 1.88* 1.58* 1.48*  CALCIUM 8.7* 8.0* 8.3*   Liver Function Tests: Recent Labs  Lab 03/14/22 0225  AST 24  ALT 19  ALKPHOS 84  BILITOT 0.9  PROT 7.3  ALBUMIN 4.2   Recent Labs  Lab 03/14/22 0225  LIPASE 68*   No results for input(s): "AMMONIA" in the last 168 hours. CBC: Recent Labs  Lab 03/14/22 0225 03/15/22 0221 03/15/22 1330 03/16/22 0555  WBC 12.4* 7.1  --  6.2  HGB 14.1 10.3* 10.7* 10.1*  HCT 42.4 31.8* 32.5* 30.5*  MCV 89.5 92.2  --  90.2  PLT 324 184  --  180   Cardiac Enzymes: No results for input(s): "CKTOTAL", "CKMB", "CKMBINDEX", "TROPONINI" in the last 168 hours. BNP: Invalid input(s): "POCBNP" CBG: Recent Labs  Lab 03/15/22 0822  03/15/22 1246 03/15/22 1719 03/15/22 2102 03/16/22 0836  GLUCAP 150* 136* 137* 151* 150*   D-Dimer No results for input(s): "DDIMER" in the last 72 hours. Hgb A1c No results for input(s): "HGBA1C" in the last 72 hours. Lipid Profile No results for input(s): "CHOL", "HDL", "LDLCALC", "TRIG", "CHOLHDL", "LDLDIRECT" in the last 72 hours. Thyroid function studies No results for input(s): "TSH", "T4TOTAL", "T3FREE", "THYROIDAB" in the last 72 hours.  Invalid input(s): "FREET3" Anemia work up No results for input(s): "VITAMINB12", "FOLATE", "FERRITIN", "TIBC", "IRON", "RETICCTPCT" in the last 72 hours. Urinalysis    Component Value Date/Time   COLORURINE YELLOW (A) 03/14/2022 0230   APPEARANCEUR CLEAR (A) 03/14/2022 0230   LABSPEC 1.022 03/14/2022 0230   PHURINE 5.0 03/14/2022 0230   GLUCOSEU >=500 (A) 03/14/2022 0230   HGBUR NEGATIVE 03/14/2022 0230   BILIRUBINUR NEGATIVE 03/14/2022 0230   BILIRUBINUR neg 12/01/2020 1126   KETONESUR NEGATIVE 03/14/2022 0230   PROTEINUR 100 (A) 03/14/2022 0230   UROBILINOGEN 0.2 12/01/2020 1126   NITRITE NEGATIVE 03/14/2022 0230   LEUKOCYTESUR NEGATIVE 03/14/2022 0230   Sepsis Labs Recent Labs  Lab 03/14/22 0225 03/15/22 0221 03/16/22 0555  WBC 12.4* 7.1 6.2   Microbiology Recent Results (from the past 240 hour(s))  Culture, blood (Routine X 2) w Reflex to ID Panel     Status: None (Preliminary result)   Collection Time: 03/14/22  8:32 AM   Specimen: BLOOD  Result Value Ref Range Status   Specimen Description BLOOD BLOOD LEFT FOREARM  Final   Special Requests   Final    BOTTLES DRAWN AEROBIC AND ANAEROBIC Blood Culture results may not be optimal due to an excessive volume of blood received in culture bottles   Culture   Final    NO GROWTH 2 DAYS Performed at [facility] [facility], [address] [place], [place] [address]    Report Status PENDING  Incomplete  Culture, blood (Routine X 2) w Reflex to ID Panel     Status: None  (Preliminary result)   Collection Time: 03/14/22 12:27 PM   Specimen: BLOOD  Result Value Ref Range Status   Specimen Description BLOOD RIGHT ANTECUBITAL  Final   Special Requests   Final    BOTTLES DRAWN AEROBIC AND ANAEROBIC Blood Culture adequate volume   Culture   Final    NO GROWTH 2 DAYS Performed at [facility], [address], [place], [place] [address]    Report Status PENDING  Incomplete  C Difficile Quick Screen w PCR reflex     Status: None   Collection Time: 03/14/22 12:55 PM   Specimen: STOOL  Result Value Ref Range Status   C Diff antigen NEGATIVE NEGATIVE Final   C Diff toxin NEGATIVE NEGATIVE Final   C Diff interpretation No C. difficile detected.  Final    Comment: Performed at [facility], [address] [place], [place] [address]  Gastrointestinal Panel by PCR , Stool     Status: None   Collection Time: 03/14/22 12:55 PM   Specimen: STOOL  Result Value Ref Range Status   Campylobacter species NOT DETECTED NOT DETECTED Final   Plesimonas shigelloides NOT DETECTED NOT  DETECTED Final   Salmonella species NOT DETECTED NOT DETECTED Final   Yersinia enterocolitica NOT DETECTED NOT DETECTED Final   Vibrio species NOT DETECTED NOT DETECTED Final   Vibrio cholerae NOT DETECTED NOT DETECTED Final   Enteroaggregative E coli (EAEC) NOT DETECTED NOT DETECTED Final   Enteropathogenic E coli (EPEC) NOT DETECTED NOT DETECTED Final   Enterotoxigenic E coli (ETEC) NOT DETECTED NOT DETECTED Final   Shiga like toxin producing E coli (STEC) NOT DETECTED NOT DETECTED Final   Shigella/Enteroinvasive E coli (EIEC) NOT DETECTED NOT DETECTED Final   Cryptosporidium NOT DETECTED NOT DETECTED Final   Cyclospora cayetanensis NOT DETECTED NOT DETECTED Final   Entamoeba histolytica NOT DETECTED NOT DETECTED Final   Giardia lamblia NOT DETECTED NOT DETECTED Final   Adenovirus F40/41 NOT DETECTED NOT DETECTED Final   Astrovirus NOT DETECTED NOT DETECTED Final    Norovirus GI/GII NOT DETECTED NOT DETECTED Final   Rotavirus A NOT DETECTED NOT DETECTED Final   Sapovirus (I, II, IV, and V) NOT DETECTED NOT DETECTED Final    Comment: Performed at [facility], [address] [place], [place] [address]   Imaging CT ABDOMEN PELVIS WO CONTRAST  Result Date: 03/14/2022 CLINICAL DATA:  Left lower quadrant pain, nausea, vomiting and diarrhea onset last night. History of advanced sigmoid diverticulosis. EXAM: CT ABDOMEN AND PELVIS WITHOUT CONTRAST TECHNIQUE: Multidetector CT imaging of the abdomen and pelvis was performed following the standard protocol without IV contrast. RADIATION DOSE REDUCTION: This exam was performed according to the departmental dose-optimization program which includes automated exposure control, adjustment of the mA and/or kV according to patient size and/or use of iterative reconstruction technique. COMPARISON:  CT with IV contrast 05/25/2021. FINDINGS: Lower chest: Lung bases are clear of infiltrate. There is eventration and chronic moderate elevation of the anterior right hemidiaphragm, with adjacent atelectasis in the anterior right lower lobe. The cardiac size is normal. There is calcification in the thoracic aorta and moderate to heavily in the coronary arteries. No pericardial effusion. Small hiatal hernia. Hepatobiliary: No focal abnormality in the unenhanced liver is seen with mild steatosis. Old cholecystectomy. Mild chronic intrahepatic and extrahepatic biliary prominence with stable 12 mm common bile duct without filling defects. Pancreas: Partially atrophic, otherwise unremarkable without contrast. Spleen: Unremarkable without contrast. Adrenals/Urinary Tract: There is no adrenal mass. There are scattered renovascular calcifications at both renal hila but no collecting system stones are seen. Small scar-like cortical defects again noted outer left kidney bilateral partial renal atrophy. There is no hydronephrosis or ureteral  stones. No bladder thickening is seen. Stomach/Bowel: There is mild fluid in the stomach with normal wall thickness. The duodenum and proximal jejunum are normal caliber. The distal jejunum and multiple mid to lower abdominal ileal segments are dilated up to 3.2 cm. The right lower quadrant small bowel is normal caliber but is not collapsed. A discrete transitional segment could not be found. There is stranding in the anterior left lower abdominal small bowel mesentery which could be congestive or could be inflammatory such as due to nonspecific enteritis. The appendix is normal and there is fluid throughout the proximal 2/3 of the colon. There are numerous diverticula in the ascending, descending and sigmoid colon but there are no focal inflammatory changes. Vascular/Lymphatic: There is heavy aortoiliac calcific plaque and additional plaques in the visceral branch arteries. No adenopathy is seen. Reproductive: Uterus and bilateral adnexa are unremarkable. Other: There is mild scattered free fluid in the mesenteric folds in the abdomen, small amount  in the perihepatic space and small amount of scattered ascites in the pelvis. There is no free air, free hemorrhage or abscess. There are small bilateral inguinal fat hernias and a small umbilical fat hernia. No incarcerated hernia is seen. Musculoskeletal: Osteopenia with degenerative change thoracic and lumbar spine. Mild hip DJD. No acute or other significant osseous findings. IMPRESSION: 1. Dilated distal jejunum and multiple mid to lower abdominal ileal segments with normal caliber right lower quadrant small bowel. Findings could be due to ileus or partial small bowel obstruction. A discrete transitional segment could not be found. Occult internal hernia is possible but there are no secondary CT findings to suggest this. 2. There is fluid in the proximal 2/3 of the colon. 3. There is stranding in the anterior left lower abdominal small bowel mesentery which could be  congestive or could be inflammatory such as due to nonspecific enteritis. 4. Mild scattered free fluid in the abdomen and pelvis. 5. Diverticulosis without evidence of diverticulitis. There was sigmoid diverticulitis on the prior study but it is not seen today. 6. Aortic and coronary artery atherosclerosis. 7. Small hiatal hernia. 8. Small umbilical and bilateral inguinal fat hernias. 9. Osteopenia and degenerative change. Aortic Atherosclerosis (ICD10-I70.0). Electronically Signed   By: Telford Nab M.D.   On: 03/14/2022 04:13      Time coordinating discharge: less than 30 minutes  SIGNED:  Emeterio Reeve DO [facility]

## 2022-03-16 NOTE — Progress Notes (Signed)
Patient discharge education completed including medication changes, follow up appointments and s/s of concern. IV's removed without complications. Patient wheeled to the main exit and assisted into private vehicle. Discharged to the care of family in stable condition.

## 2022-03-18 ENCOUNTER — Telehealth: Payer: Self-pay | Admitting: *Deleted

## 2022-03-18 ENCOUNTER — Telehealth: Payer: Self-pay

## 2022-03-18 NOTE — Transitions of Care (Post Inpatient/ED Visit) (Signed)
   03/18/2022  Name: Misty Villarreal MRN: [id number] DOB: 25-Dec-1950  Today's TOC FU Call Status: Today's TOC FU Call Status:: Successful TOC FU Call Competed TOC FU Call Complete Date: 03/18/22  Transition Care Management Follow-up Telephone Call Date of Discharge: 03/16/22 Discharge Facility: [facility]) Type of Discharge: Inpatient Admission Primary Inpatient Discharge Diagnosis:: ENTERITIS How have you been since you were released from the hospital?: Better Any questions or concerns?: No  Items Reviewed: Did you receive and understand the discharge instructions provided?: Yes Medications obtained and verified?: Yes (Medications Reviewed) Any new allergies since your discharge?: No Dietary orders reviewed?: No Do you have support at home?: Yes People in Home: child(ren), adult Name of Support/Comfort Primary Source: [facility] and Equipment/Supplies: [place] Ordered?: No Any new equipment or medical supplies ordered?: No  Functional Questionnaire: Do you need assistance with bathing/showering or dressing?: No Do you need assistance with meal preparation?: No Do you need assistance with eating?: No Do you have difficulty maintaining continence: No Do you need assistance with getting out of bed/getting out of a chair/moving?: No Do you have difficulty managing or taking your medications?: No  Folllow up appointments reviewed: PCP Follow-up appointment confirmed?: Yes Date of PCP follow-up appointment?: 03/25/22 Follow-up Provider: [facility] Follow-up appointment confirmed?: No Reason Specialist Follow-Up Not Confirmed: Patient has Specialist Provider Number and will Call for Appointment (Per patien the GU specialist will contact her. The referral was sent by the hospitalist) Do you need transportation to your follow-up appointment?: No Do you understand care options if your condition(s) worsen?:  Yes-patient verbalized understanding  SDOH Interventions Today    Flowsheet Row Most Recent Value  SDOH Interventions   Food Insecurity Interventions Intervention Not Indicated  Housing Interventions Intervention Not Indicated  Transportation Interventions Intervention Not Indicated      Interventions Today    Flowsheet Row Most Recent Value  Nutrition Interventions   Nutrition Discussed/Reviewed Nutrition Discussed  [Patient declined UHC meal plan]  Pharmacy Interventions   Pharmacy Dicussed/Reviewed Pharmacy Topics Discussed  [RN discussed monitoring BP and instructions MILD LOW BLOOD PRESSURE - TAKE USUAL DOSE METOPROLOL AND HALF DOSE LISINOPRIL FOR NOW UNTIL YOUR BP IS RECHECKED AT A DOCTORS OFFICE / CAN RESTART WHOLE TABLET LISINOPRIL IF BP CONSISTENTLY [place] [facility] [phone]

## 2022-03-18 NOTE — Transitions of Care (Post Inpatient/ED Visit) (Signed)
   03/18/2022  Name: Earleen Smikle MRN: [id number] DOB: 1951/01/13  Today's TOC FU Call Status: Today's TOC FU Call Status:: Successful TOC FU Call Competed TOC FU Call Complete Date: 03/18/22  Transition Care Management Follow-up Telephone Call Date of Discharge: 03/16/22 Discharge Facility: [facility]) Type of Discharge: Inpatient Admission Primary Inpatient Discharge Diagnosis:: gastroenteritis How have you been since you were released from the hospital?: Better  Items Reviewed: Did you receive and understand the discharge instructions provided?: No Medications obtained and verified?: Yes (Medications Reviewed) Any new allergies since your discharge?: No Dietary orders reviewed?: Yes Do you have support at home?: Yes People in Home: child(ren), adult  Home Care and Equipment/Supplies: [place] Ordered?: NA Any new equipment or medical supplies ordered?: NA  Functional Questionnaire: Do you need assistance with bathing/showering or dressing?: No Do you need assistance with meal preparation?: No Do you need assistance with eating?: No Do you have difficulty maintaining continence: No Do you need assistance with getting out of bed/getting out of a chair/moving?: No Do you have difficulty managing or taking your medications?: No  Folllow up appointments reviewed: PCP Follow-up appointment confirmed?: Yes Date of PCP follow-up appointment?: 03/25/22 Follow-up Provider: [facility] Follow-up appointment confirmed?: NA Do you need transportation to your follow-up appointment?: No Do you understand care options if your condition(s) worsen?: Yes-patient verbalized understanding    [place], [place] Direct Dial [phone]

## 2022-03-19 LAB — CULTURE, BLOOD (ROUTINE X 2)
Culture: NO GROWTH
Culture: NO GROWTH
Special Requests: ADEQUATE

## 2022-03-25 ENCOUNTER — Ambulatory Visit (INDEPENDENT_AMBULATORY_CARE_PROVIDER_SITE_OTHER): Payer: Medicare Other | Admitting: Internal Medicine

## 2022-03-25 ENCOUNTER — Encounter: Payer: Self-pay | Admitting: Internal Medicine

## 2022-03-25 VITALS — BP 130/72 | HR 64 | Ht 62.0 in | Wt 189.0 lb

## 2022-03-25 DIAGNOSIS — N179 Acute kidney failure, unspecified: Secondary | ICD-10-CM | POA: Diagnosis not present

## 2022-03-25 DIAGNOSIS — D631 Anemia in chronic kidney disease: Secondary | ICD-10-CM

## 2022-03-25 DIAGNOSIS — F324 Major depressive disorder, single episode, in partial remission: Secondary | ICD-10-CM | POA: Diagnosis not present

## 2022-03-25 DIAGNOSIS — R1084 Generalized abdominal pain: Secondary | ICD-10-CM

## 2022-03-25 DIAGNOSIS — N1832 Chronic kidney disease, stage 3b: Secondary | ICD-10-CM

## 2022-03-25 DIAGNOSIS — I25119 Atherosclerotic heart disease of native coronary artery with unspecified angina pectoris: Secondary | ICD-10-CM

## 2022-03-25 DIAGNOSIS — E118 Type 2 diabetes mellitus with unspecified complications: Secondary | ICD-10-CM

## 2022-03-25 NOTE — Assessment & Plan Note (Signed)
resolved with continued intermittent diarrhea may take imodium as needed agree with GI consult

## 2022-03-25 NOTE — Progress Notes (Signed)
Date:  03/25/2022   Name:  Misty Villarreal   DOB:  1950/03/22   MRN:  [id number]   Chief Complaint: Hospitalization Follow-up Hospital follow up.  Admitted to [facility] 03/14/22 - 03/16/22.  TOC call on 03/18/22. She is doing well.  One day of diarrhea last week - took imodium and symptoms have not returned.  However, she continues to have episodic diarrhea that is severely limiting her activities outside the home.  HPI Recommendations for Outpatient Follow-up:  Follow up with PCP Glean Hess, MD in 1-2 weeks Please obtain labs/tests: CBC, BMP in 1-2 weeks - follow up anemia and AKI/renal function - restart Plavix if Hgb >10 Please monitor BP and adjust meds as needed Ensure GI follow up  Please follow up on the following pending results: none PCP AND OTHER OUTPATIENT PROVIDERS: SEE BELOW FOR SPECIFIC DISCHARGE INSTRUCTIONS PRINTED FOR PATIENT IN ADDITION TO GENERIC  AVS PATIENT INFO   Abdominal pain, and nausea vomiting and diarrhea Ileus/SBO less likely than nonspecific enteritis Chronic diarrhea Improved w/ bowel rest  Suspect irritable bowel syndrome Patient was given 1 dose of Flagyl and Cipro in ED but abx were not continued  C. difficile and GI pathogen panel negative  IV fluids, d/c'ed as advance diet adequately Antiemetics Antidiarrheals  Pain control  Follow Blood cultures --> NGx2d GI referral made for outpatient follow up    >>>  no recurrent pain. Eating and drinking normally.  One day of diarrhea.  Recommend she take imodium as needed for diarrhea and prophylacticly.  GI referral has been placed to [facility]. Awaiting a call and scheduling.   Essential (primary) hypertension IV hydralazine as needed restarted lisinopril at half dose  Continue Metoprolol  >>>  She has resumed lisinopril at full dose.  BP have been normal.  She has not had any chest pain, HA or lightheadedness.  Anemia Likely hemodilutional d/t IV fluids  Has been stable Follow CBC  outpatient    CAD (coronary artery disease): No chest pain Aspirin, Lipitor Hold Plavix for now given Hgb drop  >>> last Hgb 10.1.  She denies bleeding.  She resumed Plavik on discharge.  Will check CBC today.  HLD (hyperlipidemia) Lipitor   Type II diabetes mellitus with renal manifestations [facility]): Recent A1c 8.0, decently controlled.   Patient taking Farxiga SSI inpatient Resume home meds on d/c   >>> she continues efforts with diet and exercise.  Weight has remained stable.  Last A1C 8.  Will recheck today.   AKI on Stage 3b chronic kidney disease ([place]) POA - resolved Slightly worsened than baseline on admission.   Recent baseline creatinine 1.3-1.70.   Likely dehydration  Improved w/ IV fluids Restrated lisinopril at lower dose IV fluid as above - d/c now that tolerating po    >>> Seeing nephrology next week.  Will get BMP  Chronic diastolic CHF (congestive heart failure) ([place]):  2D echo 12/04/2016 showed EF of 55-60% with grade 1 diastolic dysfunction.  CHF seem to be compensated. Watch volume status closely   PAD (peripheral artery disease) ([place]):  S/p of ANGIOPLASTY / STENTING FEMORAL of left leg. Hold Plavix for now pending repeat HH Continue aspirin and Lipitor   Depression Continue home medications   Obesity (BMI 30-39.9): Body weight 83.5 kg, BMI 33.65 Exercise and healthy diet Encourage to lose weight   Lab Results  Component Value Date   NA 139 03/16/2022   K 4.8 03/16/2022   CO2 19 (L) 03/16/2022  GLUCOSE 163 (H) 03/16/2022   BUN 28 (H) 03/16/2022   CREATININE 1.48 (H) 03/16/2022   CALCIUM 8.3 (L) 03/16/2022   EGFR 40 (L) 04/02/2021   GFRNONAA 37 (L) 03/16/2022   Lab Results  Component Value Date   CHOL 211 (H) 12/07/2021   HDL 47 12/07/2021   LDLCALC 117 (H) 12/07/2021   LDLDIRECT 76 07/14/2020   TRIG 268 (H) 12/07/2021   CHOLHDL 4.5 (H) 12/07/2021   Lab Results  Component Value Date   TSH 3.200 12/07/2021   Lab Results  Component  Value Date   HGBA1C 8.0 (H) 12/07/2021   Lab Results  Component Value Date   WBC 6.2 03/16/2022   HGB 10.1 (L) 03/16/2022   HCT 30.5 (L) 03/16/2022   MCV 90.2 03/16/2022   PLT 180 03/16/2022   Lab Results  Component Value Date   ALT 19 03/14/2022   AST 24 03/14/2022   ALKPHOS 84 03/14/2022   BILITOT 0.9 03/14/2022   Lab Results  Component Value Date   VD25OH 21.79 (L) 09/13/2019     Review of Systems  Constitutional:  Negative for fatigue and unexpected weight change.  HENT:  Negative for nosebleeds and trouble swallowing.   Eyes:  Negative for visual disturbance.  Respiratory:  Negative for cough, chest tightness, shortness of breath and wheezing.   Cardiovascular:  Negative for chest pain, palpitations and leg swelling.  Gastrointestinal:  Positive for diarrhea. Negative for abdominal pain, blood in stool and constipation.  Neurological:  Negative for dizziness, weakness, light-headedness and headaches.  Psychiatric/Behavioral:  Negative for dysphoric mood and sleep disturbance. The patient is not nervous/anxious.     Patient Active Problem List   Diagnosis Date Noted   Abdominal pain 03/14/2022   HLD (hyperlipidemia) 03/14/2022   Type II diabetes mellitus with renal manifestations ([place]) 03/14/2022   Chronic diastolic CHF (congestive heart failure) ([place]) 03/14/2022   Obesity (BMI 30-39.9) 03/14/2022   Enteritis 03/14/2022   Benign essential tremor 12/07/2021   Coronary artery disease involving native coronary artery of native heart with angina pectoris ([place]) 05/14/2021   Atherosclerosis of native arteries of extremity with intermittent claudication ([place]) 05/15/2020   Palpitations 03/29/2020   Stable angina pectoris 03/29/2020   Aortic atherosclerosis ([place]) 11/19/2019   Other fatigue 09/07/2019   Stage 3b chronic kidney disease ([place]) 12/23/2018   Restless leg syndrome 12/23/2018   Unruptured synovial cyst of popliteal space 12/23/2018   Plantar fasciitis of left  foot 12/23/2018   B12 deficiency 11/26/2018   Hypogammaglobulinemia ([place]) 11/11/2018   Anemia due to stage 3b chronic kidney disease ([place]) 11/11/2018   Osteopenia determined by x-ray 02/16/2018   Major depressive disorder with single episode, in partial remission ([place]) 08/04/2017   Neck pain on right side 08/05/2016   Tinnitus of right ear 08/05/2016   Type II diabetes mellitus with complication ([place]) [id number]   Lymphedema 06/21/2015   History of paroxysmal supraventricular tachycardia 06/21/2015   PAD (peripheral artery disease) ([place]) 02/02/2015   Hyperlipidemia associated with type 2 diabetes mellitus ([place]) 09/01/2014   Neuropathy 09/01/2014   Phlebectasia 09/01/2014   Essential (primary) hypertension 09/01/2014   Spondylolisthesis at L4-L5 level 07/05/2014   Arthritis, degenerative 01/31/2014    Allergies  Allergen Reactions   Augmentin [Amoxicillin-Pot Clavulanate] Diarrhea   Levaquin [Levofloxacin] Nausea And Vomiting and Other (See Comments)    Ankle pain   Tetracycline     Other reaction(s): emesis   Cefaclor Rash   Cephalexin Rash   Rosuvastatin Diarrhea  Sulfa Antibiotics Rash    Other reaction(s): emesis    Past Surgical History:  Procedure Laterality Date   ANGIOPLASTY / STENTING FEMORAL Left 2014, 2013   BRAIN MENINGIOMA EXCISION  1988   BREAST BIOPSY Right 1989   benign   CARPAL TUNNEL RELEASE Left 1980   CATARACT EXTRACTION W/PHACO Right 09/02/2019   Procedure: CATARACT EXTRACTION PHACO AND INTRAOCULAR LENS PLACEMENT ([place]);  Surgeon: Eulogio Bear, MD;  Location: [facility] ORS;  Service: Ophthalmology;  Laterality: Right;  [place] 00:27.3 CDE 1.92 AP% Fluid Pack lot # [id number]   CATARACT EXTRACTION W/PHACO Left 10/18/2019   Procedure: CATARACT EXTRACTION PHACO AND INTRAOCULAR LENS PLACEMENT ([facility]) LEFT DIABETIC 2.55  00:28.5;  Surgeon: Eulogio Bear, MD;  Location: [place];  Service: Ophthalmology;  Laterality: Left;  Diabetic - oral meds    CESAREAN SECTION     CHOLECYSTECTOMY  1987   COLONOSCOPY WITH PROPOFOL N/A 05/08/2017   Procedure: COLONOSCOPY WITH PROPOFOL;  Surgeon: Jonathon Bellows, MD;  Location: [facility] ENDOSCOPY;  Service: Gastroenterology;  Laterality: N/A;   KNEE ARTHROPLASTY Right 2009   LOWER EXTREMITY ANGIOGRAPHY Left 07/21/2018   Procedure: LOWER EXTREMITY ANGIOGRAPHY;  Surgeon: Katha Cabal, MD;  Location: [place] CV LAB;  Service: Cardiovascular;  Laterality: Left;    Social History   Tobacco Use   Smoking status: Former    Packs/day: 0.50    Years: 46.00    Total pack years: 23.00    Types: Cigarettes    Quit date: 04/05/2013    Years since quitting: 8.9   Smokeless tobacco: Never  Vaping Use   Vaping Use: Never used  Substance Use Topics   Alcohol use: Yes    Alcohol/week: 2.0 standard drinks of alcohol    Types: 2 Standard drinks or equivalent per week   Drug use: Never     Medication list has been reviewed and updated.  Current Meds  Medication Sig   albuterol (VENTOLIN HFA) 108 (90 Base) MCG/ACT inhaler Inhale 2 puffs into the lungs every 6 (six) hours as needed.   aspirin 81 MG chewable tablet Chew 81 mg by mouth daily.    atorvastatin (LIPITOR) 40 MG tablet TAKE 1 TABLET DAILY   blood glucose meter kit and supplies KIT Dispense based on patient and insurance preference. Use to check BS bid   chlorhexidine (PERIDEX) 0.12 % solution Use as directed 15 mLs in the mouth or throat in the morning, at noon, and at bedtime.   Cholecalciferol (VITAMIN D) 50 MCG (2000 UT) CAPS Take 2 capsules by mouth daily.   clopidogrel (PLAVIX) 75 MG tablet Take 1 tablet (75 mg total) by mouth daily. HOLD FOR NOW AFTER HOSPITAL DISCHARGE UNTIL YOUR PRIMARY DOCTOR CAN CHECK A BLOOD COUNT   Cyanocobalamin (VITAMIN B 12 PO) Take 1,000 mcg by mouth daily. '1000mg'$  daily   diphenoxylate-atropine (LOMOTIL) 2.5-0.025 MG tablet Take 1 tablet by mouth 4 (four) times daily as needed for diarrhea or loose stools.    FARXIGA 10 MG TABS tablet TAKE 1 TABLET BY MOUTH DAILY  BEFORE BREAKFAST   fexofenadine (ALLEGRA) 180 MG tablet Take 180 mg by mouth daily.   lisinopril (ZESTRIL) 20 MG tablet Take 0.5 tablets (10 mg total) by mouth daily. HALF TABLET (10 MG) FOR NOW, CAN INCREASE TO 1 TABLET (20 MG) PO DAILY IF BLOOD PRESSURE HIGHER THAN 140/90   loperamide (IMODIUM A-D) 2 MG tablet Take 2 mg by mouth as needed for diarrhea or loose stools.   meclizine (  ANTIVERT) 12.5 MG tablet TAKE 1 TABLET(12.5 MG) BY MOUTH TWICE DAILY AS NEEDED FOR DIZZINESS   metoprolol tartrate (LOPRESSOR) 50 MG tablet Take 50 mg by mouth 2 (two) times daily.   Multiple Vitamin tablet Take 1 tablet by mouth daily.    ondansetron (ZOFRAN) 4 MG tablet Take 1-2 tablets (4-8 mg total) by mouth every 6 (six) hours as needed for nausea or vomiting.   ondansetron (ZOFRAN-ODT) 4 MG disintegrating tablet Take 1 tablet (4 mg total) by mouth every 8 (eight) hours as needed for nausea or vomiting.   sertraline (ZOLOFT) 50 MG tablet TAKE 1 TABLET BY MOUTH DAILY       03/25/2022   10:51 AM 12/07/2021   10:01 AM 08/06/2021    2:57 PM 04/20/2021   10:54 AM  GAD 7 : Generalized Anxiety Score  Nervous, Anxious, on Edge 0 0 0 0  Control/stop worrying 0 0 0 0  Worry too much - different things 0 0 0 0  Trouble relaxing 0 0 0 0  Restless 0 0 0 0  Easily annoyed or irritable 0 0 0 0  Afraid - awful might happen 0 0 0 0  Total GAD 7 Score 0 0 0 0  Anxiety Difficulty Not difficult at all Not difficult at all Not difficult at all        03/25/2022   10:51 AM 12/07/2021   10:01 AM 11/14/2021    2:07 PM  Depression screen PHQ 2/9  Decreased Interest 0 0 0  Down, Depressed, Hopeless 0 0 0  PHQ - 2 Score 0 0 0  Altered sleeping 0 0 0  Tired, decreased energy 0 0 0  Change in appetite 0 0 0  Feeling bad or failure about yourself  0 0 0  Trouble concentrating 0 0 0  Moving slowly or fidgety/restless 0 0 0  Suicidal thoughts 0 0 0  PHQ-9 Score 0 0 0   Difficult doing work/chores Not difficult at all Not difficult at all Not difficult at all    BP Readings from Last 3 Encounters:  03/25/22 130/72  03/16/22 (!) 161/69  12/07/21 112/78    Physical Exam Vitals and nursing note reviewed.  Constitutional:      General: She is not in acute distress.    Appearance: Normal appearance. She is well-developed.  HENT:     Head: Normocephalic and atraumatic.  Cardiovascular:     Rate and Rhythm: Normal rate and regular rhythm.     Heart sounds: No murmur heard. Pulmonary:     Effort: Pulmonary effort is normal. No respiratory distress.     Breath sounds: No wheezing or rhonchi.  Abdominal:     General: Abdomen is flat.     Palpations: Abdomen is soft.     Tenderness: There is no abdominal tenderness. There is no guarding or rebound.  Musculoskeletal:     Cervical back: Normal range of motion.     Right lower leg: No edema.     Left lower leg: No edema.  Lymphadenopathy:     Cervical: No cervical adenopathy.  Skin:    General: Skin is warm and dry.     Capillary Refill: Capillary refill takes less than 2 seconds.     Findings: No rash.  Neurological:     General: No focal deficit present.     Mental Status: She is alert and oriented to person, place, and time.  Psychiatric:        Mood and  Affect: Mood normal.        Behavior: Behavior normal.     Wt Readings from Last 3 Encounters:  03/25/22 189 lb (85.7 kg)  03/14/22 184 lb (83.5 kg)  12/07/21 189 lb (85.7 kg)    BP 130/72   Pulse 64   Ht '5\' 2"'$  (1.575 m)   Wt 189 lb (85.7 kg)   LMP  (LMP Unknown)   SpO2 98%   BMI 34.57 kg/m   Assessment and Plan: Problem List Items Addressed This Visit       Cardiovascular and Mediastinum   Coronary artery disease involving native coronary artery of native heart with angina pectoris ([place])    Stable without chest pain or shortness of breath Continue current medications, Plavix         Endocrine   Type II diabetes  mellitus with complication (HCC) (Chronic)    Clinically stable without s/s of hypoglycemia. Tolerating farxiga well without side effects or other concerns. Lab Results  Component Value Date   HGBA1C 8.0 (H) 12/07/2021  Continue diet and exercise as well Check A1C today       Relevant Orders   Hemoglobin A1c     Genitourinary   Anemia due to stage 3b chronic kidney disease (HCC) (Chronic)    Plavix held while hospitalized. Will recheck Hgb and resume if hgb > 10      Relevant Orders   CBC with Differential/Platelet     Other   Abdominal pain - Primary    resolved with continued intermittent diarrhea may take imodium as needed agree with GI consult       Major depressive disorder with single episode, in partial remission (HCC) (Chronic)    Clinically stable on current regimen with good control of symptoms, No SI or HI. No change in management at this time.       Other Visit Diagnoses     AKI (acute kidney injury) ([place])       GFR improving at discharge will repeat labs today   Relevant Orders   Basic metabolic panel        Partially dictated using Dragon software. Any errors are unintentional.  Halina Maidens, MD [facility]  03/25/2022

## 2022-03-25 NOTE — Assessment & Plan Note (Signed)
Stable without chest pain or shortness of breath Continue current medications, Plavix

## 2022-03-25 NOTE — Assessment & Plan Note (Signed)
Plavix held while hospitalized. Will recheck Hgb and resume if hgb > 10

## 2022-03-25 NOTE — Assessment & Plan Note (Signed)
Clinically stable without s/s of hypoglycemia. Tolerating farxiga well without side effects or other concerns. Lab Results  Component Value Date   HGBA1C 8.0 (H) 12/07/2021  Continue diet and exercise as well Check A1C today

## 2022-03-25 NOTE — Assessment & Plan Note (Signed)
Clinically stable on current regimen with good control of symptoms, No SI or HI. No change in management at this time.

## 2022-03-26 LAB — CBC WITH DIFFERENTIAL/PLATELET
Basophils Absolute: 0.1 10*3/uL (ref 0.0–0.2)
Basos: 1 %
EOS (ABSOLUTE): 0.3 10*3/uL (ref 0.0–0.4)
Eos: 3 %
Hematocrit: 37 % (ref 34.0–46.6)
Hemoglobin: 12.7 g/dL (ref 11.1–15.9)
Immature Grans (Abs): 0 10*3/uL (ref 0.0–0.1)
Immature Granulocytes: 0 %
Lymphocytes Absolute: 1.8 10*3/uL (ref 0.7–3.1)
Lymphs: 21 %
MCH: 30.4 pg (ref 26.6–33.0)
MCHC: 34.3 g/dL (ref 31.5–35.7)
MCV: 89 fL (ref 79–97)
Monocytes Absolute: 0.6 10*3/uL (ref 0.1–0.9)
Monocytes: 8 %
Neutrophils Absolute: 5.5 10*3/uL (ref 1.4–7.0)
Neutrophils: 67 %
Platelets: 311 10*3/uL (ref 150–450)
RBC: 4.18 x10E6/uL (ref 3.77–5.28)
RDW: 11.8 % (ref 11.7–15.4)
WBC: 8.3 10*3/uL (ref 3.4–10.8)

## 2022-03-26 LAB — BASIC METABOLIC PANEL
BUN/Creatinine Ratio: 15 (ref 12–28)
BUN: 27 mg/dL (ref 8–27)
CO2: 18 mmol/L — ABNORMAL LOW (ref 20–29)
Calcium: 9.6 mg/dL (ref 8.7–10.3)
Chloride: 101 mmol/L (ref 96–106)
Creatinine, Ser: 1.8 mg/dL — ABNORMAL HIGH (ref 0.57–1.00)
Glucose: 153 mg/dL — ABNORMAL HIGH (ref 70–99)
Potassium: 5.4 mmol/L — ABNORMAL HIGH (ref 3.5–5.2)
Sodium: 136 mmol/L (ref 134–144)
eGFR: 30 mL/min/{1.73_m2} — ABNORMAL LOW (ref >59)

## 2022-03-26 LAB — HEMOGLOBIN A1C
Est. average glucose Bld gHb Est-mCnc: 180 mg/dL
Hgb A1c MFr Bld: 7.9 % — ABNORMAL HIGH (ref 4.8–5.6)

## 2022-03-27 NOTE — Progress Notes (Signed)
Called pt let her know that I reached out to our referral team to see what was going on with her referral for GI. Pt verbalized understanding.  KP

## 2022-04-02 ENCOUNTER — Ambulatory Visit: Payer: Medicare Other | Admitting: Internal Medicine

## 2022-04-29 ENCOUNTER — Other Ambulatory Visit: Payer: Self-pay | Admitting: Internal Medicine

## 2022-04-29 DIAGNOSIS — I10 Essential (primary) hypertension: Secondary | ICD-10-CM

## 2022-04-30 NOTE — Telephone Encounter (Signed)
Requested medication (s) are due for refill today: yes  Requested medication (s) are on the active medication list: yes  Last refill:  03/16/22 #90 0 refills  Future visit scheduled: yes in 3 months  Notes to clinic:  last ordered by Sunnie Nielsen, DO 03/16/22 . Do you want to order Rx?     Requested Prescriptions  Pending Prescriptions Disp Refills   lisinopril (ZESTRIL) 20 MG tablet [Pharmacy Med Name: Lisinopril 20 MG Oral Tablet] 90 tablet 3    Sig: TAKE 1 TABLET BY MOUTH DAILY     Cardiovascular:  ACE Inhibitors Failed - 04/29/2022 11:14 AM      Failed - Cr in normal range and within 180 days    Creatinine  Date Value Ref Range Status  04/27/2013 1.28 0.60 - 1.30 mg/dL Final   Creatinine, Ser  Date Value Ref Range Status  03/25/2022 1.80 (H) 0.57 - 1.00 mg/dL Final   Creatinine, Urine  Date Value Ref Range Status  04/09/2021 243  Final         Failed - K in normal range and within 180 days    Potassium  Date Value Ref Range Status  03/25/2022 5.4 (H) 3.5 - 5.2 mmol/L Final  04/27/2013 4.2 3.5 - 5.1 mmol/L Final         Passed - Patient is not pregnant      Passed - Last BP in normal range    BP Readings from Last 1 Encounters:  03/25/22 130/72         Passed - Valid encounter within last 6 months    Recent Outpatient Visits           1 month ago Generalized abdominal pain   [place] [facility], Nyoka Cowden, MD   4 months ago Annual physical exam   [facility] at [facility], Nyoka Cowden, MD   8 months ago Essential (primary) hypertension   [place] [facility] at [facility], Nyoka Cowden, MD   1 year ago COVID-19 virus infection   [facility] at [facility], Nyoka Cowden, MD   1 year ago Type II diabetes mellitus with complication [facility])   [place] [facility] at [facility], Nyoka Cowden, MD       Future Appointments             In 3 months Judithann Graves, Nyoka Cowden, MD [facility] at [facility], [facility]

## 2022-05-01 NOTE — Telephone Encounter (Signed)
Please review.  KP

## 2022-05-11 ENCOUNTER — Other Ambulatory Visit (INDEPENDENT_AMBULATORY_CARE_PROVIDER_SITE_OTHER): Payer: Self-pay | Admitting: Vascular Surgery

## 2022-05-17 ENCOUNTER — Other Ambulatory Visit (INDEPENDENT_AMBULATORY_CARE_PROVIDER_SITE_OTHER): Payer: Self-pay | Admitting: Vascular Surgery

## 2022-05-17 DIAGNOSIS — I70213 Atherosclerosis of native arteries of extremities with intermittent claudication, bilateral legs: Secondary | ICD-10-CM

## 2022-05-19 NOTE — Progress Notes (Unsigned)
MRN : [id number]  Misty Villarreal is a 72 y.o. (15-Mar-1950) female who presents with chief complaint of check circulation.  History of Present Illness:   The patient returns to the office for followup and review status post angiogram with intervention on 07/21/2018.    Procedure(s) Performed:             1.  Introduction catheter into left lower extremity 3rd order catheter placement              2.  Contrast injection left lower extremity for distal runoff             3.  Crosser atherectomy of the left SFA and popliteal arteries             4.   Percutaneous transluminal angioplasty left superficial femoral artery and popliteal             5.  Star close closure right common femoral arteriotomy   The patient notes her lower extremity symptoms are stable.  She recently went to a concert and was able to walk to the venue without problem, she notes she did have some problems walking back but it was still OK.  No interval shortening of the patient's claudication distance or rest pain symptoms. No new ulcers or wounds have occurred since the last visit.     There have been no significant changes to the patient's overall health care.   The patient denies amaurosis fugax or recent TIA symptoms. There are no recent neurological changes noted. The patient denies history of DVT, PE or superficial thrombophlebitis. The patient denies recent episodes of angina or shortness of breath.    ABI's Rt=0.99 and Lt=0.0.71 (previous ABI's Rt=1.01 and Lt=0.67).   Previous duplex ultrasound of the arterial system bilaterally shows the SFA intervention site on the right is patent and the left is occluded. No change compared to the last study.      No outpatient medications have been marked as taking for the 05/20/22 encounter (Appointment) with Gilda Crease, Latina Craver, MD.    Past Medical History:  Diagnosis Date   Abnormal glandular Papanicolaou smear of cervix 09/01/2014   Normal Pap  2012    Anemia    Chronic kidney disease    stage 3   Depression    Diabetes mellitus without complication (HCC)    DVT (deep venous thrombosis) (HCC)    Hyperlipidemia    Hypertension    Peripheral vascular disease (HCC)    Peripheral vascular disease (HCC)    Slurred speech 12/03/2016    Past Surgical History:  Procedure Laterality Date   ANGIOPLASTY / STENTING FEMORAL Left 2014, 2013   BRAIN MENINGIOMA EXCISION  1988   BREAST BIOPSY Right 1989   benign   CARPAL TUNNEL RELEASE Left 1980   CATARACT EXTRACTION W/PHACO Right 09/02/2019   Procedure: CATARACT EXTRACTION PHACO AND INTRAOCULAR LENS PLACEMENT (IOC);  Surgeon: Nevada Crane, MD;  Location: [facility] ORS;  Service: Ophthalmology;  Laterality: Right;  [place] 00:27.3 CDE 1.92 AP% Fluid Pack lot # [id number]   CATARACT EXTRACTION W/PHACO Left 10/18/2019   Procedure: CATARACT EXTRACTION PHACO AND INTRAOCULAR LENS PLACEMENT (IOC) LEFT DIABETIC 2.55  00:28.5;  Surgeon: Nevada Crane, MD;  Location: [facility];  Service: Ophthalmology;  Laterality: Left;  Diabetic - oral meds   CESAREAN SECTION     CHOLECYSTECTOMY  1987   COLONOSCOPY  WITH PROPOFOL N/A 05/08/2017   Procedure: COLONOSCOPY WITH PROPOFOL;  Surgeon: Wyline Mood, MD;  Location: [facility] ENDOSCOPY;  Service: Gastroenterology;  Laterality: N/A;   KNEE ARTHROPLASTY Right 2009   LOWER EXTREMITY ANGIOGRAPHY Left 07/21/2018   Procedure: LOWER EXTREMITY ANGIOGRAPHY;  Surgeon: Renford Dills, MD;  Location: [facility] INVASIVE CV LAB;  Service: Cardiovascular;  Laterality: Left;    Social History Social History   Tobacco Use   Smoking status: Former    Packs/day: 0.50    Years: 46.00    Additional pack years: 0.00    Total pack years: 23.00    Types: Cigarettes    Quit date: 04/05/2013    Years since quitting: 9.1   Smokeless tobacco: Never  Vaping Use   Vaping Use: Never used  Substance Use Topics   Alcohol use: Yes    Alcohol/week: 2.0 standard drinks of  alcohol    Types: 2 Standard drinks or equivalent per week   Drug use: Never    Family History Family History  Problem Relation Age of Onset   Diabetes Mother    Heart failure Father    Diabetes Father    Breast cancer Neg Hx     Allergies  Allergen Reactions   Augmentin [Amoxicillin-Pot Clavulanate] Diarrhea   Levaquin [Levofloxacin] Nausea And Vomiting and Other (See Comments)    Ankle pain   Tetracycline     Other reaction(s): emesis   Cefaclor Rash   Cephalexin Rash   Rosuvastatin Diarrhea   Sulfa Antibiotics Rash    Other reaction(s): emesis     REVIEW OF SYSTEMS (Negative unless checked)  Constitutional: [] Weight loss  [] Fever  [] Chills Cardiac: [] Chest pain   [] Chest pressure   [] Palpitations   [] Shortness of breath when laying flat   [] Shortness of breath with exertion. Vascular:  [x] Pain in legs with walking   [] Pain in legs at rest  [] History of DVT   [] Phlebitis   [] Swelling in legs   [] Varicose veins   [] Non-healing ulcers Pulmonary:   [] Uses home oxygen   [] Productive cough   [] Hemoptysis   [] Wheeze  [] COPD   [] Asthma Neurologic:  [] Dizziness   [] Seizures   [] History of stroke   [] History of TIA  [] Aphasia   [] Vissual changes   [] Weakness or numbness in arm   [] Weakness or numbness in leg Musculoskeletal:   [] Joint swelling   [] Joint pain   [] Low back pain Hematologic:  [] Easy bruising  [] Easy bleeding   [] Hypercoagulable state   [] Anemic Gastrointestinal:  [] Diarrhea   [] Vomiting  [] Gastroesophageal reflux/heartburn   [] Difficulty swallowing. Genitourinary:  [] Chronic kidney disease   [] Difficult urination  [] Frequent urination   [] Blood in urine Skin:  [] Rashes   [] Ulcers  Psychological:  [] History of anxiety   []  History of major depression.  Physical Examination  There were no vitals filed for this visit. There is no height or weight on file to calculate BMI. Gen: WD/WN, NAD Head: [place]/AT, No temporalis wasting.  Ear/Nose/Throat: Hearing grossly intact,  nares w/o erythema or drainage Eyes: PER, EOMI, sclera nonicteric.  Neck: Supple, no masses.  No bruit or JVD.  Pulmonary:  Good air movement, no audible wheezing, no use of accessory muscles.  Cardiac: RRR, normal S1, S2, no Murmurs. Vascular:  mild trophic changes, no open wounds Vessel Right Left  Radial Palpable Palpable  PT Not Palpable Not Palpable  DP Not Palpable Not Palpable  Gastrointestinal: soft, non-distended. No guarding/no peritoneal signs.  Musculoskeletal: M/S 5/5 throughout.  No visible  deformity.  Neurologic: CN 2-12 intact. Pain and light touch intact in extremities.  Symmetrical.  Speech is fluent. Motor exam as listed above. Psychiatric: Judgment intact, Mood & affect appropriate for pt's clinical situation. Dermatologic: No rashes or ulcers noted.  No changes consistent with cellulitis.   CBC Lab Results  Component Value Date   WBC 8.3 03/25/2022   HGB 12.7 03/25/2022   HCT 37.0 03/25/2022   MCV 89 03/25/2022   PLT 311 03/25/2022    BMET    Component Value Date/Time   NA 136 03/25/2022 1115   NA 139 04/27/2013 0713   K 5.4 (H) 03/25/2022 1115   K 4.2 04/27/2013 0713   CL 101 03/25/2022 1115   CL 108 (H) 04/27/2013 0713   CO2 18 (L) 03/25/2022 1115   CO2 25 04/27/2013 0713   GLUCOSE 153 (H) 03/25/2022 1115   GLUCOSE 163 (H) 03/16/2022 0555   GLUCOSE 137 (H) 04/27/2013 0713   BUN 27 03/25/2022 1115   BUN 21 (H) 04/27/2013 0713   CREATININE 1.80 (H) 03/25/2022 1115   CREATININE 1.28 04/27/2013 0713   CALCIUM 9.6 03/25/2022 1115   CALCIUM 9.3 04/27/2013 0713   GFRNONAA 37 (L) 03/16/2022 0555   GFRNONAA 44 (L) 04/27/2013 0713   GFRAA 43 (L) 06/28/2019 1041   GFRAA 52 (L) 04/27/2013 0713   CrCl cannot be calculated (Patient's most recent lab result is older than the maximum 21 days allowed.).  COAG Lab Results  Component Value Date   INR 1.0 03/14/2022   INR 0.85 12/03/2016    Radiology No results found.   Assessment/Plan There are no  diagnoses linked to this encounter.   Levora Dredge, MD  05/19/2022 12:03 PM

## 2022-05-20 ENCOUNTER — Ambulatory Visit (INDEPENDENT_AMBULATORY_CARE_PROVIDER_SITE_OTHER): Payer: Medicare Other

## 2022-05-20 ENCOUNTER — Encounter (INDEPENDENT_AMBULATORY_CARE_PROVIDER_SITE_OTHER): Payer: Self-pay | Admitting: Vascular Surgery

## 2022-05-20 ENCOUNTER — Ambulatory Visit (INDEPENDENT_AMBULATORY_CARE_PROVIDER_SITE_OTHER): Payer: Medicare Other | Admitting: Vascular Surgery

## 2022-05-20 VITALS — BP 127/67 | HR 69 | Resp 16 | Ht 63.0 in | Wt 189.8 lb

## 2022-05-20 DIAGNOSIS — E1129 Type 2 diabetes mellitus with other diabetic kidney complication: Secondary | ICD-10-CM

## 2022-05-20 DIAGNOSIS — I70213 Atherosclerosis of native arteries of extremities with intermittent claudication, bilateral legs: Secondary | ICD-10-CM

## 2022-05-20 DIAGNOSIS — I10 Essential (primary) hypertension: Secondary | ICD-10-CM

## 2022-05-20 DIAGNOSIS — I890 Lymphedema, not elsewhere classified: Secondary | ICD-10-CM | POA: Diagnosis not present

## 2022-05-20 DIAGNOSIS — Z794 Long term (current) use of insulin: Secondary | ICD-10-CM

## 2022-05-20 DIAGNOSIS — E782 Mixed hyperlipidemia: Secondary | ICD-10-CM

## 2022-05-21 LAB — VAS US ABI WITH/WO TBI
Left ABI: 0.62
Right ABI: 0.85

## 2022-05-30 ENCOUNTER — Inpatient Hospital Stay: Payer: Medicare Other | Attending: Oncology

## 2022-05-30 DIAGNOSIS — E538 Deficiency of other specified B group vitamins: Secondary | ICD-10-CM | POA: Insufficient documentation

## 2022-05-30 DIAGNOSIS — D801 Nonfamilial hypogammaglobulinemia: Secondary | ICD-10-CM | POA: Insufficient documentation

## 2022-05-30 DIAGNOSIS — Z87891 Personal history of nicotine dependence: Secondary | ICD-10-CM | POA: Insufficient documentation

## 2022-05-30 DIAGNOSIS — D631 Anemia in chronic kidney disease: Secondary | ICD-10-CM | POA: Insufficient documentation

## 2022-05-30 DIAGNOSIS — E1122 Type 2 diabetes mellitus with diabetic chronic kidney disease: Secondary | ICD-10-CM | POA: Insufficient documentation

## 2022-05-30 DIAGNOSIS — N1832 Chronic kidney disease, stage 3b: Secondary | ICD-10-CM | POA: Insufficient documentation

## 2022-05-30 LAB — IRON AND TIBC
Iron: 79 ug/dL (ref 28–170)
Saturation Ratios: 22 % (ref 10.4–31.8)
TIBC: 358 ug/dL (ref 250–450)
UIBC: 279 ug/dL

## 2022-05-30 LAB — COMPREHENSIVE METABOLIC PANEL
ALT: 17 U/L (ref 0–44)
AST: 23 U/L (ref 15–41)
Albumin: 4.2 g/dL (ref 3.5–5.0)
Alkaline Phosphatase: 80 U/L (ref 38–126)
Anion gap: 10 (ref 5–15)
BUN: 32 mg/dL — ABNORMAL HIGH (ref 8–23)
CO2: 20 mmol/L — ABNORMAL LOW (ref 22–32)
Calcium: 9.5 mg/dL (ref 8.9–10.3)
Chloride: 105 mmol/L (ref 98–111)
Creatinine, Ser: 1.59 mg/dL — ABNORMAL HIGH (ref 0.44–1.00)
GFR, Estimated: 34 mL/min — ABNORMAL LOW (ref >60)
Glucose, Bld: 166 mg/dL — ABNORMAL HIGH (ref 70–99)
Potassium: 5.2 mmol/L — ABNORMAL HIGH (ref 3.5–5.1)
Sodium: 135 mmol/L (ref 135–145)
Total Bilirubin: 0.8 mg/dL (ref 0.3–1.2)
Total Protein: 7.5 g/dL (ref 6.5–8.1)

## 2022-05-30 LAB — CBC WITH DIFFERENTIAL/PLATELET
Abs Immature Granulocytes: 0.02 10*3/uL (ref 0.00–0.07)
Basophils Absolute: 0.1 10*3/uL (ref 0.0–0.1)
Basophils Relative: 1 %
Eosinophils Absolute: 0.3 10*3/uL (ref 0.0–0.5)
Eosinophils Relative: 4 %
HCT: 36.9 % (ref 36.0–46.0)
Hemoglobin: 12.2 g/dL (ref 12.0–15.0)
Immature Granulocytes: 0 %
Lymphocytes Relative: 20 %
Lymphs Abs: 1.4 10*3/uL (ref 0.7–4.0)
MCH: 29.5 pg (ref 26.0–34.0)
MCHC: 33.1 g/dL (ref 30.0–36.0)
MCV: 89.3 fL (ref 80.0–100.0)
Monocytes Absolute: 0.6 10*3/uL (ref 0.1–1.0)
Monocytes Relative: 8 %
Neutro Abs: 4.7 10*3/uL (ref 1.7–7.7)
Neutrophils Relative %: 67 %
Platelets: 300 10*3/uL (ref 150–400)
RBC: 4.13 MIL/uL (ref 3.87–5.11)
RDW: 12.4 % (ref 11.5–15.5)
WBC: 7.1 10*3/uL (ref 4.0–10.5)
nRBC: 0 % (ref 0.0–0.2)

## 2022-05-30 LAB — RETIC PANEL
Immature Retic Fract: 8 % (ref 2.3–15.9)
RBC.: 4.04 MIL/uL (ref 3.87–5.11)
Retic Count, Absolute: 74.7 10*3/uL (ref 19.0–186.0)
Retic Ct Pct: 1.9 % (ref 0.4–3.1)
Reticulocyte Hemoglobin: 32.7 pg (ref >27.9)

## 2022-05-30 LAB — FERRITIN: Ferritin: 55 ng/mL (ref 11–307)

## 2022-05-31 LAB — VITAMIN B12: Vitamin B-12: 413 pg/mL (ref 180–914)

## 2022-05-31 MED FILL — Iron Sucrose Inj 20 MG/ML (Fe Equiv): INTRAVENOUS | Qty: 10 | Status: AC

## 2022-06-03 ENCOUNTER — Inpatient Hospital Stay: Payer: Medicare Other | Admitting: Oncology

## 2022-06-03 ENCOUNTER — Encounter: Payer: Self-pay | Admitting: Oncology

## 2022-06-03 ENCOUNTER — Inpatient Hospital Stay: Payer: Medicare Other

## 2022-06-03 VITALS — BP 96/69 | HR 65 | Temp 97.1°F | Resp 18 | Wt 188.2 lb

## 2022-06-03 DIAGNOSIS — N183 Chronic kidney disease, stage 3 unspecified: Secondary | ICD-10-CM | POA: Insufficient documentation

## 2022-06-03 DIAGNOSIS — D631 Anemia in chronic kidney disease: Secondary | ICD-10-CM | POA: Diagnosis not present

## 2022-06-03 DIAGNOSIS — N1832 Chronic kidney disease, stage 3b: Secondary | ICD-10-CM

## 2022-06-03 DIAGNOSIS — E538 Deficiency of other specified B group vitamins: Secondary | ICD-10-CM | POA: Diagnosis not present

## 2022-06-03 DIAGNOSIS — E1122 Type 2 diabetes mellitus with diabetic chronic kidney disease: Secondary | ICD-10-CM | POA: Diagnosis not present

## 2022-06-03 NOTE — Assessment & Plan Note (Addendum)
continue Vitamin B12 daily

## 2022-06-03 NOTE — Progress Notes (Signed)
Hematology/Oncology Progress note [phone] [phone]         ASSESSMENT & PLAN:   Anemia due to stage 3b chronic kidney disease (HCC) #Anemia due to chronic kidney disease. Labs reviewed and discussed with patient. Lab Results  Component Value Date   HGB 12.2 05/30/2022   TIBC 358 05/30/2022   IRONPCTSAT 22 05/30/2022   FERRITIN 55 05/30/2022     Hemoglobin is normal. Iron panel is stable.  Hold off additional IV Venofer treatment at this point.  Observation.  B12 deficiency continue Vitamin B12 daily  CKD (chronic kidney disease) stage 3, GFR 30-59 ml/min (HCC) Encourage oral hydration and avoid nephrotoxins.  Hyperkalemia, recommend pt to follow up with nephrology. Avoid K rich food   Orders Placed This Encounter  Procedures   CBC with Differential (Cancer Center Only)    Standing Status:   Future    Standing Expiration Date:   06/03/2023   Iron and TIBC    Standing Status:   Future    Standing Expiration Date:   06/03/2023   Ferritin    Standing Status:   Future    Standing Expiration Date:   06/03/2023   Vitamin B12    Standing Status:   Future    Standing Expiration Date:   06/03/2023   Retic Panel    Standing Status:   Future    Standing Expiration Date:   06/03/2023   Follow up in 12 months.  All questions were answered. The patient knows to call the clinic with any problems, questions or concerns.  Rickard Patience, MD, PhD [facility] Hematology Oncology 06/03/2022    Chief Complaint: Misty Villarreal is a 72 y.o. female presents to follow up for anemia in CKD, hypogammaglobulinemia   PERTINENT HEMATOLOGY HISTORY  Patient previously followed up by Dr.Corcoran, patient switched care to me on 07/27/20 Extensive medical record review was performed by me  #Anemia in chronic kidney disease. 11/11/2018  protein electrophoresis showed no M spike  Patient received IV Venofer treatments  #Vitamin B12 deficiency 11/11/2018,  vitamin B12 was 104.   Patient is on oral vitamin B12 supplementation. Anti-parietal antibody and intrinsic factor antibody were normal on 12/14/2018.   #stage IIIB chronic kidney disease felt secondary to diabetes.   11/18/2018 UPEP 24 h negative for M protein. Serum light chain ratio normal.  Patient follows up with nephrology Dr. Cherylann Ratel.   05/08/2017 colonoscopy revealed diverticulosis and non-bleeding internal hemorrhoids.  #Former smoker, Low dose chest CT on 11/16/2019 revealed Lung-RADS 2S, benign appearance or behavior. There was mild diffuse bronchial wall thickening with very mild centrilobular and paraseptal emphysema; imaging findings suggestive of underlying COPD. There were calcifications of the aortic valve. Echocardiographic correlation for evaluation of potential valvular dysfunction may be warranted if clinically indicated.   INTERVAL HISTORY Misty Villarreal is a 72 y.o. female who has above history reviewed by me today presents for follow up visit for anemia   Chronic IBS symptoms. She currently takes vitamin B12 1000 MCG daily  Past Medical History:  Diagnosis Date   Abnormal glandular Papanicolaou smear of cervix 09/01/2014   Normal Pap 2012    Anemia    Chronic kidney disease    stage 3   Depression    Diabetes mellitus without complication (HCC)    DVT (deep venous thrombosis) (HCC)    Hyperlipidemia    Hypertension    Peripheral vascular disease (HCC)    Peripheral vascular disease (HCC)    Slurred  speech 12/03/2016    Past Surgical History:  Procedure Laterality Date   ANGIOPLASTY / STENTING FEMORAL Left 06-23-12, 06-24-2011   BRAIN MENINGIOMA EXCISION  1988   BREAST BIOPSY Right 1989   benign   CARPAL TUNNEL RELEASE Left 1980   CATARACT EXTRACTION W/PHACO Right 09/02/2019   Procedure: CATARACT EXTRACTION PHACO AND INTRAOCULAR LENS PLACEMENT (IOC);  Surgeon: Nevada Crane, MD;  Location: [facility] ORS;  Service: Ophthalmology;  Laterality: Right;  [place]  00:27.3 CDE 1.92 AP% Fluid Pack lot # [id number]   CATARACT EXTRACTION W/PHACO Left 10/18/2019   Procedure: CATARACT EXTRACTION PHACO AND INTRAOCULAR LENS PLACEMENT (IOC) LEFT DIABETIC 2.55  00:28.5;  Surgeon: Nevada Crane, MD;  Location: [facility];  Service: Ophthalmology;  Laterality: Left;  Diabetic - oral meds   CESAREAN SECTION     CHOLECYSTECTOMY  1987   COLONOSCOPY WITH PROPOFOL N/A 05/08/2017   Procedure: COLONOSCOPY WITH PROPOFOL;  Surgeon: Wyline Mood, MD;  Location: [facility] ENDOSCOPY;  Service: Gastroenterology;  Laterality: N/A;   KNEE ARTHROPLASTY Right 06-24-07   LOWER EXTREMITY ANGIOGRAPHY Left 07/21/2018   Procedure: LOWER EXTREMITY ANGIOGRAPHY;  Surgeon: Renford Dills, MD;  Location: [facility] INVASIVE CV LAB;  Service: Cardiovascular;  Laterality: Left;    Family History  Problem Relation Age of Onset   Diabetes Mother    Heart failure Father    Diabetes Father    Breast cancer Neg Hx     Social History:  reports that she quit smoking about 9 years ago. Her smoking use included cigarettes. She has a 23.00 pack-year smoking history. She has never used smokeless tobacco. She reports current alcohol use of about 2.0 standard drinks of alcohol per week. She reports that she does not use drugs. She previously smoked a pack of cigarettes a day for 40 years.  She drinks red wine occasionally.    Her husband, Jonny Ruiz, died in Jun 24, 2018.    Allergies:  Allergies  Allergen Reactions   Augmentin [Amoxicillin-Pot Clavulanate] Diarrhea   Levaquin [Levofloxacin] Nausea And Vomiting and Other (See Comments)    Ankle pain   Tetracycline     Other reaction(s): emesis   Cefaclor Rash   Cephalexin Rash   Rosuvastatin Diarrhea   Sulfa Antibiotics Rash    Other reaction(s): emesis    Current Medications: Current Outpatient Medications  Medication Sig Dispense Refill   albuterol (VENTOLIN HFA) 108 (90 Base) MCG/ACT inhaler Inhale 2 puffs into the lungs every 6 (six) hours as  needed. 54 g 0   aspirin 81 MG chewable tablet Chew 81 mg by mouth daily.      atorvastatin (LIPITOR) 40 MG tablet TAKE 1 TABLET DAILY 90 tablet 3   blood glucose meter kit and supplies KIT Dispense based on patient and insurance preference. Use to check BS bid 1 each 0   chlorhexidine (PERIDEX) 0.12 % solution Use as directed 15 mLs in the mouth or throat in the morning, at noon, and at bedtime.     Cholecalciferol (VITAMIN D) 50 MCG (2000 UT) CAPS Take 2 capsules by mouth daily.     clopidogrel (PLAVIX) 75 MG tablet TAKE 1 TABLET BY MOUTH DAILY 90 tablet 3   Cyanocobalamin (VITAMIN B 12 PO) Take 1,000 mcg by mouth daily. 1000mg  daily     diphenoxylate-atropine (LOMOTIL) 2.5-0.025 MG tablet Take 1 tablet by mouth 4 (four) times daily as needed for diarrhea or loose stools. 15 tablet 0   FARXIGA 10 MG TABS tablet TAKE 1 TABLET BY MOUTH  DAILY  BEFORE BREAKFAST 90 tablet 3   fexofenadine (ALLEGRA) 180 MG tablet Take 180 mg by mouth daily.     lisinopril (ZESTRIL) 20 MG tablet TAKE 1 TABLET BY MOUTH DAILY 90 tablet 3   loperamide (IMODIUM A-D) 2 MG tablet Take 2 mg by mouth as needed for diarrhea or loose stools.     meclizine (ANTIVERT) 12.5 MG tablet TAKE 1 TABLET(12.5 MG) BY MOUTH TWICE DAILY AS NEEDED FOR DIZZINESS 60 tablet 5   metoprolol tartrate (LOPRESSOR) 50 MG tablet Take 50 mg by mouth 2 (two) times daily.     Multiple Vitamin tablet Take 1 tablet by mouth daily.      ondansetron (ZOFRAN) 4 MG tablet Take 1-2 tablets (4-8 mg total) by mouth every 6 (six) hours as needed for nausea or vomiting. 20 tablet 0   ondansetron (ZOFRAN-ODT) 4 MG disintegrating tablet Take 1 tablet (4 mg total) by mouth every 8 (eight) hours as needed for nausea or vomiting. 12 tablet 0   sertraline (ZOLOFT) 50 MG tablet TAKE 1 TABLET BY MOUTH DAILY 90 tablet 1   No current facility-administered medications for this visit.    Review of Systems  Constitutional:  Positive for malaise/fatigue. Negative for chills,  diaphoresis, fever and weight loss.       Feels "better" overall.  HENT:  Negative for congestion, ear discharge, ear pain, hearing loss, nosebleeds, sinus pain, sore throat and tinnitus.   Eyes:  Negative for blurred vision, double vision and photophobia.  Respiratory:  Negative for cough, hemoptysis, sputum production and shortness of breath.   Cardiovascular:  Negative for chest pain, palpitations, orthopnea and leg swelling.         On Plavix for peripheral vascular disease  Gastrointestinal:  Negative for abdominal pain, blood in stool, constipation, diarrhea (on Imodium), heartburn, melena, nausea and vomiting.       IBS.  No prior EGD.  Genitourinary:  Negative for dysuria, frequency, hematuria and urgency.  Musculoskeletal:  Positive for myalgias (leg cramps). Negative for back pain, joint pain and neck pain.       Chronic leg pain.  Skin:  Negative for itching and rash.  Neurological: Negative.  Negative for dizziness, tingling, tremors, sensory change, speech change, focal weakness, weakness and headaches.  Endo/Heme/Allergies:  Negative for environmental allergies (on Allegra). Does not bruise/bleed easily.  Psychiatric/Behavioral:  Positive for depression. Negative for hallucinations and memory loss. The patient is not nervous/anxious and does not have insomnia.    Performance status (ECOG):  1  Vital Signs: Blood pressure 96/69, pulse 65, temperature (!) 97.1 F (36.2 C), resp. rate 18, weight 188 lb 3.2 oz (85.4 kg).   Physical Exam Vitals and nursing note reviewed.  Constitutional:      General: She is not in acute distress.    Appearance: She is well-developed. She is not diaphoretic.  HENT:     Head: Normocephalic and atraumatic.  Eyes:     General: No scleral icterus.    Pupils: Pupils are equal, round, and reactive to light.  Cardiovascular:     Rate and Rhythm: Normal rate.     Heart sounds: No murmur heard. Pulmonary:     Effort: Pulmonary effort is normal.  No respiratory distress.  Abdominal:     General: Bowel sounds are normal. There is no distension.     Palpations: Abdomen is soft. There is no hepatomegaly, splenomegaly or mass.     Tenderness: There is no rebound.  Musculoskeletal:  General: No swelling or tenderness. Normal range of motion.     Cervical back: Normal range of motion and neck supple.  Lymphadenopathy:     Head:     Right side of head: No preauricular, posterior auricular or occipital adenopathy.     Left side of head: No preauricular, posterior auricular or occipital adenopathy.     Cervical: No cervical adenopathy.     Upper Body:     Right upper body: No supraclavicular or axillary adenopathy.     Left upper body: No supraclavicular or axillary adenopathy.     Lower Body: No right inguinal adenopathy. No left inguinal adenopathy.  Skin:    General: Skin is warm and dry.  Neurological:     Mental Status: She is alert and oriented to person, place, and time.  Psychiatric:        Mood and Affect: Mood normal.    Labs.    Latest Ref Rng & Units 05/30/2022   12:59 PM 03/25/2022   11:15 AM 03/16/2022    5:55 AM  CBC  WBC 4.0 - 10.5 K/uL 7.1  8.3  6.2   Hemoglobin 12.0 - 15.0 g/dL 84.6  96.2  95.2   Hematocrit 36.0 - 46.0 % 36.9  37.0  30.5   Platelets 150 - 400 K/uL 300  311  180       Latest Ref Rng & Units 05/30/2022   12:59 PM 03/25/2022   11:15 AM 03/16/2022    5:55 AM  CMP  Glucose 70 - 99 mg/dL 841  324  401   BUN 8 - 23 mg/dL 32  27  28   Creatinine 0.44 - 1.00 mg/dL 0.27  2.53  6.64   Sodium 135 - 145 mmol/L 135  136  139   Potassium 3.5 - 5.1 mmol/L 5.2  5.4  4.8   Chloride 98 - 111 mmol/L 105  101  113   CO2 22 - 32 mmol/L 20  18  19    Calcium 8.9 - 10.3 mg/dL 9.5  9.6  8.3   Total Protein 6.5 - 8.1 g/dL 7.5     Total Bilirubin 0.3 - 1.2 mg/dL 0.8     Alkaline Phos 38 - 126 U/L 80     AST 15 - 41 U/L 23     ALT 0 - 44 U/L 17      Lab Results  Component Value Date   IRON 79 05/30/2022    TIBC 358 05/30/2022   FERRITIN 55 05/30/2022

## 2022-06-03 NOTE — Assessment & Plan Note (Addendum)
#  Anemia due to chronic kidney disease. Labs reviewed and discussed with patient. Lab Results  Component Value Date   HGB 12.2 05/30/2022   TIBC 358 05/30/2022   IRONPCTSAT 22 05/30/2022   FERRITIN 55 05/30/2022     Hemoglobin is normal. Iron panel is stable.  Hold off additional IV Venofer treatment at this point.  Observation.

## 2022-06-03 NOTE — Assessment & Plan Note (Signed)
Encourage oral hydration and avoid nephrotoxins.  Hyperkalemia, recommend pt to follow up with nephrology. Avoid K rich food

## 2022-06-03 NOTE — Progress Notes (Signed)
Pt here for follow up. No new concerns voiced.   

## 2022-06-22 IMAGING — MG DIGITAL SCREENING BILAT W/ TOMO W/ CAD
8 series · 8 of 24 positions shown · non-contrast
Comparison: Previous exam(s).

CLINICAL DATA: Screening.

EXAM:
DIGITAL SCREENING BILATERAL MAMMOGRAM WITH TOMO AND CAD

[R CC synth-2D]
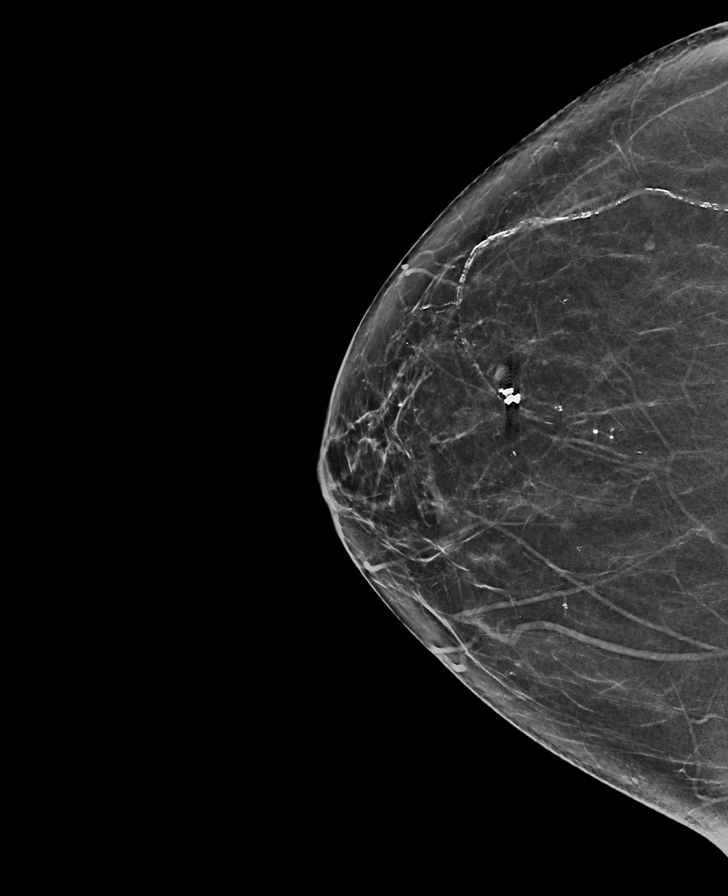

[L MLO synth-2D]
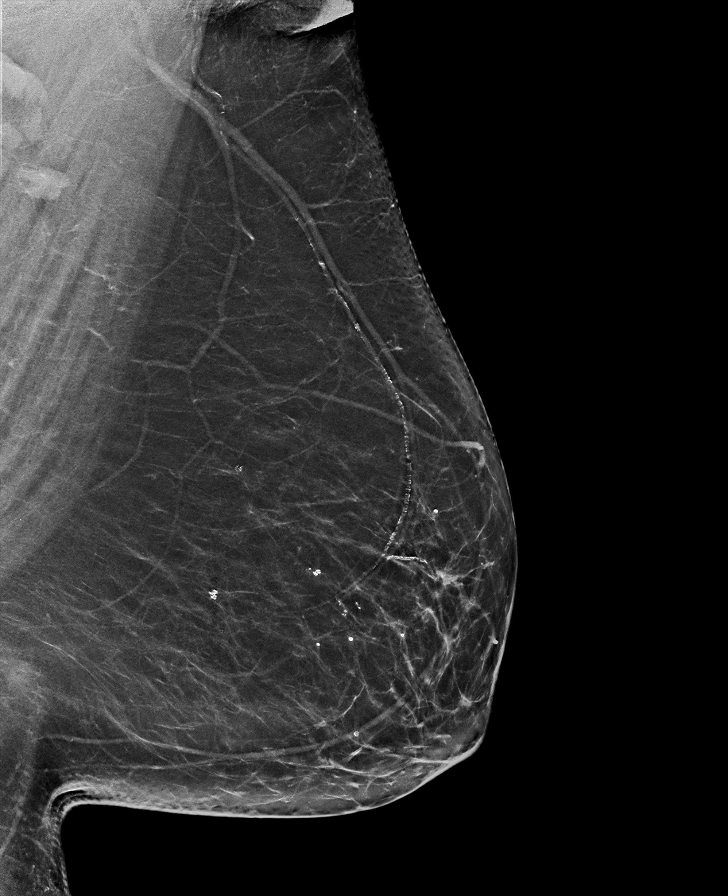

[R MLO synth-2D]
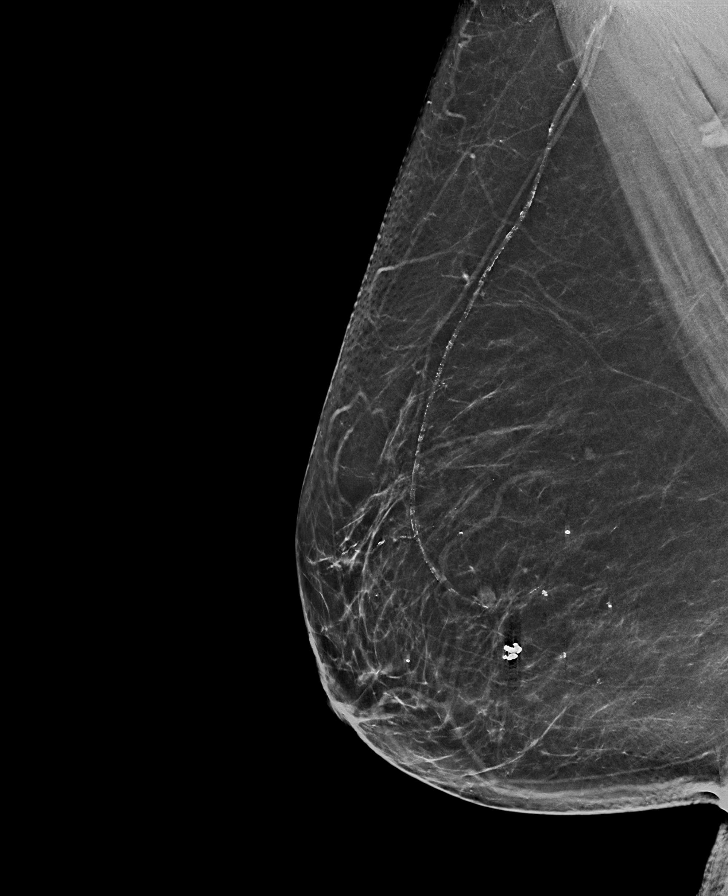

[L CC synth-2D]
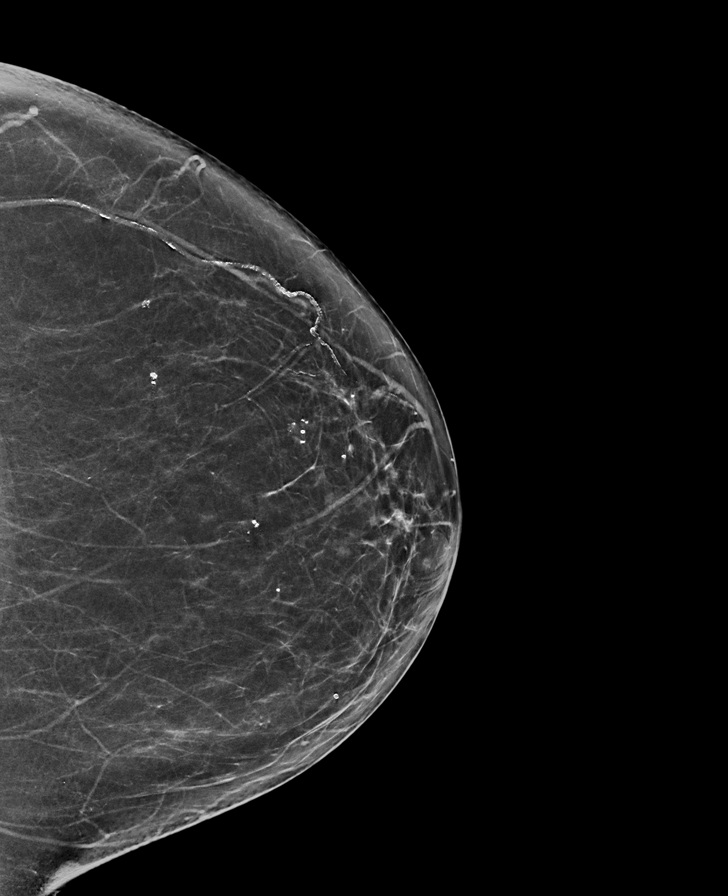

[R MLO tomo · tomo slice 37/73.0]
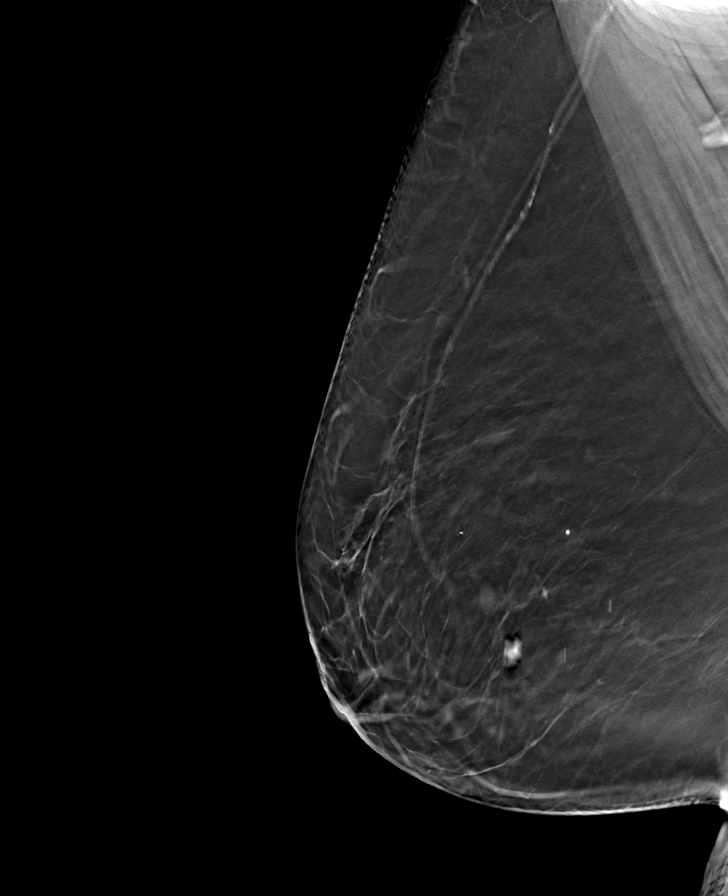

[L CC tomo · tomo slice 35/70.0]
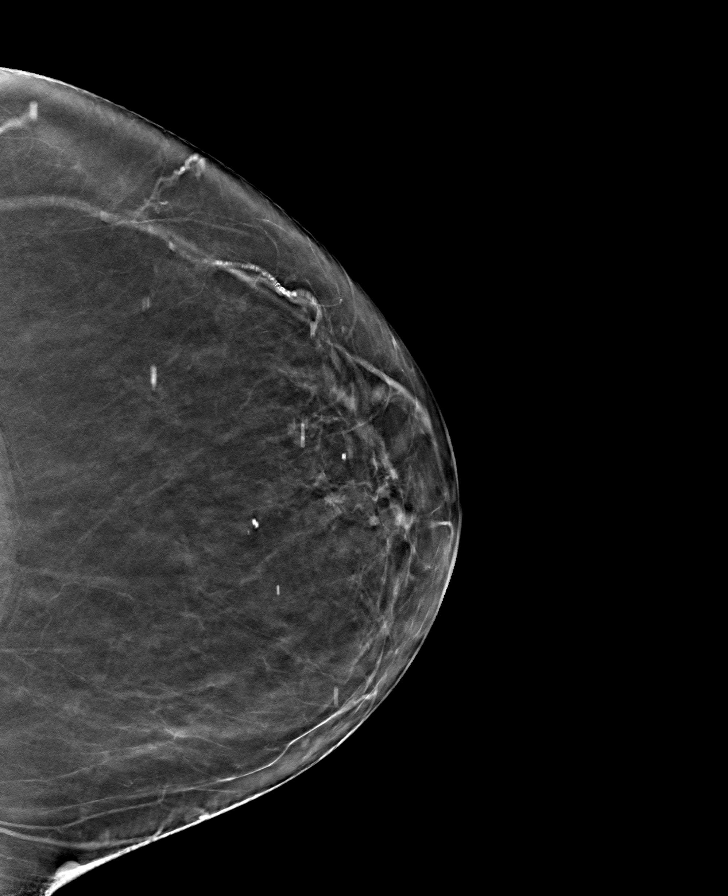

[L MLO tomo · tomo slice 41/81.0]
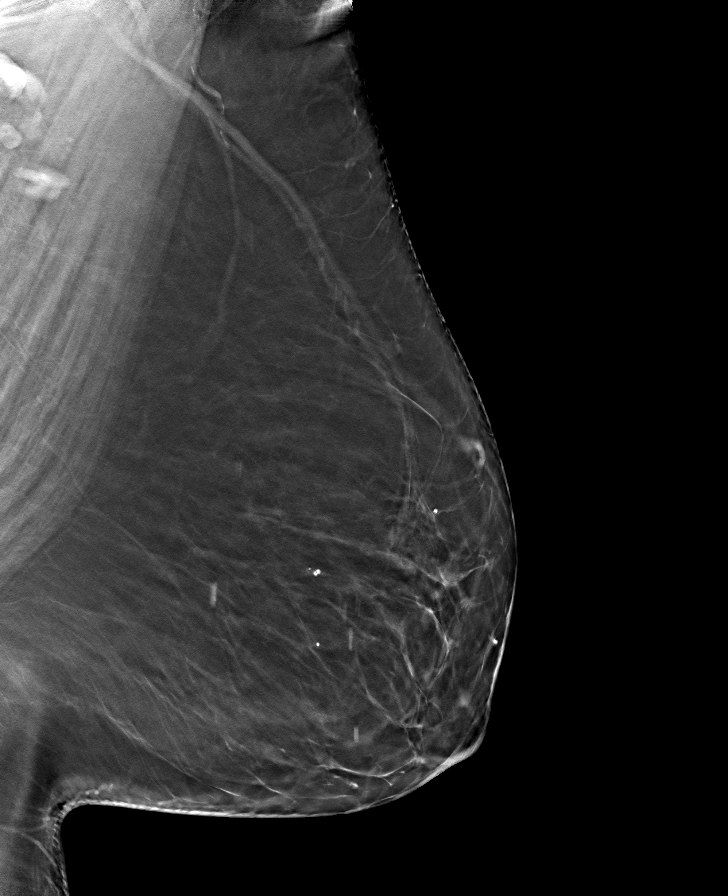

[R CC tomo · tomo slice 35/68.0]
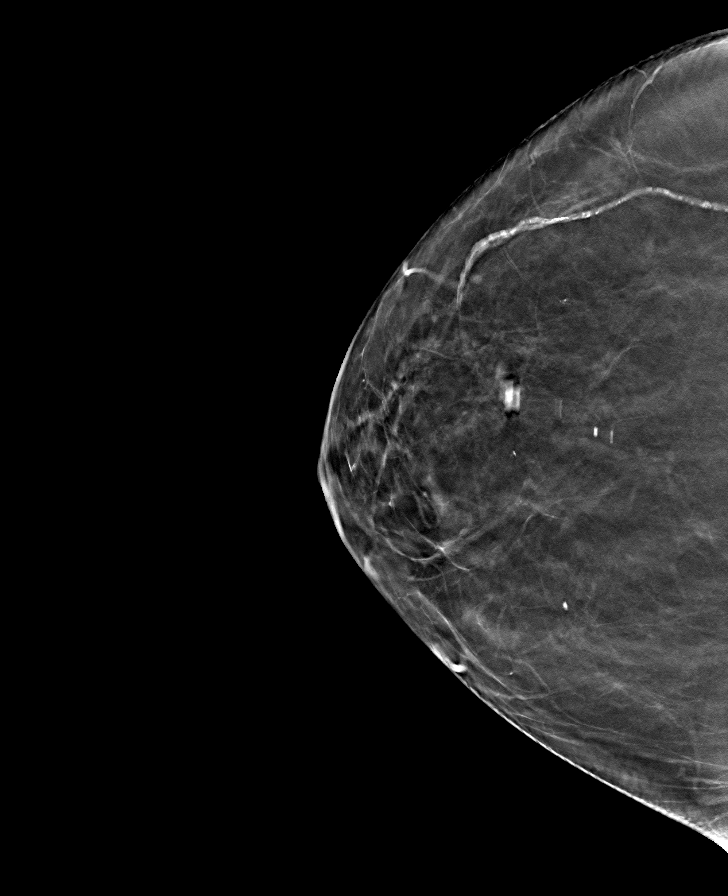

[8 of 24 positions shown; findings below may reference images not displayed]

ACR Breast Density Category b: There are scattered areas of
fibroglandular density.
FINDINGS: There are no findings suspicious for malignancy. Images were
processed with CAD.
IMPRESSION: No mammographic evidence of malignancy. A result letter of this
screening mammogram will be mailed directly to the patient.

RECOMMENDATION:
Screening mammogram in one year. (Code:[id number])

BI-RADS CATEGORY  1: Negative.

## 2022-07-01 ENCOUNTER — Ambulatory Visit: Payer: Medicare Other | Admitting: Gastroenterology

## 2022-07-01 ENCOUNTER — Encounter: Payer: Self-pay | Admitting: Gastroenterology

## 2022-07-01 VITALS — BP 120/76 | HR 76 | Temp 98.3°F | Ht 62.0 in | Wt 188.6 lb

## 2022-07-01 DIAGNOSIS — K529 Noninfective gastroenteritis and colitis, unspecified: Secondary | ICD-10-CM | POA: Diagnosis not present

## 2022-07-01 MED ORDER — CHOLESTYRAMINE 4 GM/DOSE PO POWD: 4.0000 g | Freq: Two times a day (BID) | ORAL | 6 refills | Status: DC

## 2022-07-01 NOTE — Progress Notes (Signed)
Wyline Mood MD, MRCP(U.K) [address]  [address]  [place], [place] [address]  Main: [phone]  Fax: [phone]   Gastroenterology Consultation  Referring Provider:     Reubin Milan, MD Primary Care Physician:  Reubin Milan, MD Primary Gastroenterologist:  Dr. Wyline Mood  Reason for Consultation:    Abdominal pain,  diarrhea        HPI:   Misty Villarreal is a 72 y.o. y/o female referred for consultation & management  by Dr. Judithann Graves, Nyoka Cowden, MD.     In February 2024 she was admitted to the hospital abdominal pain nausea vomiting with multiple episodes of diarrhea it began 3 days prior to admission.  Seen by surgery treated conservatively.  Diarrhea resolved and was sent home  03/14/2022: CT abdomen pelvis without contrast shows dilated distal jejunum and multiple mid to lower abdominal with segments of normal caliber which could indicate an ileus or partial small bowel obstruction no obstruction seen fluid within the colon stranding on the left lower abdominal small bowel mesentery with related inflammation nonspecific enteritis diverticulosis without diverticulitis.  03/14/2022 C. difficile negative GI PCR negative. 09/30/2022 hemoglobin 12.2 g CMP creatinine of 1.59.  05/30/2022 ferritin B12 normal  She states that since the hospital discharge she has been doing well except she has her baseline diarrhea which occurs 1-2 times a day.  Associated bloating and cramping.  She has had this for many years.  She has her gallbladder taken out.  Denies any use of artificial sugars weakness.  Denies any blood in the stool.  Denies any use of NSAIDs.  Last colonoscopy was back in 2019 which I performed myself for a positive Cologuard was normal except for internal hemorrhoids   Past Medical History:  Diagnosis Date   Abnormal glandular Papanicolaou smear of cervix 09/01/2014   Normal Pap 2012    Anemia    Chronic kidney disease    stage 3   Depression    Diabetes  mellitus without complication (HCC)    DVT (deep venous thrombosis) (HCC)    Hyperlipidemia    Hypertension    Peripheral vascular disease (HCC)    Peripheral vascular disease (HCC)    Slurred speech 12/03/2016    Past Surgical History:  Procedure Laterality Date   ANGIOPLASTY / STENTING FEMORAL Left 2014, 2013   BRAIN MENINGIOMA EXCISION  1988   BREAST BIOPSY Right 1989   benign   CARPAL TUNNEL RELEASE Left 1980   CATARACT EXTRACTION W/PHACO Right 09/02/2019   Procedure: CATARACT EXTRACTION PHACO AND INTRAOCULAR LENS PLACEMENT (IOC);  Surgeon: Nevada Crane, MD;  Location: [facility] OR[facility];  Service: Ophthalmology;  Laterality: Right;  [place] 00:27.3 CDE 1.92 AP% Fluid Pack lot # [id number]   CATARACT EXTRACTION W/PHACO Left 10/18/2019   Procedure: CATARACT EXTRACTION PHACO AND INTRAOCULAR LENS PLACEMENT (IOC) LEFT DIABETIC 2.55  00:28.5;  Surgeon: Nevada Crane, MD;  Location: [facility];  Service: Ophthalmology;  Laterality: Left;  Diabetic - oral meds   CESAREAN SECTION     CHOLECYSTECTOMY  1987   COLONOSCOPY WITH PROPOFOL N/A 05/08/2017   Procedure: COLONOSCOPY WITH PROPOFOL;  Surgeon: Wyline Mood, MD;  Location: [facility];  Service: Gastroenterology;  Laterality: N/A;   KNEE ARTHROPLASTY Right 2009   LOWER EXTREMITY ANGIOGRAPHY Left 07/21/2018   Procedure: LOWER EXTREMITY ANGIOGRAPHY;  Surgeon: Renford Dills, MD;  Location: [facility] INVASIVE CV LAB;  Service: Cardiovascular;  Laterality: Left;    Prior to Admission medications  Medication Sig Start Date End Date Taking? Authorizing Provider  albuterol (VENTOLIN HFA) 108 (90 Base) MCG/ACT inhaler Inhale 2 puffs into the lungs every 6 (six) hours as needed. 12/01/20   Reubin Milan, MD  aspirin 81 MG chewable tablet Chew 81 mg by mouth daily.     [provider]  atorvastatin (LIPITOR) 40 MG tablet TAKE 1 TABLET DAILY 01/14/22   Reubin Milan, MD  blood glucose meter kit and supplies KIT Dispense based  on patient and insurance preference. Use to check BS bid 04/04/21   Reubin Milan, MD  chlorhexidine (PERIDEX) 0.12 % solution Use as directed 15 mLs in the mouth or throat in the morning, at noon, and at bedtime. 01/02/22   [provider]  Cholecalciferol (VITAMIN D) 50 MCG (2000 UT) CAPS Take 2 capsules by mouth daily.    [provider]  clopidogrel (PLAVIX) 75 MG tablet TAKE 1 TABLET BY MOUTH DAILY 05/13/22   Schnier, Latina Craver, MD  Cyanocobalamin (VITAMIN B 12 PO) Take 1,000 mcg by mouth daily. 1000mg  daily    [provider]  diphenoxylate-atropine (LOMOTIL) 2.5-0.025 MG tablet Take 1 tablet by mouth 4 (four) times daily as needed for diarrhea or loose stools. 03/16/22   Sunnie Nielsen, DO  FARXIGA 10 MG TABS tablet TAKE 1 TABLET BY MOUTH DAILY  BEFORE BREAKFAST 10/08/21   Reubin Milan, MD  fexofenadine (ALLEGRA) 180 MG tablet Take 180 mg by mouth daily.    [provider]  lisinopril (ZESTRIL) 20 MG tablet TAKE 1 TABLET BY MOUTH DAILY 05/03/22   Reubin Milan, MD  loperamide (IMODIUM A-D) 2 MG tablet Take 2 mg by mouth as needed for diarrhea or loose stools.    [provider]  meclizine (ANTIVERT) 12.5 MG tablet TAKE 1 TABLET(12.5 MG) BY MOUTH TWICE DAILY AS NEEDED FOR DIZZINESS 02/25/22   Reubin Milan, MD  metoprolol tartrate (LOPRESSOR) 50 MG tablet Take 50 mg by mouth 2 (two) times daily. 10/17/20   [provider]  Multiple Vitamin tablet Take 1 tablet by mouth daily.     [provider]  ondansetron (ZOFRAN) 4 MG tablet Take 1-2 tablets (4-8 mg total) by mouth every 6 (six) hours as needed for nausea or vomiting. 03/16/22   Sunnie Nielsen, DO  ondansetron (ZOFRAN-ODT) 4 MG disintegrating tablet Take 1 tablet (4 mg total) by mouth every 8 (eight) hours as needed for nausea or vomiting. 05/25/21   Chesley Noon, MD  sertraline (ZOLOFT) 50 MG tablet TAKE 1 TABLET BY MOUTH DAILY 01/28/22   Reubin Milan, MD     Family History  Problem Relation Age of Onset   Diabetes Mother    Heart failure Father    Diabetes Father    Breast cancer Neg Hx      Social History   Tobacco Use   Smoking status: Former    Packs/day: 0.50    Years: 46.00    Additional pack years: 0.00    Total pack years: 23.00    Types: Cigarettes    Quit date: 04/05/2013    Years since quitting: 9.2   Smokeless tobacco: Never  Vaping Use   Vaping Use: Never used  Substance Use Topics   Alcohol use: Yes    Alcohol/week: 2.0 standard drinks of alcohol    Types: 2 Standard drinks or equivalent per week   Drug use: Never    Allergies as of 07/01/2022 - Review Complete 07/01/2022  Allergen  Reaction Noted   Augmentin [amoxicillin-pot clavulanate] Diarrhea 09/01/2014   Levaquin [levofloxacin] Nausea And Vomiting and Other (See Comments) 07/20/2020   Tetracycline  09/01/2014   Cefaclor Rash 09/13/2014   Cephalexin Rash 09/13/2014   Rosuvastatin Diarrhea 10/12/2019   Sulfa antibiotics Rash 09/01/2014    Review of Systems:    All systems reviewed and negative except where noted in HPI.   Physical Exam:  BP 120/76   Pulse 76   Temp 98.3 F (36.8 C) (Oral)   Ht 5\' 2"  (1.575 m)   Wt 188 lb 9.6 oz (85.5 kg)   LMP  (LMP Unknown)   BMI 34.50 kg/m  No LMP recorded (lmp unknown). Patient is postmenopausal. Psych:  Alert and cooperative. Normal mood and affect. General:   Alert,  Well-developed, well-nourished, pleasant and cooperative in NAD Head:  Normocephalic and atraumatic. Eyes:  Sclera clear, no icterus.   Conjunctiva pink.    Neurologic:  Alert and oriented x3;  grossly normal neurologically. Psych:  Alert and cooperative. Normal mood and affect.  Imaging Studies: No results found.  Assessment and Plan:   Marlissa Arkwright is a 72 y.o. y/o female has been referred for diarrhea.  Ongoing for many years.  History of cholecystectomy.  No recent change.  Admitted in February 2024 which appears to be an  episode of gastroenteritis.  That has resolved.  Colonoscopy in 2019 was normal.  I believe her diarrhea could be related to bile salt mediated diarrhea from her cholecystectomy.  Plan 1.  Celiac serology 2.  Trial of Questran 3.  If Questran does not help then we will consider a colonoscopy with biopsies to rule out microscopic colitis.  Other options down the road would be a trial of Creon versus treatment for IBS diarrhea with Xifaxan.  Follow up in 6 to 8 weeks with PA  Dr Wyline Mood MD,MRCP(U.K)

## 2022-07-02 LAB — CELIAC DISEASE AB SCREEN W/RFX
Antigliadin Abs, IgA: 4 units (ref 0–19)
IgA/Immunoglobulin A, Serum: 226 mg/dL (ref 64–422)
Transglutaminase IgA: <2 U/mL (ref 0–3)

## 2022-07-30 ENCOUNTER — Ambulatory Visit (INDEPENDENT_AMBULATORY_CARE_PROVIDER_SITE_OTHER): Payer: Medicare Other | Admitting: Internal Medicine

## 2022-07-30 ENCOUNTER — Encounter: Payer: Self-pay | Admitting: Internal Medicine

## 2022-07-30 VITALS — BP 128/76 | HR 69 | Ht 62.0 in | Wt 185.0 lb

## 2022-07-30 DIAGNOSIS — I10 Essential (primary) hypertension: Secondary | ICD-10-CM

## 2022-07-30 DIAGNOSIS — Z7984 Long term (current) use of oral hypoglycemic drugs: Secondary | ICD-10-CM | POA: Diagnosis not present

## 2022-07-30 DIAGNOSIS — E118 Type 2 diabetes mellitus with unspecified complications: Secondary | ICD-10-CM | POA: Diagnosis not present

## 2022-07-30 DIAGNOSIS — N1832 Chronic kidney disease, stage 3b: Secondary | ICD-10-CM | POA: Diagnosis not present

## 2022-07-30 LAB — POCT GLYCOSYLATED HEMOGLOBIN (HGB A1C): Hemoglobin A1C: 7.3 % — AB (ref 4.0–5.6)

## 2022-07-30 NOTE — Assessment & Plan Note (Signed)
Stable exam with well controlled BP.  Currently taking lisinopril and metoprolol. Tolerating medications without concerns or side effects. Will continue to recommend low sodium diet and current regimen.  

## 2022-07-30 NOTE — Assessment & Plan Note (Addendum)
Blood sugars stable without hypoglycemic symptoms or events. She has been working on diet changes and has lost a few pounds. Currently being treated with [place]. Lab Results  Component Value Date   HGBA1C 7.9 (H) 03/25/2022  A1C today = 7.3 Continue current medication and diet efforts.

## 2022-07-30 NOTE — Assessment & Plan Note (Signed)
Stable GFR on Farxiga

## 2022-07-30 NOTE — Progress Notes (Signed)
Date:  07/30/2022   Name:  Misty Villarreal   DOB:  03/27/1950   MRN:  [id number]   Chief Complaint: Diabetes  Diabetes She presents for her follow-up diabetic visit. She has type 2 diabetes mellitus. Pertinent negatives for hypoglycemia include no headaches or tremors. Pertinent negatives for diabetes include no chest pain, no fatigue, no polydipsia and no polyuria. Current diabetic treatment includes oral agent (monotherapy). She is compliant with treatment all of the time.    Lab Results  Component Value Date   NA 135 05/30/2022   K 5.2 (H) 05/30/2022   CO2 20 (L) 05/30/2022   GLUCOSE 166 (H) 05/30/2022   BUN 32 (H) 05/30/2022   CREATININE 1.59 (H) 05/30/2022   CALCIUM 9.5 05/30/2022   EGFR 30 (L) 03/25/2022   GFRNONAA 34 (L) 05/30/2022   Lab Results  Component Value Date   CHOL 211 (H) 12/07/2021   HDL 47 12/07/2021   LDLCALC 117 (H) 12/07/2021   LDLDIRECT 76 07/14/2020   TRIG 268 (H) 12/07/2021   CHOLHDL 4.5 (H) 12/07/2021   Lab Results  Component Value Date   TSH 3.200 12/07/2021   Lab Results  Component Value Date   HGBA1C 7.3 (A) 07/30/2022   Lab Results  Component Value Date   WBC 7.1 05/30/2022   HGB 12.2 05/30/2022   HCT 36.9 05/30/2022   MCV 89.3 05/30/2022   PLT 300 05/30/2022   Lab Results  Component Value Date   ALT 17 05/30/2022   AST 23 05/30/2022   ALKPHOS 80 05/30/2022   BILITOT 0.8 05/30/2022   Lab Results  Component Value Date   VD25OH 21.79 (L) 09/13/2019     Review of Systems  Constitutional:  Negative for appetite change, fatigue, fever and unexpected weight change.  HENT:  Negative for tinnitus and trouble swallowing.   Eyes:  Negative for visual disturbance.  Respiratory:  Negative for cough, chest tightness and shortness of breath.   Cardiovascular:  Negative for chest pain, palpitations and leg swelling.  Gastrointestinal:  Negative for abdominal pain.  Endocrine: Negative for polydipsia and polyuria.  Genitourinary:   Negative for dysuria and hematuria.  Musculoskeletal:  Negative for arthralgias.  Neurological:  Negative for tremors, numbness and headaches.  Psychiatric/Behavioral:  Negative for dysphoric mood.     Patient Active Problem List   Diagnosis Date Noted   CKD (chronic kidney disease) stage 3, GFR 30-59 ml/min (HCC) 06/03/2022   Abdominal pain 03/14/2022   HLD (hyperlipidemia) 03/14/2022   Type II diabetes mellitus with renal manifestations (HCC) 03/14/2022   Chronic diastolic CHF (congestive heart failure) (HCC) 03/14/2022   Obesity (BMI 30-39.9) 03/14/2022   Enteritis 03/14/2022   Benign essential tremor 12/07/2021   Coronary artery disease involving native coronary artery of native heart with angina pectoris (HCC) 05/14/2021   Atherosclerosis of native arteries of extremity with intermittent claudication (HCC) 05/15/2020   Palpitations 03/29/2020   Stable angina pectoris 03/29/2020   Aortic atherosclerosis (HCC) 11/19/2019   Other fatigue 09/07/2019   Stage 3b chronic kidney disease (HCC) 12/23/2018   Restless leg syndrome 12/23/2018   Unruptured synovial cyst of popliteal space 12/23/2018   Plantar fasciitis of left foot 12/23/2018   B12 deficiency 11/26/2018   Hypogammaglobulinemia (HCC) 11/11/2018   Anemia due to stage 3b chronic kidney disease (HCC) 11/11/2018   Osteopenia determined by x-ray 02/16/2018   Major depressive disorder with single episode, in partial remission (HCC) 08/04/2017   Neck pain on right side 08/05/2016  Tinnitus of right ear 08/05/2016   Type II diabetes mellitus with complication (HCC) 08/03/2015   Lymphedema 06/21/2015   History of paroxysmal supraventricular tachycardia 06/21/2015   PAD (peripheral artery disease) (HCC) 02/02/2015   Hyperlipidemia associated with type 2 diabetes mellitus (HCC) 09/01/2014   Neuropathy 09/01/2014   Phlebectasia 09/01/2014   Essential (primary) hypertension 09/01/2014   Spondylolisthesis at L4-L5 level 07/05/2014    Arthritis, degenerative 01/31/2014    Allergies  Allergen Reactions   Augmentin [Amoxicillin-Pot Clavulanate] Diarrhea   Levaquin [Levofloxacin] Nausea And Vomiting and Other (See Comments)    Ankle pain   Tetracycline     Other reaction(s): emesis   Cefaclor Rash   Cephalexin Rash   Rosuvastatin Diarrhea   Sulfa Antibiotics Rash    Other reaction(s): emesis    Past Surgical History:  Procedure Laterality Date   ANGIOPLASTY / STENTING FEMORAL Left 2014, 2013   BRAIN MENINGIOMA EXCISION  1988   BREAST BIOPSY Right 1989   benign   CARPAL TUNNEL RELEASE Left 1980   CATARACT EXTRACTION W/PHACO Right 09/02/2019   Procedure: CATARACT EXTRACTION PHACO AND INTRAOCULAR LENS PLACEMENT (IOC);  Surgeon: Nevada Crane, MD;  Location: [facility] ORS;  Service: Ophthalmology;  Laterality: Right;  [place] 00:27.3 CDE 1.92 AP% Fluid Pack lot # [id number]   CATARACT EXTRACTION W/PHACO Left 10/18/2019   Procedure: CATARACT EXTRACTION PHACO AND INTRAOCULAR LENS PLACEMENT (IOC) LEFT DIABETIC 2.55  00:28.5;  Surgeon: Nevada Crane, MD;  Location: [facility];  Service: Ophthalmology;  Laterality: Left;  Diabetic - oral meds   CESAREAN SECTION     CHOLECYSTECTOMY  1987   COLONOSCOPY WITH PROPOFOL N/A 05/08/2017   Procedure: COLONOSCOPY WITH PROPOFOL;  Surgeon: Wyline Mood, MD;  Location: [facility] END[facility]COPY;  Service: Gastroenterology;  Laterality: N/A;   KNEE ARTHROPLASTY Right 2009   LOWER EXTREMITY ANGIOGRAPHY Left 07/21/2018   Procedure: LOWER EXTREMITY ANGIOGRAPHY;  Surgeon: Renford Dills, MD;  Location: [facility];  Service: Cardiovascular;  Laterality: Left;    Social History   Tobacco Use   Smoking status: Former    Packs/day: 0.50    Years: 46.00    Additional pack years: 0.00    Total pack years: 23.00    Types: Cigarettes    Quit date: 04/05/2013    Years since quitting: 9.3   Smokeless tobacco: Never  Vaping Use   Vaping Use: Never used  Substance Use Topics    Alcohol use: Yes    Alcohol/week: 2.0 standard drinks of alcohol    Types: 2 Standard drinks or equivalent per week   Drug use: Never     Medication list has been reviewed and updated.  Current Meds  Medication Sig   albuterol (VENTOLIN HFA) 108 (90 Base) MCG/ACT inhaler Inhale 2 puffs into the lungs every 6 (six) hours as needed.   aspirin 81 MG chewable tablet Chew 81 mg by mouth daily.    atorvastatin (LIPITOR) 40 MG tablet TAKE 1 TABLET DAILY   blood glucose meter kit and supplies KIT Dispense based on patient and insurance preference. Use to check BS bid   chlorhexidine (PERIDEX) 0.12 % solution Use as directed 15 mLs in the mouth or throat in the morning, at noon, and at bedtime.   Cholecalciferol (VITAMIN D) 50 MCG (2000 UT) CAPS Take 2 capsules by mouth daily.   cholestyramine (QUESTRAN) 4 GM/DOSE powder Take 1 packet (4 g total) by mouth 2 (two) times daily with a meal.   clopidogrel (PLAVIX)  75 MG tablet TAKE 1 TABLET BY MOUTH DAILY   Cyanocobalamin (VITAMIN B 12 PO) Take 1,000 mcg by mouth daily. 1000mg  daily   diphenoxylate-atropine (LOMOTIL) 2.5-0.025 MG tablet Take 1 tablet by mouth 4 (four) times daily as needed for diarrhea or loose stools.   FARXIGA 10 MG TABS tablet TAKE 1 TABLET BY MOUTH DAILY  BEFORE BREAKFAST   fexofenadine (ALLEGRA) 180 MG tablet Take 180 mg by mouth daily.   lansoprazole (PREVACID) 30 MG capsule Take 30 mg by mouth daily at 12 noon.   lisinopril (ZESTRIL) 20 MG tablet TAKE 1 TABLET BY MOUTH DAILY   loperamide (IMODIUM A-D) 2 MG tablet Take 2 mg by mouth as needed for diarrhea or loose stools.   meclizine (ANTIVERT) 12.5 MG tablet TAKE 1 TABLET(12.5 MG) BY MOUTH TWICE DAILY AS NEEDED FOR DIZZINESS   metoprolol tartrate (LOPRESSOR) 50 MG tablet Take 50 mg by mouth 2 (two) times daily.   Multiple Vitamin tablet Take 1 tablet by mouth daily.    sertraline (ZOLOFT) 50 MG tablet TAKE 1 TABLET BY MOUTH DAILY       07/30/2022    9:35 AM 03/25/2022    10:51 AM 12/07/2021   10:01 AM 08/06/2021    2:57 PM  GAD 7 : Generalized Anxiety Score  Nervous, Anxious, on Edge 0 0 0 0  Control/stop worrying 0 0 0 0  Worry too much - different things 0 0 0 0  Trouble relaxing 0 0 0 0  Restless 0 0 0 0  Easily annoyed or irritable 0 0 0 0  Afraid - awful might happen 0 0 0 0  Total GAD 7 Score 0 0 0 0  Anxiety Difficulty Not difficult at all Not difficult at all Not difficult at all Not difficult at all       07/30/2022    9:35 AM 03/25/2022   10:51 AM 12/07/2021   10:01 AM  Depression screen PHQ 2/9  Decreased Interest 0 0 0  Down, Depressed, Hopeless 0 0 0  PHQ - 2 Score 0 0 0  Altered sleeping 0 0 0  Tired, decreased energy 0 0 0  Change in appetite 0 0 0  Feeling bad or failure about yourself  0 0 0  Trouble concentrating 0 0 0  Moving slowly or fidgety/restless 0 0 0  Suicidal thoughts 0 0 0  PHQ-9 Score 0 0 0  Difficult doing work/chores Not difficult at all Not difficult at all Not difficult at all    BP Readings from Last 3 Encounters:  07/30/22 128/76  07/01/22 120/76  06/03/22 96/69    Physical Exam Vitals and nursing note reviewed.  Constitutional:      General: She is not in acute distress.    Appearance: Normal appearance. She is well-developed.  HENT:     Head: Normocephalic and atraumatic.  Cardiovascular:     Rate and Rhythm: Normal rate and regular rhythm.  Pulmonary:     Effort: Pulmonary effort is normal. No respiratory distress.     Breath sounds: No wheezing or rhonchi.  Musculoskeletal:     Cervical back: Normal range of motion.     Right lower leg: No edema.     Left lower leg: No edema.  Lymphadenopathy:     Cervical: No cervical adenopathy.  Skin:    General: Skin is warm and dry.     Findings: No rash.  Neurological:     Mental Status: She is alert and oriented  to person, place, and time.  Psychiatric:        Mood and Affect: Mood normal.        Behavior: Behavior normal.     Wt Readings  from Last 3 Encounters:  07/30/22 185 lb (83.9 kg)  07/01/22 188 lb 9.6 oz (85.5 kg)  06/03/22 188 lb 3.2 oz (85.4 kg)    BP 128/76   Pulse 69   Ht 5\' 2"  (1.575 m)   Wt 185 lb (83.9 kg)   LMP  (LMP Unknown)   SpO2 98%   BMI 33.84 kg/m   Assessment and Plan:  Problem List Items Addressed This Visit     Type II diabetes mellitus with complication (HCC) - Primary (Chronic)    Blood sugars stable without hypoglycemic symptoms or events. She has been working on diet changes and has lost a few pounds. Currently being treated with [place]. Lab Results  Component Value Date   HGBA1C 7.9 (H) 03/25/2022  A1C today = 7.3 Continue current medication and diet efforts.       Relevant Orders   POCT glycosylated hemoglobin (Hb A1C) (Completed)   Microalbumin / creatinine urine ratio   Stage 3b chronic kidney disease (HCC) (Chronic)    Stable GFR on Farxiga       Essential (primary) hypertension (Chronic)    Stable exam with well controlled BP.  Currently taking lisinopril and metoprolol. Tolerating medications without concerns or side effects. Will continue to recommend low sodium diet and current regimen.       Other Visit Diagnoses     Long term current use of oral hypoglycemic drug           Return in about 4 months (around 11/30/2022) for CPX.   Partially dictated using Dragon software, any errors are not intentional.  Reubin Milan, MD [facility] [place], [place]

## 2022-07-31 LAB — MICROALBUMIN / CREATININE URINE RATIO
Creatinine, Urine: 223.5 mg/dL
Microalb/Creat Ratio: 12 mg/g creat (ref 0–29)
Microalbumin, Urine: 26.6 ug/mL

## 2022-09-02 ENCOUNTER — Encounter: Payer: Self-pay | Admitting: Physician Assistant

## 2022-09-02 ENCOUNTER — Ambulatory Visit: Payer: Medicare Other | Admitting: Physician Assistant

## 2022-09-02 VITALS — BP 134/71 | HR 75 | Temp 97.8°F | Ht 62.0 in | Wt 183.2 lb

## 2022-09-02 DIAGNOSIS — K580 Irritable bowel syndrome with diarrhea: Secondary | ICD-10-CM | POA: Diagnosis not present

## 2022-09-02 DIAGNOSIS — K915 Postcholecystectomy syndrome: Secondary | ICD-10-CM

## 2022-09-02 DIAGNOSIS — K529 Noninfective gastroenteritis and colitis, unspecified: Secondary | ICD-10-CM

## 2022-09-02 MED ORDER — CHOLESTYRAMINE 4 GM/DOSE PO POWD: 4.0000 g | Freq: Two times a day (BID) | ORAL | 6 refills | Status: AC

## 2022-09-02 MED ORDER — CHOLESTYRAMINE 4 GM/DOSE PO POWD: 4.0000 g | Freq: Two times a day (BID) | ORAL | 6 refills | Status: DC

## 2022-09-02 NOTE — Progress Notes (Signed)
Celso Amy, PA-C [address]  [address]  [place], [place] [address]  Main: [phone]  Fax: [phone]   Primary Care Physician: Reubin Milan, MD  Primary Gastroenterologist:  Celso Amy, PA-C / Dr. Wyline Mood    CC: F/U Chronic Diarrhea  HPI: Solomiya Gaier is a 72 y.o. female returns for 2 month f/u Chronic Diarrhea. Celiac Labs Negative 06/2022.  For the past 2 months she was started on cholestyramine 1 packet once daily in a drink.  This has greatly helped her diarrhea.  She tried taking 1 packet twice daily which caused constipation.  1 packet once daily works better.  She has 1 - 2 formed Bms daily.  She rarely takes an Imodium if needed for breakthrough diarrhea.  Overall feeling a lot better.  Happy with current treatment.  Denies abdominal pain, rectal bleeding, or weight loss.  Has history of IBS and bile salt diarrhea postcholecystectomy.   03/14/2022: CT abdomen pelvis without contrast shows dilated distal jejunum and multiple mid to lower abdominal with segments of normal caliber which could indicate an ileus or partial small bowel obstruction no obstruction seen fluid within the colon stranding on the left lower abdominal small bowel mesentery with related inflammation nonspecific enteritis diverticulosis without diverticulitis.   03/14/2022 C. difficile negative GI PCR negative. 09/30/2022 hemoglobin 12.2 g CMP creatinine of 1.59.  05/30/2022 ferritin B12 normal   Last colonoscopy by Dr. Tobi Bastos in 2019 (for positive Cologuard)  showed internal hemorrhoids, otherwise Normal.  10 year repeat.  Current Outpatient Medications  Medication Sig Dispense Refill   albuterol (VENTOLIN HFA) 108 (90 Base) MCG/ACT inhaler Inhale 2 puffs into the lungs every 6 (six) hours as needed. 54 g 0   aspirin 81 MG chewable tablet Chew 81 mg by mouth daily.      atorvastatin (LIPITOR) 40 MG tablet TAKE 1 TABLET DAILY 90 tablet 3   blood glucose meter kit and supplies KIT  Dispense based on patient and insurance preference. Use to check BS bid 1 each 0   chlorhexidine (PERIDEX) 0.12 % solution Use as directed 15 mLs in the mouth or throat in the morning, at noon, and at bedtime.     Cholecalciferol (VITAMIN D) 50 MCG (2000 UT) CAPS Take 2 capsules by mouth daily.     cholestyramine (QUESTRAN) 4 GM/DOSE powder Take 1 packet (4 g total) by mouth 2 (two) times daily with a meal. 120 g 6   clopidogrel (PLAVIX) 75 MG tablet TAKE 1 TABLET BY MOUTH DAILY 90 tablet 3   Cyanocobalamin (VITAMIN B 12 PO) Take 1,000 mcg by mouth daily. 1000mg  daily     diphenoxylate-atropine (LOMOTIL) 2.5-0.025 MG tablet Take 1 tablet by mouth 4 (four) times daily as needed for diarrhea or loose stools. 15 tablet 0   FARXIGA 10 MG TABS tablet TAKE 1 TABLET BY MOUTH DAILY  BEFORE BREAKFAST 90 tablet 3   fexofenadine (ALLEGRA) 180 MG tablet Take 180 mg by mouth daily.     lansoprazole (PREVACID) 30 MG capsule Take 30 mg by mouth daily at 12 noon.     lisinopril (ZESTRIL) 20 MG tablet TAKE 1 TABLET BY MOUTH DAILY 90 tablet 3   loperamide (IMODIUM A-D) 2 MG tablet Take 2 mg by mouth as needed for diarrhea or loose stools.     meclizine (ANTIVERT) 12.5 MG tablet TAKE 1 TABLET(12.5 MG) BY MOUTH TWICE DAILY AS NEEDED FOR DIZZINESS 60 tablet 5   metoprolol tartrate (LOPRESSOR) 50  MG tablet Take 50 mg by mouth 2 (two) times daily.     Multiple Vitamin tablet Take 1 tablet by mouth daily.      sertraline (ZOLOFT) 50 MG tablet TAKE 1 TABLET BY MOUTH DAILY 90 tablet 1   No current facility-administered medications for this visit.    Allergies as of 09/02/2022 - Review Complete 09/02/2022  Allergen Reaction Noted   Augmentin [amoxicillin-pot clavulanate] Diarrhea 09/01/2014   Levaquin [levofloxacin] Nausea And Vomiting and Other (See Comments) 07/20/2020   Tetracycline  09/01/2014   Cefaclor Rash 09/13/2014   Cephalexin Rash 09/13/2014   Rosuvastatin Diarrhea 10/12/2019   Sulfa antibiotics Rash  09/01/2014    Past Medical History:  Diagnosis Date   Abnormal glandular Papanicolaou smear of cervix 09/01/2014   Normal Pap 2012    Anemia    Chronic kidney disease    stage 3   Depression    Diabetes mellitus without complication (HCC)    DVT (deep venous thrombosis) (HCC)    Hyperlipidemia    Hypertension    Peripheral vascular disease (HCC)    Peripheral vascular disease (HCC)    Slurred speech 12/03/2016    Past Surgical History:  Procedure Laterality Date   ANGIOPLASTY / STENTING FEMORAL Left 2014, 2013   BRAIN MENINGIOMA EXCISION  1988   BREAST BIOPSY Right 1989   benign   CARPAL TUNNEL RELEASE Left 1980   CATARACT EXTRACTION W/PHACO Right 09/02/2019   Procedure: CATARACT EXTRACTION PHACO AND INTRAOCULAR LENS PLACEMENT (IOC);  Surgeon: Nevada Crane, MD;  Location: [facility]S;  Service: Ophthalmology;  Laterality: Right;  [place] 00:27.3 CDE 1.92 AP% Fluid Pack lot # [id number]   CATARACT EXTRACTION W/PHACO Left 10/18/2019   Procedure: CATARACT EXTRACTION PHACO AND INTRAOCULAR LENS PLACEMENT (IOC) LEFT DIABETIC 2.55  00:28.5;  Surgeon: Nevada Crane, MD;  Location: [facility];  Service: Ophthalmology;  Laterality: Left;  Diabetic - oral meds   CESAREAN SECTION     CHOLECYSTECTOMY  1987   COLONOSCOPY WITH PROPOFOL N/A 05/08/2017   Procedure: COLONOSCOPY WITH PROPOFOL;  Surgeon: Wyline Mood, MD;  Location[facility];  Service: Gastroenterology;  Laterality: N/A;   KNEE ARTHROPLASTY Right 2009   LOWER EXTREMITY ANGIOGRAPHY Left 07/21/2018   Procedure: LOWER EXTREMITY ANGIOGRAPHY;  Surgeon: Renford Dills, MD;  Location: [facility] INVASIVE [facility];  Service: Cardiovascular;  Laterality: Left;    Review of Systems:    All systems reviewed and negative except where noted in HPI.   Physical Examination:   BP 134/71   Pulse 75   Temp 97.8 F (36.6 C)   Ht 5\' 2"  (1.575 m)   Wt 183 lb 3.2 oz (83.1 kg)   LMP  (LMP Unknown)   BMI 33.51 kg/m   General:  Well-nourished, well-developed in no acute distress.  Neuro: Alert and oriented x 3.  Grossly intact.  Psych: Alert and cooperative, normal mood and affect. No Exam performed.   Imaging Studies: No results found.  Assessment and Plan:   Loralye Reffner is a 72 y.o. y/o female returns for 59-month follow-up of chronic diarrhea attributed to irritable bowel syndrome versus bile salt diarrhea postcholecystectomy.  Currently doing well on cholestyramine powder 1 packet in a drink once daily.  Rarely takes Imodium as needed.  She is happy with current treatment.  1.  Chronic diarrhea attributed to irritable bowel syndrome, diarrhea predominant and Bile salt diarrhea postcholecystectomy controlled on current treatment.  Plan:  Continue current treatment. Continue cholestyramine powder, mix  1 packet in a drink once daily.  Continue OTC Imodium as needed.  10-year repeat screening colonoscopy will be due 2029 pending health.  Celso Amy, PA-C  Follow up in 1 year to refill medication or sooner if she has recurrent worsening diarrhea.

## 2022-10-09 ENCOUNTER — Other Ambulatory Visit: Payer: Self-pay | Admitting: Internal Medicine

## 2022-10-09 DIAGNOSIS — E118 Type 2 diabetes mellitus with unspecified complications: Secondary | ICD-10-CM

## 2022-10-09 DIAGNOSIS — F32A Depression, unspecified: Secondary | ICD-10-CM

## 2022-11-15 ENCOUNTER — Telehealth: Payer: Self-pay | Admitting: Internal Medicine

## 2022-11-15 ENCOUNTER — Ambulatory Visit
Admission: EM | Admit: 2022-11-15 | Discharge: 2022-11-15 | Disposition: A | Discharge status: Home / Self Care | Payer: Medicare Other | Attending: Physician Assistant | Admitting: Physician Assistant

## 2022-11-15 ENCOUNTER — Encounter: Payer: Self-pay | Admitting: Emergency Medicine

## 2022-11-15 DIAGNOSIS — E785 Hyperlipidemia, unspecified: Secondary | ICD-10-CM | POA: Insufficient documentation

## 2022-11-15 DIAGNOSIS — N1832 Chronic kidney disease, stage 3b: Secondary | ICD-10-CM | POA: Diagnosis not present

## 2022-11-15 DIAGNOSIS — Z7984 Long term (current) use of oral hypoglycemic drugs: Secondary | ICD-10-CM | POA: Insufficient documentation

## 2022-11-15 DIAGNOSIS — I130 Hypertensive heart and chronic kidney disease with heart failure and stage 1 through stage 4 chronic kidney disease, or unspecified chronic kidney disease: Secondary | ICD-10-CM | POA: Diagnosis not present

## 2022-11-15 DIAGNOSIS — J029 Acute pharyngitis, unspecified: Secondary | ICD-10-CM

## 2022-11-15 DIAGNOSIS — B9789 Other viral agents as the cause of diseases classified elsewhere: Secondary | ICD-10-CM | POA: Insufficient documentation

## 2022-11-15 DIAGNOSIS — E1122 Type 2 diabetes mellitus with diabetic chronic kidney disease: Secondary | ICD-10-CM | POA: Diagnosis not present

## 2022-11-15 DIAGNOSIS — I5032 Chronic diastolic (congestive) heart failure: Secondary | ICD-10-CM | POA: Diagnosis not present

## 2022-11-15 DIAGNOSIS — Z1152 Encounter for screening for COVID-19: Secondary | ICD-10-CM | POA: Diagnosis not present

## 2022-11-15 DIAGNOSIS — J069 Acute upper respiratory infection, unspecified: Secondary | ICD-10-CM

## 2022-11-15 DIAGNOSIS — R051 Acute cough: Secondary | ICD-10-CM

## 2022-11-15 DIAGNOSIS — Z7902 Long term (current) use of antithrombotics/antiplatelets: Secondary | ICD-10-CM | POA: Insufficient documentation

## 2022-11-15 LAB — RESP PANEL BY RT-PCR (FLU A&B, COVID) ARPGX2
Influenza A by PCR: NEGATIVE
Influenza B by PCR: NEGATIVE
SARS Coronavirus 2 by RT PCR: NEGATIVE

## 2022-11-15 LAB — GROUP A STREP BY PCR: Group A Strep by PCR: NOT DETECTED

## 2022-11-15 MED ORDER — PROMETHAZINE-DM 6.25-15 MG/5ML PO SYRP: 5.0000 mL | ORAL_SOLUTION | Freq: Four times a day (QID) | ORAL | 0 refills | Status: DC | PRN

## 2022-11-15 MED ORDER — LIDOCAINE VISCOUS HCL 2 % MT SOLN: 15.0000 mL | OROMUCOSAL | 0 refills | Status: DC | PRN

## 2022-11-15 MED ORDER — IPRATROPIUM BROMIDE 0.06 % NA SOLN: 2.0000 | Freq: Four times a day (QID) | NASAL | 0 refills | Status: DC

## 2022-11-15 NOTE — Discharge Instructions (Signed)
-  Negative flu, COVID and strep -You have a cold which is due to a virus  URI/COLD SYMPTOMS: Your exam today is consistent with a viral illness. Antibiotics are not indicated at this time. Use medications as directed, including cough syrup, nasal saline, and decongestants. Your symptoms should improve over the next few days and resolve within 7-10 days. Increase rest and fluids. F/u if symptoms worsen or predominate such as sore throat, ear pain, productive cough, shortness of breath, or if you develop high fevers or worsening fatigue over the next several days.

## 2022-11-15 NOTE — ED Triage Notes (Signed)
Patient c/o sore throat, cough, congestion, and bodyaches that started 3 days.  Patient reports low grade fevers.

## 2022-11-15 NOTE — Telephone Encounter (Signed)
Copied from CRM [id number]. Topic: Medicare AWV >> Nov 15, 2022  9:54 AM Payton Doughty wrote: Reason for CRM: Called LVM 11/15/18 24 to schedule Annual Wellness Visit  Verlee Rossetti; Care Guide Ambulatory Clinical Support [facility] Direct Dial: [phone]

## 2022-11-15 NOTE — ED Provider Notes (Signed)
[facility]    CSN: [id number] Arrival date & time: 11/15/22  1405      History   Chief Complaint Chief Complaint  Patient presents with   Cough   Sore Throat    HPI Misty Villarreal is a 72 y.o. female presenting for 2-day history of fatigue, low-grade fever, body aches, cough, congestion, sore throat, voice hoarseness and chest discomfort with coughing and breathing.  Denies feeling short of breath.  No abdominal pain, nausea/vomiting.  Denies sick contacts.  Has been using nasal saline, Mucinex and taking Tylenol.  No other complaints or concerns.  Medical history significant for stage III CKD, anemia, diabetes, DVT, hypertension, hyperlipidemia and long-term use of anticoagulants.  HPI  Past Medical History:  Diagnosis Date   Abnormal glandular Papanicolaou smear of cervix 09/01/2014   Normal Pap 2012    Anemia    Chronic kidney disease    stage 3   Depression    Diabetes mellitus without complication (HCC)    DVT (deep venous thrombosis) (HCC)    Hyperlipidemia    Hypertension    Peripheral vascular disease (HCC)    Peripheral vascular disease (HCC)    Slurred speech 12/03/2016    Patient Active Problem List   Diagnosis Date Noted   CKD (chronic kidney disease) stage 3, GFR 30-59 ml/min (HCC) 06/03/2022   Abdominal pain 03/14/2022   HLD (hyperlipidemia) 03/14/2022   Type II diabetes mellitus with renal manifestations (HCC) 03/14/2022   Chronic diastolic CHF (congestive heart failure) (HCC) 03/14/2022   Obesity (BMI 30-39.9) 03/14/2022   Enteritis 03/14/2022   Benign essential tremor 12/07/2021   Coronary artery disease involving native coronary artery of native heart with angina pectoris (HCC) 05/14/2021   Atherosclerosis of native arteries of extremity with intermittent claudication (HCC) 05/15/2020   Palpitations 03/29/2020   Stable angina pectoris (HCC) 03/29/2020   Aortic atherosclerosis (HCC) 11/19/2019   Other fatigue 09/07/2019    Stage 3b chronic kidney disease (HCC) 12/23/2018   Restless leg syndrome 12/23/2018   Unruptured synovial cyst of popliteal space 12/23/2018   Plantar fasciitis of left foot 12/23/2018   B12 deficiency 11/26/2018   Hypogammaglobulinemia (HCC) 11/11/2018   Anemia due to stage 3b chronic kidney disease (HCC) 11/11/2018   Osteopenia determined by x-ray 02/16/2018   Major depressive disorder with single episode, in partial remission (HCC) 08/04/2017   Neck pain on right side 08/05/2016   Tinnitus of right ear 08/05/2016   Type II diabetes mellitus with complication (HCC) 08/03/2015   Lymphedema 06/21/2015   History of paroxysmal supraventricular tachycardia 06/21/2015   PAD (peripheral artery disease) (HCC) 02/02/2015   Hyperlipidemia associated with type 2 diabetes mellitus (HCC) 09/01/2014   Neuropathy 09/01/2014   Phlebectasia 09/01/2014   Essential (primary) hypertension 09/01/2014   Spondylolisthesis at L4-L5 level 07/05/2014   Arthritis, degenerative 01/31/2014    Past Surgical History:  Procedure Laterality Date   ANGIOPLASTY / STENTING FEMORAL Left 2014, 2013   BRAIN MENINGIOMA EXCISION  1988   BREAST BIOPSY Right 1989   benign   CARPAL TUNNEL RELEASE Left 1980   CATARACT EXTRACTION W/PHACO Right 09/02/2019   Procedure: CATARACT EXTRACTION PHACO AND INTRAOCULAR LENS PLACEMENT (IOC);  Surgeon: Nevada Crane, MD;  Location: [facility] ORS;  Service: Ophthalmology;  Laterality: Right;  [place] 00:27.3 CDE 1.92 AP% Fluid Pack lot # [id number]   CATARACT EXTRACTION W/PHACO Left 10/18/2019   Procedure: CATARACT EXTRACTION PHACO AND INTRAOCULAR LENS PLACEMENT (IOC) LEFT DIABETIC 2.55  00:28.5;  Surgeon:  [place] Crane, MD;  Location: [facility];  Service: Ophthalmology;  Laterality: Left;  Diabetic - oral meds   CESAREAN SECTION     CHOLECYSTECTOMY  1987   COLONOSCOPY WITH PROPOFOL N/A 05/08/2017   Procedure: COLONOSCOPY WITH PROPOFOL;  Surgeon: Wyline Mood, MD;  Location: [facility]  ENDOSCOPY;  Service: Gastroenterology;  Laterality: N/A;   KNEE ARTHROPLASTY Right 2009   LOWER EXTREMITY ANGIOGRAPHY Left 07/21/2018   Procedure: LOWER EXTREMITY ANGIOGRAPHY;  Surgeon: Renford Dills, MD;  Location: [facility] INVASIVE CV LAB;  Service: Cardiovascular;  Laterality: Left;    OB History   No obstetric history on file.      Home Medications    Prior to Admission medications   Medication Sig Start Date End Date Taking? Authorizing Provider  ipratropium (ATROVENT) 0.06 % nasal spray Place 2 sprays into both nostrils 4 (four) times daily. 11/15/22  Yes Eusebio Friendly B, PA-C  lidocaine (XYLOCAINE) 2 % solution Use as directed 15 mLs in the mouth or throat every 3 (three) hours as needed for mouth pain (swish and spit). 11/15/22  Yes Shirlee Latch, PA-C  promethazine-dextromethorphan (PROMETHAZINE-DM) 6.25-15 MG/5ML syrup Take 5 mLs by mouth 4 (four) times daily as needed. 11/15/22  Yes Eusebio Friendly B, PA-C  albuterol (VENTOLIN HFA) 108 (90 Base) MCG/ACT inhaler Inhale 2 puffs into the lungs every 6 (six) hours as needed. 12/01/20   Reubin Milan, MD  aspirin 81 MG chewable tablet Chew 81 mg by mouth daily.     [provider]  atorvastatin (LIPITOR) 40 MG tablet TAKE 1 TABLET DAILY 01/14/22   Reubin Milan, MD  blood glucose meter kit and supplies KIT Dispense based on patient and insurance preference. Use to check BS bid 04/04/21   Reubin Milan, MD  chlorhexidine (PERIDEX) 0.12 % solution Use as directed 15 mLs in the mouth or throat in the morning, at noon, and at bedtime. 01/02/22   [provider]  Cholecalciferol (VITAMIN D) 50 MCG (2000 UT) CAPS Take 2 capsules by mouth daily.    [provider]  cholestyramine Lanetta Inch) 4 GM/DOSE powder Take 1 packet (4 g total) by mouth 2 (two) times daily with a meal. 09/02/22   Celso Amy, PA-C  clopidogrel (PLAVIX) 75 MG tablet TAKE 1 TABLET BY MOUTH DAILY 05/13/22   Schnier, Latina Craver, MD   Cyanocobalamin (VITAMIN B 12 PO) Take 1,000 mcg by mouth daily. 1000mg  daily    [provider]  diphenoxylate-atropine (LOMOTIL) 2.5-0.025 MG tablet Take 1 tablet by mouth 4 (four) times daily as needed for diarrhea or loose stools. 03/16/22   Sunnie Nielsen, DO  FARXIGA 10 MG TABS tablet TAKE 1 TABLET BY MOUTH DAILY  BEFORE BREAKFAST 10/09/22   Reubin Milan, MD  fexofenadine (ALLEGRA) 180 MG tablet Take 180 mg by mouth daily.    [provider]  lansoprazole (PREVACID) 30 MG capsule Take 30 mg by mouth daily at 12 noon.    [provider]  lisinopril (ZESTRIL) 20 MG tablet TAKE 1 TABLET BY MOUTH DAILY 05/03/22   Reubin Milan, MD  loperamide (IMODIUM A-D) 2 MG tablet Take 2 mg by mouth as needed for diarrhea or loose stools.    [provider]  meclizine (ANTIVERT) 12.5 MG tablet TAKE 1 TABLET(12.5 MG) BY MOUTH TWICE DAILY AS NEEDED FOR DIZZINESS 02/25/22   Reubin Milan, MD  metoprolol tartrate (LOPRESSOR) 50 MG tablet Take 50 mg by mouth 2 (two) times daily.  10/17/20   [provider]  Multiple Vitamin tablet Take 1 tablet by mouth daily.     [provider]  sertraline (ZOLOFT) 50 MG tablet TAKE 1 TABLET BY MOUTH DAILY 10/09/22   Reubin Milan, MD    Family History Family History  Problem Relation Age of Onset   Diabetes Mother    Heart failure Father    Diabetes Father    Breast cancer Neg Hx     Social History Social History   Tobacco Use   Smoking status: Former    Current packs/day: 0.00    Average packs/day: 0.5 packs/day for 46.0 years (23.0 ttl pk-yrs)    Types: Cigarettes    Start date: 04/06/1967    Quit date: 04/05/2013    Years since quitting: 9.6   Smokeless tobacco: Never  Vaping Use   Vaping status: Never Used  Substance Use Topics   Alcohol use: Yes    Alcohol/week: 2.0 standard drinks of alcohol    Types: 2 Standard drinks or equivalent per week   Drug use: Never     Allergies    Augmentin [amoxicillin-pot clavulanate], Levaquin [levofloxacin], Tetracycline, Cefaclor, Cephalexin, Rosuvastatin, and Sulfa antibiotics   Review of Systems Review of Systems  Constitutional:  Positive for fatigue and fever. Negative for chills and diaphoresis.  HENT:  Positive for congestion, rhinorrhea, sore throat and voice change. Negative for ear pain, sinus pressure and sinus pain.   Respiratory:  Positive for cough. Negative for shortness of breath.   Cardiovascular:  Positive for chest pain.  Gastrointestinal:  Negative for abdominal pain, nausea and vomiting.  Musculoskeletal:  Positive for myalgias.  Skin:  Negative for rash.  Neurological:  Negative for weakness and headaches.  Hematological:  Negative for adenopathy.     Physical Exam Triage Vital Signs ED Triage Vitals  Encounter Vitals Group     BP 11/15/22 1528 (!) 158/69     Systolic BP Percentile --      Diastolic BP Percentile --      Pulse Rate 11/15/22 1528 69     Resp 11/15/22 1528 14     Temp 11/15/22 1528 99 F (37.2 C)     Temp Source 11/15/22 1528 Oral     SpO2 11/15/22 1528 100 %     Weight 11/15/22 1527 183 lb 3.2 oz (83.1 kg)     Height 11/15/22 1527 5\' 2"  (1.575 m)     Head Circumference --      Peak Flow --      Pain Score 11/15/22 1526 4     Pain Loc --      Pain Education --      Exclude from Growth Chart --    No data found.  Updated Vital Signs BP (!) 158/69 (BP Location: Left Arm)   Pulse 69   Temp 99 F (37.2 C) (Oral)   Resp 14   Ht 5\' 2"  (1.575 m)   Wt 183 lb 3.2 oz (83.1 kg)   LMP  (LMP Unknown)   SpO2 100%   BMI 33.51 kg/m    Physical Exam Vitals and nursing note reviewed.  Constitutional:      General: She is not in acute distress.    Appearance: Normal appearance. She is ill-appearing. She is not toxic-appearing.  HENT:     Head: Normocephalic and atraumatic.     Right Ear: Tympanic membrane, ear canal and external ear normal.     Left Ear: Tympanic membrane,  ear canal and external ear normal.     Nose: Congestion and rhinorrhea present.     Mouth/Throat:     Mouth: Mucous membranes are moist.     Pharynx: Oropharynx is clear. Posterior oropharyngeal erythema present.  Eyes:     General: No scleral icterus.       Right eye: No discharge.        Left eye: No discharge.     Conjunctiva/sclera: Conjunctivae normal.  Cardiovascular:     Rate and Rhythm: Normal rate and regular rhythm.     Heart sounds: Normal heart sounds.  Pulmonary:     Effort: Pulmonary effort is normal. No respiratory distress.     Breath sounds: Normal breath sounds. No wheezing, rhonchi or rales.  Musculoskeletal:     Cervical back: Neck supple.  Skin:    General: Skin is dry.  Neurological:     General: No focal deficit present.     Mental Status: She is alert. Mental status is at baseline.     Motor: No weakness.     Gait: Gait normal.  Psychiatric:        Mood and Affect: Mood normal.        Behavior: Behavior normal.        Thought Content: Thought content normal.      UC Treatments / Results  Labs (all labs ordered are listed, but only abnormal results are displayed) Labs Reviewed  GROUP A STREP BY PCR  RESP PANEL BY RT-PCR (FLU A&B, COVID) [id number]    EKG   Radiology No results found.  Procedures Procedures (including critical care time)  Medications Ordered in UC Medications - No data to display  Initial Impression / Assessment and Plan / UC Course  I have reviewed the triage vital signs and the nursing notes.  Pertinent labs & imaging results that were available during my care of the patient were reviewed by me and considered in my medical decision making (see chart for details).   72 year old female presents for 2-day history of fatigue, body aches, cough, congestion, sore throat, voice hoarseness, chest pain with breathing and coughing.  Also reports low-grade fever.  Current temp 99 degrees.  She is ill-appearing but nontoxic.  On  exam is nasal congestion with slight yellowish drainage, erythema posterior pharynx.  Chest clear.  Heart regular rate and rhythm.  Respiratory panel and strep obtained.  All negative.  Reviewed results with patient.  Viral URI.  Very low suspicion for pneumonia since she is afebrile, oxygen saturation is at 100%, and lungs are clear.  Supportive care encouraged with increased rest and fluids.  Sent Promethazine DM, Atrovent nasal spray and viscous lidocaine to pharmacy.  Reviewed she should be feeling better in the next 1 to 2 weeks.  Reviewed returning for fever, increased chest pain or shortness of breath.   Final Clinical Impressions(s) / UC Diagnoses   Final diagnoses:  Viral upper respiratory tract infection  Acute cough  Sore throat     Discharge Instructions      -Negative flu, COVID and strep -You have a cold which is due to a virus  URI/COLD SYMPTOMS: Your exam today is consistent with a viral illness. Antibiotics are not indicated at this time. Use medications as directed, including cough syrup, nasal saline, and decongestants. Your symptoms should improve over the next few days and resolve within 7-10 days. Increase rest and fluids. F/u if symptoms worsen or predominate such as sore throat, ear pain, productive  cough, shortness of breath, or if you develop high fevers or worsening fatigue over the next several days.       ED Prescriptions     Medication Sig Dispense Auth. Provider   promethazine-dextromethorphan (PROMETHAZINE-DM) 6.25-15 MG/5ML syrup Take 5 mLs by mouth 4 (four) times daily as needed. 118 mL Eusebio Friendly B, PA-C   lidocaine (XYLOCAINE) 2 % solution Use as directed 15 mLs in the mouth or throat every 3 (three) hours as needed for mouth pain (swish and spit). 100 mL Eusebio Friendly B, PA-C   ipratropium (ATROVENT) 0.06 % nasal spray Place 2 sprays into both nostrils 4 (four) times daily. 15 mL Shirlee Latch, PA-C      PDMP not reviewed this encounter.    Shirlee Latch, PA-C 11/15/22 1627

## 2022-11-19 ENCOUNTER — Ambulatory Visit: Payer: Medicare Other | Admitting: Internal Medicine

## 2022-11-19 ENCOUNTER — Encounter: Payer: Self-pay | Admitting: Internal Medicine

## 2022-11-19 VITALS — BP 122/68 | HR 90 | Temp 97.5°F | Ht 62.0 in | Wt 185.0 lb

## 2022-11-19 DIAGNOSIS — J40 Bronchitis, not specified as acute or chronic: Secondary | ICD-10-CM | POA: Diagnosis not present

## 2022-11-19 DIAGNOSIS — Z87891 Personal history of nicotine dependence: Secondary | ICD-10-CM | POA: Diagnosis not present

## 2022-11-19 MED ORDER — GUAIFENESIN-CODEINE 100-10 MG/5ML PO SYRP: 5.0000 mL | ORAL_SOLUTION | Freq: Three times a day (TID) | ORAL | 0 refills | Status: DC | PRN

## 2022-11-19 MED ORDER — DOXYCYCLINE HYCLATE 100 MG PO TABS: 100.0000 mg | ORAL_TABLET | Freq: Two times a day (BID) | ORAL | 0 refills | Status: AC

## 2022-11-19 NOTE — Progress Notes (Signed)
Date:  11/19/2022   Name:  Misty Villarreal   DOB:  1950/10/08   MRN:  [id number]   Chief Complaint: Cough  Cough This is a new problem. The current episode started in the past 7 days. The problem has been gradually worsening. The problem occurs every few minutes. The cough is Productive of sputum. Associated symptoms include headaches, nasal congestion and postnasal drip. Pertinent negatives include no chest pain, chills, ear pain, fever, shortness of breath or wheezing. She has tried a beta-agonist inhaler, OTC cough suppressant, prescription cough suppressant and steroid inhaler for the symptoms. The treatment provided no relief.  UC 5 days ago - negative Covid, Flu and RSV.  Taking cough syrup and tylenol without improvement.  Cough is productive of green phlegm.  Review of Systems  Constitutional:  Positive for fatigue. Negative for chills and fever.  HENT:  Positive for postnasal drip. Negative for ear pain, sinus pressure and trouble swallowing.   Respiratory:  Positive for cough. Negative for chest tightness, shortness of breath and wheezing.   Cardiovascular:  Negative for chest pain.  Neurological:  Positive for headaches. Negative for dizziness.  Psychiatric/Behavioral:  Positive for sleep disturbance.      Lab Results  Component Value Date   NA 135 05/30/2022   K 5.2 (H) 05/30/2022   CO2 20 (L) 05/30/2022   GLUCOSE 166 (H) 05/30/2022   BUN 32 (H) 05/30/2022   CREATININE 1.59 (H) 05/30/2022   CALCIUM 9.5 05/30/2022   EGFR 30 (L) 03/25/2022   GFRNONAA 34 (L) 05/30/2022   Lab Results  Component Value Date   CHOL 211 (H) 12/07/2021   HDL 47 12/07/2021   LDLCALC 117 (H) 12/07/2021   LDLDIRECT 76 07/14/2020   TRIG 268 (H) 12/07/2021   CHOLHDL 4.5 (H) 12/07/2021   Lab Results  Component Value Date   TSH 3.200 12/07/2021   Lab Results  Component Value Date   HGBA1C 7.3 (A) 07/30/2022   Lab Results  Component Value Date   WBC 7.1 05/30/2022   HGB 12.2  05/30/2022   HCT 36.9 05/30/2022   MCV 89.3 05/30/2022   PLT 300 05/30/2022   Lab Results  Component Value Date   ALT 17 05/30/2022   AST 23 05/30/2022   ALKPHOS 80 05/30/2022   BILITOT 0.8 05/30/2022   Lab Results  Component Value Date   VD25OH 21.79 (L) 09/13/2019     Patient Active Problem List   Diagnosis Date Noted   CKD (chronic kidney disease) stage 3, GFR 30-59 ml/min (HCC) 06/03/2022   Abdominal pain 03/14/2022   HLD (hyperlipidemia) 03/14/2022   Type II diabetes mellitus with renal manifestations (HCC) 03/14/2022   Chronic diastolic CHF (congestive heart failure) (HCC) 03/14/2022   Obesity (BMI 30-39.9) 03/14/2022   Enteritis 03/14/2022   Benign essential tremor 12/07/2021   Coronary artery disease involving native coronary artery of native heart with angina pectoris (HCC) 05/14/2021   Atherosclerosis of native arteries of extremity with intermittent claudication (HCC) 05/15/2020   Palpitations 03/29/2020   Stable angina pectoris (HCC) 03/29/2020   Aortic atherosclerosis (HCC) 11/19/2019   Other fatigue 09/07/2019   Stage 3b chronic kidney disease (HCC) 12/23/2018   Restless leg syndrome 12/23/2018   Unruptured synovial cyst of popliteal space 12/23/2018   Plantar fasciitis of left foot 12/23/2018   B12 deficiency 11/26/2018   Hypogammaglobulinemia (HCC) 11/11/2018   Anemia due to stage 3b chronic kidney disease (HCC) 11/11/2018   Osteopenia determined by x-ray 02/16/2018  Major depressive disorder with single episode, in partial remission (HCC) 08/04/2017   Neck pain on right side 08/05/2016   Tinnitus of right ear 08/05/2016   Type II diabetes mellitus with complication (HCC) 08/03/2015   Lymphedema 06/21/2015   History of paroxysmal supraventricular tachycardia 06/21/2015   PAD (peripheral artery disease) (HCC) 02/02/2015   Hyperlipidemia associated with type 2 diabetes mellitus (HCC) 09/01/2014   Neuropathy 09/01/2014   Phlebectasia 09/01/2014    Essential (primary) hypertension 09/01/2014   Spondylolisthesis at L4-L5 level 07/05/2014   Arthritis, degenerative 01/31/2014    Allergies  Allergen Reactions   Augmentin [Amoxicillin-Pot Clavulanate] Diarrhea   Levaquin [Levofloxacin] Nausea And Vomiting and Other (See Comments)    Ankle pain   Tetracycline     Other reaction(s): emesis   Cefaclor Rash   Cephalexin Rash   Rosuvastatin Diarrhea   Sulfa Antibiotics Rash    Other reaction(s): emesis    Past Surgical History:  Procedure Laterality Date   ANGIOPLASTY / STENTING FEMORAL Left 2014, 2013   BRAIN MENINGIOMA EXCISION  1988   BREAST BIOPSY Right 1989   benign   CARPAL TUNNEL RELEASE Left 1980   CATARACT EXTRACTION W/PHACO Right 09/02/2019   Procedure: CATARACT EXTRACTION PHACO AND INTRAOCULAR LENS PLACEMENT (IOC);  Surgeon: Nevada Crane, MD;  Location: [facility] ORS;  Service: Ophthalmology;  Laterality: Right;  [place] 00:27.3 CDE 1.92 AP% Fluid Pack lot # [id number]   CATARACT EXTRACTION W/PHACO Left 10/18/2019   Procedure: CATARACT EXTRACTION PHACO AND INTRAOCULAR LENS PLACEMENT (IOC) LEFT DIABETIC 2.55  00:28.5;  Surgeon: Nevada Crane, MD;  Location: [facility];  Service: Ophthalmology;  Laterality: Left;  Diabetic - oral meds   CESAREAN SECTION     CHOLECYSTECTOMY  1987   COLONOSCOPY WITH PROPOFOL N/A 05/08/2017   Procedure: COLONOSCOPY WITH PROPOFOL;  Surgeon: Wyline Mood, MD;  Location: [facility];  Service: Gastroenterology;  Laterality: N/A;   KNEE ARTHROPLASTY Right 2009   LOWER EXTREMITY ANGIOGRAPHY Left 07/21/2018   Procedure: LOWER EXTREMITY ANGIOGRAPHY;  Surgeon: Renford Dills, MD;  Location: [facility] INV[facility];  Service: Cardiovascular;  Laterality: Left;    Social History   Tobacco Use   Smoking status: Former    Current packs/day: 0.00    Average packs/day: 0.5 packs/day for 46.0 years (23.0 ttl pk-yrs)    Types: Cigarettes    Start date: 04/06/1967    Quit date: 04/05/2013     Years since quitting: 9.6   Smokeless tobacco: Never  Vaping Use   Vaping status: Never Used  Substance Use Topics   Alcohol use: Yes    Alcohol/week: 2.0 standard drinks of alcohol    Types: 2 Standard drinks or equivalent per week   Drug use: Never     Medication list has been reviewed and updated.  Current Meds  Medication Sig   albuterol (VENTOLIN HFA) 108 (90 Base) MCG/ACT inhaler Inhale 2 puffs into the lungs every 6 (six) hours as needed.   aspirin 81 MG chewable tablet Chew 81 mg by mouth daily.    atorvastatin (LIPITOR) 40 MG tablet TAKE 1 TABLET DAILY   blood glucose meter kit and supplies KIT Dispense based on patient and insurance preference. Use to check BS bid   chlorhexidine (PERIDEX) 0.12 % solution Use as directed 15 mLs in the mouth or throat in the morning, at noon, and at bedtime.   Cholecalciferol (VITAMIN D) 50 MCG (2000 UT) CAPS Take 2 capsules by mouth daily.   cholestyramine Lanetta Inch)  4 GM/DOSE powder Take 1 packet (4 g total) by mouth 2 (two) times daily with a meal.   clopidogrel (PLAVIX) 75 MG tablet TAKE 1 TABLET BY MOUTH DAILY   Cyanocobalamin (VITAMIN B 12 PO) Take 1,000 mcg by mouth daily. 1000mg  daily   diphenoxylate-atropine (LOMOTIL) 2.5-0.025 MG tablet Take 1 tablet by mouth 4 (four) times daily as needed for diarrhea or loose stools.   doxycycline (VIBRA-TABS) 100 MG tablet Take 1 tablet (100 mg total) by mouth 2 (two) times daily for 10 days.   FARXIGA 10 MG TABS tablet TAKE 1 TABLET BY MOUTH DAILY  BEFORE BREAKFAST   fexofenadine (ALLEGRA) 180 MG tablet Take 180 mg by mouth daily.   guaiFENesin-codeine (ROBITUSSIN AC) 100-10 MG/5ML syrup Take 5 mLs by mouth 3 (three) times daily as needed for cough.   ipratropium (ATROVENT) 0.06 % nasal spray Place 2 sprays into both nostrils 4 (four) times daily.   lansoprazole (PREVACID) 30 MG capsule Take 30 mg by mouth daily at 12 noon.   lidocaine (XYLOCAINE) 2 % solution Use as directed 15 mLs in the mouth  or throat every 3 (three) hours as needed for mouth pain (swish and spit).   lisinopril (ZESTRIL) 20 MG tablet TAKE 1 TABLET BY MOUTH DAILY   loperamide (IMODIUM A-D) 2 MG tablet Take 2 mg by mouth as needed for diarrhea or loose stools.   meclizine (ANTIVERT) 12.5 MG tablet TAKE 1 TABLET(12.5 MG) BY MOUTH TWICE DAILY AS NEEDED FOR DIZZINESS   metoprolol tartrate (LOPRESSOR) 50 MG tablet Take 50 mg by mouth 2 (two) times daily.   Multiple Vitamin tablet Take 1 tablet by mouth daily.    sertraline (ZOLOFT) 50 MG tablet TAKE 1 TABLET BY MOUTH DAILY   [DISCONTINUED] promethazine-dextromethorphan (PROMETHAZINE-DM) 6.25-15 MG/5ML syrup Take 5 mLs by mouth 4 (four) times daily as needed.       07/30/2022    9:35 AM 03/25/2022   10:51 AM 12/07/2021   10:01 AM 08/06/2021    2:57 PM  GAD 7 : Generalized Anxiety Score  Nervous, Anxious, on Edge 0 0 0 0  Control/stop worrying 0 0 0 0  Worry too much - different things 0 0 0 0  Trouble relaxing 0 0 0 0  Restless 0 0 0 0  Easily annoyed or irritable 0 0 0 0  Afraid - awful might happen 0 0 0 0  Total GAD 7 Score 0 0 0 0  Anxiety Difficulty Not difficult at all Not difficult at all Not difficult at all Not difficult at all       07/30/2022    9:35 AM 03/25/2022   10:51 AM 12/07/2021   10:01 AM  Depression screen PHQ 2/9  Decreased Interest 0 0 0  Down, Depressed, Hopeless 0 0 0  PHQ - 2 Score 0 0 0  Altered sleeping 0 0 0  Tired, decreased energy 0 0 0  Change in appetite 0 0 0  Feeling bad or failure about yourself  0 0 0  Trouble concentrating 0 0 0  Moving slowly or fidgety/restless 0 0 0  Suicidal thoughts 0 0 0  PHQ-9 Score 0 0 0  Difficult doing work/chores Not difficult at all Not difficult at all Not difficult at all    BP Readings from Last 3 Encounters:  11/19/22 122/68  11/15/22 (!) 158/69  09/02/22 134/71    Physical Exam Vitals and nursing note reviewed.  Constitutional:      General: She is  not in acute distress.     Appearance: Normal appearance. She is well-developed. She is not ill-appearing.  HENT:     Head: Normocephalic and atraumatic.  Cardiovascular:     Rate and Rhythm: Normal rate and regular rhythm.  Pulmonary:     Effort: Pulmonary effort is normal. No respiratory distress.     Breath sounds: No wheezing or rhonchi.     Comments: Constant loose cough Musculoskeletal:     Cervical back: Normal range of motion.  Lymphadenopathy:     Cervical: No cervical adenopathy.  Skin:    General: Skin is warm and dry.     Findings: No rash.  Neurological:     Mental Status: She is alert and oriented to person, place, and time.  Psychiatric:        Mood and Affect: Mood normal.        Behavior: Behavior normal.     Wt Readings from Last 3 Encounters:  11/19/22 185 lb (83.9 kg)  11/15/22 183 lb 3.2 oz (83.1 kg)  09/02/22 183 lb 3.2 oz (83.1 kg)    BP 122/68   Pulse 90   Temp (!) 97.5 F (36.4 C) (Oral)   Ht 5\' 2"  (1.575 m)   Wt 185 lb (83.9 kg)   LMP  (LMP Unknown)   SpO2 98%   BMI 33.84 kg/m   Assessment and Plan:  Problem List Items Addressed This Visit   None Visit Diagnoses     Bronchitis    -  Primary   continue Allegra, fluids, Flonase add Benadryl for PND Albuterol MDI qid   Relevant Medications   guaiFENesin-codeine (ROBITUSSIN AC) 100-10 MG/5ML syrup   doxycycline (VIBRA-TABS) 100 MG tablet       No follow-ups on file.    Reubin Milan, MD [facility] [place]

## 2022-11-19 NOTE — Patient Instructions (Signed)
Continue Allegra and Flonase.  Drink plenty of fluids.  Try taking Benadryl 25 mg - one half or one several times a day if it helps.  Albuterol inhaler four times per day.

## 2022-11-28 LAB — CBC AND DIFFERENTIAL
HCT: 37 (ref 36–46)
Hemoglobin: 11.9 — AB (ref 12.0–16.0)
Platelets: 355 10*3/uL (ref 150–400)
WBC: 8.6

## 2022-12-06 LAB — HM DIABETES EYE EXAM

## 2023-01-23 ENCOUNTER — Encounter: Payer: Self-pay | Admitting: Internal Medicine

## 2023-01-23 ENCOUNTER — Ambulatory Visit (INDEPENDENT_AMBULATORY_CARE_PROVIDER_SITE_OTHER): Payer: Medicare Other | Admitting: Internal Medicine

## 2023-01-23 ENCOUNTER — Other Ambulatory Visit: Payer: Self-pay | Admitting: Internal Medicine

## 2023-01-23 VITALS — BP 128/70 | HR 68 | Ht 62.0 in | Wt 190.0 lb

## 2023-01-23 DIAGNOSIS — E1169 Type 2 diabetes mellitus with other specified complication: Secondary | ICD-10-CM

## 2023-01-23 DIAGNOSIS — Z7984 Long term (current) use of oral hypoglycemic drugs: Secondary | ICD-10-CM

## 2023-01-23 DIAGNOSIS — I5032 Chronic diastolic (congestive) heart failure: Secondary | ICD-10-CM | POA: Diagnosis not present

## 2023-01-23 DIAGNOSIS — I10 Essential (primary) hypertension: Secondary | ICD-10-CM | POA: Diagnosis not present

## 2023-01-23 DIAGNOSIS — F324 Major depressive disorder, single episode, in partial remission: Secondary | ICD-10-CM | POA: Diagnosis not present

## 2023-01-23 DIAGNOSIS — Z1231 Encounter for screening mammogram for malignant neoplasm of breast: Secondary | ICD-10-CM

## 2023-01-23 DIAGNOSIS — E118 Type 2 diabetes mellitus with unspecified complications: Secondary | ICD-10-CM

## 2023-01-23 DIAGNOSIS — Z0000 Encounter for general adult medical examination without abnormal findings: Secondary | ICD-10-CM

## 2023-01-23 DIAGNOSIS — E785 Hyperlipidemia, unspecified: Secondary | ICD-10-CM

## 2023-01-23 DIAGNOSIS — E559 Vitamin D deficiency, unspecified: Secondary | ICD-10-CM

## 2023-01-23 DIAGNOSIS — I70229 Atherosclerosis of native arteries of extremities with rest pain, unspecified extremity: Secondary | ICD-10-CM | POA: Diagnosis not present

## 2023-01-23 DIAGNOSIS — Z23 Encounter for immunization: Secondary | ICD-10-CM | POA: Diagnosis not present

## 2023-01-23 DIAGNOSIS — F17201 Nicotine dependence, unspecified, in remission: Secondary | ICD-10-CM

## 2023-01-23 MED ORDER — ATORVASTATIN CALCIUM 40 MG PO TABS: 40.0000 mg | ORAL_TABLET | Freq: Every day | ORAL | 3 refills | Status: DC

## 2023-01-23 MED ORDER — MECLIZINE HCL 12.5 MG PO TABS: 12.5000 mg | ORAL_TABLET | Freq: Two times a day (BID) | ORAL | 1 refills | Status: DC | PRN

## 2023-01-23 NOTE — Assessment & Plan Note (Signed)
 Chronic stable GFR - followed by Nephrology Seen in November with stable CBC, PTH elevated 133, GFR 36

## 2023-01-23 NOTE — Assessment & Plan Note (Signed)
 Blood sugars stable without hypoglycemic symptoms or events. Currently managed with [place]. Changes made last visit are none. Lab Results  Component Value Date   HGBA1C 7.3 (A) 07/30/2022

## 2023-01-23 NOTE — Assessment & Plan Note (Signed)
 Clinically stable on Sertraline with good response, No SI or HI reported. No change in management at this time.

## 2023-01-23 NOTE — Progress Notes (Signed)
 Date:  01/23/2023   Name:  Misty Villarreal   DOB:  Jul 17, 1950   MRN:  [id number]   Chief Complaint: Annual Exam Misty Villarreal is a 73 y.o. female who presents today for her Complete Annual Exam. She feels well. She reports exercising- some. She reports she is sleeping well. Breast complaints - none.  Mammogram: 02/2022 DEXA: 01/2018 Colonoscopy: 04/2017 repeat 10 yrs  Health Maintenance Due  Topic Date Due   DTaP/Tdap/Td (1 - Tdap) Never done   Lung Cancer Screening  11/15/2020   COVID-19 Vaccine (3 - 2024-25 season) 09/22/2022   Medicare Annual Wellness (AWV)  11/15/2022   OPHTHALMOLOGY EXAM  12/01/2022    Immunization History  Administered Date(s) Administered   Fluad Quad(high Dose 65+) 10/23/2020, 12/07/2021   Fluad Trivalent(High Dose 65+) 01/23/2023   Influenza, High Dose Seasonal PF 11/13/2016, 11/12/2018   Influenza-Unspecified 12/18/2017, 11/22/2019   PFIZER(Purple Top)SARS-COV-2 Vaccination 03/24/2019, 04/14/2019   Pneumococcal Conjugate-13 08/04/2015   Pneumococcal Polysaccharide-23 12/15/2012, 09/09/2018   Zoster Recombinant(Shingrix) 12/04/2017, 11/12/2018     Hypertension This is a chronic problem. The problem is controlled. Pertinent negatives include no chest pain, headaches, palpitations or shortness of breath. Past treatments include beta blockers and ACE inhibitors. The current treatment provides significant improvement. Hypertensive end-organ damage includes kidney disease and CAD/MI. There is no history of CVA.  Diabetes Pertinent negatives for hypoglycemia include no dizziness, headaches or nervousness/anxiousness. Pertinent negatives for diabetes include no chest pain, no fatigue and no weakness. Pertinent negatives for diabetic complications include no CVA.  Hyperlipidemia This is a chronic problem. The problem is controlled. Pertinent negatives include no chest pain or shortness of breath. Current antihyperlipidemic treatment includes statins. The  current treatment provides significant improvement of lipids.    Review of Systems  Constitutional:  Negative for fatigue and unexpected weight change.  HENT:  Negative for nosebleeds.   Eyes:  Negative for visual disturbance.  Respiratory:  Negative for cough, chest tightness, shortness of breath and wheezing.   Cardiovascular:  Negative for chest pain, palpitations and leg swelling.  Gastrointestinal:  Positive for diarrhea (intermittent). Negative for abdominal pain and constipation.  Genitourinary:  Negative for dysuria and hematuria.  Musculoskeletal:  Negative for arthralgias.  Skin:  Negative for rash.  Neurological:  Negative for dizziness, weakness, light-headedness and headaches.  Psychiatric/Behavioral:  Negative for dysphoric mood and sleep disturbance. The patient is not nervous/anxious.      Lab Results  Component Value Date   NA 135 05/30/2022   K 5.2 (H) 05/30/2022   CO2 20 (L) 05/30/2022   GLUCOSE 166 (H) 05/30/2022   BUN 32 (H) 05/30/2022   CREATININE 1.59 (H) 05/30/2022   CALCIUM  9.5 05/30/2022   EGFR 30 (L) 03/25/2022   GFRNONAA 34 (L) 05/30/2022   Lab Results  Component Value Date   CHOL 211 (H) 12/07/2021   HDL 47 12/07/2021   LDLCALC 117 (H) 12/07/2021   LDLDIRECT 76 07/14/2020   TRIG 268 (H) 12/07/2021   CHOLHDL 4.5 (H) 12/07/2021   Lab Results  Component Value Date   TSH 3.200 12/07/2021   Lab Results  Component Value Date   HGBA1C 7.3 (A) 07/30/2022   Lab Results  Component Value Date   WBC 8.6 11/28/2022   HGB 11.9 (A) 11/28/2022   HCT 37 11/28/2022   MCV 89.3 05/30/2022   PLT 355 11/28/2022   Lab Results  Component Value Date   ALT 17 05/30/2022   AST 23 05/30/2022  ALKPHOS 80 05/30/2022   BILITOT 0.8 05/30/2022   Lab Results  Component Value Date   VD25OH 21.79 (L) 09/13/2019     Patient Active Problem List   Diagnosis Date Noted   Atherosclerosis of artery of extremity with rest pain (HCC) 01/23/2023   Type II  diabetes mellitus with renal manifestations (HCC) 03/14/2022   Chronic diastolic CHF (congestive heart failure) (HCC) 03/14/2022   Obesity (BMI 30-39.9) 03/14/2022   Enteritis 03/14/2022   Benign essential tremor 12/07/2021   Coronary artery disease involving native coronary artery of native heart with angina pectoris (HCC) 05/14/2021   Atherosclerosis of native arteries of extremity with intermittent claudication (HCC) 05/15/2020   Palpitations 03/29/2020   Stable angina pectoris (HCC) 03/29/2020   Aortic atherosclerosis (HCC) 11/19/2019   Stage 3b chronic kidney disease (HCC) 12/23/2018   Restless leg syndrome 12/23/2018   Unruptured synovial cyst of popliteal space 12/23/2018   Plantar fasciitis of left foot 12/23/2018   B12 deficiency 11/26/2018   Hypogammaglobulinemia (HCC) 11/11/2018   Anemia due to stage 3b chronic kidney disease (HCC) 11/11/2018   Osteopenia determined by x-ray 02/16/2018   Major depressive disorder with single episode, in partial remission (HCC) 08/04/2017   Tinnitus of right ear 08/05/2016   Type II diabetes mellitus with complication (HCC) 08/03/2015   Lymphedema 06/21/2015   History of paroxysmal supraventricular tachycardia 06/21/2015   PAD (peripheral artery disease) (HCC) 02/02/2015   Hyperlipidemia associated with type 2 diabetes mellitus (HCC) 09/01/2014   Neuropathy 09/01/2014   Phlebectasia 09/01/2014   Essential (primary) hypertension 09/01/2014   Spondylolisthesis at L4-L5 level 07/05/2014   Arthritis, degenerative 01/31/2014    Allergies  Allergen Reactions   Augmentin  [Amoxicillin -Pot Clavulanate] Diarrhea   Levaquin  [Levofloxacin ] Nausea And Vomiting and Other (See Comments)    Ankle pain   Tetracycline     Other reaction(s): emesis   Cefaclor Rash   Cephalexin Rash   Rosuvastatin  Diarrhea   Sulfa Antibiotics Rash    Other reaction(s): emesis    Past Surgical History:  Procedure Laterality Date   ANGIOPLASTY / STENTING FEMORAL  Left 2014, 2013   BRAIN MENINGIOMA EXCISION  1988   BREAST BIOPSY Right 1989   benign   CARPAL TUNNEL RELEASE Left 1980   CATARACT EXTRACTION W/PHACO Right 09/02/2019   Procedure: CATARACT EXTRACTION PHACO AND INTRAOCULAR LENS PLACEMENT (IOC);  Surgeon: Myrna Adine Anes, MD;  Location: [facility];  Service: Ophthalmology;  Laterality: Right;  US  00:27.3 CDE 1.92 AP% Fluid Pack lot # [id number]   CATARACT EXTRACTION W/PHACO Left 10/18/2019   Procedure: CATARACT EXTRACTION PHACO AND INTRAOCULAR LENS PLACEMENT (IOC) LEFT DIABETIC 2.55  00:28.5;  Surgeon: Myrna Adine Anes, MD;  Location: [facility];  Service: Ophthalmology;  Laterality: Left;  Diabetic - oral meds   CESAREAN SECTION     CHOLECYSTECTOMY  1987   COLONOSCOPY WITH PROPOFOL  N/A 05/08/2017   Procedure: COLONOSCOPY WITH PROPOFOL ;  Surgeon: Therisa Bi, MD;  Location: [facility];  Service: Gastroenterology;  Laterality: N/A;   KNEE ARTHROPLASTY Right 2009   LOWER EXTREMITY ANGIOGRAPHY Left 07/21/2018   Procedure: LOWER EXTREMITY ANGIOGRAPHY;  Surgeon: Jama Cordella MATSU, MD;  Location: [facility];  Service: Cardiovascular;  Laterality: Left;    Social History   Tobacco Use   Smoking status: Former    Current packs/day: 0.00    Average packs/day: 0.5 packs/day for 46.0 years (23.0 ttl pk-yrs)    Types: Cigarettes    Start date: 04/06/1967    Quit date:  04/05/2013    Years since quitting: 9.8   Smokeless tobacco: Never  Vaping Use   Vaping status: Never Used  Substance Use Topics   Alcohol use: Yes    Alcohol/week: 2.0 standard drinks of alcohol    Types: 2 Standard drinks or equivalent per week   Drug use: Never     Medication list has been reviewed and updated.  Current Meds  Medication Sig   albuterol  (VENTOLIN  HFA) 108 (90 Base) MCG/ACT inhaler Inhale 2 puffs into the lungs every 6 (six) hours as needed.   aspirin  81 MG chewable tablet Chew 81 mg by mouth daily.    blood glucose meter kit and  supplies KIT Dispense based on patient and insurance preference. Use to check BS bid   chlorhexidine (PERIDEX) 0.12 % solution Use as directed 15 mLs in the mouth or throat in the morning, at noon, and at bedtime.   Cholecalciferol  (VITAMIN D ) 50 MCG (2000 UT) CAPS Take 2 capsules by mouth daily.   cholestyramine  (QUESTRAN ) 4 GM/DOSE powder Take 1 packet (4 g total) by mouth 2 (two) times daily with a meal.   clopidogrel  (PLAVIX ) 75 MG tablet TAKE 1 TABLET BY MOUTH DAILY   Cyanocobalamin  (VITAMIN B 12 PO) Take 1,000 mcg by mouth daily. 1000mg  daily   diphenoxylate -atropine  (LOMOTIL ) 2.5-0.025 MG tablet Take 1 tablet by mouth 4 (four) times daily as needed for diarrhea or loose stools.   FARXIGA  10 MG TABS tablet TAKE 1 TABLET BY MOUTH DAILY  BEFORE BREAKFAST   fexofenadine (ALLEGRA) 180 MG tablet Take 180 mg by mouth daily.   ipratropium (ATROVENT ) 0.06 % nasal spray Place 2 sprays into both nostrils 4 (four) times daily.   lansoprazole (PREVACID) 30 MG capsule Take 30 mg by mouth daily at 12 noon.   lidocaine  (XYLOCAINE ) 2 % solution Use as directed 15 mLs in the mouth or throat every 3 (three) hours as needed for mouth pain (swish and spit).   lisinopril  (ZESTRIL ) 20 MG tablet TAKE 1 TABLET BY MOUTH DAILY   loperamide (IMODIUM A-D) 2 MG tablet Take 2 mg by mouth as needed for diarrhea or loose stools.   metoprolol  tartrate (LOPRESSOR ) 50 MG tablet Take 50 mg by mouth 2 (two) times daily.   Multiple Vitamin tablet Take 1 tablet by mouth daily.    sertraline  (ZOLOFT ) 50 MG tablet TAKE 1 TABLET BY MOUTH DAILY   [DISCONTINUED] atorvastatin  (LIPITOR) 40 MG tablet TAKE 1 TABLET DAILY   [DISCONTINUED] meclizine  (ANTIVERT ) 12.5 MG tablet TAKE 1 TABLET(12.5 MG) BY MOUTH TWICE DAILY AS NEEDED FOR DIZZINESS       01/23/2023   11:09 AM 07/30/2022    9:35 AM 03/25/2022   10:51 AM 12/07/2021   10:01 AM  GAD 7 : Generalized Anxiety Score  Nervous, Anxious, on Edge 0 0 0 0  Control/stop worrying 0 0 0 0   Worry too much - different things 0 0 0 0  Trouble relaxing 0 0 0 0  Restless 0 0 0 0  Easily annoyed or irritable 0 0 0 0  Afraid - awful might happen 0 0 0 0  Total GAD 7 Score 0 0 0 0  Anxiety Difficulty Not difficult at all Not difficult at all Not difficult at all Not difficult at all       01/23/2023   11:09 AM 07/30/2022    9:35 AM 03/25/2022   10:51 AM  Depression screen PHQ 2/9  Decreased Interest 0 0 0  Down, Depressed, Hopeless 0 0 0  PHQ - 2 Score 0 0 0  Altered sleeping 0 0 0  Tired, decreased energy 0 0 0  Change in appetite 0 0 0  Feeling bad or failure about yourself  0 0 0  Trouble concentrating 0 0 0  Moving slowly or fidgety/restless 0 0 0  Suicidal thoughts 0 0 0  PHQ-9 Score 0 0 0  Difficult doing work/chores Not difficult at all Not difficult at all Not difficult at all    BP Readings from Last 3 Encounters:  01/23/23 128/70  11/19/22 122/68  11/15/22 (!) 158/69    Physical Exam Vitals and nursing note reviewed.  Constitutional:      General: She is not in acute distress.    Appearance: She is well-developed.  HENT:     Head: Normocephalic and atraumatic.     Right Ear: Tympanic membrane and ear canal normal.     Left Ear: Tympanic membrane and ear canal normal.     Nose:     Right Sinus: No maxillary sinus tenderness.     Left Sinus: No maxillary sinus tenderness.  Eyes:     General: No scleral icterus.       Right eye: No discharge.        Left eye: No discharge.     Conjunctiva/sclera: Conjunctivae normal.  Neck:     Thyroid : No thyromegaly.     Vascular: No carotid bruit.  Cardiovascular:     Rate and Rhythm: Normal rate and regular rhythm.     Pulses: Normal pulses.     Heart sounds: Normal heart sounds.  Pulmonary:     Effort: Pulmonary effort is normal. No respiratory distress.     Breath sounds: No wheezing.  Chest:  Breasts:    Right: No mass, nipple discharge, skin change or tenderness.     Left: No mass, nipple discharge,  skin change or tenderness.  Abdominal:     General: Bowel sounds are normal.     Palpations: Abdomen is soft.     Tenderness: There is no abdominal tenderness.  Musculoskeletal:     Cervical back: Normal range of motion. No erythema.     Right lower leg: No edema.     Left lower leg: No edema.  Lymphadenopathy:     Cervical: No cervical adenopathy.  Skin:    General: Skin is warm and dry.     Findings: No rash.  Neurological:     Mental Status: She is alert and oriented to person, place, and time.     Cranial Nerves: No cranial nerve deficit.     Sensory: No sensory deficit.     Deep Tendon Reflexes: Reflexes are normal and symmetric.  Psychiatric:        Attention and Perception: Attention normal.        Mood and Affect: Mood normal.    Diabetic Foot Exam - Simple   Simple Foot Form Diabetic Foot exam was performed with the following findings: Yes 01/23/2023 11:53 AM  Visual Inspection No deformities, no ulcerations, no other skin breakdown bilaterally: Yes Sensation Testing Intact to touch and monofilament testing bilaterally: Yes Pulse Check See comments: Yes Comments DP and PT non palpable.      Wt Readings from Last 3 Encounters:  01/23/23 190 lb (86.2 kg)  11/19/22 185 lb (83.9 kg)  11/15/22 183 lb 3.2 oz (83.1 kg)    BP 128/70   Pulse 68   Ht 5'  2 (1.575 m)   Wt 190 lb (86.2 kg)   LMP  (LMP Unknown)   SpO2 97%   BMI 34.75 kg/m   Assessment and Plan:  Problem List Items Addressed This Visit       Unprioritized   Hyperlipidemia associated with type 2 diabetes mellitus (HCC) (Chronic)   LDL is  Lab Results  Component Value Date   LDLCALC 117 (H) 12/07/2021   Current regimen is atorvastatin .  Tolerating medications well without issues.       Relevant Medications   atorvastatin  (LIPITOR) 40 MG tablet   Other Relevant Orders   Lipid panel   Essential (primary) hypertension (Chronic)   Controlled BP with normal exam. Current regimen is  lisinopril  and metoprolol . Will continue same medications; encourage continued reduced sodium diet.       Relevant Medications   atorvastatin  (LIPITOR) 40 MG tablet   Other Relevant Orders   TSH   Type II diabetes mellitus with complication (HCC) (Chronic)   Blood sugars stable without hypoglycemic symptoms or events. Currently managed with Farxiga . Changes made last visit are none. Lab Results  Component Value Date   HGBA1C 7.3 (A) 07/30/2022         Relevant Medications   atorvastatin  (LIPITOR) 40 MG tablet   Other Relevant Orders   Comprehensive metabolic panel   Hemoglobin A1c   Microalbumin / creatinine urine ratio   Major depressive disorder with single episode, in partial remission (HCC) (Chronic)   Clinically stable on Sertraline  with good response, No SI or HI reported. No change in management at this time.       Atherosclerosis of artery of extremity with rest pain (HCC)   Followed by [facility] - no change in chronic symptoms On Plavix .      Relevant Medications   atorvastatin  (LIPITOR) 40 MG tablet   Other Visit Diagnoses       Annual physical exam    -  Primary   LDCT screening referral will get Flu vaccine she will schedule mammogram     Encounter for screening mammogram for breast cancer       Relevant Orders   MM 3D SCREENING MAMMOGRAM BILATERAL BREAST     Vitamin D  deficiency       Relevant Orders   VITAMIN D  25 Hydroxy (Vit-D Deficiency, Fractures)     Long term current use of oral hypoglycemic drug         Chronic diastolic (congestive) heart failure (HCC)   (Chronic)     Relevant Medications   atorvastatin  (LIPITOR) 40 MG tablet     Need for influenza vaccination       Relevant Orders   Flu Vaccine Trivalent High Dose (Fluad) (Completed)       Return in about 4 months (around 05/23/2023) for DM, HTN.    Leita HILARIO Adie, MD [facility] [place]

## 2023-01-23 NOTE — Assessment & Plan Note (Signed)
 LDL is  Lab Results  Component Value Date   LDLCALC 117 (H) 12/07/2021   Current regimen is atorvastatin.  Tolerating medications well without issues.

## 2023-01-23 NOTE — Assessment & Plan Note (Signed)
 Followed by [facility] - no change in chronic symptoms On Plavix.

## 2023-01-23 NOTE — Patient Instructions (Signed)
 Call [facility] to schedule your mammogram at [phone].

## 2023-01-23 NOTE — Assessment & Plan Note (Signed)
 Controlled BP with normal exam. Current regimen is lisinopril and metoprolol. Will continue same medications; encourage continued reduced sodium diet.

## 2023-01-24 LAB — LIPID PANEL
Chol/HDL Ratio: 4.9 {ratio} — ABNORMAL HIGH (ref 0.0–4.4)
Cholesterol, Total: 243 mg/dL — ABNORMAL HIGH (ref 100–199)
HDL: 50 mg/dL (ref >39)
LDL Chol Calc (NIH): 141 mg/dL — ABNORMAL HIGH (ref 0–99)
Triglycerides: 285 mg/dL — ABNORMAL HIGH (ref 0–149)
VLDL Cholesterol Cal: 52 mg/dL — ABNORMAL HIGH (ref 5–40)

## 2023-01-24 LAB — MICROALBUMIN / CREATININE URINE RATIO
Creatinine, Urine: 94.4 mg/dL
Microalb/Creat Ratio: 15 mg/g{creat} (ref 0–29)
Microalbumin, Urine: 14.1 ug/mL

## 2023-01-24 LAB — COMPREHENSIVE METABOLIC PANEL
ALT: 18 [IU]/L (ref 0–32)
AST: 18 [IU]/L (ref 0–40)
Albumin: 4.6 g/dL (ref 3.8–4.8)
Alkaline Phosphatase: 113 [IU]/L (ref 44–121)
BUN/Creatinine Ratio: 20 (ref 12–28)
BUN: 31 mg/dL — ABNORMAL HIGH (ref 8–27)
Bilirubin Total: 0.7 mg/dL (ref 0.0–1.2)
CO2: 20 mmol/L (ref 20–29)
Calcium: 8.9 mg/dL (ref 8.7–10.3)
Chloride: 99 mmol/L (ref 96–106)
Creatinine, Ser: 1.53 mg/dL — ABNORMAL HIGH (ref 0.57–1.00)
Globulin, Total: 2.2 g/dL (ref 1.5–4.5)
Glucose: 188 mg/dL — ABNORMAL HIGH (ref 70–99)
Potassium: 4.7 mmol/L (ref 3.5–5.2)
Sodium: 137 mmol/L (ref 134–144)
Total Protein: 6.8 g/dL (ref 6.0–8.5)
eGFR: 36 mL/min/{1.73_m2} — ABNORMAL LOW (ref >59)

## 2023-01-24 LAB — VITAMIN D 25 HYDROXY (VIT D DEFICIENCY, FRACTURES): Vit D, 25-Hydroxy: 33.2 ng/mL (ref 30.0–100.0)

## 2023-01-24 LAB — HEMOGLOBIN A1C
Est. average glucose Bld gHb Est-mCnc: 192 mg/dL
Hgb A1c MFr Bld: 8.3 % — ABNORMAL HIGH (ref 4.8–5.6)

## 2023-01-24 LAB — TSH: TSH: 5.13 u[IU]/mL — ABNORMAL HIGH (ref 0.450–4.500)

## 2023-02-06 ENCOUNTER — Encounter: Payer: Self-pay | Admitting: Internal Medicine

## 2023-03-06 ENCOUNTER — Ambulatory Visit: Payer: Medicare Other

## 2023-03-12 ENCOUNTER — Telehealth: Payer: Self-pay | Admitting: Internal Medicine

## 2023-03-12 ENCOUNTER — Ambulatory Visit: Payer: Medicare Other

## 2023-03-12 NOTE — Telephone Encounter (Signed)
 Copied from CRM [id number]. Topic: Medicare AWV >> Mar 12, 2023 10:54 AM Payton Doughty wrote: Reason for CRM: LVM 03/12/23 to r/s time from 840am to 310 pm due to weather khc. Please confirm time change  Verlee Rossetti; Care Guide Ambulatory Clinical Support [place] [facility] Direct Dial: [phone]

## 2023-03-13 ENCOUNTER — Ambulatory Visit (INDEPENDENT_AMBULATORY_CARE_PROVIDER_SITE_OTHER): Payer: Medicare Other | Admitting: Emergency Medicine

## 2023-03-13 VITALS — Ht 62.0 in | Wt 182.0 lb

## 2023-03-13 DIAGNOSIS — Z0000 Encounter for general adult medical examination without abnormal findings: Secondary | ICD-10-CM

## 2023-03-13 DIAGNOSIS — H9190 Unspecified hearing loss, unspecified ear: Secondary | ICD-10-CM

## 2023-03-13 NOTE — Patient Instructions (Addendum)
 Misty Villarreal , Thank you for taking time to come for your Medicare Wellness Visit. I appreciate your ongoing commitment to your health goals. Please review the following plan we discussed and let me know if I can assist you in the future.   Referrals/Orders/Follow-Ups/Clinician Recommendations: I have placed a referral to ENT for a hearing evaluation. Someone from their office will call you to schedule appointment. Complete the advanced directives forms that you have.  This is a list of the screening recommended for you and due dates:  Health Maintenance  Topic Date Due   DTaP/Tdap/Td vaccine (1 - Tdap) Never done   Screening for Lung Cancer  11/15/2020   COVID-19 Vaccine (3 - 2024-25 season) 09/22/2022   Mammogram  02/22/2023   Hemoglobin A1C  07/23/2023   Eye exam for diabetics  12/06/2023   Yearly kidney function blood test for diabetes  01/23/2024   Yearly kidney health urinalysis for diabetes  01/23/2024   Complete foot exam   01/23/2024   Medicare Annual Wellness Visit  03/12/2024   Colon Cancer Screening  05/09/2027   Pneumonia Vaccine  Completed   Flu Shot  Completed   DEXA scan (bone density measurement)  Completed   Zoster (Shingles) Vaccine  Completed   Hepatitis C Screening  Addressed   HPV Vaccine  Aged Out    Advanced directives: (Copy Requested) Please bring a copy of your health care power of attorney and living will to the office to be added to your chart at your convenience.   Next Medicare Annual Wellness Visit scheduled for next year: Yes, 03/25/24 @ 3:10pm (phone visit)

## 2023-03-13 NOTE — Progress Notes (Signed)
 Subjective:   Misty Villarreal is a 73 y.o. who presents for a Medicare Wellness preventive visit.  Visit Complete: Virtual I connected with  Misty Villarreal on 03/13/23 by a audio enabled telemedicine application and verified that I am speaking with the correct person using two identifiers.  Patient Location: Home  Provider Location: Home Office  I discussed the limitations of evaluation and management by telemedicine. The patient expressed understanding and agreed to proceed.  Vital Signs: Because this visit was a virtual/telehealth visit, some criteria may be missing or patient reported. Any vitals not documented were not able to be obtained and vitals that have been documented are patient reported.  VideoDeclined- This patient declined Librarian, academic. Therefore the visit was completed with audio only.  AWV Questionnaire: No: Patient Medicare AWV questionnaire was not completed prior to this visit.  Cardiac Risk Factors include: advanced age (>30men, >26 women);hypertension;diabetes mellitus;dyslipidemia;obesity (BMI >30kg/m2);Other (see comment), Risk factor comments: CAD     Objective:    Today's Vitals   03/13/23 1205  Weight: 182 lb (82.6 kg)  Height: 5\' 2"  (1.575 m)   Body mass index is 33.29 kg/m.     03/13/2023   12:24 PM 11/15/2022    3:27 PM 06/03/2022    1:42 PM 03/14/2022    9:00 AM 03/14/2022    2:26 AM 11/29/2021    1:19 PM 11/14/2021    2:08 PM  Advanced Directives  Does Patient Have a Medical Advance Directive? No No No No No Yes No  Does patient want to make changes to medical advance directive?   No - Patient declined    No - Patient declined  Would patient like information on creating a medical advance directive? No - Patient declined   No - Patient declined       Current Medications (verified) Outpatient Encounter Medications as of 03/13/2023  Medication Sig   aspirin 81 MG chewable tablet Chew 81 mg by mouth  daily.    atorvastatin (LIPITOR) 40 MG tablet Take 1 tablet (40 mg total) by mouth daily.   blood glucose meter kit and supplies KIT Dispense based on patient and insurance preference. Use to check BS bid   Cholecalciferol (VITAMIN D) 50 MCG (2000 UT) CAPS Take 2 capsules by mouth daily.   cholestyramine (QUESTRAN) 4 GM/DOSE powder Take 1 packet (4 g total) by mouth 2 (two) times daily with a meal.   clopidogrel (PLAVIX) 75 MG tablet TAKE 1 TABLET BY MOUTH DAILY   Cyanocobalamin (VITAMIN B 12 PO) Take 1,000 mcg by mouth daily. 1000mg  daily   diphenoxylate-atropine (LOMOTIL) 2.5-0.025 MG tablet Take 1 tablet by mouth 4 (four) times daily as needed for diarrhea or loose stools.   FARXIGA 10 MG TABS tablet TAKE 1 TABLET BY MOUTH DAILY  BEFORE BREAKFAST   fexofenadine (ALLEGRA) 180 MG tablet Take 180 mg by mouth daily.   ipratropium (ATROVENT) 0.06 % nasal spray Place 2 sprays into both nostrils 4 (four) times daily.   lansoprazole (PREVACID) 30 MG capsule Take 30 mg by mouth daily at 12 noon.   lisinopril (ZESTRIL) 20 MG tablet TAKE 1 TABLET BY MOUTH DAILY   loperamide (IMODIUM A-D) 2 MG tablet Take 2 mg by mouth as needed for diarrhea or loose stools.   meclizine (ANTIVERT) 12.5 MG tablet Take 1 tablet (12.5 mg total) by mouth 2 (two) times daily as needed for dizziness.   metoprolol tartrate (LOPRESSOR) 50 MG tablet Take 50 mg by  mouth 2 (two) times daily.   Multiple Vitamin tablet Take 1 tablet by mouth daily.    sertraline (ZOLOFT) 50 MG tablet TAKE 1 TABLET BY MOUTH DAILY   albuterol (VENTOLIN HFA) 108 (90 Base) MCG/ACT inhaler Inhale 2 puffs into the lungs every 6 (six) hours as needed. (Patient not taking: Reported on 03/13/2023)   chlorhexidine (PERIDEX) 0.12 % solution Use as directed 15 mLs in the mouth or throat in the morning, at noon, and at bedtime. (Patient not taking: Reported on 03/13/2023)   lidocaine (XYLOCAINE) 2 % solution Use as directed 15 mLs in the mouth or throat every 3  (three) hours as needed for mouth pain (swish and spit). (Patient not taking: Reported on 03/13/2023)   No facility-administered encounter medications on file as of 03/13/2023.    Allergies (verified) Augmentin [amoxicillin-pot clavulanate], Levaquin [levofloxacin], Tetracycline, Cefaclor, Cephalexin, Rosuvastatin, and Sulfa antibiotics   History: Past Medical History:  Diagnosis Date   Abnormal glandular Papanicolaou smear of cervix 09/01/2014   Normal Pap 2012    Anemia    Chronic kidney disease    stage 3   Depression    Diabetes mellitus without complication (HCC)    DVT (deep venous thrombosis) (HCC)    Hyperlipidemia    Hypertension    Peripheral vascular disease (HCC)    Peripheral vascular disease (HCC)    Slurred speech 12/03/2016   Past Surgical History:  Procedure Laterality Date   ANGIOPLASTY / STENTING FEMORAL Left 2014, 2013   BRAIN MENINGIOMA EXCISION  1988   BREAST BIOPSY Right 1989   benign   CARPAL TUNNEL RELEASE Left 1980   CATARACT EXTRACTION W/PHACO Right 09/02/2019   Procedure: CATARACT EXTRACTION PHACO AND INTRAOCULAR LENS PLACEMENT (IOC);  Surgeon: Nevada Crane, MD;  Location: [facility] ORS;  Service: Ophthalmology;  Laterality: Right;  [place] 00:27.3 CDE 1.92 AP% Fluid Pack lot # [id number]   CATARACT EXTRACTION W/PHACO Left 10/18/2019   Procedure: CATARACT EXTRACTION PHACO AND INTRAOCULAR LENS PLACEMENT (IOC) LEFT DIABETIC 2.55  00:28.5;  Surgeon: Nevada Crane, MD;  Location: [facility];  Service: Ophthalmology;  Laterality: Left;  Diabetic - oral meds   CESAREAN SECTION     CHOLECYSTECTOMY  1987   COLONOSCOPY WITH PROPOFOL N/A 05/08/2017   Procedure: COLONOSCOPY WITH PROPOFOL;  Surgeon: Wyline Mood, MD;  Location: [facility];  Service: Gastroenterology;  Laterality: N/A;   KNEE ARTHROPLASTY Right 2009   LOWER EXTREMITY ANGIOGRAPHY Left 07/21/2018   Procedure: LOWER EXTREMITY ANGIOGRAPHY;  Surgeon: Renford Dills, MD;  Location: [facility]  INVASIVE CV LAB;  Service: Cardiovascular;  Laterality: Left;   Family History  Problem Relation Age of Onset   Diabetes Mother    Heart failure Father    Diabetes Father    Breast cancer Neg Hx    Social History   Socioeconomic History   Marital status: Widowed    Spouse name: Not on file   Number of children: 2   Years of education: Not on file   Highest education level: Not on file  Occupational History   Occupation: retired  Tobacco Use   Smoking status: Former    Current packs/day: 0.00    Average packs/day: 0.5 packs/day for 46.0 years (23.0 ttl pk-yrs)    Types: Cigarettes    Start date: 04/06/1967    Quit date: 04/05/2013    Years since quitting: 9.9   Smokeless tobacco: Never  Vaping Use   Vaping status: Never Used  Substance and Sexual Activity  Alcohol use: Not Currently    Alcohol/week: 2.0 standard drinks of alcohol    Types: 2 Standard drinks or equivalent per week   Drug use: Never   Sexual activity: Not Currently    Partners: Male  Other Topics Concern   Not on file  Social History Narrative   Pt lives alone   Social Drivers of Health   Financial Resource Strain: Low Risk  (03/13/2023)   Overall Financial Resource Strain (CARDIA)    Difficulty of Paying Living Expenses: Not hard at all  Food Insecurity: No Food Insecurity (03/13/2023)   Hunger Vital Sign    Worried About Running Out of Food in the Last Year: Never true    Ran Out of Food in the Last Year: Never true  Transportation Needs: No Transportation Needs (03/13/2023)   PRAPARE - Administrator, Civil Service (Medical): No    Lack of Transportation (Non-Medical): No  Physical Activity: Sufficiently Active (03/13/2023)   Exercise Vital Sign    Days of Exercise per Week: 7 days    Minutes of Exercise per Session: 30 min  Stress: No Stress Concern Present (03/13/2023)   [employer] of Occupational Health - Occupational Stress Questionnaire    Feeling of Stress : Not at all   Social Connections: Socially Isolated (03/13/2023)   Social Connection and Isolation Panel [NHANES]    Frequency of Communication with Friends and Family: More than three times a week    Frequency of Social Gatherings with Friends and Family: More than three times a week    Attends Religious Services: Never    Database administrator or Organizations: No    Attends Banker Meetings: Never    Marital Status: Widowed    Tobacco Counseling Counseling given: Not Answered    Clinical Intake:  Pre-visit preparation completed: Yes  Pain : No/denies pain     BMI - recorded: 33.29 Nutritional Status: BMI > 30  Obese Nutritional Risks: None Diabetes: Yes CBG done?: No Did pt. bring in CBG monitor from home?: No  How often do you need to have someone help you when you read instructions, pamphlets, or other written materials from your doctor or pharmacy?: 1 - Never  Interpreter Needed?: No  Information entered by :: Tora Kindred, CMA   Activities of Daily Living     03/13/2023   12:08 PM 03/16/2022   11:38 AM  In your present state of health, do you have any difficulty performing the following activities:  Hearing? 1   Comment referred to ENT for evaluation   Vision? 0   Difficulty concentrating or making decisions? 0   Walking or climbing stairs? 0   Dressing or bathing? 0   Doing errands, shopping? 0 0  Preparing Food and eating ? N   Using the Toilet? N   In the past six months, have you accidently leaked urine? N   Do you have problems with loss of bowel control? Y   Comment wears depends prn   Managing your Medications? N   Managing your Finances? N   Housekeeping or managing your Housekeeping? N     Patient Care Team: Reubin Milan, MD as PCP - General (Internal Medicine) Gilda Crease, Latina Craver, MD (Vascular Surgery) Mady Haagensen, MD (Nephrology) Rickard Patience, MD as Consulting Physician (Oncology) Nevada Crane, MD as Consulting Physician  (Ophthalmology)  Indicate any recent Medical Services you may have received from other than [facility] providers in the past year (  date may be approximate).     Assessment:   This is a routine wellness examination for [place].  Hearing/Vision screen Hearing Screening - Comments:: Some hearing loss, referral placed to ENT for evaluation Vision Screening - Comments:: Gets eye exams, Dr. Willey Blade @ [facility]   Goals Addressed               This Visit's Progress     Patient Stated (pt-stated)        Lose 25 lbs       Depression Screen     03/13/2023   12:22 PM 01/23/2023   11:09 AM 07/30/2022    9:35 AM 03/25/2022   10:51 AM 12/07/2021   10:01 AM 11/14/2021    2:07 PM 08/06/2021    2:57 PM  PHQ 2/9 Scores  PHQ - 2 Score 1 0 0 0 0 0 0  PHQ- 9 Score 1 0 0 0 0 0 0    Fall Risk     03/13/2023   12:26 PM 01/23/2023   11:08 AM 07/30/2022    9:35 AM 03/25/2022   10:51 AM 12/07/2021   10:01 AM  Fall Risk   Falls in the past year? 0 0 0 0 0  Number falls in past yr: 0 0 0 0 0  Injury with Fall? 0 0 0 0 0  Risk for fall due to : No Fall Risks No Fall Risks No Fall Risks History of fall(s) No Fall Risks  Follow up Falls prevention discussed;Falls evaluation completed Falls evaluation completed Falls evaluation completed Falls evaluation completed Falls evaluation completed    MEDICARE RISK AT HOME:  Medicare Risk at Home Any stairs in or around the home?: Yes If so, are there any without handrails?: No Home free of loose throw rugs in walkways, pet beds, electrical cords, etc?: Yes Adequate lighting in your home to reduce risk of falls?: Yes Life alert?: No Use of a cane, walker or w/c?: No Grab bars in the bathroom?: Yes Shower chair or bench in shower?: Yes Elevated toilet seat or a handicapped toilet?: Yes  TIMED UP AND GO:  Was the test performed?  No  Cognitive Function: 6CIT completed        03/13/2023   12:27 PM 11/14/2021    2:09 PM 10/20/2019   11:44 AM  08/04/2017   10:55 AM 08/05/2016   10:27 AM  6CIT Screen  What Year? 0 points 0 points 0 points 0 points 0 points  What month? 0 points 0 points 0 points 0 points 0 points  What time? 0 points 0 points 0 points 0 points 0 points  Count back from 20 0 points 0 points 0 points 0 points 0 points  Months in reverse 0 points 0 points 0 points 0 points 0 points  Repeat phrase 0 points 0 points 0 points 0 points 0 points  Total Score 0 points 0 points 0 points 0 points 0 points    Immunizations Immunization History  Administered Date(s) Administered   Fluad Quad(high Dose 65+) 10/23/2020, 12/07/2021   Fluad Trivalent(High Dose 65+) 01/23/2023   Influenza, High Dose Seasonal PF 11/13/2016, 11/12/2018   Influenza-Unspecified 12/18/2017, 11/22/2019   PFIZER(Purple Top)SARS-COV-2 Vaccination 03/24/2019, 04/14/2019   Pneumococcal Conjugate-13 08/04/2015   Pneumococcal Polysaccharide-23 12/15/2012, 09/09/2018   Zoster Recombinant(Shingrix) 12/04/2017, 11/12/2018    Screening Tests Health Maintenance  Topic Date Due   DTaP/Tdap/Td (1 - Tdap) Never done   Lung Cancer Screening  11/15/2020   COVID-19  Vaccine (3 - 2024-25 season) 09/22/2022   MAMMOGRAM  02/22/2023   HEMOGLOBIN A1C  07/23/2023   OPHTHALMOLOGY EXAM  12/06/2023   Diabetic kidney evaluation - eGFR measurement  01/23/2024   Diabetic kidney evaluation - Urine ACR  01/23/2024   FOOT EXAM  01/23/2024   Medicare Annual Wellness (AWV)  03/12/2024   Colonoscopy  05/09/2027   Pneumonia Vaccine 43+ Years old  Completed   INFLUENZA VACCINE  Completed   DEXA SCAN  Completed   Zoster Vaccines- Shingrix  Completed   Hepatitis C Screening  Addressed   HPV VACCINES  Aged Out    Health Maintenance  Health Maintenance Due  Topic Date Due   DTaP/Tdap/Td (1 - Tdap) Never done   Lung Cancer Screening  11/15/2020   COVID-19 Vaccine (3 - 2024-25 season) 09/22/2022   MAMMOGRAM  02/22/2023   Health Maintenance Items Addressed: See Nurse  Notes  Additional Screening:  Vision Screening: Recommended annual ophthalmology exams for early detection of glaucoma and other disorders of the eye.  Dental Screening: Recommended annual dental exams for proper oral hygiene  Community Resource Referral / Chronic Care Management: CRR required this visit?  No   CCM required this visit?  No     Plan:     I have personally reviewed and noted the following in the patient's chart:   Medical and social history Use of alcohol, tobacco or illicit drugs  Current medications and supplements including opioid prescriptions. Patient is not currently taking opioid prescriptions. Functional ability and status Nutritional status Physical activity Advanced directives List of other physicians Hospitalizations, surgeries, and ER visits in previous 12 months Vitals Screenings to include cognitive, depression, and falls Referrals and appointments  In addition, I have reviewed and discussed with patient certain preventive protocols, quality metrics, and best practice recommendations. A written personalized care plan for preventive services as well as general preventive health recommendations were provided to patient.     Tora Kindred, CMA   03/13/2023   After Visit Summary: (MyChart) Due to this being a telephonic visit, the after visit summary with patients personalized plan was offered to patient via MyChart   Notes:  Placed referral to ENT for evaluation of hearing loss Declined DM & Nutrition education Declined Covid and Tdap vaccines Declined DEXA scan

## 2023-03-17 ENCOUNTER — Ambulatory Visit: Payer: Medicare Other

## 2023-03-18 ENCOUNTER — Other Ambulatory Visit: Payer: Self-pay

## 2023-03-18 DIAGNOSIS — Z87891 Personal history of nicotine dependence: Secondary | ICD-10-CM

## 2023-03-18 DIAGNOSIS — Z122 Encounter for screening for malignant neoplasm of respiratory organs: Secondary | ICD-10-CM

## 2023-04-02 LAB — COMPREHENSIVE METABOLIC PANEL WITH GFR
Calcium: 9.1 (ref 8.7–10.7)
eGFR: 33

## 2023-04-02 LAB — CBC AND DIFFERENTIAL
HCT: 36 (ref 36–46)
Hemoglobin: 11.8 — AB (ref 12.0–16.0)
Platelets: 255 10*3/uL (ref 150–400)
WBC: 7.8

## 2023-04-02 LAB — BASIC METABOLIC PANEL WITH GFR
BUN: 36 — AB (ref 4–21)
CO2: 24 — AB (ref 13–22)
Chloride: 105 (ref 99–108)
Creatinine: 1.6 — AB (ref 0.5–1.1)
Potassium: 5.4 meq/L — AB (ref 3.5–5.1)
Sodium: 136 — AB (ref 137–147)

## 2023-04-17 ENCOUNTER — Ambulatory Visit

## 2023-04-18 ENCOUNTER — Other Ambulatory Visit: Payer: Self-pay | Admitting: Internal Medicine

## 2023-04-18 DIAGNOSIS — I10 Essential (primary) hypertension: Secondary | ICD-10-CM

## 2023-04-21 NOTE — Telephone Encounter (Signed)
 Requested Prescriptions  Pending Prescriptions Disp Refills   lisinopril (ZESTRIL) 20 MG tablet [Pharmacy Med Name: Lisinopril 20 MG Oral Tablet] 90 tablet 1    Sig: TAKE 1 TABLET BY MOUTH DAILY     Cardiovascular:  ACE Inhibitors Failed - 04/21/2023  9:35 AM      Failed - Cr in normal range and within 180 days    Creatinine  Date Value Ref Range Status  04/27/2013 1.28 0.60 - 1.30 mg/dL Final   Creatinine, Ser  Date Value Ref Range Status  01/23/2023 1.53 (H) 0.57 - 1.00 mg/dL Final   Creatinine, Urine  Date Value Ref Range Status  04/09/2021 243  Final         Passed - K in normal range and within 180 days    Potassium  Date Value Ref Range Status  01/23/2023 4.7 3.5 - 5.2 mmol/L Final  04/27/2013 4.2 3.5 - 5.1 mmol/L Final         Passed - Patient is not pregnant      Passed - Last BP in normal range    BP Readings from Last 1 Encounters:  01/23/23 128/70         Passed - Valid encounter within last 6 months    Recent Outpatient Visits   None     Future Appointments             In 1 month Judithann Graves, Nyoka Cowden, MD [facility] at [facility], [facility]

## 2023-05-20 ENCOUNTER — Other Ambulatory Visit (INDEPENDENT_AMBULATORY_CARE_PROVIDER_SITE_OTHER): Payer: Self-pay | Admitting: Vascular Surgery

## 2023-05-20 DIAGNOSIS — I70213 Atherosclerosis of native arteries of extremities with intermittent claudication, bilateral legs: Secondary | ICD-10-CM

## 2023-05-23 ENCOUNTER — Ambulatory Visit: Payer: Self-pay | Admitting: Internal Medicine

## 2023-05-25 NOTE — Progress Notes (Addendum)
 MRN : [id number]  Misty Villarreal is a 73 y.o. ([birth year]) female who presents with chief complaint of check circulation.  History of Present Illness:  The patient returns to the office for followup and review status post angiogram with intervention on 07/21/2018.    Procedure(s) Performed:             1.  Introduction catheter into left lower extremity 3rd order catheter placement              2.  Contrast injection left lower extremity for distal runoff             3.  Crosser atherectomy of the left SFA and popliteal arteries             4.   Percutaneous transluminal angioplasty left superficial femoral artery and popliteal             5.  Star close closure right common femoral arteriotomy   The patient notes her lower extremity symptoms are stable.  No interval shortening of the patient's claudication distance or rest pain symptoms. No new ulcers or wounds have occurred since the last visit.     There have been no significant changes to the patient's overall health care.   The patient denies amaurosis fugax or recent TIA symptoms. There are no recent neurological changes noted. The patient denies history of DVT, PE or superficial thrombophlebitis. The patient denies recent episodes of angina or shortness of breath.    ABI's Rt=1.18 and Lt=0.94 (previous ABI's Rt=0.85 and Lt=0.62).   Previous duplex ultrasound of the arterial system bilaterally shows the SFA intervention site on the right is patent and the left is occluded. No change compared to the last study.   No outpatient medications have been marked as taking for the 05/26/23 encounter (Appointment) with Prescilla Brod, Ninette Basque, MD.    Past Medical History:  Diagnosis Date   Abnormal glandular Papanicolaou smear of cervix 09/01/2014   Normal Pap 2012    Anemia    Chronic kidney disease    stage 3   Depression    Diabetes mellitus without complication  (HCC)    DVT (deep venous thrombosis) (HCC)    Hyperlipidemia    Hypertension    Peripheral vascular disease (HCC)    Peripheral vascular disease (HCC)    Slurred speech 12/03/2016    Past Surgical History:  Procedure Laterality Date   ANGIOPLASTY / STENTING FEMORAL Left 2014, 2013   BRAIN MENINGIOMA EXCISION  1988   BREAST BIOPSY Right 1989   benign   CARPAL TUNNEL RELEASE Left 1980   CATARACT EXTRACTION W/PHACO Right 09/02/2019   Procedure: CATARACT EXTRACTION PHACO AND INTRAOCULAR LENS PLACEMENT (IOC);  Surgeon: Rosa College, MD;  Location: [facility]S;  Service: Ophthalmology;  Laterality: Right;  US  00:27.3 CDE 1.92 AP% Fluid Pack lot # [id number]   CATARACT EXTRACTION W/PHACO Left 10/18/2019   Procedure: CATARACT EXTRACTION PHACO AND INTRAOCULAR LENS PLACEMENT (IOC) LEFT DIABETIC 2.55  00:28.5;  Surgeon: Rosa College, MD;  Location: [facility];  Service: Ophthalmology;  Laterality: Left;  Diabetic - oral meds   CESAREAN SECTION     CHOLECYSTECTOMY  1987   COLONOSCOPY WITH PROPOFOL  N/A 05/08/2017   Procedure: COLONOSCOPY WITH PROPOFOL ;  Surgeon: Luke Salaam, MD;  Location: [facility] END[facility];  Service: Gastroenterology;  Laterality: N/A;   KNEE ARTHROPLASTY Right 2009   LOWER EXTREMITY ANGIOGRAPHY Left 07/21/2018   Procedure: LOWER EXTREMITY ANGIOGRAPHY;  Surgeon: Jackquelyn Mass, MD;  Location: [facility] INV[facility];  Service: Cardiovascular;  Laterality: Left;    Social History Social History   Tobacco Use   Smoking status: Former    Current packs/day: 0.00    Average packs/day: 0.5 packs/day for 46.0 years (23.0 ttl pk-yrs)    Types: Cigarettes    Start date: 04/06/1967    Quit date: 04/05/2013    Years since quitting: 10.1   Smokeless tobacco: Never  Vaping Use   Vaping status: Never Used  Substance Use Topics   Alcohol use: Not Currently    Alcohol/week: 2.0 standard drinks of alcohol    Types: 2 Standard drinks or equivalent per week   Drug use:  Never    Family History Family History  Problem Relation Age of Onset   Diabetes Mother    Heart failure Father    Diabetes Father    Breast cancer Neg Hx     Allergies  Allergen Reactions   Augmentin  [Amoxicillin -Pot Clavulanate] Diarrhea   Levaquin  [Levofloxacin ] Nausea And Vomiting and Other (See Comments)    Ankle pain   Tetracycline     Other reaction(s): emesis   Cefaclor Rash   Cephalexin Rash   Rosuvastatin  Diarrhea   Sulfa Antibiotics Rash    Other reaction(s): emesis     REVIEW OF SYSTEMS (Negative unless checked)  Constitutional: [] Weight loss  [] Fever  [] Chills Cardiac: [] Chest pain   [] Chest pressure   [] Palpitations   [] Shortness of breath when laying flat   [] Shortness of breath with exertion. Vascular:  [x] Pain in legs with walking   [] Pain in legs at rest  [] History of DVT   [] Phlebitis   [] Swelling in legs   [] Varicose veins   [] Non-healing ulcers Pulmonary:   [] Uses home oxygen   [] Productive cough   [] Hemoptysis   [] Wheeze  [] COPD   [] Asthma Neurologic:  [] Dizziness   [] Seizures   [] History of stroke   [] History of TIA  [] Aphasia   [] Vissual changes   [] Weakness or numbness in arm   [] Weakness or numbness in leg Musculoskeletal:   [] Joint swelling   [] Joint pain   [] Low back pain Hematologic:  [] Easy bruising  [] Easy bleeding   [] Hypercoagulable state   [] Anemic Gastrointestinal:  [] Diarrhea   [] Vomiting  [] Gastroesophageal reflux/heartburn   [] Difficulty swallowing. Genitourinary:  [] Chronic kidney disease   [] Difficult urination  [] Frequent urination   [] Blood in urine Skin:  [] Rashes   [] Ulcers  Psychological:  [] History of anxiety   []  History of major depression.  Physical Examination  There were no vitals filed for this visit. There is no height or weight on file to calculate BMI. Gen: WD/WN, NAD Head: [place]/AT, No temporalis wasting.  Ear/Nose/Throat: Hearing grossly intact, nares w/o erythema or drainage Eyes: PER, EOMI, sclera nonicteric.   Neck: Supple, no masses.  No bruit or JVD.  Pulmonary:  Good air movement, no audible wheezing, no use of accessory muscles.  Cardiac: RRR, normal S1, S2, no Murmurs. Vascular:  mild trophic changes, no open wounds Vessel Right Left  Radial Palpable Palpable  PT Not Palpable Not Palpable  DP  Not Palpable Not Palpable  Gastrointestinal: soft, non-distended. No guarding/no peritoneal signs.  Musculoskeletal: M/S 5/5 throughout.  No visible deformity.  Neurologic: CN 2-12 intact. Pain and light touch intact in extremities.  Symmetrical.  Speech is fluent. Motor exam as listed above. Psychiatric: Judgment intact, Mood & affect appropriate for pt's clinical situation. Dermatologic: No rashes or ulcers noted.  No changes consistent with cellulitis.   CBC Lab Results  Component Value Date   WBC 8.6 11/28/2022   HGB 11.9 (A) 11/28/2022   HCT 37 11/28/2022   MCV 89.3 05/30/2022   PLT 355 11/28/2022    BMET    Component Value Date/Time   NA 137 01/23/2023 1140   NA 139 04/27/2013 0713   K 4.7 01/23/2023 1140   K 4.2 04/27/2013 0713   CL 99 01/23/2023 1140   CL 108 (H) 04/27/2013 0713   CO2 20 01/23/2023 1140   CO2 25 04/27/2013 0713   GLUCOSE 188 (H) 01/23/2023 1140   GLUCOSE 166 (H) 05/30/2022 1259   GLUCOSE 137 (H) 04/27/2013 0713   BUN 31 (H) 01/23/2023 1140   BUN 21 (H) 04/27/2013 0713   CREATININE 1.53 (H) 01/23/2023 1140   CREATININE 1.28 04/27/2013 0713   CALCIUM  8.9 01/23/2023 1140   CALCIUM  9.3 04/27/2013 0713   GFRNONAA 34 (L) 05/30/2022 1259   GFRNONAA 44 (L) 04/27/2013 0713   GFRAA 43 (L) 06/28/2019 1041   GFRAA 52 (L) 04/27/2013 0713   CrCl cannot be calculated (Patient's most recent lab result is older than the maximum 21 days allowed.).  COAG Lab Results  Component Value Date   INR 1.0 03/14/2022   INR 0.85 12/03/2016    Radiology No results found.   Assessment/Plan 1. Atherosclerosis of native artery of both lower extremities with intermittent  claudication (HCC) (Primary)  Recommend:  The patient has evidence of atherosclerosis of the lower extremities with claudication.  The patient does not voice lifestyle limiting changes at this point in time.  Noninvasive studies do not suggest clinically significant change.  No invasive studies, angiography or surgery at this time The patient should continue walking and begin a more formal exercise program.  The patient should continue antiplatelet therapy and aggressive treatment of the lipid abnormalities  No changes in the patient's medications at this time  Continued surveillance is indicated as atherosclerosis is likely to progress with time.    The patient will continue follow up with noninvasive studies as ordered.  - VAS US  ABI WITH/WO TBI; Future  2. Lymphedema Recommend:  No surgery or intervention at this point in time.    I have reviewed my previous discussion with the patient regarding swelling and why it  causes symptoms.  The patient is doing well with compression and will continue wearing graduated compression on a daily basis. The patient will  continue wearing the compression first thing in the morning and removing them in the evening. The patient is instructed specifically not to sleep in the compression.    In addition, behavioral modification including elevation during the day and exercise as tolerated will be continued.    Patient should follow-up on an annual basis   3. Coronary artery disease involving native coronary artery of native heart with angina pectoris (HCC) Continue cardiac and antihypertensive medications as already ordered and reviewed, no changes at this time.  Continue statin as ordered and reviewed, no changes at this time  Nitrates PRN for chest pain  4. Essential (primary) hypertension Continue antihypertensive medications as  already ordered, these medications have been reviewed and there are no changes at this time.  5. Type II diabetes  mellitus with complication (HCC) Continue hypoglycemic medications as already ordered, these medications have been reviewed and there are no changes at this time.  Hgb A1C to be monitored as already arranged by primary service    Devon Fogo, MD  05/25/2023 3:42 PM

## 2023-05-26 ENCOUNTER — Encounter (INDEPENDENT_AMBULATORY_CARE_PROVIDER_SITE_OTHER): Payer: Self-pay | Admitting: Vascular Surgery

## 2023-05-26 ENCOUNTER — Ambulatory Visit (INDEPENDENT_AMBULATORY_CARE_PROVIDER_SITE_OTHER): Payer: Medicare Other | Admitting: Vascular Surgery

## 2023-05-26 ENCOUNTER — Ambulatory Visit (INDEPENDENT_AMBULATORY_CARE_PROVIDER_SITE_OTHER): Payer: Medicare Other

## 2023-05-26 VITALS — BP 141/84 | HR 60 | Resp 18 | Ht 62.0 in | Wt 187.4 lb

## 2023-05-26 DIAGNOSIS — I10 Essential (primary) hypertension: Secondary | ICD-10-CM | POA: Diagnosis not present

## 2023-05-26 DIAGNOSIS — I25119 Atherosclerotic heart disease of native coronary artery with unspecified angina pectoris: Secondary | ICD-10-CM

## 2023-05-26 DIAGNOSIS — I70213 Atherosclerosis of native arteries of extremities with intermittent claudication, bilateral legs: Secondary | ICD-10-CM

## 2023-05-26 DIAGNOSIS — I890 Lymphedema, not elsewhere classified: Secondary | ICD-10-CM | POA: Diagnosis not present

## 2023-05-26 DIAGNOSIS — E118 Type 2 diabetes mellitus with unspecified complications: Secondary | ICD-10-CM

## 2023-05-30 ENCOUNTER — Encounter: Payer: Self-pay | Admitting: Internal Medicine

## 2023-05-30 ENCOUNTER — Ambulatory Visit (INDEPENDENT_AMBULATORY_CARE_PROVIDER_SITE_OTHER): Admitting: Internal Medicine

## 2023-05-30 VITALS — BP 110/78 | HR 69 | Ht 62.0 in | Wt 187.0 lb

## 2023-05-30 DIAGNOSIS — E118 Type 2 diabetes mellitus with unspecified complications: Secondary | ICD-10-CM

## 2023-05-30 DIAGNOSIS — E1169 Type 2 diabetes mellitus with other specified complication: Secondary | ICD-10-CM

## 2023-05-30 DIAGNOSIS — Z7984 Long term (current) use of oral hypoglycemic drugs: Secondary | ICD-10-CM

## 2023-05-30 DIAGNOSIS — G2581 Restless legs syndrome: Secondary | ICD-10-CM

## 2023-05-30 DIAGNOSIS — E785 Hyperlipidemia, unspecified: Secondary | ICD-10-CM

## 2023-05-30 DIAGNOSIS — I10 Essential (primary) hypertension: Secondary | ICD-10-CM | POA: Diagnosis not present

## 2023-05-30 DIAGNOSIS — N1832 Chronic kidney disease, stage 3b: Secondary | ICD-10-CM

## 2023-05-30 LAB — VAS US ABI WITH/WO TBI
Left ABI: 0.94
Right ABI: 1.18

## 2023-05-30 LAB — POCT GLYCOSYLATED HEMOGLOBIN (HGB A1C): Hemoglobin A1C: 7.8 % — AB (ref 4.0–5.6)

## 2023-05-30 NOTE — Patient Instructions (Signed)
 Call [facility] to schedule your mammogram at [phone].

## 2023-05-30 NOTE — Assessment & Plan Note (Signed)
 Recently evaluated by nephrology Labs stable but PTH elevated

## 2023-05-30 NOTE — Assessment & Plan Note (Signed)
 Having more symptoms recently. Has not been taking Requip  until recently - will call for refill if needed.

## 2023-05-30 NOTE — Assessment & Plan Note (Addendum)
 Blood sugars have been stable.  No recent hypoglycemic events requiring assistance. Currently medications are Farxiga .  She has tried to do more exercise and has made some diet changes. Lab Results  Component Value Date   HGBA1C 8.3 (H) 01/23/2023   Last visit no changes were made to medications. A1C today = 7.8. will continue the same medications, diet and exercise.

## 2023-05-30 NOTE — Progress Notes (Signed)
 Date:  05/30/2023   Name:  Misty Villarreal   DOB:  1951-01-08   MRN:  [id number]   Chief Complaint: Diabetes and Hypertension  Diabetes She presents for her follow-up diabetic visit. She has type 2 diabetes mellitus. Pertinent negatives for hypoglycemia include no dizziness or headaches. Pertinent negatives for diabetes include no chest pain, no fatigue and no weakness. Current diabetic treatments: Farxiga . She is compliant with treatment all of the time.  Hypertension Pertinent negatives include no chest pain, headaches, palpitations or shortness of breath.  Hyperlipidemia This is a chronic problem. Recent lipid tests were reviewed and are high. Pertinent negatives include no chest pain, myalgias or shortness of breath. Current antihyperlipidemic treatment includes statins. The current treatment provides moderate improvement of lipids. Compliance problems: reminded to take meds every day.     Review of Systems  Constitutional:  Negative for fatigue and unexpected weight change.  HENT:  Negative for trouble swallowing.   Eyes:  Negative for visual disturbance.  Respiratory:  Negative for cough, chest tightness, shortness of breath and wheezing.   Cardiovascular:  Negative for chest pain, palpitations and leg swelling.  Gastrointestinal:  Negative for abdominal pain, constipation and diarrhea.  Musculoskeletal:  Negative for arthralgias and myalgias.  Neurological:  Negative for dizziness, weakness, light-headedness and headaches.     Lab Results  Component Value Date   NA 136 (A) 04/02/2023   K 5.4 (A) 04/02/2023   CO2 24 (A) 04/02/2023   GLUCOSE 188 (H) 01/23/2023   BUN 36 (A) 04/02/2023   CREATININE 1.6 (A) 04/02/2023   CALCIUM  9.1 04/02/2023   EGFR 33 04/02/2023   GFRNONAA 34 (L) 05/30/2022   Lab Results  Component Value Date   CHOL 243 (H) 01/23/2023   HDL 50 01/23/2023   LDLCALC 141 (H) 01/23/2023   LDLDIRECT 76 07/14/2020   TRIG 285 (H) 01/23/2023   CHOLHDL 4.9  (H) 01/23/2023   Lab Results  Component Value Date   TSH 5.130 (H) 01/23/2023   Lab Results  Component Value Date   HGBA1C 7.8 (A) 05/30/2023   Lab Results  Component Value Date   WBC 7.8 04/02/2023   HGB 11.8 (A) 04/02/2023   HCT 36 04/02/2023   MCV 89.3 05/30/2022   PLT 255 04/02/2023   Lab Results  Component Value Date   ALT 18 01/23/2023   AST 18 01/23/2023   ALKPHOS 113 01/23/2023   BILITOT 0.7 01/23/2023   Lab Results  Component Value Date   VD25OH 33.2 01/23/2023     Patient Active Problem List   Diagnosis Date Noted   Atherosclerosis of artery of extremity with rest pain (HCC) 01/23/2023   Type II diabetes mellitus with renal manifestations (HCC) 03/14/2022   Chronic diastolic CHF (congestive heart failure) (HCC) 03/14/2022   Obesity (BMI 30-39.9) 03/14/2022   Enteritis 03/14/2022   Benign essential tremor 12/07/2021   Coronary artery disease involving native coronary artery of native heart with angina pectoris (HCC) 05/14/2021   Atherosclerosis of native arteries of extremity with intermittent claudication (HCC) 05/15/2020   Palpitations 03/29/2020   Stable angina pectoris (HCC) 03/29/2020   Aortic atherosclerosis (HCC) 11/19/2019   Stage 3b chronic kidney disease (HCC) 12/23/2018   Restless leg syndrome 12/23/2018   Unruptured synovial cyst of popliteal space 12/23/2018   Plantar fasciitis of left foot 12/23/2018   B12 deficiency 11/26/2018   Hypogammaglobulinemia (HCC) 11/11/2018   Anemia due to stage 3b chronic kidney disease (HCC) 11/11/2018   Osteopenia determined  by x-ray 02/16/2018   Major depressive disorder with single episode, in partial remission (HCC) 08/04/2017   Tinnitus of right ear 08/05/2016   Type II diabetes mellitus with complication (HCC) 08/03/2015   Lymphedema 06/21/2015   History of paroxysmal supraventricular tachycardia 06/21/2015   PAD (peripheral artery disease) (HCC) 02/02/2015   Hyperlipidemia associated with type 2  diabetes mellitus (HCC) 09/01/2014   Neuropathy 09/01/2014   Phlebectasia 09/01/2014   Essential (primary) hypertension 09/01/2014   Spondylolisthesis at L4-L5 level 07/05/2014   Arthritis, degenerative 01/31/2014    Allergies  Allergen Reactions   Augmentin  [Amoxicillin -Pot Clavulanate] Diarrhea   Levaquin  [Levofloxacin ] Nausea And Vomiting and Other (See Comments)    Ankle pain   Tetracycline     Other reaction(s): emesis   Cefaclor Rash   Cephalexin Rash   Rosuvastatin  Diarrhea   Sulfa Antibiotics Rash    Other reaction(s): emesis    Past Surgical History:  Procedure Laterality Date   ANGIOPLASTY / STENTING FEMORAL Left 2014, 2013   BRAIN MENINGIOMA EXCISION  1988   BREAST BIOPSY Right 1989   benign   CARPAL TUNNEL RELEASE Left 1980   CATARACT EXTRACTION W/PHACO Right 09/02/2019   Procedure: CATARACT EXTRACTION PHACO AND INTRAOCULAR LENS PLACEMENT (IOC);  Surgeon: Rosa College, MD;  Location: [facility] ORS;  Service: Ophthalmology;  Laterality: Right;  US  00:27.3 CDE 1.92 AP% Fluid Pack lot # [id number]   CATARACT EXTRACTION W/PHACO Left 10/18/2019   Procedure: CATARACT EXTRACTION PHACO AND INTRAOCULAR LENS PLACEMENT (IOC) LEFT DIABETIC 2.55  00:28.5;  Surgeon: Rosa College, MD;  Location: [facility];  Service: Ophthalmology;  Laterality: Left;  Diabetic - oral meds   CESAREAN SECTION     CHOLECYSTECTOMY  1987   COLONOSCOPY WITH PROPOFOL  N/A 05/08/2017   Procedure: COLONOSCOPY WITH PROPOFOL ;  Surgeon: Luke Salaam, MD;  Location: [facility] ENDOSCOPY;  Service: Gastroenterology;  Laterality: N/A;   KNEE ARTHROPLASTY Right 2009   LOWER EXTREMITY ANGIOGRAPHY Left 07/21/2018   Procedure: LOWER EXTREMITY ANGIOGRAPHY;  Surgeon: Jackquelyn Mass, MD;  Location: [facility] INVASIVE CV LAB;  Service: Cardiovascular;  Laterality: Left;    Social History   Tobacco Use   Smoking status: Former    Current packs/day: 0.00    Average packs/day: 0.5 packs/day for 46.0 years (23.0  ttl pk-yrs)    Types: Cigarettes    Start date: 04/06/1967    Quit date: 04/05/2013    Years since quitting: 10.1   Smokeless tobacco: Never  Vaping Use   Vaping status: Never Used  Substance Use Topics   Alcohol use: Not Currently    Alcohol/week: 2.0 standard drinks of alcohol    Types: 2 Standard drinks or equivalent per week   Drug use: Never     Medication list has been reviewed and updated.  Current Meds  Medication Sig   albuterol  (VENTOLIN  HFA) 108 (90 Base) MCG/ACT inhaler Inhale 2 puffs into the lungs every 6 (six) hours as needed. (Patient taking differently: Inhale 2 puffs into the lungs as needed.)   aspirin  81 MG chewable tablet Chew 81 mg by mouth daily.    atorvastatin  (LIPITOR) 40 MG tablet Take 1 tablet (40 mg total) by mouth daily.   blood glucose meter kit and supplies KIT Dispense based on patient and insurance preference. Use to check BS bid   Cholecalciferol  (VITAMIN D ) 50 MCG (2000 UT) CAPS Take 2 capsules by mouth daily.   cholestyramine  (QUESTRAN ) 4 GM/DOSE powder Take 1 packet (4 g total) by mouth  2 (two) times daily with a meal.   clopidogrel  (PLAVIX ) 75 MG tablet TAKE 1 TABLET BY MOUTH DAILY   Cyanocobalamin  (VITAMIN B 12 PO) Take 1,000 mcg by mouth daily. 1000mg  daily   FARXIGA  10 MG TABS tablet TAKE 1 TABLET BY MOUTH DAILY  BEFORE BREAKFAST   fexofenadine (ALLEGRA) 180 MG tablet Take 180 mg by mouth daily.   lansoprazole (PREVACID) 30 MG capsule Take 30 mg by mouth daily at 12 noon.   lisinopril  (ZESTRIL ) 20 MG tablet TAKE 1 TABLET BY MOUTH DAILY   loperamide (IMODIUM A-D) 2 MG tablet Take 2 mg by mouth as needed for diarrhea or loose stools.   meclizine  (ANTIVERT ) 12.5 MG tablet Take 1 tablet (12.5 mg total) by mouth 2 (two) times daily as needed for dizziness.   metoprolol  tartrate (LOPRESSOR ) 50 MG tablet Take 50 mg by mouth 2 (two) times daily.   Multiple Vitamin tablet Take 1 tablet by mouth daily.    sertraline  (ZOLOFT ) 50 MG tablet TAKE 1 TABLET  BY MOUTH DAILY       05/30/2023    1:18 PM 01/23/2023   11:09 AM 07/30/2022    9:35 AM 03/25/2022   10:51 AM  GAD 7 : Generalized Anxiety Score  Nervous, Anxious, on Edge 0 0 0 0  Control/stop worrying 0 0 0 0  Worry too much - different things 0 0 0 0  Trouble relaxing 0 0 0 0  Restless 0 0 0 0  Easily annoyed or irritable 0 0 0 0  Afraid - awful might happen 0 0 0 0  Total GAD 7 Score 0 0 0 0  Anxiety Difficulty Not difficult at all Not difficult at all Not difficult at all Not difficult at all       05/30/2023    1:18 PM 03/13/2023   12:22 PM 01/23/2023   11:09 AM  Depression screen PHQ 2/9  Decreased Interest 0 1 0  Down, Depressed, Hopeless 0 0 0  PHQ - 2 Score 0 1 0  Altered sleeping 0 0 0  Tired, decreased energy 0 0 0  Change in appetite 0 0 0  Feeling bad or failure about yourself  0 0 0  Trouble concentrating 0 0 0  Moving slowly or fidgety/restless 0 0 0  Suicidal thoughts 0 0 0  PHQ-9 Score 0 1 0  Difficult doing work/chores Not difficult at all Not difficult at all Not difficult at all    BP Readings from Last 3 Encounters:  05/30/23 110/78  05/26/23 (!) 141/84  01/23/23 128/70    Physical Exam Vitals and nursing note reviewed.  Constitutional:      General: She is not in acute distress.    Appearance: She is well-developed.  HENT:     Head: Normocephalic and atraumatic.  Pulmonary:     Effort: Pulmonary effort is normal. No respiratory distress.  Skin:    General: Skin is warm and dry.     Findings: No rash.  Neurological:     Mental Status: She is alert and oriented to person, place, and time.  Psychiatric:        Mood and Affect: Mood normal.        Behavior: Behavior normal.     Wt Readings from Last 3 Encounters:  05/30/23 187 lb (84.8 kg)  05/26/23 187 lb 6.4 oz (85 kg)  03/13/23 182 lb (82.6 kg)    BP 110/78   Pulse 69   Ht 5\' 2"  (  1.575 m)   Wt 187 lb (84.8 kg)   LMP  (LMP Unknown)   SpO2 96%   BMI 34.20 kg/m   Assessment and  Plan:  Problem List Items Addressed This Visit       Unprioritized   Hyperlipidemia associated with type 2 diabetes mellitus (HCC) (Chronic)   LDL is  Lab Results  Component Value Date   LDLCALC 141 (H) 01/23/2023   Current regimen is atorvastatin  40 mg.  No medication side effects noted. Goal LDL is <70.       Essential (primary) hypertension - Primary (Chronic)   Blood pressure is well controlled.  Current medications are lisinopril  and metoprolol . Will continue same regimen along with efforts to limit dietary sodium.       Type II diabetes mellitus with complication (HCC) (Chronic)   Blood sugars have been stable.  No recent hypoglycemic events requiring assistance. Currently medications are Farxiga .  She has tried to do more exercise and has made some diet changes. Lab Results  Component Value Date   HGBA1C 8.3 (H) 01/23/2023   Last visit no changes were made to medications. A1C today = 7.8. will continue the same medications, diet and exercise.        Relevant Orders   POCT glycosylated hemoglobin (Hb A1C) (Completed)   Stage 3b chronic kidney disease (HCC) (Chronic)   Recently evaluated by nephrology Labs stable but PTH elevated      Restless leg syndrome   Having more symptoms recently. Has not been taking Requip  until recently - will call for refill if needed.      Other Visit Diagnoses       Long term current use of oral hypoglycemic drug           Return in about 4 months (around 09/30/2023) for DM, HTN.    Sheron Dixons, MD [facility] [place]

## 2023-05-30 NOTE — Assessment & Plan Note (Signed)
 Blood pressure is well controlled.  Current medications are lisinopril and metoprolol. Will continue same regimen along with efforts to limit dietary sodium.

## 2023-05-30 NOTE — Assessment & Plan Note (Signed)
 LDL is  Lab Results  Component Value Date   LDLCALC 141 (H) 01/23/2023   Current regimen is atorvastatin  40 mg.  No medication side effects noted. Goal LDL is <70.

## 2023-06-01 ENCOUNTER — Encounter (INDEPENDENT_AMBULATORY_CARE_PROVIDER_SITE_OTHER): Payer: Self-pay | Admitting: Vascular Surgery

## 2023-06-03 ENCOUNTER — Inpatient Hospital Stay: Payer: Medicare Other

## 2023-06-05 ENCOUNTER — Ambulatory Visit: Payer: Medicare Other | Admitting: Oncology

## 2023-06-05 ENCOUNTER — Ambulatory Visit: Payer: Medicare Other

## 2023-06-10 ENCOUNTER — Encounter (INDEPENDENT_AMBULATORY_CARE_PROVIDER_SITE_OTHER): Payer: Self-pay

## 2023-06-25 ENCOUNTER — Encounter: Payer: Self-pay | Admitting: Internal Medicine

## 2023-06-25 ENCOUNTER — Ambulatory Visit: Payer: Self-pay

## 2023-06-25 NOTE — Telephone Encounter (Signed)
 Noted  KP

## 2023-06-25 NOTE — Telephone Encounter (Signed)
 FYI Only or Action Required?: FYI only for provider  Patient was last seen in primary care on 05/30/2023. Called Nurse Triage reporting Vaginal Discharge. Symptoms began several weeks ago. Interventions attempted: OTC medications: Monistat cream x2 treatments, Monistat spray for itching. Symptoms are: dry white vaginal discharge, vaginal itching and burning unchanged after OTC treatment.  Triage Disposition: See PCP When Office is Open (Within 3 Days)  Patient/caregiver understands and will follow disposition?: Yes                                 Copied from CRM [id number]. Topic: Clinical - Red Word Triage >> Jun 25, 2023 11:14 AM Chrystal Crape R wrote: Vaginal discharge, itching and burning pt has received notification that she needed to be scheduled before receiving medication. Reason for Disposition  [1] Symptoms of a "yeast infection" (i.e., itchy, white discharge, not bad smelling) AND [2] not improved > 3 days following Care Advice  Answer Assessment - Initial Assessment Questions 1. DISCHARGE: "Describe the discharge." (e.g., white, yellow, green, gray, foamy, cottage cheese-like)     Dry white discharge.  2. ODOR: "Is there a bad odor?"     No.  3. ONSET: "When did the discharge begin?"     2 weeks.  4. RASH: "Is there a rash in the genital area?" If Yes, ask: "Describe it." (e.g., redness, blisters, sores, bumps)     No.  5. ABDOMEN PAIN: "Are you having any abdomen pain?" If Yes, ask: "What does it feel like? " (e.g., crampy, dull, intermittent, constant)      No.  6. ABDOMEN PAIN SEVERITY: If present, ask: "How bad is it?" (e.g., Scale 1-10; mild, moderate, or severe)   - MILD (1-3): Doesn't interfere with normal activities, abdomen soft and not tender to touch.    - MODERATE (4-7): Interferes with normal activities or awakens from sleep, abdomen tender to touch.    - SEVERE (8-10): Excruciating pain, doubled over, unable to do any normal activities.  (R/O peritonitis)      No.  7. CAUSE: "What do you think is causing the discharge?" "Have you had the same problem before? What happened then?"     Feels like a yeast infection, states she has had it before but years ago.  8. OTHER SYMPTOMS: "Do you have any other symptoms?" (e.g., fever, itching, vaginal bleeding, pain with urination, injury to genital area, vaginal foreign body)     Itching and burning,  9. PREGNANCY: "Is there any chance you are pregnant?" "When was your last menstrual period?"     N/A.  Patient denies rash, sores, blisters, fever, abdominal pain, foul odor.  Protocols used: Vaginal Discharge-A-AH

## 2023-06-27 ENCOUNTER — Other Ambulatory Visit (HOSPITAL_COMMUNITY)
Admission: RE | Admit: 2023-06-27 | Discharge: 2023-06-27 | Disposition: A | Discharge status: Home / Self Care | Source: Ambulatory Visit | Attending: Internal Medicine | Admitting: Internal Medicine

## 2023-06-27 ENCOUNTER — Encounter: Payer: Self-pay | Admitting: Internal Medicine

## 2023-06-27 ENCOUNTER — Ambulatory Visit (INDEPENDENT_AMBULATORY_CARE_PROVIDER_SITE_OTHER): Admitting: Internal Medicine

## 2023-06-27 VITALS — BP 128/74 | HR 71 | Ht 62.0 in | Wt 187.2 lb

## 2023-06-27 DIAGNOSIS — N760 Acute vaginitis: Secondary | ICD-10-CM | POA: Insufficient documentation

## 2023-06-27 LAB — POCT URINALYSIS DIPSTICK
Bilirubin, UA: NEGATIVE
Blood, UA: NEGATIVE
Glucose, UA: POSITIVE — AB
Ketones, UA: NEGATIVE
Leukocytes, UA: NEGATIVE
Nitrite, UA: NEGATIVE
Protein, UA: NEGATIVE
Spec Grav, UA: 1.015 (ref 1.010–1.025)
Urobilinogen, UA: 0.2 U/dL
pH, UA: 5 (ref 5.0–8.0)

## 2023-06-27 MED ORDER — FLUCONAZOLE 100 MG PO TABS: 100.0000 mg | ORAL_TABLET | Freq: Every day | ORAL | 0 refills | Status: AC

## 2023-06-27 NOTE — Progress Notes (Signed)
 Date:  06/27/2023   Name:  Misty Villarreal   DOB:  [birth year]   MRN:  [id number]   Chief Complaint: Vaginal Discharge (Has tired OTC Monistat cream, will go away and come back) and Vaginal Itching  Vaginal Itching The patient's primary symptoms include genital itching. The patient's pertinent negatives include no vaginal discharge. This is a recurrent problem. The patient is experiencing no pain. Pertinent negatives include no abdominal pain, chills, constipation, diarrhea, dysuria, headaches or hematuria.  She is on Farxiga  for diabetes.  Monistat helped last month but symptoms recurred.  Monistat so far this week has not been as beneficial.  Review of Systems  Constitutional:  Negative for chills, fatigue and unexpected weight change.  HENT:  Negative for trouble swallowing.   Eyes:  Negative for visual disturbance.  Respiratory:  Negative for cough, chest tightness, shortness of breath and wheezing.   Cardiovascular:  Negative for chest pain, palpitations and leg swelling.  Gastrointestinal:  Negative for abdominal pain, constipation and diarrhea.  Genitourinary:  Negative for dysuria, hematuria, vaginal discharge and vaginal pain (itching).  Musculoskeletal:  Negative for arthralgias and myalgias.  Neurological:  Negative for dizziness, weakness, light-headedness and headaches.     Lab Results  Component Value Date   NA 136 (A) 04/02/2023   K 5.4 (A) 04/02/2023   CO2 24 (A) 04/02/2023   GLUCOSE 188 (H) 01/23/2023   BUN 36 (A) 04/02/2023   CREATININE 1.6 (A) 04/02/2023   CALCIUM  9.1 04/02/2023   EGFR 33 04/02/2023   GFRNONAA 34 (L) 05/30/2022   Lab Results  Component Value Date   CHOL 243 (H) 01/23/2023   HDL 50 01/23/2023   LDLCALC 141 (H) 01/23/2023   LDLDIRECT 76 07/14/2020   TRIG 285 (H) 01/23/2023   CHOLHDL 4.9 (H) 01/23/2023   Lab Results  Component Value Date   TSH 5.130 (H) 01/23/2023   Lab Results  Component Value Date   HGBA1C 7.8 (A) 05/30/2023    Lab Results  Component Value Date   WBC 7.8 04/02/2023   HGB 11.8 (A) 04/02/2023   HCT 36 04/02/2023   MCV 89.3 05/30/2022   PLT 255 04/02/2023   Lab Results  Component Value Date   ALT 18 01/23/2023   AST 18 01/23/2023   ALKPHOS 113 01/23/2023   BILITOT 0.7 01/23/2023   Lab Results  Component Value Date   VD25OH 33.2 01/23/2023     Patient Active Problem List   Diagnosis Date Noted   Atherosclerosis of artery of extremity with rest pain (HCC) 01/23/2023   Type II diabetes mellitus with renal manifestations (HCC) 03/14/2022   Chronic diastolic CHF (congestive heart failure) (HCC) 03/14/2022   Obesity (BMI 30-39.9) 03/14/2022   Enteritis 03/14/2022   Benign essential tremor 12/07/2021   Coronary artery disease involving native coronary artery of native heart with angina pectoris (HCC) 05/14/2021   Atherosclerosis of native arteries of extremity with intermittent claudication (HCC) 05/15/2020   Palpitations 03/29/2020   Stable angina pectoris (HCC) 03/29/2020   Aortic atherosclerosis (HCC) 11/19/2019   Stage 3b chronic kidney disease (HCC) 12/23/2018   Restless leg syndrome 12/23/2018   Unruptured synovial cyst of popliteal space 12/23/2018   Plantar fasciitis of left foot 12/23/2018   B12 deficiency 11/26/2018   Hypogammaglobulinemia (HCC) 11/11/2018   Anemia due to stage 3b chronic kidney disease (HCC) 11/11/2018   Osteopenia determined by x-ray 02/16/2018   Major depressive disorder with single episode, in partial remission (HCC) 08/04/2017   Tinnitus  of right ear 08/05/2016   Type II diabetes mellitus with complication (HCC) 08/03/2015   Lymphedema 06/21/2015   History of paroxysmal supraventricular tachycardia 06/21/2015   PAD (peripheral artery disease) (HCC) 02/02/2015   Hyperlipidemia associated with type 2 diabetes mellitus (HCC) 09/01/2014   Neuropathy 09/01/2014   Phlebectasia 09/01/2014   Essential (primary) hypertension 09/01/2014   Spondylolisthesis  at L4-L5 level 07/05/2014   Arthritis, degenerative 01/31/2014    Allergies  Allergen Reactions   Augmentin  [Amoxicillin -Pot Clavulanate] Diarrhea   Levaquin  [Levofloxacin ] Nausea And Vomiting and Other (See Comments)    Ankle pain   Tetracycline     Other reaction(s): emesis   Cefaclor Rash   Cephalexin Rash   Rosuvastatin  Diarrhea   Sulfa Antibiotics Rash    Other reaction(s): emesis    Past Surgical History:  Procedure Laterality Date   ANGIOPLASTY / STENTING FEMORAL Left 2014, 2013   BRAIN MENINGIOMA EXCISION  1988   BREAST BIOPSY Right 1989   benign   CARPAL TUNNEL RELEASE Left 1980   CATARACT EXTRACTION W/PHACO Right 09/02/2019   Procedure: CATARACT EXTRACTION PHACO AND INTRAOCULAR LENS PLACEMENT (IOC);  Surgeon: Rosa College, MD;  Location: [facility] ORS;  Service: Ophthalmology;  Laterality: Right;  US  00:27.3 CDE 1.92 AP% Fluid Pack lot # [id number]   CATARACT EXTRACTION W/PHACO Left 10/18/2019   Procedure: CATARACT EXTRACTION PHACO AND INTRAOCULAR LENS PLACEMENT (IOC) LEFT DIABETIC 2.55  00:28.5;  Surgeon: Rosa College, MD;  Location: [facility];  Service: Ophthalmology;  Laterality: Left;  Diabetic - oral meds   CESAREAN SECTION     CHOLECYSTECTOMY  1987   COLONOSCOPY WITH PROPOFOL  N/A 05/08/2017   Procedure: COLONOSCOPY WITH PROPOFOL ;  Surgeon: Luke Salaam, MD;  Location: [facility] ENDOSCOPY;  Service: Gastroenterology;  Laterality: N/A;   KNEE ARTHROPLASTY Right 2009   LOWER EXTREMITY ANGIOGRAPHY Left 07/21/2018   Procedure: LOWER EXTREMITY ANGIOGRAPHY;  Surgeon: Jackquelyn Mass, MD;  Location: [facility] INVASIVE CV LAB;  Service: Cardiovascular;  Laterality: Left;    Social History   Tobacco Use   Smoking status: Former    Current packs/day: 0.00    Average packs/day: 0.5 packs/day for 46.0 years (23.0 ttl pk-yrs)    Types: Cigarettes    Start date: 04/06/1967    Quit date: 04/05/2013    Years since quitting: 10.2   Smokeless tobacco: Never  Vaping Use    Vaping status: Never Used  Substance Use Topics   Alcohol use: Not Currently    Alcohol/week: 2.0 standard drinks of alcohol    Types: 2 Standard drinks or equivalent per week   Drug use: Never     Medication list has been reviewed and updated.  Current Meds  Medication Sig   albuterol  (VENTOLIN  HFA) 108 (90 Base) MCG/ACT inhaler Inhale 2 puffs into the lungs every 6 (six) hours as needed. (Patient taking differently: Inhale 2 puffs into the lungs as needed.)   aspirin  81 MG chewable tablet Chew 81 mg by mouth daily.    atorvastatin  (LIPITOR) 40 MG tablet Take 1 tablet (40 mg total) by mouth daily.   blood glucose meter kit and supplies KIT Dispense based on patient and insurance preference. Use to check BS bid   Cholecalciferol  (VITAMIN D ) 50 MCG (2000 UT) CAPS Take 2 capsules by mouth daily.   cholestyramine  (QUESTRAN ) 4 GM/DOSE powder Take 1 packet (4 g total) by mouth 2 (two) times daily with a meal.   clopidogrel  (PLAVIX ) 75 MG tablet TAKE 1 TABLET BY MOUTH  DAILY   Cyanocobalamin  (VITAMIN B 12 PO) Take 1,000 mcg by mouth daily. 1000mg  daily   FARXIGA  10 MG TABS tablet TAKE 1 TABLET BY MOUTH DAILY  BEFORE BREAKFAST   fexofenadine (ALLEGRA) 180 MG tablet Take 180 mg by mouth daily.   fluconazole  (DIFLUCAN ) 100 MG tablet Take 1 tablet (100 mg total) by mouth daily for 3 days.   lansoprazole (PREVACID) 30 MG capsule Take 30 mg by mouth daily at 12 noon.   lisinopril  (ZESTRIL ) 20 MG tablet TAKE 1 TABLET BY MOUTH DAILY   loperamide (IMODIUM A-D) 2 MG tablet Take 2 mg by mouth as needed for diarrhea or loose stools.   meclizine  (ANTIVERT ) 12.5 MG tablet Take 1 tablet (12.5 mg total) by mouth 2 (two) times daily as needed for dizziness.   metoprolol  tartrate (LOPRESSOR ) 50 MG tablet Take 50 mg by mouth 2 (two) times daily.   Multiple Vitamin tablet Take 1 tablet by mouth daily.    sertraline  (ZOLOFT ) 50 MG tablet TAKE 1 TABLET BY MOUTH DAILY       06/27/2023   11:39 AM 05/30/2023     1:18 PM 01/23/2023   11:09 AM 07/30/2022    9:35 AM  GAD 7 : Generalized Anxiety Score  Nervous, Anxious, on Edge 0 0 0 0  Control/stop worrying 0 0 0 0  Worry too much - different things 0 0 0 0  Trouble relaxing 0 0 0 0  Restless 0 0 0 0  Easily annoyed or irritable 0 0 0 0  Afraid - awful might happen 0 0 0 0  Total GAD 7 Score 0 0 0 0  Anxiety Difficulty Not difficult at all Not difficult at all Not difficult at all Not difficult at all       06/27/2023   11:38 AM 05/30/2023    1:18 PM 03/13/2023   12:22 PM  Depression screen PHQ 2/9  Decreased Interest 0 0 1  Down, Depressed, Hopeless 0 0 0  PHQ - 2 Score 0 0 1  Altered sleeping 0 0 0  Tired, decreased energy 0 0 0  Change in appetite 2 0 0  Feeling bad or failure about yourself  0 0 0  Trouble concentrating 0 0 0  Moving slowly or fidgety/restless 0 0 0  Suicidal thoughts 0 0 0  PHQ-9 Score 2 0 1  Difficult doing work/chores Not difficult at all Not difficult at all Not difficult at all    BP Readings from Last 3 Encounters:  06/27/23 128/74  05/30/23 110/78  05/26/23 (!) 141/84    Physical Exam Vitals and nursing note reviewed.  Constitutional:      General: She is not in acute distress.    Appearance: Normal appearance. She is well-developed.  HENT:     Head: Normocephalic and atraumatic.  Cardiovascular:     Rate and Rhythm: Normal rate and regular rhythm.  Pulmonary:     Effort: Pulmonary effort is normal. No respiratory distress.     Breath sounds: No wheezing or rhonchi.  Abdominal:     Tenderness: There is no abdominal tenderness.  Musculoskeletal:     Cervical back: Normal range of motion.  Lymphadenopathy:     Cervical: No cervical adenopathy.  Skin:    General: Skin is warm and dry.     Findings: No rash.  Neurological:     Mental Status: She is alert and oriented to person, place, and time.  Psychiatric:  Mood and Affect: Mood normal.        Behavior: Behavior normal.    Lab Results   Component Value Date   COLORU Yellow 06/27/2023   CLARITYU clear 06/27/2023   GLUCOSEUR Positive (A) 06/27/2023   BILIRUBINUR negative 06/27/2023   KETONESU negative 06/27/2023   SPECGRAV 1.015 06/27/2023   RBCUR negative 06/27/2023   PHUR 5.0 06/27/2023   PROTEINUR Negative 06/27/2023   UROBILINOGEN 0.2 06/27/2023   LEUKOCYTESUR Negative 06/27/2023     Wt Readings from Last 3 Encounters:  06/27/23 187 lb 4 oz (84.9 kg)  05/30/23 187 lb (84.8 kg)  05/26/23 187 lb 6.4 oz (85 kg)    BP 128/74   Pulse 71   Ht 5\' 2"  (1.575 m)   Wt 187 lb 4 oz (84.9 kg)   LMP  (LMP Unknown)   SpO2 95%   BMI 34.25 kg/m   Assessment and Plan:  Problem List Items Addressed This Visit   None Visit Diagnoses       Acute vaginitis    -  Primary   highly suspect for candida - will get aptima swab treat presumptively with Diflcan; if recurrent will consider weekly preventative.   Relevant Medications   fluconazole  (DIFLUCAN ) 100 MG tablet   Other Relevant Orders   POCT urinalysis dipstick (Completed)   Cervicovaginal ancillary only       No follow-ups on file.    Sheron Dixons, MD [facility] [place]

## 2023-07-01 ENCOUNTER — Ambulatory Visit: Payer: Self-pay | Admitting: Internal Medicine

## 2023-07-01 LAB — CERVICOVAGINAL ANCILLARY ONLY
Bacterial Vaginitis (gardnerella): NEGATIVE
Candida Glabrata: NEGATIVE
Candida Vaginitis: POSITIVE — AB
Comment: NEGATIVE
Comment: NEGATIVE
Comment: NEGATIVE
Comment: NEGATIVE
Trichomonas: NEGATIVE

## 2023-07-05 ENCOUNTER — Other Ambulatory Visit (INDEPENDENT_AMBULATORY_CARE_PROVIDER_SITE_OTHER): Payer: Self-pay | Admitting: Vascular Surgery

## 2023-07-09 ENCOUNTER — Other Ambulatory Visit: Payer: Self-pay | Admitting: Internal Medicine

## 2023-07-09 DIAGNOSIS — N760 Acute vaginitis: Secondary | ICD-10-CM

## 2023-07-09 MED ORDER — FLUCONAZOLE 100 MG PO TABS: 100.0000 mg | ORAL_TABLET | Freq: Every day | ORAL | 0 refills | Status: DC

## 2023-07-09 NOTE — Progress Notes (Unsigned)
 Date:  07/09/2023   Name:  Misty Villarreal   DOB:  1950-09-17   MRN:  [id number]   Chief Complaint: No chief complaint on file.  HPI  Review of Systems   Lab Results  Component Value Date   NA 136 (A) 04/02/2023   K 5.4 (A) 04/02/2023   CO2 24 (A) 04/02/2023   GLUCOSE 188 (H) 01/23/2023   BUN 36 (A) 04/02/2023   CREATININE 1.6 (A) 04/02/2023   CALCIUM  9.1 04/02/2023   EGFR 33 04/02/2023   GFRNONAA 34 (L) 05/30/2022   Lab Results  Component Value Date   CHOL 243 (H) 01/23/2023   HDL 50 01/23/2023   LDLCALC 141 (H) 01/23/2023   LDLDIRECT 76 07/14/2020   TRIG 285 (H) 01/23/2023   CHOLHDL 4.9 (H) 01/23/2023   Lab Results  Component Value Date   TSH 5.130 (H) 01/23/2023   Lab Results  Component Value Date   HGBA1C 7.8 (A) 05/30/2023   Lab Results  Component Value Date   WBC 7.8 04/02/2023   HGB 11.8 (A) 04/02/2023   HCT 36 04/02/2023   MCV 89.3 05/30/2022   PLT 255 04/02/2023   Lab Results  Component Value Date   ALT 18 01/23/2023   AST 18 01/23/2023   ALKPHOS 113 01/23/2023   BILITOT 0.7 01/23/2023   Lab Results  Component Value Date   VD25OH 33.2 01/23/2023     Patient Active Problem List   Diagnosis Date Noted   Atherosclerosis of artery of extremity with rest pain (HCC) 01/23/2023   Type II diabetes mellitus with renal manifestations (HCC) 03/14/2022   Chronic diastolic CHF (congestive heart failure) (HCC) 03/14/2022   Obesity (BMI 30-39.9) 03/14/2022   Enteritis 03/14/2022   Benign essential tremor 12/07/2021   Coronary artery disease involving native coronary artery of native heart with angina pectoris (HCC) 05/14/2021   Atherosclerosis of native arteries of extremity with intermittent claudication (HCC) 05/15/2020   Palpitations 03/29/2020   Stable angina pectoris (HCC) 03/29/2020   Aortic atherosclerosis (HCC) 11/19/2019   Stage 3b chronic kidney disease (HCC) 12/23/2018   Restless leg syndrome 12/23/2018   Unruptured synovial cyst  of popliteal space 12/23/2018   Plantar fasciitis of left foot 12/23/2018   B12 deficiency 11/26/2018   Hypogammaglobulinemia (HCC) 11/11/2018   Anemia due to stage 3b chronic kidney disease (HCC) 11/11/2018   Osteopenia determined by x-ray 02/16/2018   Major depressive disorder with single episode, in partial remission (HCC) 08/04/2017   Tinnitus of right ear 08/05/2016   Type II diabetes mellitus with complication (HCC) 08/03/2015   Lymphedema 06/21/2015   History of paroxysmal supraventricular tachycardia 06/21/2015   PAD (peripheral artery disease) (HCC) 02/02/2015   Hyperlipidemia associated with type 2 diabetes mellitus (HCC) 09/01/2014   Neuropathy 09/01/2014   Phlebectasia 09/01/2014   Essential (primary) hypertension 09/01/2014   Spondylolisthesis at L4-L5 level 07/05/2014   Arthritis, degenerative 01/31/2014    Allergies  Allergen Reactions   Augmentin  [Amoxicillin -Pot Clavulanate] Diarrhea   Levaquin  [Levofloxacin ] Nausea And Vomiting and Other (See Comments)    Ankle pain   Tetracycline     Other reaction(s): emesis   Cefaclor Rash   Cephalexin Rash   Rosuvastatin  Diarrhea   Sulfa Antibiotics Rash    Other reaction(s): emesis    Past Surgical History:  Procedure Laterality Date   ANGIOPLASTY / STENTING FEMORAL Left 2014, 2013   BRAIN MENINGIOMA EXCISION  1988   BREAST BIOPSY Right 1989   benign   CARPAL  TUNNEL RELEASE Left 1980   CATARACT EXTRACTION W/PHACO Right 09/02/2019   Procedure: CATARACT EXTRACTION PHACO AND INTRAOCULAR LENS PLACEMENT (IOC);  Surgeon: Rosa College, MD;  Location: [facility] ORS;  Service: Ophthalmology;  Laterality: Right;  US  00:27.3 CDE 1.92 AP% Fluid Pack lot # [id number]   CATARACT EXTRACTION W/PHACO Left 10/18/2019   Procedure: CATARACT EXTRACTION PHACO AND INTRAOCULAR LENS PLACEMENT (IOC) LEFT DIABETIC 2.55  00:28.5;  Surgeon: Rosa College, MD;  Location: [facility];  Service: Ophthalmology;  Laterality: Left;   Diabetic - oral meds   CESAREAN SECTION     CHOLECYSTECTOMY  1987   COLONOSCOPY WITH PROPOFOL  N/A 05/08/2017   Procedure: COLONOSCOPY WITH PROPOFOL ;  Surgeon: Luke Salaam, MD;  Location: [facility] END[facility];  Service: Gastroenterology;  Laterality: N/A;   KNEE ARTHROPLASTY Right 2009   LOWER EXTREMITY ANGIOGRAPHY Left 07/21/2018   Procedure: LOWER EXTREMITY ANGIOGRAPHY;  Surgeon: Jackquelyn Mass, MD;  Location: [facility] INVASIVE CV LAB;  Service: Cardiovascular;  Laterality: Left;    Social History   Tobacco Use   Smoking status: Former    Current packs/day: 0.00    Average packs/day: 0.5 packs/day for 46.0 years (23.0 ttl pk-yrs)    Types: Cigarettes    Start date: 04/06/1967    Quit date: 04/05/2013    Years since quitting: 10.2   Smokeless tobacco: Never  Vaping Use   Vaping status: Never Used  Substance Use Topics   Alcohol use: Not Currently    Alcohol/week: 2.0 standard drinks of alcohol    Types: 2 Standard drinks or equivalent per week   Drug use: Never     Medication list has been reviewed and updated.  No outpatient medications have been marked as taking for the 07/09/23 encounter (Orders Only) with Sheron Dixons, MD.       06/27/2023   11:39 AM 05/30/2023    1:18 PM 01/23/2023   11:09 AM 07/30/2022    9:35 AM  GAD 7 : Generalized Anxiety Score  Nervous, Anxious, on Edge 0 0 0 0  Control/stop worrying 0 0 0 0  Worry too much - different things 0 0 0 0  Trouble relaxing 0 0 0 0  Restless 0 0 0 0  Easily annoyed or irritable 0 0 0 0  Afraid - awful might happen 0 0 0 0  Total GAD 7 Score 0 0 0 0  Anxiety Difficulty Not difficult at all Not difficult at all Not difficult at all Not difficult at all       06/27/2023   11:38 AM 05/30/2023    1:18 PM 03/13/2023   12:22 PM  Depression screen PHQ 2/9  Decreased Interest 0 0 1  Down, Depressed, Hopeless 0 0 0  PHQ - 2 Score 0 0 1  Altered sleeping 0 0 0  Tired, decreased energy 0 0 0  Change in appetite 2 0 0  Feeling  bad or failure about yourself  0 0 0  Trouble concentrating 0 0 0  Moving slowly or fidgety/restless 0 0 0  Suicidal thoughts 0 0 0  PHQ-9 Score 2 0 1  Difficult doing work/chores Not difficult at all Not difficult at all Not difficult at all    BP Readings from Last 3 Encounters:  06/27/23 128/74  05/30/23 110/78  05/26/23 (!) 141/84    Physical Exam  Wt Readings from Last 3 Encounters:  06/27/23 187 lb 4 oz (84.9 kg)  05/30/23 187 lb (84.8 kg)  05/26/23 187  lb 6.4 oz (85 kg)    LMP  (LMP Unknown)   Assessment and Plan:  Problem List Items Addressed This Visit   None   No follow-ups on file.    Sheron Dixons, MD [facility] [place]

## 2023-08-04 ENCOUNTER — Other Ambulatory Visit: Payer: Self-pay | Admitting: Internal Medicine

## 2023-08-04 DIAGNOSIS — N760 Acute vaginitis: Secondary | ICD-10-CM

## 2023-08-04 MED ORDER — FLUCONAZOLE 100 MG PO TABS: 100.0000 mg | ORAL_TABLET | Freq: Every day | ORAL | 0 refills | Status: AC

## 2023-08-04 NOTE — Progress Notes (Unsigned)
 Date:  08/04/2023   Name:  Misty Villarreal   DOB:  Jun 12, 1950   MRN:  [id number]   Chief Complaint: No chief complaint on file.  HPI  Review of Systems   Lab Results  Component Value Date   NA 136 (A) 04/02/2023   K 5.4 (A) 04/02/2023   CO2 24 (A) 04/02/2023   GLUCOSE 188 (H) 01/23/2023   BUN 36 (A) 04/02/2023   CREATININE 1.6 (A) 04/02/2023   CALCIUM  9.1 04/02/2023   EGFR 33 04/02/2023   GFRNONAA 34 (L) 05/30/2022   Lab Results  Component Value Date   CHOL 243 (H) 01/23/2023   HDL 50 01/23/2023   LDLCALC 141 (H) 01/23/2023   LDLDIRECT 76 07/14/2020   TRIG 285 (H) 01/23/2023   CHOLHDL 4.9 (H) 01/23/2023   Lab Results  Component Value Date   TSH 5.130 (H) 01/23/2023   Lab Results  Component Value Date   HGBA1C 7.8 (A) 05/30/2023   Lab Results  Component Value Date   WBC 7.8 04/02/2023   HGB 11.8 (A) 04/02/2023   HCT 36 04/02/2023   MCV 89.3 05/30/2022   PLT 255 04/02/2023   Lab Results  Component Value Date   ALT 18 01/23/2023   AST 18 01/23/2023   ALKPHOS 113 01/23/2023   BILITOT 0.7 01/23/2023   Lab Results  Component Value Date   VD25OH 33.2 01/23/2023     Patient Active Problem List   Diagnosis Date Noted   Atherosclerosis of artery of extremity with rest pain (HCC) 01/23/2023   Type II diabetes mellitus with renal manifestations (HCC) 03/14/2022   Chronic diastolic CHF (congestive heart failure) (HCC) 03/14/2022   Obesity (BMI 30-39.9) 03/14/2022   Enteritis 03/14/2022   Benign essential tremor 12/07/2021   Coronary artery disease involving native coronary artery of native heart with angina pectoris (HCC) 05/14/2021   Atherosclerosis of native arteries of extremity with intermittent claudication (HCC) 05/15/2020   Palpitations 03/29/2020   Stable angina pectoris (HCC) 03/29/2020   Aortic atherosclerosis (HCC) 11/19/2019   Stage 3b chronic kidney disease (HCC) 12/23/2018   Restless leg syndrome 12/23/2018   Unruptured synovial cyst  of popliteal space 12/23/2018   Plantar fasciitis of left foot 12/23/2018   B12 deficiency 11/26/2018   Hypogammaglobulinemia (HCC) 11/11/2018   Anemia due to stage 3b chronic kidney disease (HCC) 11/11/2018   Osteopenia determined by x-ray 02/16/2018   Major depressive disorder with single episode, in partial remission (HCC) 08/04/2017   Tinnitus of right ear 08/05/2016   Type II diabetes mellitus with complication (HCC) 08/03/2015   Lymphedema 06/21/2015   History of paroxysmal supraventricular tachycardia 06/21/2015   PAD (peripheral artery disease) (HCC) 02/02/2015   Hyperlipidemia associated with type 2 diabetes mellitus (HCC) 09/01/2014   Neuropathy 09/01/2014   Phlebectasia 09/01/2014   Essential (primary) hypertension 09/01/2014   Spondylolisthesis at L4-L5 level 07/05/2014   Arthritis, degenerative 01/31/2014    Allergies  Allergen Reactions   Augmentin  [Amoxicillin -Pot Clavulanate] Diarrhea   Levaquin  [Levofloxacin ] Nausea And Vomiting and Other (See Comments)    Ankle pain   Tetracycline     Other reaction(s): emesis   Cefaclor Rash   Cephalexin Rash   Rosuvastatin  Diarrhea   Sulfa Antibiotics Rash    Other reaction(s): emesis    Past Surgical History:  Procedure Laterality Date   ANGIOPLASTY / STENTING FEMORAL Left 2014, 2013   BRAIN MENINGIOMA EXCISION  1988   BREAST BIOPSY Right 1989   benign   CARPAL  TUNNEL RELEASE Left 1980   CATARACT EXTRACTION W/PHACO Right 09/02/2019   Procedure: CATARACT EXTRACTION PHACO AND INTRAOCULAR LENS PLACEMENT (IOC);  Surgeon: Myrna Adine Anes, MD;  Location: [facility];  Service: Ophthalmology;  Laterality: Right;  US  00:27.3 CDE 1.92 AP% Fluid Pack lot # [id number]   CATARACT EXTRACTION W/PHACO Left 10/18/2019   Procedure: CATARACT EXTRACTION PHACO AND INTRAOCULAR LENS PLACEMENT (IOC) LEFT DIABETIC 2.55  00:28.5;  Surgeon: Myrna Adine Anes, MD;  Location: [facility];  Service: Ophthalmology;  Laterality: Left;   Diabetic - oral meds   CESAREAN SECTION     CHOLECYSTECTOMY  1987   COLONOSCOPY WITH PROPOFOL  N/A 05/08/2017   Procedure: COLONOSCOPY WITH PROPOFOL ;  Surgeon: Therisa Bi, MD;  Location: [facility];  Service: Gastroenterology;  Laterality: N/A;   KNEE ARTHROPLASTY Right 2009   LOWER EXTREMITY ANGIOGRAPHY Left 07/21/2018   Procedure: LOWER EXTREMITY ANGIOGRAPHY;  Surgeon: Jama Cordella MATSU, MD;  Location: [facility];  Service: Cardiovascular;  Laterality: Left;    Social History   Tobacco Use   Smoking status: Former    Current packs/day: 0.00    Average packs/day: 0.5 packs/day for 46.0 years (23.0 ttl pk-yrs)    Types: Cigarettes    Start date: 04/06/1967    Quit date: 04/05/2013    Years since quitting: 10.3   Smokeless tobacco: Never  Vaping Use   Vaping status: Never Used  Substance Use Topics   Alcohol use: Not Currently    Alcohol/week: 2.0 standard drinks of alcohol    Types: 2 Standard drinks or equivalent per week   Drug use: Never     Medication list has been reviewed and updated.  No outpatient medications have been marked as taking for the 08/04/23 encounter (Orders Only) with Justus Leita DEL, MD.       06/27/2023   11:39 AM 05/30/2023    1:18 PM 01/23/2023   11:09 AM 07/30/2022    9:35 AM  GAD 7 : Generalized Anxiety Score  Nervous, Anxious, on Edge 0 0 0 0  Control/stop worrying 0 0 0 0  Worry too much - different things 0 0 0 0  Trouble relaxing 0 0 0 0  Restless 0 0 0 0  Easily annoyed or irritable 0 0 0 0  Afraid - awful might happen 0 0 0 0  Total GAD 7 Score 0 0 0 0  Anxiety Difficulty Not difficult at all Not difficult at all Not difficult at all Not difficult at all       06/27/2023   11:38 AM 05/30/2023    1:18 PM 03/13/2023   12:22 PM  Depression screen PHQ 2/9  Decreased Interest 0 0 1  Down, Depressed, Hopeless 0 0 0  PHQ - 2 Score 0 0 1  Altered sleeping 0 0 0  Tired, decreased energy 0 0 0  Change in appetite 2 0 0  Feeling  bad or failure about yourself  0 0 0  Trouble concentrating 0 0 0  Moving slowly or fidgety/restless 0 0 0  Suicidal thoughts 0 0 0  PHQ-9 Score 2 0 1  Difficult doing work/chores Not difficult at all Not difficult at all Not difficult at all    BP Readings from Last 3 Encounters:  06/27/23 128/74  05/30/23 110/78  05/26/23 (!) 141/84    Physical Exam  Wt Readings from Last 3 Encounters:  06/27/23 187 lb 4 oz (84.9 kg)  05/30/23 187 lb (84.8 kg)  05/26/23 187  lb 6.4 oz (85 kg)    LMP  (LMP Unknown)   Assessment and Plan:  Problem List Items Addressed This Visit   None   No follow-ups on file.    Leita HILARIO Adie, MD [facility] [place]

## 2023-08-11 ENCOUNTER — Other Ambulatory Visit: Payer: Self-pay | Admitting: Internal Medicine

## 2023-08-11 DIAGNOSIS — B3731 Acute candidiasis of vulva and vagina: Secondary | ICD-10-CM

## 2023-08-11 MED ORDER — FLUCONAZOLE 100 MG PO TABS: 100.0000 mg | ORAL_TABLET | ORAL | 3 refills | Status: AC

## 2023-08-11 NOTE — Progress Notes (Unsigned)
 Date:  08/11/2023   Name:  Misty Villarreal   DOB:  09/30/1950   MRN:  [id number]   Chief Complaint: No chief complaint on file.  HPI  Review of Systems   Lab Results  Component Value Date   NA 136 (A) 04/02/2023   K 5.4 (A) 04/02/2023   CO2 24 (A) 04/02/2023   GLUCOSE 188 (H) 01/23/2023   BUN 36 (A) 04/02/2023   CREATININE 1.6 (A) 04/02/2023   CALCIUM  9.1 04/02/2023   EGFR 33 04/02/2023   GFRNONAA 34 (L) 05/30/2022   Lab Results  Component Value Date   CHOL 243 (H) 01/23/2023   HDL 50 01/23/2023   LDLCALC 141 (H) 01/23/2023   LDLDIRECT 76 07/14/2020   TRIG 285 (H) 01/23/2023   CHOLHDL 4.9 (H) 01/23/2023   Lab Results  Component Value Date   TSH 5.130 (H) 01/23/2023   Lab Results  Component Value Date   HGBA1C 7.8 (A) 05/30/2023   Lab Results  Component Value Date   WBC 7.8 04/02/2023   HGB 11.8 (A) 04/02/2023   HCT 36 04/02/2023   MCV 89.3 05/30/2022   PLT 255 04/02/2023   Lab Results  Component Value Date   ALT 18 01/23/2023   AST 18 01/23/2023   ALKPHOS 113 01/23/2023   BILITOT 0.7 01/23/2023   Lab Results  Component Value Date   VD25OH 33.2 01/23/2023     Patient Active Problem List   Diagnosis Date Noted   Atherosclerosis of artery of extremity with rest pain (HCC) 01/23/2023   Type II diabetes mellitus with renal manifestations (HCC) 03/14/2022   Chronic diastolic CHF (congestive heart failure) (HCC) 03/14/2022   Obesity (BMI 30-39.9) 03/14/2022   Enteritis 03/14/2022   Benign essential tremor 12/07/2021   Coronary artery disease involving native coronary artery of native heart with angina pectoris (HCC) 05/14/2021   Atherosclerosis of native arteries of extremity with intermittent claudication (HCC) 05/15/2020   Palpitations 03/29/2020   Stable angina pectoris (HCC) 03/29/2020   Aortic atherosclerosis (HCC) 11/19/2019   Stage 3b chronic kidney disease (HCC) 12/23/2018   Restless leg syndrome 12/23/2018   Unruptured synovial cyst  of popliteal space 12/23/2018   Plantar fasciitis of left foot 12/23/2018   B12 deficiency 11/26/2018   Hypogammaglobulinemia (HCC) 11/11/2018   Anemia due to stage 3b chronic kidney disease (HCC) 11/11/2018   Osteopenia determined by x-ray 02/16/2018   Major depressive disorder with single episode, in partial remission (HCC) 08/04/2017   Tinnitus of right ear 08/05/2016   Type II diabetes mellitus with complication (HCC) 08/03/2015   Lymphedema 06/21/2015   History of paroxysmal supraventricular tachycardia 06/21/2015   PAD (peripheral artery disease) (HCC) 02/02/2015   Hyperlipidemia associated with type 2 diabetes mellitus (HCC) 09/01/2014   Neuropathy 09/01/2014   Phlebectasia 09/01/2014   Essential (primary) hypertension 09/01/2014   Spondylolisthesis at L4-L5 level 07/05/2014   Arthritis, degenerative 01/31/2014    Allergies  Allergen Reactions   Augmentin  [Amoxicillin -Pot Clavulanate] Diarrhea   Levaquin  [Levofloxacin ] Nausea And Vomiting and Other (See Comments)    Ankle pain   Tetracycline     Other reaction(s): emesis   Cefaclor Rash   Cephalexin Rash   Rosuvastatin  Diarrhea   Sulfa Antibiotics Rash    Other reaction(s): emesis    Past Surgical History:  Procedure Laterality Date   ANGIOPLASTY / STENTING FEMORAL Left 2014, 2013   BRAIN MENINGIOMA EXCISION  1988   BREAST BIOPSY Right 1989   benign   CARPAL  TUNNEL RELEASE Left 1980   CATARACT EXTRACTION W/PHACO Right 09/02/2019   Procedure: CATARACT EXTRACTION PHACO AND INTRAOCULAR LENS PLACEMENT (IOC);  Surgeon: Myrna Adine Anes, MD;  Location: [facility];  Service: Ophthalmology;  Laterality: Right;  US  00:27.3 CDE 1.92 AP% Fluid Pack lot # [id number]   CATARACT EXTRACTION W/PHACO Left 10/18/2019   Procedure: CATARACT EXTRACTION PHACO AND INTRAOCULAR LENS PLACEMENT (IOC) LEFT DIABETIC 2.55  00:28.5;  Surgeon: Myrna Adine Anes, MD;  Location: [facility];  Service: Ophthalmology;  Laterality: Left;   Diabetic - oral meds   CESAREAN SECTION     CHOLECYSTECTOMY  1987   COLONOSCOPY WITH PROPOFOL  N/A 05/08/2017   Procedure: COLONOSCOPY WITH PROPOFOL ;  Surgeon: Therisa Bi, MD;  Location: [facility];  Service: Gastroenterology;  Laterality: N/A;   KNEE ARTHROPLASTY Right 2009   LOWER EXTREMITY ANGIOGRAPHY Left 07/21/2018   Procedure: LOWER EXTREMITY ANGIOGRAPHY;  Surgeon: Jama Cordella MATSU, MD;  Location: [facility] INV[facility];  Service: Cardiovascular;  Laterality: Left;    Social History   Tobacco Use   Smoking status: Former    Current packs/day: 0.00    Average packs/day: 0.5 packs/day for 46.0 years (23.0 ttl pk-yrs)    Types: Cigarettes    Start date: 04/06/1967    Quit date: 04/05/2013    Years since quitting: 10.3   Smokeless tobacco: Never  Vaping Use   Vaping status: Never Used  Substance Use Topics   Alcohol use: Not Currently    Alcohol/week: 2.0 standard drinks of alcohol    Types: 2 Standard drinks or equivalent per week   Drug use: Never     Medication list has been reviewed and updated.  No outpatient medications have been marked as taking for the 08/11/23 encounter (Orders Only) with Justus Leita DEL, MD.       06/27/2023   11:39 AM 05/30/2023    1:18 PM 01/23/2023   11:09 AM 07/30/2022    9:35 AM  GAD 7 : Generalized Anxiety Score  Nervous, Anxious, on Edge 0 0 0 0  Control/stop worrying 0 0 0 0  Worry too much - different things 0 0 0 0  Trouble relaxing 0 0 0 0  Restless 0 0 0 0  Easily annoyed or irritable 0 0 0 0  Afraid - awful might happen 0 0 0 0  Total GAD 7 Score 0 0 0 0  Anxiety Difficulty Not difficult at all Not difficult at all Not difficult at all Not difficult at all       06/27/2023   11:38 AM 05/30/2023    1:18 PM 03/13/2023   12:22 PM  Depression screen PHQ 2/9  Decreased Interest 0 0 1  Down, Depressed, Hopeless 0 0 0  PHQ - 2 Score 0 0 1  Altered sleeping 0 0 0  Tired, decreased energy 0 0 0  Change in appetite 2 0 0  Feeling  bad or failure about yourself  0 0 0  Trouble concentrating 0 0 0  Moving slowly or fidgety/restless 0 0 0  Suicidal thoughts 0 0 0  PHQ-9 Score 2 0 1  Difficult doing work/chores Not difficult at all Not difficult at all Not difficult at all    BP Readings from Last 3 Encounters:  06/27/23 128/74  05/30/23 110/78  05/26/23 (!) 141/84    Physical Exam  Wt Readings from Last 3 Encounters:  06/27/23 187 lb 4 oz (84.9 kg)  05/30/23 187 lb (84.8 kg)  05/26/23 187  lb 6.4 oz (85 kg)    LMP  (LMP Unknown)   Assessment and Plan:  Problem List Items Addressed This Visit   None   No follow-ups on file.    Leita HILARIO Adie, MD [facility] [place]

## 2023-08-13 LAB — COMPREHENSIVE METABOLIC PANEL WITH GFR: Calcium: 9.4 (ref 8.7–10.7)

## 2023-08-13 LAB — BASIC METABOLIC PANEL WITH GFR
BUN: 35 — AB (ref 4–21)
CO2: 23 — AB (ref 13–22)
Chloride: 103 (ref 99–108)
Creatinine: 1.8 — AB (ref 0.5–1.1)
Potassium: 5.3 meq/L — AB (ref 3.5–5.1)
Sodium: 137 (ref 137–147)

## 2023-08-13 LAB — MICROALBUMIN, URINE: Microalb, Ur: 1.7

## 2023-08-13 LAB — PROTEIN / CREATININE RATIO, URINE: Creatinine, Urine: 93

## 2023-08-13 LAB — MICROALBUMIN / CREATININE URINE RATIO: Microalb Creat Ratio: 18

## 2023-08-25 ENCOUNTER — Other Ambulatory Visit: Payer: Self-pay | Admitting: Internal Medicine

## 2023-08-26 NOTE — Telephone Encounter (Signed)
 Requested medications are due for refill today.  yes  Requested medications are on the active medications list.  yes  Last refill. 01/23/2023 #180 1 rf  Future visit scheduled.   yes  Notes to clinic.  Refill not delegated.    Requested Prescriptions  Pending Prescriptions Disp Refills   meclizine  (ANTIVERT ) 12.5 MG tablet [Pharmacy Med Name: MECLIZINE   12.5MG   TAB] 180 tablet 1    Sig: TAKE 1 TABLET BY MOUTH TWICE  DAILY AS NEEDED FOR DIZZINESS     Not Delegated - Gastroenterology: Antiemetics Failed - 08/26/2023  5:52 PM      Failed - This refill cannot be delegated      Passed - Valid encounter within last 6 months    Recent Outpatient Visits           2 months ago Acute vaginitis   [facility], Leita DEL, MD   2 months ago Essential (primary) hypertension   [facility] at [facility], Leita DEL, MD

## 2023-08-27 ENCOUNTER — Other Ambulatory Visit: Payer: Self-pay

## 2023-09-30 ENCOUNTER — Encounter: Payer: Self-pay | Admitting: Internal Medicine

## 2023-09-30 ENCOUNTER — Ambulatory Visit (INDEPENDENT_AMBULATORY_CARE_PROVIDER_SITE_OTHER): Admitting: Internal Medicine

## 2023-09-30 VITALS — BP 122/84 | HR 62 | Ht 62.0 in | Wt 186.0 lb

## 2023-09-30 DIAGNOSIS — E118 Type 2 diabetes mellitus with unspecified complications: Secondary | ICD-10-CM | POA: Diagnosis not present

## 2023-09-30 DIAGNOSIS — I10 Essential (primary) hypertension: Secondary | ICD-10-CM | POA: Diagnosis not present

## 2023-09-30 DIAGNOSIS — Z1231 Encounter for screening mammogram for malignant neoplasm of breast: Secondary | ICD-10-CM | POA: Diagnosis not present

## 2023-09-30 DIAGNOSIS — G250 Essential tremor: Secondary | ICD-10-CM

## 2023-09-30 DIAGNOSIS — Z7984 Long term (current) use of oral hypoglycemic drugs: Secondary | ICD-10-CM

## 2023-09-30 LAB — POCT GLYCOSYLATED HEMOGLOBIN (HGB A1C): Hemoglobin A1C: 7.7 % — AB (ref 4.0–5.6)

## 2023-09-30 MED ORDER — MOUNJARO 2.5 MG/0.5ML ~~LOC~~ SOAJ: 2.5000 mg | SUBCUTANEOUS | 0 refills | Status: DC

## 2023-09-30 NOTE — Assessment & Plan Note (Signed)
 Becoming more symptomatic. She has gabapentin  100 mg at home - try one in the AM -

## 2023-09-30 NOTE — Assessment & Plan Note (Signed)
 Blood pressure is well controlled on metoprolol  and lisinopril . No medication side effects noted. Plan to continue current medications.

## 2023-09-30 NOTE — Progress Notes (Signed)
 Date:  09/30/2023   Name:  Misty Villarreal   DOB:  07-05-1950   MRN:  [id number]   Chief Complaint: Diabetes (Constant yeast infections with farxiga  wants to try mounjaro  ) and Hypertension  Hypertension Pertinent negatives include no chest pain, headaches, palpitations or shortness of breath.  Diabetes She presents for her follow-up diabetic visit. She has type 2 diabetes mellitus. Her disease course has been stable. Hypoglycemia symptoms include tremors. Pertinent negatives for hypoglycemia include no headaches or nervousness/anxiousness. Pertinent negatives for diabetes include no chest pain, no fatigue, no polydipsia and no polyuria. Symptoms are stable. Current diabetic treatments: Farxiga . She is compliant with treatment all of the time.  Tremor - of both hands, becoming more problematic and interfering with ADLs.  She wants to know if anything can be done that is no medication.  Review of Systems  Constitutional:  Negative for appetite change, fatigue, fever and unexpected weight change.  HENT:  Negative for tinnitus and trouble swallowing.   Eyes:  Negative for visual disturbance.  Respiratory:  Negative for cough, chest tightness and shortness of breath.   Cardiovascular:  Negative for chest pain, palpitations and leg swelling.  Gastrointestinal:  Negative for abdominal pain.  Endocrine: Negative for polydipsia and polyuria.  Genitourinary:  Negative for dysuria and hematuria.  Musculoskeletal:  Negative for arthralgias.  Neurological:  Positive for tremors. Negative for numbness and headaches.  Psychiatric/Behavioral:  Negative for dysphoric mood and sleep disturbance. The patient is not nervous/anxious.      Lab Results  Component Value Date   NA 137 08/13/2023   K 5.3 (A) 08/13/2023   CO2 23 (A) 08/13/2023   GLUCOSE 188 (H) 01/23/2023   BUN 35 (A) 08/13/2023   CREATININE 1.8 (A) 08/13/2023   CALCIUM  9.4 08/13/2023   EGFR 33 04/02/2023   GFRNONAA 34 (L) 05/30/2022    Lab Results  Component Value Date   CHOL 243 (H) 01/23/2023   HDL 50 01/23/2023   LDLCALC 141 (H) 01/23/2023   LDLDIRECT 76 07/14/2020   TRIG 285 (H) 01/23/2023   CHOLHDL 4.9 (H) 01/23/2023   Lab Results  Component Value Date   TSH 5.130 (H) 01/23/2023   Lab Results  Component Value Date   HGBA1C 7.7 (A) 09/30/2023   Lab Results  Component Value Date   WBC 7.8 04/02/2023   HGB 11.8 (A) 04/02/2023   HCT 36 04/02/2023   MCV 89.3 05/30/2022   PLT 255 04/02/2023   Lab Results  Component Value Date   ALT 18 01/23/2023   AST 18 01/23/2023   ALKPHOS 113 01/23/2023   BILITOT 0.7 01/23/2023   Lab Results  Component Value Date   VD25OH 33.2 01/23/2023     Patient Active Problem List   Diagnosis Date Noted   Atherosclerosis of artery of extremity with rest pain (HCC) 01/23/2023   Type II diabetes mellitus with renal manifestations (HCC) 03/14/2022   Chronic diastolic CHF (congestive heart failure) (HCC) 03/14/2022   Obesity (BMI 30-39.9) 03/14/2022   Enteritis 03/14/2022   Benign essential tremor 12/07/2021   Coronary artery disease involving native coronary artery of native heart with angina pectoris (HCC) 05/14/2021   Atherosclerosis of native arteries of extremity with intermittent claudication (HCC) 05/15/2020   Palpitations 03/29/2020   Stable angina pectoris (HCC) 03/29/2020   Aortic atherosclerosis (HCC) 11/19/2019   Stage 3b chronic kidney disease (HCC) 12/23/2018   Restless leg syndrome 12/23/2018   Unruptured synovial cyst of popliteal space 12/23/2018  Plantar fasciitis of left foot 12/23/2018   B12 deficiency 11/26/2018   Hypogammaglobulinemia (HCC) 11/11/2018   Anemia due to stage 3b chronic kidney disease (HCC) 11/11/2018   Osteopenia determined by x-ray 02/16/2018   Major depressive disorder with single episode, in partial remission (HCC) 08/04/2017   Tinnitus of right ear 08/05/2016   Type II diabetes mellitus with complication (HCC) 08/03/2015    Lymphedema 06/21/2015   History of paroxysmal supraventricular tachycardia 06/21/2015   PAD (peripheral artery disease) (HCC) 02/02/2015   Hyperlipidemia associated with type 2 diabetes mellitus (HCC) 09/01/2014   Neuropathy 09/01/2014   Phlebectasia 09/01/2014   Essential (primary) hypertension 09/01/2014   Spondylolisthesis at L4-L5 level 07/05/2014   Arthritis, degenerative 01/31/2014    Allergies  Allergen Reactions   Augmentin  [Amoxicillin -Pot Clavulanate] Diarrhea   Levaquin  [Levofloxacin ] Nausea And Vomiting and Other (See Comments)    Ankle pain   Tetracycline     Other reaction(s): emesis   Cefaclor Rash   Cephalexin Rash   Rosuvastatin  Diarrhea   Sulfa Antibiotics Rash    Other reaction(s): emesis    Past Surgical History:  Procedure Laterality Date   ANGIOPLASTY / STENTING FEMORAL Left 2014, 2013   BRAIN MENINGIOMA EXCISION  1988   BREAST BIOPSY Right 1989   benign   CARPAL TUNNEL RELEASE Left 1980   CATARACT EXTRACTION W/PHACO Right 09/02/2019   Procedure: CATARACT EXTRACTION PHACO AND INTRAOCULAR LENS PLACEMENT (IOC);  Surgeon: Myrna Adine Anes, MD;  Location: [facility] ORS;  Service: Ophthalmology;  Laterality: Right;  US  00:27.3 CDE 1.92 AP% Fluid Pack lot # [id number]   CATARACT EXTRACTION W/PHACO Left 10/18/2019   Procedure: CATARACT EXTRACTION PHACO AND INTRAOCULAR LENS PLACEMENT (IOC) LEFT DIABETIC 2.55  00:28.5;  Surgeon: Myrna Adine Anes, MD;  Location: [facility];  Service: Ophthalmology;  Laterality: Left;  Diabetic - oral meds   CESAREAN SECTION     CHOLECYSTECTOMY  1987   COLONOSCOPY WITH PROPOFOL  N/A 05/08/2017   Procedure: COLONOSCOPY WITH PROPOFOL ;  Surgeon: Therisa Bi, MD;  Location: [facility];  Service: Gastroenterology;  Laterality: N/A;   KNEE ARTHROPLASTY Right 2009   LOWER EXTREMITY ANGIOGRAPHY Left 07/21/2018   Procedure: LOWER EXTREMITY ANGIOGRAPHY;  Surgeon: Jama Cordella MATSU, MD;  Location: [facility] INVASIVE CV LAB;  Service:  Cardiovascular;  Laterality: Left;    Social History   Tobacco Use   Smoking status: Former    Current packs/day: 0.00    Average packs/day: 0.5 packs/day for 46.0 years (23.0 ttl pk-yrs)    Types: Cigarettes    Start date: 04/06/1967    Quit date: 04/05/2013    Years since quitting: 10.4   Smokeless tobacco: Never  Vaping Use   Vaping status: Never Used  Substance Use Topics   Alcohol use: Not Currently    Alcohol/week: 2.0 standard drinks of alcohol    Types: 2 Standard drinks or equivalent per week   Drug use: Never     Medication list has been reviewed and updated.  Current Meds  Medication Sig   albuterol  (VENTOLIN  HFA) 108 (90 Base) MCG/ACT inhaler Inhale 2 puffs into the lungs every 6 (six) hours as needed.   aspirin  81 MG chewable tablet Chew 81 mg by mouth daily.    atorvastatin  (LIPITOR) 40 MG tablet Take 1 tablet (40 mg total) by mouth daily.   blood glucose meter kit and supplies KIT Dispense based on patient and insurance preference. Use to check BS bid   Cholecalciferol  (VITAMIN D ) 50 MCG (2000 UT) CAPS  Take 2 capsules by mouth daily.   cholestyramine  (QUESTRAN ) 4 GM/DOSE powder Take 1 packet (4 g total) by mouth 2 (two) times daily with a meal.   clopidogrel  (PLAVIX ) 75 MG tablet TAKE 1 TABLET BY MOUTH DAILY   Cyanocobalamin  (VITAMIN B 12 PO) Take 1,000 mcg by mouth daily. 1000mg  daily   fexofenadine (ALLEGRA) 180 MG tablet Take 180 mg by mouth daily.   lansoprazole (PREVACID) 30 MG capsule Take 30 mg by mouth daily at 12 noon.   lisinopril  (ZESTRIL ) 20 MG tablet TAKE 1 TABLET BY MOUTH DAILY   loperamide (IMODIUM A-D) 2 MG tablet Take 2 mg by mouth as needed for diarrhea or loose stools.   meclizine  (ANTIVERT ) 12.5 MG tablet TAKE 1 TABLET BY MOUTH TWICE  DAILY AS NEEDED FOR DIZZINESS   metoprolol  tartrate (LOPRESSOR ) 50 MG tablet Take 50 mg by mouth 2 (two) times daily.   Multiple Vitamin tablet Take 1 tablet by mouth daily.    sertraline  (ZOLOFT ) 50 MG tablet  TAKE 1 TABLET BY MOUTH DAILY   tirzepatide  (MOUNJARO ) 2.5 MG/0.5ML Pen Inject 2.5 mg into the skin once a week.   [DISCONTINUED] FARXIGA  10 MG TABS tablet TAKE 1 TABLET BY MOUTH DAILY  BEFORE BREAKFAST       09/30/2023    1:17 PM 06/27/2023   11:39 AM 05/30/2023    1:18 PM 01/23/2023   11:09 AM  GAD 7 : Generalized Anxiety Score  Nervous, Anxious, on Edge 0 0 0 0  Control/stop worrying 0 0 0 0  Worry too much - different things 0 0 0 0  Trouble relaxing 0 0 0 0  Restless 0 0 0 0  Easily annoyed or irritable 0 0 0 0  Afraid - awful might happen 0 0 0 0  Total GAD 7 Score 0 0 0 0  Anxiety Difficulty Not difficult at all Not difficult at all Not difficult at all Not difficult at all       09/30/2023    1:17 PM 06/27/2023   11:38 AM 05/30/2023    1:18 PM  Depression screen PHQ 2/9  Decreased Interest 0 0 0  Down, Depressed, Hopeless 0 0 0  PHQ - 2 Score 0 0 0  Altered sleeping 0 0 0  Tired, decreased energy 0 0 0  Change in appetite 0 2 0  Feeling bad or failure about yourself  0 0 0  Trouble concentrating 0 0 0  Moving slowly or fidgety/restless 0 0 0  Suicidal thoughts 0 0 0  PHQ-9 Score 0 2 0  Difficult doing work/chores Not difficult at all Not difficult at all Not difficult at all    BP Readings from Last 3 Encounters:  09/30/23 122/84  06/27/23 128/74  05/30/23 110/78    Physical Exam Vitals and nursing note reviewed.  Constitutional:      General: She is not in acute distress.    Appearance: She is well-developed.  HENT:     Head: Normocephalic and atraumatic.  Neck:     Vascular: No carotid bruit.  Cardiovascular:     Rate and Rhythm: Normal rate and regular rhythm.  Pulmonary:     Effort: Pulmonary effort is normal. No respiratory distress.     Breath sounds: No wheezing or rhonchi.  Musculoskeletal:     Cervical back: Normal range of motion.     Right lower leg: No edema.     Left lower leg: No edema.  Lymphadenopathy:  Cervical: No cervical adenopathy.   Skin:    General: Skin is warm and dry.     Findings: No rash.  Neurological:     General: No focal deficit present.     Mental Status: She is alert and oriented to person, place, and time.     Motor: Tremor present.     Comments: Fine tremor of both hands   Psychiatric:        Mood and Affect: Mood normal.        Behavior: Behavior normal.     Wt Readings from Last 3 Encounters:  09/30/23 186 lb (84.4 kg)  06/27/23 187 lb 4 oz (84.9 kg)  05/30/23 187 lb (84.8 kg)    BP 122/84   Pulse 62   Ht 5' 2 (1.575 m)   Wt 186 lb (84.4 kg)   LMP  (LMP Unknown)   SpO2 98%   BMI 34.02 kg/m   Assessment and Plan:  Problem List Items Addressed This Visit       Unprioritized   Essential (primary) hypertension (Chronic)   Blood pressure is well controlled on metoprolol  and lisinopril . No medication side effects noted. Plan to continue current medications.       Type II diabetes mellitus with complication (HCC) - Primary (Chronic)   Blood sugars have been stable.  No hypoglycemic events since last visit. Currently medications are Farxiga  but yeast infections are recurrent and not controlled with prn Diflucan . Last visit medical regimen changes were none.  A1C had improved from 8.3 Lab Results  Component Value Date   HGBA1C 7.8 (A) 05/30/2023  A1C today =  7.7 Will stop Farxiga  and begin Mounjaro .       Relevant Medications   tirzepatide  (MOUNJARO ) 2.5 MG/0.5ML Pen   Other Relevant Orders   POCT glycosylated hemoglobin (Hb A1C) (Completed)   Benign essential tremor   Becoming more symptomatic. She has gabapentin  100 mg at home - try one in the AM -      Other Visit Diagnoses       Long term current use of oral hypoglycemic drug         Encounter for screening mammogram for breast cancer       reminded to schedule her annual mammogram       Return in about 3 months (around 12/30/2023) for DM.    Leita HILARIO Adie, MD [facility] Primary [facility] and Sports  Medicine [place]

## 2023-09-30 NOTE — Patient Instructions (Signed)
 Call [facility] to schedule your mammogram at [phone].

## 2023-09-30 NOTE — Assessment & Plan Note (Addendum)
 Blood sugars have been stable.  No hypoglycemic events since last visit. Currently medications are Farxiga  but yeast infections are recurrent and not controlled with prn Diflucan . Last visit medical regimen changes were none.  A1C had improved from 8.3 Lab Results  Component Value Date   HGBA1C 7.8 (A) 05/30/2023  A1C today =  7.7 Will stop Farxiga  and begin Mounjaro .

## 2023-10-24 ENCOUNTER — Other Ambulatory Visit: Payer: Self-pay | Admitting: Internal Medicine

## 2023-10-24 DIAGNOSIS — F32A Depression, unspecified: Secondary | ICD-10-CM

## 2023-10-24 DIAGNOSIS — I10 Essential (primary) hypertension: Secondary | ICD-10-CM

## 2023-10-27 ENCOUNTER — Other Ambulatory Visit: Payer: Self-pay | Admitting: Internal Medicine

## 2023-10-27 ENCOUNTER — Telehealth: Payer: Self-pay

## 2023-10-27 DIAGNOSIS — E118 Type 2 diabetes mellitus with unspecified complications: Secondary | ICD-10-CM

## 2023-10-27 MED ORDER — TIRZEPATIDE 5 MG/0.5ML ~~LOC~~ SOAJ: 5.0000 mg | SUBCUTANEOUS | 0 refills | Status: DC

## 2023-10-27 NOTE — Telephone Encounter (Signed)
 Requested medication (s) are due for refill today - no  Requested medication (s) are on the active medication list -yes  Future visit scheduled -yes  Last refill: 08/27/23 #180  Notes to clinic: non delegated Rx  Requested Prescriptions  Pending Prescriptions Disp Refills   meclizine  (ANTIVERT ) 12.5 MG tablet [Pharmacy Med Name: MECLIZINE   12.5MG   TAB] 180 tablet 0    Sig: TAKE 1 TABLET BY MOUTH TWICE  DAILY AS NEEDED FOR DIZZINESS     Not Delegated - Gastroenterology: Antiemetics Failed - 10/27/2023 12:17 PM      Failed - This refill cannot be delegated      Passed - Valid encounter within last 6 months    Recent Outpatient Visits           3 weeks ago Type II diabetes mellitus with complication [facility])   [place] [facility] Medicine at [facility], Leita DEL, MD   4 months ago Acute vaginitis   [place] [facility] at [facility], Leita DEL, MD   5 months ago Essential (primary) hypertension   [place] [facility] & Sports Medicine at [facility], Leita DEL, MD       Future Appointments             In 2 months Justus Leita DEL, MD [facility] Care & [facility] Medicine at [facility], [phone] [place]            Signed Prescriptions Disp Refills   lisinopril  (ZESTRIL ) 20 MG tablet 90 tablet 0    Sig: TAKE 1 TABLET BY MOUTH DAILY     Cardiovascular:  ACE Inhibitors Failed - 10/27/2023 12:17 PM      Failed - Cr in normal range and within 180 days    Creatinine  Date Value Ref Range Status  08/13/2023 1.8 (A) 0.5 - 1.1 Final  04/27/2013 1.28 0.60 - 1.30 mg/dL Final   Creatinine, Ser  Date Value Ref Range Status  01/23/2023 1.53 (H) 0.57 - 1.00 mg/dL Final   Creatinine, Urine  Date Value Ref Range Status  08/13/2023 93  Final         Failed - K in normal range and within 180 days    Potassium  Date Value Ref Range Status  08/13/2023 5.3 (A) 3.5 - 5.1 mEq/L Final  04/27/2013 4.2  3.5 - 5.1 mmol/L Final         Passed - Patient is not pregnant      Passed - Last BP in normal range    BP Readings from Last 1 Encounters:  09/30/23 122/84         Passed - Valid encounter within last 6 months    Recent Outpatient Visits           3 weeks ago Type II diabetes mellitus with complication [facility])   [place] [facility] Medicine at [facility], Leita DEL, MD   4 months ago Acute vaginitis   [facility] at [facility], Leita DEL, MD   5 months ago Essential (primary) hypertension   [place] Primary Care & Sports Medicine at [facility], Leita DEL, MD       Future Appointments             In 2 months Justus Leita DEL, MD [facility] Primary Care & Sports Medicine at [facility], [phone] [place]  sertraline  (ZOLOFT ) 50 MG tablet 90 tablet 0    Sig: TAKE 1 TABLET BY MOUTH DAILY     Psychiatry:  Antidepressants - SSRI - sertraline  Passed - 10/27/2023 12:17 PM      Passed - AST in normal range and within 360 days    AST  Date Value Ref Range Status  01/23/2023 18 0 - 40 IU/L Final         Passed - ALT in normal range and within 360 days    ALT  Date Value Ref Range Status  01/23/2023 18 0 - 32 IU/L Final         Passed - Completed PHQ-2 or PHQ-9 in the last 360 days      Passed - Valid encounter within last 6 months    Recent Outpatient Visits           3 weeks ago Type II diabetes mellitus with complication [facility])   [place] Primary Care & Sports Medicine at [facility], Leita DEL, MD   4 months ago Acute vaginitis   [place] Primary Care & Sports Medicine at [facility], Leita DEL, MD   5 months ago Essential (primary) hypertension   [place] Primary Care & Sports Medicine at [facility], Leita DEL, MD       Future Appointments             In 2 months Justus Leita DEL, MD [facility] Primary Care &  Sports Medicine at [facility], [phone] [place]               Requested Prescriptions  Pending Prescriptions Disp Refills   meclizine  (ANTIVERT ) 12.5 MG tablet [Pharmacy Med Name: MECLIZINE   12.5MG   TAB] 180 tablet 0    Sig: TAKE 1 TABLET BY MOUTH TWICE  DAILY AS NEEDED FOR DIZZINESS     Not Delegated - Gastroenterology: Antiemetics Failed - 10/27/2023 12:17 PM      Failed - This refill cannot be delegated      Passed - Valid encounter within last 6 months    Recent Outpatient Visits           3 weeks ago Type II diabetes mellitus with complication [facility])   [place] [facility] Care & Sports Medicine at [facility], Leita DEL, MD   4 months ago Acute vaginitis   [place] Primary Care & Sports Medicine at [facility], Leita DEL, MD   5 months ago Essential (primary) hypertension   [place] Primary Care & Sports Medicine at [facility], Leita DEL, MD       Future Appointments             In 2 months Justus Leita DEL, MD [facility] Primary Care & Sports Medicine at [facility], [phone] [place]            Signed Prescriptions Disp Refills   lisinopril  (ZESTRIL ) 20 MG tablet 90 tablet 0    Sig: TAKE 1 TABLET BY MOUTH DAILY     Cardiovascular:  ACE Inhibitors Failed - 10/27/2023 12:17 PM      Failed - Cr in normal range and within 180 days    Creatinine  Date Value Ref Range Status  08/13/2023 1.8 (A) 0.5 - 1.1 Final  04/27/2013 1.28 0.60 - 1.30 mg/dL Final   Creatinine, Ser  Date Value Ref Range Status  01/23/2023 1.53 (H) 0.57 - 1.00 mg/dL Final   Creatinine, Urine  Date  Value Ref Range Status  08/13/2023 93  Final         Failed - K in normal range and within 180 days    Potassium  Date Value Ref Range Status  08/13/2023 5.3 (A) 3.5 - 5.1 mEq/L Final  04/27/2013 4.2 3.5 - 5.1 mmol/L Final         Passed - Patient is not pregnant      Passed - Last BP in normal range    BP Readings from Last 1 Encounters:   09/30/23 122/84         Passed - Valid encounter within last 6 months    Recent Outpatient Visits           3 weeks ago Type II diabetes mellitus with complication [facility])   [place]  [facility] Medicine at [facility], Leita DEL, MD   4 months ago Acute vaginitis   [place] [facility] Sports Medicine at [facility], Leita DEL, MD   5 months ago Essential (primary) hypertension   [place] Primary Care & Sports Medicine at [facility], Leita DEL, MD       Future Appointments             In 2 months Justus Leita DEL, MD [facility] Medicine at [facility], [phone] [place]             sertraline  (ZOLOFT ) 50 MG tablet 90 tablet 0    Sig: TAKE 1 TABLET BY MOUTH DAILY     Psychiatry:  Antidepressants - SSRI - sertraline  Passed - 10/27/2023 12:17 PM      Passed - AST in normal range and within 360 days    AST  Date Value Ref Range Status  01/23/2023 18 0 - 40 IU/L Final         Passed - ALT in normal range and within 360 days    ALT  Date Value Ref Range Status  01/23/2023 18 0 - 32 IU/L Final         Passed - Completed PHQ-2 or PHQ-9 in the last 360 days      Passed - Valid encounter within last 6 months    Recent Outpatient Visits           3 weeks ago Type II diabetes mellitus with complication [facility])   [place] [facility] Sports Medicine at [facility], Leita DEL, MD   4 months ago Acute vaginitis   [facility] Primary Care & Sports Medicine at [facility], Leita DEL, MD   5 months ago Essential (primary) hypertension   [place] [facility] Care & Sports Medicine at [facility], Leita DEL, MD       Future Appointments             In 2 months Justus, Leita DEL, MD [facility] Primary Care & Sports Medicine at [facility], [phone] [place]

## 2023-10-27 NOTE — Progress Notes (Unsigned)
 Date:  10/27/2023   Name:  Misty Villarreal   DOB:  14-May-1950   MRN:  [id number]   Chief Complaint: No chief complaint on file.  HPI  Review of Systems   Lab Results  Component Value Date   NA 137 08/13/2023   K 5.3 (A) 08/13/2023   CO2 23 (A) 08/13/2023   GLUCOSE 188 (H) 01/23/2023   BUN 35 (A) 08/13/2023   CREATININE 1.8 (A) 08/13/2023   CALCIUM  9.4 08/13/2023   EGFR 33 04/02/2023   GFRNONAA 34 (L) 05/30/2022   Lab Results  Component Value Date   CHOL 243 (H) 01/23/2023   HDL 50 01/23/2023   LDLCALC 141 (H) 01/23/2023   LDLDIRECT 76 07/14/2020   TRIG 285 (H) 01/23/2023   CHOLHDL 4.9 (H) 01/23/2023   Lab Results  Component Value Date   TSH 5.130 (H) 01/23/2023   Lab Results  Component Value Date   HGBA1C 7.7 (A) 09/30/2023   Lab Results  Component Value Date   WBC 7.8 04/02/2023   HGB 11.8 (A) 04/02/2023   HCT 36 04/02/2023   MCV 89.3 05/30/2022   PLT 255 04/02/2023   Lab Results  Component Value Date   ALT 18 01/23/2023   AST 18 01/23/2023   ALKPHOS 113 01/23/2023   BILITOT 0.7 01/23/2023   Lab Results  Component Value Date   VD25OH 33.2 01/23/2023     Patient Active Problem List   Diagnosis Date Noted   Atherosclerosis of artery of extremity with rest pain (HCC) 01/23/2023   Type II diabetes mellitus with renal manifestations (HCC) 03/14/2022   Chronic diastolic CHF (congestive heart failure) (HCC) 03/14/2022   Obesity (BMI 30-39.9) 03/14/2022   Enteritis 03/14/2022   Benign essential tremor 12/07/2021   Coronary artery disease involving native coronary artery of native heart with angina pectoris 05/14/2021   Atherosclerosis of native arteries of extremity with intermittent claudication 05/15/2020   Palpitations 03/29/2020   Stable angina pectoris 03/29/2020   Aortic atherosclerosis 11/19/2019   Stage 3b chronic kidney disease (HCC) 12/23/2018   Restless leg syndrome 12/23/2018   Unruptured synovial cyst of popliteal space 12/23/2018    Plantar fasciitis of left foot 12/23/2018   B12 deficiency 11/26/2018   Hypogammaglobulinemia 11/11/2018   Anemia due to stage 3b chronic kidney disease (HCC) 11/11/2018   Osteopenia determined by x-ray 02/16/2018   Major depressive disorder with single episode, in partial remission 08/04/2017   Tinnitus of right ear 08/05/2016   Type II diabetes mellitus with complication (HCC) 08/03/2015   Lymphedema 06/21/2015   History of paroxysmal supraventricular tachycardia 06/21/2015   PAD (peripheral artery disease) 02/02/2015   Hyperlipidemia associated with type 2 diabetes mellitus (HCC) 09/01/2014   Neuropathy 09/01/2014   Phlebectasia 09/01/2014   Essential (primary) hypertension 09/01/2014   Spondylolisthesis at L4-L5 level 07/05/2014   Arthritis, degenerative 01/31/2014    Allergies  Allergen Reactions   Augmentin  [Amoxicillin -Pot Clavulanate] Diarrhea   Levaquin  [Levofloxacin ] Nausea And Vomiting and Other (See Comments)    Ankle pain   Tetracycline     Other reaction(s): emesis   Cefaclor Rash   Cephalexin Rash   Rosuvastatin  Diarrhea   Sulfa Antibiotics Rash    Other reaction(s): emesis    Past Surgical History:  Procedure Laterality Date   ANGIOPLASTY / STENTING FEMORAL Left 2014, 2013   BRAIN MENINGIOMA EXCISION  1988   BREAST BIOPSY Right 1989   benign   CARPAL TUNNEL RELEASE Left 1980   CATARACT EXTRACTION  W/PHACO Right 09/02/2019   Procedure: CATARACT EXTRACTION PHACO AND INTRAOCULAR LENS PLACEMENT (IOC);  Surgeon: Myrna Adine Anes, MD;  Location: [facility] ORS;  Service: Ophthalmology;  Laterality: Right;  US  00:27.3 CDE 1.92 AP% Fluid Pack lot # [id number]   CATARACT EXTRACTION W/PHACO Left 10/18/2019   Procedure: CATARACT EXTRACTION PHACO AND INTRAOCULAR LENS PLACEMENT (IOC) LEFT DIABETIC 2.55  00:28.5;  Surgeon: Myrna Adine Anes, MD;  Location: [facility];  Service: Ophthalmology;  Laterality: Left;  Diabetic - oral meds   CESAREAN SECTION      CHOLECYSTECTOMY  1987   COLONOSCOPY WITH PROPOFOL  N/A 05/08/2017   Procedure: COLONOSCOPY WITH PROPOFOL ;  Surgeon: Therisa Bi, MD;  Location: [facility] ENDOSCOPY;  Service: Gastroenterology;  Laterality: N/A;   KNEE ARTHROPLASTY Right 2009   LOWER EXTREMITY ANGIOGRAPHY Left 07/21/2018   Procedure: LOWER EXTREMITY ANGIOGRAPHY;  Surgeon: Jama Cordella MATSU, MD;  Location: [facility] INVASIVE CV LAB;  Service: Cardiovascular;  Laterality: Left;    Social History   Tobacco Use   Smoking status: Former    Current packs/day: 0.00    Average packs/day: 0.5 packs/day for 46.0 years (23.0 ttl pk-yrs)    Types: Cigarettes    Start date: 04/06/1967    Quit date: 04/05/2013    Years since quitting: 10.5   Smokeless tobacco: Never  Vaping Use   Vaping status: Never Used  Substance Use Topics   Alcohol use: Not Currently    Alcohol/week: 2.0 standard drinks of alcohol    Types: 2 Standard drinks or equivalent per week   Drug use: Never     Medication list has been reviewed and updated.  No outpatient medications have been marked as taking for the 10/27/23 encounter (Orders Only) with Justus Leita DEL, MD.       09/30/2023    1:17 PM 06/27/2023   11:39 AM 05/30/2023    1:18 PM 01/23/2023   11:09 AM  GAD 7 : Generalized Anxiety Score  Nervous, Anxious, on Edge 0 0 0 0  Control/stop worrying 0 0 0 0  Worry too much - different things 0 0 0 0  Trouble relaxing 0 0 0 0  Restless 0 0 0 0  Easily annoyed or irritable 0 0 0 0  Afraid - awful might happen 0 0 0 0  Total GAD 7 Score 0 0 0 0  Anxiety Difficulty Not difficult at all Not difficult at all Not difficult at all Not difficult at all       09/30/2023    1:17 PM 06/27/2023   11:38 AM 05/30/2023    1:18 PM  Depression screen PHQ 2/9  Decreased Interest 0 0 0  Down, Depressed, Hopeless 0 0 0  PHQ - 2 Score 0 0 0  Altered sleeping 0 0 0  Tired, decreased energy 0 0 0  Change in appetite 0 2 0  Feeling bad or failure about yourself  0 0 0  Trouble  concentrating 0 0 0  Moving slowly or fidgety/restless 0 0 0  Suicidal thoughts 0 0 0  PHQ-9 Score 0 2 0  Difficult doing work/chores Not difficult at all Not difficult at all Not difficult at all    BP Readings from Last 3 Encounters:  09/30/23 122/84  06/27/23 128/74  05/30/23 110/78    Physical Exam  Wt Readings from Last 3 Encounters:  09/30/23 186 lb (84.4 kg)  06/27/23 187 lb 4 oz (84.9 kg)  05/30/23 187 lb (84.8 kg)    LMP  (  LMP Unknown)   Assessment and Plan:  Problem List Items Addressed This Visit   None   No follow-ups on file.    Leita HILARIO Adie, MD [facility] [place]

## 2023-10-27 NOTE — Telephone Encounter (Signed)
 Requested Prescriptions  Pending Prescriptions Disp Refills   lisinopril  (ZESTRIL ) 20 MG tablet [Pharmacy Med Name: Lisinopril  20 MG Oral Tablet] 90 tablet 0    Sig: TAKE 1 TABLET BY MOUTH DAILY     Cardiovascular:  ACE Inhibitors Failed - 10/27/2023 12:17 PM      Failed - Cr in normal range and within 180 days    Creatinine  Date Value Ref Range Status  08/13/2023 1.8 (A) 0.5 - 1.1 Final  04/27/2013 1.28 0.60 - 1.30 mg/dL Final   Creatinine, Ser  Date Value Ref Range Status  01/23/2023 1.53 (H) 0.57 - 1.00 mg/dL Final   Creatinine, Urine  Date Value Ref Range Status  08/13/2023 93  Final         Failed - K in normal range and within 180 days    Potassium  Date Value Ref Range Status  08/13/2023 5.3 (A) 3.5 - 5.1 mEq/L Final  04/27/2013 4.2 3.5 - 5.1 mmol/L Final         Passed - Patient is not pregnant      Passed - Last BP in normal range    BP Readings from Last 1 Encounters:  09/30/23 122/84         Passed - Valid encounter within last 6 months    Recent Outpatient Visits           3 weeks ago Type II diabetes mellitus with complication [facility])   [place] [facility] at [facility], Leita DEL, MD   4 months ago Acute vaginitis   [place] [facility] at [facility], Leita DEL, MD   5 months ago Essential (primary) hypertension   [place] [facility] at [facility], Leita DEL, MD       Future Appointments             In 2 months Justus Leita DEL, MD [facility] at [facility], [phone] [place]             meclizine  (ANTIVERT ) 12.5 MG tablet [Pharmacy Med Name: MECLIZINE   12.5MG   TAB] 180 tablet 0    Sig: TAKE 1 TABLET BY MOUTH TWICE  DAILY AS NEEDED FOR DIZZINESS     Not Delegated - Gastroenterology: Antiemetics Failed - 10/27/2023 12:17 PM      Failed - This refill cannot be delegated      Passed - Valid encounter  within last 6 months    Recent Outpatient Visits           3 weeks ago Type II diabetes mellitus with complication [facility])   [place] [facility] at [facility], Leita DEL, MD   4 months ago Acute vaginitis   [place] [facility] at [facility], Leita DEL, MD   5 months ago Essential (primary) hypertension   [place] [facility] at [facility], Leita DEL, MD       Future Appointments             In 2 months Justus Leita DEL, MD [facility] Medicine at [facility], [phone] [place]             sertraline  (ZOLOFT ) 50 MG tablet [Pharmacy Med Name: Sertraline  HCl 50 MG Oral Tablet] 90 tablet 0    Sig: TAKE 1 TABLET BY MOUTH  DAILY     Psychiatry:  Antidepressants - SSRI - sertraline  Passed - 10/27/2023 12:17 PM      Passed - AST in normal range and within 360 days    AST  Date Value Ref Range Status  01/23/2023 18 0 - 40 IU/L Final         Passed - ALT in normal range and within 360 days    ALT  Date Value Ref Range Status  01/23/2023 18 0 - 32 IU/L Final         Passed - Completed PHQ-2 or PHQ-9 in the last 360 days      Passed - Valid encounter within last 6 months    Recent Outpatient Visits           3 weeks ago Type II diabetes mellitus with complication [facility])   [place] [facility] Medicine at [facility], Leita DEL, MD   4 months ago Acute vaginitis   [facility] at [facility], Leita DEL, MD   5 months ago Essential (primary) hypertension   [place] [facility] Sports Medicine at [facility], Leita DEL, MD       Future Appointments             In 2 months Justus, Leita DEL, MD [facility] Primary [facility] Medicine at [facility], [phone] [place]

## 2023-10-27 NOTE — Telephone Encounter (Signed)
 Copied from CRM [id number]. Topic: General - Other >> Oct 27, 2023 11:48 AM Ahlexyia S wrote: Reason for CRM: Pt stated she was recently prescribed tirzepatide  (MOUNJARO ) 2.5 MG/0.5ML Pen. Pt was told to by PCP to contact clinic to see how pt does with med and if its okay to continue taking. Pt stated the med is doing well and she isnt having any side affects.

## 2023-10-28 NOTE — Telephone Encounter (Signed)
 Called and informed patient.

## 2023-11-25 ENCOUNTER — Encounter: Payer: Self-pay | Admitting: Internal Medicine

## 2023-11-27 ENCOUNTER — Other Ambulatory Visit: Payer: Self-pay | Admitting: Internal Medicine

## 2023-11-27 DIAGNOSIS — E118 Type 2 diabetes mellitus with unspecified complications: Secondary | ICD-10-CM

## 2023-11-27 MED ORDER — TIRZEPATIDE 7.5 MG/0.5ML ~~LOC~~ SOAJ: 7.5000 mg | SUBCUTANEOUS | 0 refills | Status: DC

## 2023-11-27 NOTE — Progress Notes (Unsigned)
 Date:  11/27/2023   Name:  Misty Villarreal   DOB:  [birth year]   MRN:  [id number]   Chief Complaint: No chief complaint on file.  HPI  Review of Systems   Lab Results  Component Value Date   NA 137 08/13/2023   K 5.3 (A) 08/13/2023   CO2 23 (A) 08/13/2023   GLUCOSE 188 (H) 01/23/2023   BUN 35 (A) 08/13/2023   CREATININE 1.8 (A) 08/13/2023   CALCIUM  9.4 08/13/2023   EGFR 33 04/02/2023   GFRNONAA 34 (L) 05/30/2022   Lab Results  Component Value Date   CHOL 243 (H) 01/23/2023   HDL 50 01/23/2023   LDLCALC 141 (H) 01/23/2023   LDLDIRECT 76 07/14/2020   TRIG 285 (H) 01/23/2023   CHOLHDL 4.9 (H) 01/23/2023   Lab Results  Component Value Date   TSH 5.130 (H) 01/23/2023   Lab Results  Component Value Date   HGBA1C 7.7 (A) 09/30/2023   Lab Results  Component Value Date   WBC 7.8 04/02/2023   HGB 11.8 (A) 04/02/2023   HCT 36 04/02/2023   MCV 89.3 05/30/2022   PLT 255 04/02/2023   Lab Results  Component Value Date   ALT 18 01/23/2023   AST 18 01/23/2023   ALKPHOS 113 01/23/2023   BILITOT 0.7 01/23/2023   Lab Results  Component Value Date   VD25OH 33.2 01/23/2023     Patient Active Problem List   Diagnosis Date Noted   Atherosclerosis of artery of extremity with rest pain (HCC) 01/23/2023   Type II diabetes mellitus with renal manifestations (HCC) 03/14/2022   Chronic diastolic CHF (congestive heart failure) (HCC) 03/14/2022   Obesity (BMI 30-39.9) 03/14/2022   Enteritis 03/14/2022   Benign essential tremor 12/07/2021   Coronary artery disease involving native coronary artery of native heart with angina pectoris 05/14/2021   Atherosclerosis of native arteries of extremity with intermittent claudication 05/15/2020   Palpitations 03/29/2020   Stable angina pectoris 03/29/2020   Aortic atherosclerosis 11/19/2019   Stage 3b chronic kidney disease (HCC) 12/23/2018   Restless leg syndrome 12/23/2018   Unruptured synovial cyst of popliteal space 12/23/2018    Plantar fasciitis of left foot 12/23/2018   B12 deficiency 11/26/2018   Hypogammaglobulinemia 11/11/2018   Anemia due to stage 3b chronic kidney disease (HCC) 11/11/2018   Osteopenia determined by x-ray 02/16/2018   Major depressive disorder with single episode, in partial remission 08/04/2017   Tinnitus of right ear 08/05/2016   Type II diabetes mellitus with complication (HCC) 08/03/2015   Lymphedema 06/21/2015   History of paroxysmal supraventricular tachycardia 06/21/2015   PAD (peripheral artery disease) 02/02/2015   Hyperlipidemia associated with type 2 diabetes mellitus (HCC) 09/01/2014   Neuropathy 09/01/2014   Phlebectasia 09/01/2014   Essential (primary) hypertension 09/01/2014   Spondylolisthesis at L4-L5 level 07/05/2014   Arthritis, degenerative 01/31/2014    Allergies  Allergen Reactions   Augmentin  [Amoxicillin -Pot Clavulanate] Diarrhea   Levaquin  [Levofloxacin ] Nausea And Vomiting and Other (See Comments)    Ankle pain   Tetracycline     Other reaction(s): emesis   Cefaclor Rash   Cephalexin Rash   Rosuvastatin  Diarrhea   Sulfa Antibiotics Rash    Other reaction(s): emesis    Past Surgical History:  Procedure Laterality Date   ANGIOPLASTY / STENTING FEMORAL Left 2014, 2013   BRAIN MENINGIOMA EXCISION  1988   BREAST BIOPSY Right 1989   benign   CARPAL TUNNEL RELEASE Left 1980   CATARACT EXTRACTION  W/PHACO Right 09/02/2019   Procedure: CATARACT EXTRACTION PHACO AND INTRAOCULAR LENS PLACEMENT (IOC);  Surgeon: Myrna Adine Anes, MD;  Location: [facility] ORS;  Service: Ophthalmology;  Laterality: Right;  US  00:27.3 CDE 1.92 AP% Fluid Pack lot # [id number]   CATARACT EXTRACTION W/PHACO Left 10/18/2019   Procedure: CATARACT EXTRACTION PHACO AND INTRAOCULAR LENS PLACEMENT (IOC) LEFT DIABETIC 2.55  00:28.5;  Surgeon: Myrna Adine Anes, MD;  Location: [facility];  Service: Ophthalmology;  Laterality: Left;  Diabetic - oral meds   CESAREAN SECTION      CHOLECYSTECTOMY  1987   COLONOSCOPY WITH PROPOFOL  N/A 05/08/2017   Procedure: COLONOSCOPY WITH PROPOFOL ;  Surgeon: Therisa Bi, MD;  Location: [facility] ENDOSCOPY;  Service: Gastroenterology;  Laterality: N/A;   KNEE ARTHROPLASTY Right 2009   LOWER EXTREMITY ANGIOGRAPHY Left 07/21/2018   Procedure: LOWER EXTREMITY ANGIOGRAPHY;  Surgeon: Jama Cordella MATSU, MD;  Location: [facility] INVASIVE CV LAB;  Service: Cardiovascular;  Laterality: Left;    Social History   Tobacco Use   Smoking status: Former    Current packs/day: 0.00    Average packs/day: 0.5 packs/day for 46.0 years (23.0 ttl pk-yrs)    Types: Cigarettes    Start date: 04/06/1967    Quit date: 04/05/2013    Years since quitting: 10.6   Smokeless tobacco: Never  Vaping Use   Vaping status: Never Used  Substance Use Topics   Alcohol use: Not Currently    Alcohol/week: 2.0 standard drinks of alcohol    Types: 2 Standard drinks or equivalent per week   Drug use: Never     Medication list has been reviewed and updated.  No outpatient medications have been marked as taking for the 11/27/23 encounter (Orders Only) with Justus Leita DEL, MD.       09/30/2023    1:17 PM 06/27/2023   11:39 AM 05/30/2023    1:18 PM 01/23/2023   11:09 AM  GAD 7 : Generalized Anxiety Score  Nervous, Anxious, on Edge 0 0 0 0  Control/stop worrying 0 0 0 0  Worry too much - different things 0 0 0 0  Trouble relaxing 0 0 0 0  Restless 0 0 0 0  Easily annoyed or irritable 0 0 0 0  Afraid - awful might happen 0 0 0 0  Total GAD 7 Score 0 0 0 0  Anxiety Difficulty Not difficult at all Not difficult at all Not difficult at all Not difficult at all       09/30/2023    1:17 PM 06/27/2023   11:38 AM 05/30/2023    1:18 PM  Depression screen PHQ 2/9  Decreased Interest 0 0 0  Down, Depressed, Hopeless 0 0 0  PHQ - 2 Score 0 0 0  Altered sleeping 0 0 0  Tired, decreased energy 0 0 0  Change in appetite 0 2 0  Feeling bad or failure about yourself  0 0 0  Trouble  concentrating 0 0 0  Moving slowly or fidgety/restless 0 0 0  Suicidal thoughts 0 0 0  PHQ-9 Score 0  2  0   Difficult doing work/chores Not difficult at all Not difficult at all Not difficult at all     Data saved with a previous flowsheet row definition    BP Readings from Last 3 Encounters:  09/30/23 122/84  06/27/23 128/74  05/30/23 110/78    Physical Exam  Wt Readings from Last 3 Encounters:  09/30/23 186 lb (84.4 kg)  06/27/23 187 lb  4 oz (84.9 kg)  05/30/23 187 lb (84.8 kg)    LMP  (LMP Unknown)   Assessment and Plan:  Problem List Items Addressed This Visit   None   No follow-ups on file.    Leita HILARIO Adie, MD [facility] [place]

## 2023-11-27 NOTE — Telephone Encounter (Signed)
 Pt wants to increase dosage.  KP

## 2023-12-11 ENCOUNTER — Ambulatory Visit
Admission: RE | Admit: 2023-12-11 | Discharge: 2023-12-11 | Disposition: A | Discharge status: Home / Self Care | Source: Ambulatory Visit | Attending: Internal Medicine | Admitting: Internal Medicine

## 2023-12-11 DIAGNOSIS — Z1231 Encounter for screening mammogram for malignant neoplasm of breast: Secondary | ICD-10-CM | POA: Insufficient documentation

## 2023-12-12 LAB — OPHTHALMOLOGY REPORT-SCANNED

## 2023-12-22 ENCOUNTER — Other Ambulatory Visit: Payer: Self-pay | Admitting: Internal Medicine

## 2023-12-22 DIAGNOSIS — E118 Type 2 diabetes mellitus with unspecified complications: Secondary | ICD-10-CM

## 2023-12-22 MED ORDER — TIRZEPATIDE 10 MG/0.5ML ~~LOC~~ SOAJ: 10.0000 mg | SUBCUTANEOUS | 0 refills | Status: DC

## 2023-12-22 NOTE — Progress Notes (Unsigned)
 Date:  12/22/2023   Name:  Misty Villarreal   DOB:  [birth year]   MRN:  [id number]   Chief Complaint: No chief complaint on file.  HPI  Review of Systems   Lab Results  Component Value Date   NA 137 08/13/2023   K 5.3 (A) 08/13/2023   CO2 23 (A) 08/13/2023   GLUCOSE 188 (H) 01/23/2023   BUN 35 (A) 08/13/2023   CREATININE 1.8 (A) 08/13/2023   CALCIUM  9.4 08/13/2023   EGFR 33 04/02/2023   GFRNONAA 34 (L) 05/30/2022   Lab Results  Component Value Date   CHOL 243 (H) 01/23/2023   HDL 50 01/23/2023   LDLCALC 141 (H) 01/23/2023   LDLDIRECT 76 07/14/2020   TRIG 285 (H) 01/23/2023   CHOLHDL 4.9 (H) 01/23/2023   Lab Results  Component Value Date   TSH 5.130 (H) 01/23/2023   Lab Results  Component Value Date   HGBA1C 7.7 (A) 09/30/2023   Lab Results  Component Value Date   WBC 7.8 04/02/2023   HGB 11.8 (A) 04/02/2023   HCT 36 04/02/2023   MCV 89.3 05/30/2022   PLT 255 04/02/2023   Lab Results  Component Value Date   ALT 18 01/23/2023   AST 18 01/23/2023   ALKPHOS 113 01/23/2023   BILITOT 0.7 01/23/2023   Lab Results  Component Value Date   VD25OH 33.2 01/23/2023     Patient Active Problem List   Diagnosis Date Noted   Atherosclerosis of artery of extremity with rest pain (HCC) 01/23/2023   Type II diabetes mellitus with renal manifestations (HCC) 03/14/2022   Chronic diastolic CHF (congestive heart failure) (HCC) 03/14/2022   Obesity (BMI 30-39.9) 03/14/2022   Enteritis 03/14/2022   Benign essential tremor 12/07/2021   Coronary artery disease involving native coronary artery of native heart with angina pectoris 05/14/2021   Atherosclerosis of native arteries of extremity with intermittent claudication 05/15/2020   Palpitations 03/29/2020   Stable angina pectoris 03/29/2020   Aortic atherosclerosis 11/19/2019   Stage 3b chronic kidney disease (HCC) 12/23/2018   Restless leg syndrome 12/23/2018   Unruptured synovial cyst of popliteal space 12/23/2018    Plantar fasciitis of left foot 12/23/2018   B12 deficiency 11/26/2018   Hypogammaglobulinemia 11/11/2018   Anemia due to stage 3b chronic kidney disease (HCC) 11/11/2018   Osteopenia determined by x-ray 02/16/2018   Major depressive disorder with single episode, in partial remission 08/04/2017   Tinnitus of right ear 08/05/2016   Type II diabetes mellitus with complication (HCC) 08/03/2015   Lymphedema 06/21/2015   History of paroxysmal supraventricular tachycardia 06/21/2015   PAD (peripheral artery disease) 02/02/2015   Hyperlipidemia associated with type 2 diabetes mellitus (HCC) 09/01/2014   Neuropathy 09/01/2014   Phlebectasia 09/01/2014   Essential (primary) hypertension 09/01/2014   Spondylolisthesis at L4-L5 level 07/05/2014   Arthritis, degenerative 01/31/2014    Allergies  Allergen Reactions   Augmentin  [Amoxicillin -Pot Clavulanate] Diarrhea   Levaquin  [Levofloxacin ] Nausea And Vomiting and Other (See Comments)    Ankle pain   Tetracycline     Other reaction(s): emesis   Cefaclor Rash   Cephalexin Rash   Rosuvastatin  Diarrhea   Sulfa Antibiotics Rash    Other reaction(s): emesis    Past Surgical History:  Procedure Laterality Date   ANGIOPLASTY / STENTING FEMORAL Left 2014, 2013   BRAIN MENINGIOMA EXCISION  1988   BREAST BIOPSY Right 1989   benign   CARPAL TUNNEL RELEASE Left 1980   CATARACT EXTRACTION  W/PHACO Right 09/02/2019   Procedure: CATARACT EXTRACTION PHACO AND INTRAOCULAR LENS PLACEMENT (IOC);  Surgeon: Myrna Adine Anes, MD;  Location: [facility] ORS;  Service: Ophthalmology;  Laterality: Right;  US  00:27.3 CDE 1.92 AP% Fluid Pack lot # [id number]   CATARACT EXTRACTION W/PHACO Left 10/18/2019   Procedure: CATARACT EXTRACTION PHACO AND INTRAOCULAR LENS PLACEMENT (IOC) LEFT DIABETIC 2.55  00:28.5;  Surgeon: Myrna Adine Anes, MD;  Location: [facility];  Service: Ophthalmology;  Laterality: Left;  Diabetic - oral meds   CESAREAN SECTION      CHOLECYSTECTOMY  1987   COLONOSCOPY WITH PROPOFOL  N/A 05/08/2017   Procedure: COLONOSCOPY WITH PROPOFOL ;  Surgeon: Therisa Bi, MD;  Location: [facility] ENDOSCOPY;  Service: Gastroenterology;  Laterality: N/A;   KNEE ARTHROPLASTY Right 2009   LOWER EXTREMITY ANGIOGRAPHY Left 07/21/2018   Procedure: LOWER EXTREMITY ANGIOGRAPHY;  Surgeon: Jama Cordella MATSU, MD;  Location: [facility] INVASIVE CV LAB;  Service: Cardiovascular;  Laterality: Left;    Social History   Tobacco Use   Smoking status: Former    Current packs/day: 0.00    Average packs/day: 0.5 packs/day for 46.0 years (23.0 ttl pk-yrs)    Types: Cigarettes    Start date: 04/06/1967    Quit date: 04/05/2013    Years since quitting: 10.7   Smokeless tobacco: Never  Vaping Use   Vaping status: Never Used  Substance Use Topics   Alcohol use: Not Currently    Alcohol/week: 2.0 standard drinks of alcohol    Types: 2 Standard drinks or equivalent per week   Drug use: Never     Medication list has been reviewed and updated.  No outpatient medications have been marked as taking for the 12/22/23 encounter (Orders Only) with Justus Leita DEL, MD.       09/30/2023    1:17 PM 06/27/2023   11:39 AM 05/30/2023    1:18 PM 01/23/2023   11:09 AM  GAD 7 : Generalized Anxiety Score  Nervous, Anxious, on Edge 0 0 0 0  Control/stop worrying 0 0 0 0  Worry too much - different things 0 0 0 0  Trouble relaxing 0 0 0 0  Restless 0 0 0 0  Easily annoyed or irritable 0 0 0 0  Afraid - awful might happen 0 0 0 0  Total GAD 7 Score 0 0 0 0  Anxiety Difficulty Not difficult at all Not difficult at all Not difficult at all Not difficult at all       09/30/2023    1:17 PM 06/27/2023   11:38 AM 05/30/2023    1:18 PM  Depression screen PHQ 2/9  Decreased Interest 0 0 0  Down, Depressed, Hopeless 0 0 0  PHQ - 2 Score 0 0 0  Altered sleeping 0 0 0  Tired, decreased energy 0 0 0  Change in appetite 0 2 0  Feeling bad or failure about yourself  0 0 0  Trouble  concentrating 0 0 0  Moving slowly or fidgety/restless 0 0 0  Suicidal thoughts 0 0 0  PHQ-9 Score 0  2  0   Difficult doing work/chores Not difficult at all Not difficult at all Not difficult at all     Data saved with a previous flowsheet row definition    BP Readings from Last 3 Encounters:  09/30/23 122/84  06/27/23 128/74  05/30/23 110/78    Physical Exam  Wt Readings from Last 3 Encounters:  09/30/23 186 lb (84.4 kg)  06/27/23 187 lb  4 oz (84.9 kg)  05/30/23 187 lb (84.8 kg)    LMP  (LMP Unknown)   Assessment and Plan:  Problem List Items Addressed This Visit   None   No follow-ups on file.    Leita HILARIO Adie, MD [facility] [place]

## 2023-12-22 NOTE — Telephone Encounter (Signed)
 Please review.  KP

## 2023-12-23 LAB — PROTEIN / CREATININE RATIO, URINE: Creatinine, Urine: 276

## 2023-12-23 LAB — CBC AND DIFFERENTIAL
HCT: 39 (ref 36–46)
Hemoglobin: 12.9 (ref 12.0–16.0)
Platelets: 317 K/uL (ref 150–400)
WBC: 7.5

## 2023-12-23 LAB — BASIC METABOLIC PANEL WITH GFR
BUN: 20 (ref 4–21)
CO2: 24 — AB (ref 13–22)
Chloride: 103 (ref 99–108)
Creatinine: 1.5 — AB (ref 0.5–1.1)
Glucose: 121
Potassium: 4.4 meq/L (ref 3.5–5.1)
Sodium: 138 (ref 137–147)

## 2023-12-23 LAB — COMPREHENSIVE METABOLIC PANEL WITH GFR
Calcium: 9.1 (ref 8.7–10.7)
eGFR: 38

## 2023-12-23 LAB — MICROALBUMIN, URINE: Microalb, Ur: 6.2

## 2023-12-23 LAB — MICROALBUMIN / CREATININE URINE RATIO: Microalb Creat Ratio: 23

## 2023-12-30 ENCOUNTER — Encounter: Payer: Self-pay | Admitting: Internal Medicine

## 2023-12-30 ENCOUNTER — Ambulatory Visit (INDEPENDENT_AMBULATORY_CARE_PROVIDER_SITE_OTHER): Admitting: Internal Medicine

## 2023-12-30 VITALS — BP 122/74 | HR 86 | Ht 62.0 in | Wt 177.0 lb

## 2023-12-30 DIAGNOSIS — N1832 Chronic kidney disease, stage 3b: Secondary | ICD-10-CM

## 2023-12-30 DIAGNOSIS — E785 Hyperlipidemia, unspecified: Secondary | ICD-10-CM

## 2023-12-30 DIAGNOSIS — E118 Type 2 diabetes mellitus with unspecified complications: Secondary | ICD-10-CM

## 2023-12-30 DIAGNOSIS — I10 Essential (primary) hypertension: Secondary | ICD-10-CM

## 2023-12-30 DIAGNOSIS — Z7985 Long-term (current) use of injectable non-insulin antidiabetic drugs: Secondary | ICD-10-CM

## 2023-12-30 DIAGNOSIS — F324 Major depressive disorder, single episode, in partial remission: Secondary | ICD-10-CM

## 2023-12-30 DIAGNOSIS — J432 Centrilobular emphysema: Secondary | ICD-10-CM | POA: Insufficient documentation

## 2023-12-30 DIAGNOSIS — E1169 Type 2 diabetes mellitus with other specified complication: Secondary | ICD-10-CM

## 2023-12-30 NOTE — Assessment & Plan Note (Signed)
 LDL is  Lab Results  Component Value Date   LDLCALC 141 (H) 01/23/2023    Current medication regimen is atorvastatin  40 mg. Goal LDL is < 55.

## 2023-12-30 NOTE — Assessment & Plan Note (Signed)
 Depression and anxiety symptoms are stable and well controlled on Sertraline  50 mg daily. No SI/HI reported. I recommend continuing the same medical regimen.

## 2023-12-30 NOTE — Assessment & Plan Note (Addendum)
 Currently medications are Mounjaro .  No hypoglycemic episodes noted. Home blood sugars in the 130 range. Last visit medical regimen changes were to stop Farxiga  and begin Mounjaro . Lab Results  Component Value Date   HGBA1C 7.7 (A) 09/30/2023   Mounjaro  has been an excellent choice - Nephrology is happy that she if off of MTF. She is feeling well, has more energy and no GI side effects. Will check A1C and likely continue the same dose of 10 mg.

## 2023-12-30 NOTE — Assessment & Plan Note (Addendum)
 Seen on LDCT lung cancer screening CT scan. She denies chronic cough, wheezing or shortness of breath.

## 2023-12-30 NOTE — Assessment & Plan Note (Addendum)
 Followed closely by Dr. Marcelino Recent labs done - GFR is stable;  PTH improved.

## 2023-12-30 NOTE — Assessment & Plan Note (Signed)
 BP well controlled.

## 2023-12-30 NOTE — Progress Notes (Signed)
 Date:  12/30/2023   Name:  Misty Villarreal   DOB:  [birth year]   MRN:  [id number]   Chief Complaint: Diabetes  Diabetes She presents for her follow-up diabetic visit. She has type 2 diabetes mellitus. Her disease course has been stable. Pertinent negatives for hypoglycemia include no dizziness or headaches. Pertinent negatives for diabetes include no chest pain, no fatigue and no weakness. Pertinent negatives for diabetic complications include no CVA. Current diabetic treatments: Mounjaro . She is compliant with treatment all of the time.  Hypertension This is a chronic problem. The problem is controlled. Pertinent negatives include no chest pain, headaches, palpitations or shortness of breath. Past treatments include ACE inhibitors and beta blockers. The current treatment provides significant improvement. Hypertensive end-organ damage includes kidney disease and CAD/MI. There is no history of CVA.  Depression        This is a chronic problem.The problem is unchanged.  Associated symptoms include no fatigue, no myalgias and no headaches.  Past treatments include SSRIs - Selective serotonin reuptake inhibitors.  Compliance with treatment is good.  Previous treatment provided significant relief.   Review of Systems  Constitutional:  Negative for fatigue and unexpected weight change.  HENT:  Negative for trouble swallowing.   Eyes:  Negative for visual disturbance.  Respiratory:  Negative for cough, chest tightness, shortness of breath and wheezing.   Cardiovascular:  Negative for chest pain, palpitations and leg swelling.  Gastrointestinal:  Negative for abdominal pain, constipation and diarrhea.  Musculoskeletal:  Negative for arthralgias and myalgias.  Neurological:  Negative for dizziness, weakness, light-headedness and headaches.  Psychiatric/Behavioral:  Positive for depression.      Lab Results  Component Value Date   NA 138 12/23/2023   K 4.4 12/23/2023   CO2 24 (A) 12/23/2023    GLUCOSE 188 (H) 01/23/2023   BUN 20 12/23/2023   CREATININE 1.5 (A) 12/23/2023   CALCIUM  9.1 12/23/2023   EGFR 38 12/23/2023   GFRNONAA 34 (L) 05/30/2022   Lab Results  Component Value Date   CHOL 243 (H) 01/23/2023   HDL 50 01/23/2023   LDLCALC 141 (H) 01/23/2023   LDLDIRECT 76 07/14/2020   TRIG 285 (H) 01/23/2023   CHOLHDL 4.9 (H) 01/23/2023   Lab Results  Component Value Date   TSH 5.130 (H) 01/23/2023   Lab Results  Component Value Date   HGBA1C 7.7 (A) 09/30/2023   Lab Results  Component Value Date   WBC 7.5 12/23/2023   HGB 12.9 12/23/2023   HCT 39 12/23/2023   MCV 89.3 05/30/2022   PLT 317 12/23/2023   Lab Results  Component Value Date   ALT 18 01/23/2023   AST 18 01/23/2023   ALKPHOS 113 01/23/2023   BILITOT 0.7 01/23/2023   Lab Results  Component Value Date   VD25OH 33.2 01/23/2023     Patient Active Problem List   Diagnosis Date Noted   Centrilobular emphysema (HCC) 12/30/2023   Atherosclerosis of artery of extremity with rest pain (HCC) 01/23/2023   Type II diabetes mellitus with renal manifestations (HCC) 03/14/2022   Chronic diastolic CHF (congestive heart failure) (HCC) 03/14/2022   Obesity (BMI 30-39.9) 03/14/2022   Benign essential tremor 12/07/2021   Coronary artery disease involving native coronary artery of native heart with angina pectoris 05/14/2021   Atherosclerosis of native arteries of extremity with intermittent claudication 05/15/2020   Palpitations 03/29/2020   Stable angina pectoris 03/29/2020   Aortic atherosclerosis 11/19/2019   Stage 3b chronic kidney disease (  HCC) 12/23/2018   Restless leg syndrome 12/23/2018   Unruptured synovial cyst of popliteal space 12/23/2018   Plantar fasciitis of left foot 12/23/2018   B12 deficiency 11/26/2018   Hypogammaglobulinemia 11/11/2018   Anemia due to stage 3b chronic kidney disease (HCC) 11/11/2018   Osteopenia determined by x-ray 02/16/2018   Major depressive disorder with single  episode, in partial remission 08/04/2017   Tinnitus of right ear 08/05/2016   Type II diabetes mellitus with complication (HCC) 08/03/2015   Lymphedema 06/21/2015   History of paroxysmal supraventricular tachycardia 06/21/2015   PAD (peripheral artery disease) 02/02/2015   Hyperlipidemia associated with type 2 diabetes mellitus (HCC) 09/01/2014   Neuropathy 09/01/2014   Phlebectasia 09/01/2014   Essential (primary) hypertension 09/01/2014   Spondylolisthesis at L4-L5 level 07/05/2014   Arthritis, degenerative 01/31/2014    Allergies  Allergen Reactions   Augmentin  [Amoxicillin -Pot Clavulanate] Diarrhea   Levaquin  [Levofloxacin ] Nausea And Vomiting and Other (See Comments)    Ankle pain   Tetracycline     Other reaction(s): emesis   Cefaclor Rash   Cephalexin Rash   Rosuvastatin  Diarrhea   Sulfa Antibiotics Rash    Other reaction(s): emesis    Past Surgical History:  Procedure Laterality Date   ANGIOPLASTY / STENTING FEMORAL Left 2014, 2013   BRAIN MENINGIOMA EXCISION  1988   BREAST BIOPSY Right 1989   benign   CARPAL TUNNEL RELEASE Left 1980   CATARACT EXTRACTION W/PHACO Right 09/02/2019   Procedure: CATARACT EXTRACTION PHACO AND INTRAOCULAR LENS PLACEMENT (IOC);  Surgeon: Myrna Adine Anes, MD;  Location: [facility] ORS;  Service: Ophthalmology;  Laterality: Right;  US  00:27.3 CDE 1.92 AP% Fluid Pack lot # [id number]   CATARACT EXTRACTION W/PHACO Left 10/18/2019   Procedure: CATARACT EXTRACTION PHACO AND INTRAOCULAR LENS PLACEMENT (IOC) LEFT DIABETIC 2.55  00:28.5;  Surgeon: Myrna Adine Anes, MD;  Location: [facility];  Service: Ophthalmology;  Laterality: Left;  Diabetic - oral meds   CESAREAN SECTION     CHOLECYSTECTOMY  1987   COLONOSCOPY WITH PROPOFOL  N/A 05/08/2017   Procedure: COLONOSCOPY WITH PROPOFOL ;  Surgeon: Therisa Bi, MD;  Location: [facility] ENDOSCOPY;  Service: Gastroenterology;  Laterality: N/A;   KNEE ARTHROPLASTY Right 2009   LOWER EXTREMITY ANGIOGRAPHY  Left 07/21/2018   Procedure: LOWER EXTREMITY ANGIOGRAPHY;  Surgeon: Jama Cordella MATSU, MD;  Location: [facility] INVASIVE CV LAB;  Service: Cardiovascular;  Laterality: Left;    Social History   Tobacco Use   Smoking status: Former    Current packs/day: 0.00    Average packs/day: 0.5 packs/day for 46.0 years (23.0 ttl pk-yrs)    Types: Cigarettes    Start date: 04/06/1967    Quit date: 04/05/2013    Years since quitting: 10.7   Smokeless tobacco: Never  Vaping Use   Vaping status: Never Used  Substance Use Topics   Alcohol use: Not Currently    Alcohol/week: 2.0 standard drinks of alcohol    Types: 2 Standard drinks or equivalent per week   Drug use: Never     Medication list has been reviewed and updated.  Current Meds  Medication Sig   albuterol  (VENTOLIN  HFA) 108 (90 Base) MCG/ACT inhaler Inhale 2 puffs into the lungs every 6 (six) hours as needed.   aspirin  81 MG chewable tablet Chew 81 mg by mouth daily.    atorvastatin  (LIPITOR) 40 MG tablet Take 1 tablet (40 mg total) by mouth daily.   blood glucose meter kit and supplies KIT Dispense based on patient and insurance  preference. Use to check BS bid   Cholecalciferol  (VITAMIN D ) 50 MCG (2000 UT) CAPS Take 2 capsules by mouth daily.   cholestyramine  (QUESTRAN ) 4 GM/DOSE powder Take 1 packet (4 g total) by mouth 2 (two) times daily with a meal.   clopidogrel  (PLAVIX ) 75 MG tablet TAKE 1 TABLET BY MOUTH DAILY   Cyanocobalamin  (VITAMIN B 12 PO) Take 1,000 mcg by mouth daily. 1000mg  daily   fexofenadine (ALLEGRA) 180 MG tablet Take 180 mg by mouth daily.   lansoprazole (PREVACID) 30 MG capsule Take 30 mg by mouth daily at 12 noon.   lisinopril  (ZESTRIL ) 20 MG tablet TAKE 1 TABLET BY MOUTH DAILY   loperamide (IMODIUM A-D) 2 MG tablet Take 2 mg by mouth as needed for diarrhea or loose stools.   meclizine  (ANTIVERT ) 12.5 MG tablet TAKE 1 TABLET BY MOUTH TWICE  DAILY AS NEEDED FOR DIZZINESS   metoprolol  tartrate (LOPRESSOR ) 50 MG tablet  Take 50 mg by mouth 2 (two) times daily.   Multiple Vitamin tablet Take 1 tablet by mouth daily.    sertraline  (ZOLOFT ) 50 MG tablet TAKE 1 TABLET BY MOUTH DAILY   tirzepatide  (MOUNJARO ) 10 MG/0.5ML Pen Inject 10 mg into the skin once a week.       12/30/2023    2:12 PM 09/30/2023    1:17 PM 06/27/2023   11:39 AM 05/30/2023    1:18 PM  GAD 7 : Generalized Anxiety Score  Nervous, Anxious, on Edge 0 0 0 0  Control/stop worrying 0 0 0 0  Worry too much - different things 0 0 0 0  Trouble relaxing 0 0 0 0  Restless 0 0 0 0  Easily annoyed or irritable 0 0 0 0  Afraid - awful might happen 0 0 0 0  Total GAD 7 Score 0 0 0 0  Anxiety Difficulty Not difficult at all Not difficult at all Not difficult at all Not difficult at all       12/30/2023    2:11 PM 09/30/2023    1:17 PM 06/27/2023   11:38 AM  Depression screen PHQ 2/9  Decreased Interest 0 0 0  Down, Depressed, Hopeless 0 0 0  PHQ - 2 Score 0 0 0  Altered sleeping 0 0 0  Tired, decreased energy 0 0 0  Change in appetite 0 0 2  Feeling bad or failure about yourself  0 0 0  Trouble concentrating 0 0 0  Moving slowly or fidgety/restless 0 0 0  Suicidal thoughts 0 0 0  PHQ-9 Score 0 0  2   Difficult doing work/chores Not difficult at all Not difficult at all Not difficult at all     Data saved with a previous flowsheet row definition    BP Readings from Last 3 Encounters:  12/30/23 122/74  09/30/23 122/84  06/27/23 128/74    Physical Exam Vitals and nursing note reviewed.  Constitutional:      General: She is not in acute distress.    Appearance: Normal appearance. She is well-developed.  HENT:     Head: Normocephalic and atraumatic.  Neck:     Vascular: No carotid bruit.  Cardiovascular:     Rate and Rhythm: Normal rate and regular rhythm.     Heart sounds: No murmur heard. Pulmonary:     Effort: Pulmonary effort is normal. No respiratory distress.     Breath sounds: No wheezing or rhonchi.  Musculoskeletal:      Cervical back: Normal range of  motion.     Right lower leg: No edema.     Left lower leg: No edema.  Lymphadenopathy:     Cervical: No cervical adenopathy.  Skin:    General: Skin is warm and dry.     Findings: No rash.  Neurological:     General: No focal deficit present.     Mental Status: She is alert and oriented to person, place, and time.  Psychiatric:        Mood and Affect: Mood normal.        Behavior: Behavior normal.     Wt Readings from Last 3 Encounters:  12/30/23 177 lb (80.3 kg)  09/30/23 186 lb (84.4 kg)  06/27/23 187 lb 4 oz (84.9 kg)    BP 122/74   Pulse 86   Ht 5' 2 (1.575 m)   Wt 177 lb (80.3 kg)   LMP  (LMP Unknown)   SpO2 94%   BMI 32.37 kg/m   Assessment and Plan:  Problem List Items Addressed This Visit       Unprioritized   Hyperlipidemia associated with type 2 diabetes mellitus (HCC) (Chronic)   LDL is  Lab Results  Component Value Date   LDLCALC 141 (H) 01/23/2023    Current medication regimen is atorvastatin  40 mg. Goal LDL is < 55.       Relevant Orders   Lipid panel   Hepatic function panel   Essential (primary) hypertension - Primary (Chronic)   BP well controlled.      Type II diabetes mellitus with complication (HCC) (Chronic)   Currently medications are Mounjaro .  No hypoglycemic episodes noted. Home blood sugars in the 130 range. Last visit medical regimen changes were to stop Farxiga  and begin Mounjaro . Lab Results  Component Value Date   HGBA1C 7.7 (A) 09/30/2023   Mounjaro  has been an excellent choice - Nephrology is happy that she if off of MTF. She is feeling well, has more energy and no GI side effects. Will check A1C and likely continue the same dose of 10 mg.       Relevant Orders   Hemoglobin A1c   Major depressive disorder with single episode, in partial remission (Chronic)   Depression and anxiety symptoms are stable and well controlled on Sertraline  50 mg daily. No SI/HI reported. I recommend  continuing the same medical regimen.       Stage 3b chronic kidney disease (HCC) (Chronic)   Followed closely by Dr. Marcelino Recent labs done - GFR is stable;  PTH improved.      Centrilobular emphysema (HCC)   Seen on LDCT lung cancer screening CT scan. She denies chronic cough, wheezing or shortness of breath.      Other Visit Diagnoses       Long-term current use of injectable noninsulin antidiabetic medication           Return in about 3 months (around 03/29/2024) for CPX.    Leita HILARIO Adie, MD [facility] [place]

## 2024-01-02 ENCOUNTER — Other Ambulatory Visit: Payer: Self-pay | Admitting: Internal Medicine

## 2024-01-02 ENCOUNTER — Other Ambulatory Visit
Admission: RE | Admit: 2024-01-02 | Discharge: 2024-01-02 | Disposition: A | Attending: Internal Medicine | Admitting: Internal Medicine

## 2024-01-02 DIAGNOSIS — F32A Depression, unspecified: Secondary | ICD-10-CM

## 2024-01-02 DIAGNOSIS — I10 Essential (primary) hypertension: Secondary | ICD-10-CM

## 2024-01-05 ENCOUNTER — Ambulatory Visit: Payer: Self-pay | Admitting: Internal Medicine

## 2024-01-05 LAB — HEPATIC FUNCTION PANEL
ALT: 25 U/L (ref 0–44)
AST: 42 U/L — ABNORMAL HIGH (ref 15–41)
Albumin: 4.5 g/dL (ref 3.5–5.0)
Alkaline Phosphatase: 92 U/L (ref 38–126)
Bilirubin, Direct: 0.3 mg/dL — ABNORMAL HIGH (ref 0.0–0.2)
Indirect Bilirubin: 0.7 mg/dL (ref 0.3–0.9)
Total Bilirubin: 0.9 mg/dL (ref 0.0–1.2)
Total Protein: 7.2 g/dL (ref 6.5–8.1)

## 2024-01-05 LAB — HEMOGLOBIN A1C
Hgb A1c MFr Bld: 6.2 % — ABNORMAL HIGH (ref 4.8–5.6)
Mean Plasma Glucose: 131.24 mg/dL

## 2024-01-05 LAB — LIPID PANEL
Cholesterol: 184 mg/dL (ref 0–200)
HDL: 43 mg/dL (ref >40)
LDL Cholesterol: 106 mg/dL — ABNORMAL HIGH (ref 0–99)
Total CHOL/HDL Ratio: 4.3 ratio
Triglycerides: 179 mg/dL — ABNORMAL HIGH (ref <150)
VLDL: 36 mg/dL (ref 0–40)

## 2024-01-05 NOTE — Telephone Encounter (Signed)
 Requested Prescriptions  Pending Prescriptions Disp Refills   sertraline  (ZOLOFT ) 50 MG tablet [Pharmacy Med Name: Sertraline  HCl 50 MG Oral Tablet] 90 tablet 0    Sig: TAKE 1 TABLET BY MOUTH DAILY     Psychiatry:  Antidepressants - SSRI - sertraline  Failed - 01/05/2024  2:26 PM      Failed - AST in normal range and within 360 days    AST  Date Value Ref Range Status  01/02/2024 42 (H) 15 - 41 U/L Final         Passed - ALT in normal range and within 360 days    ALT  Date Value Ref Range Status  01/02/2024 25 0 - 44 U/L Final         Passed - Completed PHQ-2 or PHQ-9 in the last 360 days      Passed - Valid encounter within last 6 months    Recent Outpatient Visits           6 days ago Essential (primary) hypertension   [place] [facility] at [facility], Leita DEL, MD   3 months ago Type II diabetes mellitus with complication [facility])   [place] [facility] at [facility], Leita DEL, MD   6 months ago Acute vaginitis   [place] [facility] at [facility], Leita DEL, MD   7 months ago Essential (primary) hypertension   [place] [facility] at [facility], Leita DEL, MD               lisinopril  (ZESTRIL ) 20 MG tablet [Pharmacy Med Name: Lisinopril  20 MG Oral Tablet] 90 tablet 0    Sig: TAKE 1 TABLET BY MOUTH DAILY     Cardiovascular:  ACE Inhibitors Failed - 01/05/2024  2:26 PM      Failed - Cr in normal range and within 180 days    Creatinine  Date Value Ref Range Status  12/23/2023 1.5 (A) 0.5 - 1.1 Final  04/27/2013 1.28 0.60 - 1.30 mg/dL Final   Creatinine, Ser  Date Value Ref Range Status  01/23/2023 1.53 (H) 0.57 - 1.00 mg/dL Final   Creatinine, Urine  Date Value Ref Range Status  12/23/2023 276  Final         Passed - K in normal range and within 180 days    Potassium  Date Value Ref Range Status  12/23/2023 4.4 3.5  - 5.1 mEq/L Final  04/27/2013 4.2 3.5 - 5.1 mmol/L Final         Passed - Patient is not pregnant      Passed - Last BP in normal range    BP Readings from Last 1 Encounters:  12/30/23 122/74         Passed - Valid encounter within last 6 months    Recent Outpatient Visits           6 days ago Essential (primary) hypertension   [place] [facility] at [facility], Leita DEL, MD   3 months ago Type II diabetes mellitus with complication [facility])   [place] [facility] at [facility], Leita DEL, MD   6 months ago Acute vaginitis   [facility] at [facility], Leita DEL, MD   7 months ago Essential (primary) hypertension   [place] [facility] at [facility], Leita DEL, MD

## 2024-01-09 ENCOUNTER — Other Ambulatory Visit: Payer: Self-pay | Admitting: Internal Medicine

## 2024-01-13 NOTE — Telephone Encounter (Signed)
 Requested medication (s) are due for refill today: yes  Requested medication (s) are on the active medication list: yes  Last refill:  10/28/23  Future visit scheduled: yes  Notes to clinic:  Unable to refill per protocol, cannot delegate.      Requested Prescriptions  Pending Prescriptions Disp Refills   meclizine  (ANTIVERT ) 12.5 MG tablet [Pharmacy Med Name: MECLIZINE   12.5MG   TAB] 180 tablet 0    Sig: TAKE 1 TABLET BY MOUTH TWICE  DAILY AS NEEDED FOR DIZZINESS     Not Delegated - Gastroenterology: Antiemetics Failed - 01/13/2024  3:40 PM      Failed - This refill cannot be delegated      Passed - Valid encounter within last 6 months    Recent Outpatient Visits           2 weeks ago Essential (primary) hypertension   [facility] [facility], Leita DEL, MD   3 months ago Type II diabetes mellitus with complication [facility])   [place] [facility] at [facility], Leita DEL, MD   6 months ago Acute vaginitis   [facility] at [facility], Leita DEL, MD   7 months ago Essential (primary) hypertension   [facility] at [facility], Leita DEL, MD

## 2024-01-19 ENCOUNTER — Telehealth: Payer: Self-pay

## 2024-01-19 NOTE — Telephone Encounter (Signed)
 LVM that appt has been cancelled. Also requested patient call back to reschedule appt.   Copied from CRM #[id number]. Topic: Appointments - Scheduling Inquiry for Clinic >> Jan 16, 2024 12:25 PM Misty Villarreal wrote: Reason for CRM: pt calling requesting to resch/cancel Lung screening.

## 2024-01-20 ENCOUNTER — Ambulatory Visit

## 2024-02-02 NOTE — Progress Notes (Signed)
 Misty Villarreal                                          MRN: [id number]   02/02/2024   The [facility] Quality Team Specialist reviewed this patient medical record for the purposes of chart review for care gap closure. The following were reviewed: abstraction for care gap closure-glycemic status assessment.    [facility] Quality Team

## 2024-02-03 ENCOUNTER — Ambulatory Visit
Admission: RE | Admit: 2024-02-03 | Discharge: 2024-02-03 | Disposition: A | Discharge status: Home / Self Care | Source: Ambulatory Visit | Attending: Acute Care | Admitting: Acute Care

## 2024-02-03 DIAGNOSIS — Z87891 Personal history of nicotine dependence: Secondary | ICD-10-CM | POA: Insufficient documentation

## 2024-02-03 DIAGNOSIS — Z122 Encounter for screening for malignant neoplasm of respiratory organs: Secondary | ICD-10-CM | POA: Diagnosis present

## 2024-02-11 ENCOUNTER — Other Ambulatory Visit: Payer: Self-pay

## 2024-02-24 ENCOUNTER — Other Ambulatory Visit: Payer: Self-pay | Admitting: Internal Medicine

## 2024-02-24 ENCOUNTER — Other Ambulatory Visit: Payer: Self-pay

## 2024-02-24 DIAGNOSIS — F32A Depression, unspecified: Secondary | ICD-10-CM

## 2024-02-24 DIAGNOSIS — E118 Type 2 diabetes mellitus with unspecified complications: Secondary | ICD-10-CM

## 2024-02-24 DIAGNOSIS — I10 Essential (primary) hypertension: Secondary | ICD-10-CM

## 2024-02-24 MED ORDER — TIRZEPATIDE 10 MG/0.5ML ~~LOC~~ SOAJ: 10.0000 mg | SUBCUTANEOUS | 0 refills | Status: AC

## 2024-02-24 MED ORDER — ATORVASTATIN CALCIUM 40 MG PO TABS: 40.0000 mg | ORAL_TABLET | Freq: Every day | ORAL | 0 refills | Status: AC

## 2024-02-24 MED ORDER — LISINOPRIL 20 MG PO TABS: 20.0000 mg | ORAL_TABLET | Freq: Every day | ORAL | 0 refills | Status: AC

## 2024-02-24 MED ORDER — SERTRALINE HCL 50 MG PO TABS: 50.0000 mg | ORAL_TABLET | Freq: Every day | ORAL | 0 refills | Status: AC

## 2024-02-24 NOTE — Telephone Encounter (Signed)
 Copied from CRM #[id number]. Topic: Clinical - Medication Refill >> Feb 24, 2024 12:27 PM Yolanda T wrote: Medication: atorvastatin  (LIPITOR) 40 MG tablet, lisinopril  (ZESTRIL ) 20 MG tablet, sertraline  (ZOLOFT ) 50 MG tablet and tirzepatide  (MOUNJARO ) 10 MG/0.5ML Pen  Has the patient contacted their pharmacy? Yes  This is the patient's preferred pharmacy:   [facility] [place] - [address] [place] [address] Phone: [phone] Fax: [phone]  Is this the correct pharmacy for this prescription? Yes  Has the prescription been filled recently? Yes  Is the patient out of the medication? No  Has the patient been seen for an appointment in the last year OR does the patient have an upcoming appointment? Yes  Can we respond through MyChart? No  Agent: Please be advised that Rx refills may take up to 3 business days. We ask that you follow-up with your pharmacy.

## 2024-03-25 ENCOUNTER — Ambulatory Visit: Payer: Medicare Other

## 2024-04-07 ENCOUNTER — Encounter: Admitting: Physician Assistant

## 2024-05-24 ENCOUNTER — Ambulatory Visit (INDEPENDENT_AMBULATORY_CARE_PROVIDER_SITE_OTHER): Admitting: Vascular Surgery

## 2024-05-24 ENCOUNTER — Encounter (INDEPENDENT_AMBULATORY_CARE_PROVIDER_SITE_OTHER)
